# Patient Record
Sex: Female | Born: 1950 | Race: Black or African American | Hispanic: No | Marital: Married | Smoking: Current every day smoker
Health system: Southern US, Community
[De-identification: ages and names within clinical notes are randomized; demographics above are authoritative.]

## PROBLEM LIST (undated history)

## (undated) ENCOUNTER — Emergency Department (HOSPITAL_COMMUNITY): Payer: Medicare Other | Source: Home / Self Care

## (undated) DIAGNOSIS — Z9889 Other specified postprocedural states: Secondary | ICD-10-CM

## (undated) DIAGNOSIS — Z87442 Personal history of urinary calculi: Secondary | ICD-10-CM

## (undated) DIAGNOSIS — M1990 Unspecified osteoarthritis, unspecified site: Secondary | ICD-10-CM

## (undated) DIAGNOSIS — R112 Nausea with vomiting, unspecified: Secondary | ICD-10-CM

## (undated) DIAGNOSIS — T8859XA Other complications of anesthesia, initial encounter: Secondary | ICD-10-CM

## (undated) DIAGNOSIS — J189 Pneumonia, unspecified organism: Secondary | ICD-10-CM

## (undated) DIAGNOSIS — T4145XA Adverse effect of unspecified anesthetic, initial encounter: Secondary | ICD-10-CM

## (undated) DIAGNOSIS — T7840XA Allergy, unspecified, initial encounter: Secondary | ICD-10-CM

## (undated) HISTORY — PX: TONSILLECTOMY: SUR1361

## (undated) HISTORY — PX: TUBAL LIGATION: SHX77

## (undated) HISTORY — DX: Allergy, unspecified, initial encounter: T78.40XA

## (undated) HISTORY — PX: SPINE SURGERY: SHX786

## (undated) HISTORY — PX: CYST EXCISION: SHX5701

## (undated) NOTE — Unmapped External Note (Signed)
 Formatting of this note might be different from the original. 2120--[employer] called in regards to this patient, call transferred to Dr. Cherly Electronically signed by Tillman Minder at 05/04/2024  6:21 PM PDT

## (undated) NOTE — Progress Notes (Signed)
 Formatting of this note is different from the original. HOSPITALIST PROGRESS NOTE  Name:   Julie Obrien MRN:   [id number]  DOB:   1951/07/07 Acct:   [id number]  Age:   86 y.o. Admit Date:   05/04/2024  8:42 PM  Sex:   female Attending:   Gordy JONETTA Blanch, DO    Location:   [facility] TELEMET[facility]   Length of Stay: 0  Date of service: 05/05/2024   Subjective   Patient seen at bedside. She states that she currently has no numbness or tingling in her extremities.  Upon further enquiry, she states that she occasionally experiences right hip pain as well as tightness in the region of her right hip and right buttock. She is an active smoker. She denies having vertigo, chest pain, dizziness, headache or fever.  Current Medications:    Scheduled Meds   aspirin  81 mg Oral Daily   atorvastatin   40 mg Oral HS   enoxaparin  (LOVENOX ) injection  40 mg Subcutaneous Q24H SCH   pantoprazole   40 mg Oral QAM AC   Infusing Meds   PRN Meds  acetaminophen , 650 mg, Q6H PRN polyethylene glycol, 17 g, Daily PRN     Objective   Vitals    Current   24 Hour Min / Max  Temp 97.9 F (36.6 C) Temp  Min: 97.9 F (36.6 C)  Max: 98.1 F (36.7 C)  BP 115/67  BP  Min: 115/67  Max: 165/86  Pulse 73 Pulse  Min: 58  Max: 73  Resp 18 Resp  Min: 15  Max: 20  SpO2 93 % SpO2  Min: 93 %  Max: 97 %  Weight 80.1 kg (176 lb 9.4 oz) Initial Weight 80.1 kg (176 lb 9.4 oz)                    Body mass index is 33.37 kg/m.   Physical Examination   Physical Exam Constitutional:      General: She is not in acute distress.    Appearance: Normal appearance. She is obese. She is not ill-appearing.  HENT:     Head: Normocephalic and atraumatic.     Nose: Nose normal.     Mouth/Throat:     Mouth: Mucous membranes are moist.     Pharynx: Oropharynx is clear.  Eyes:     General: No scleral icterus.    Extraocular Movements: Extraocular movements intact.     Conjunctiva/sclera: Conjunctivae normal.     Pupils: Pupils  are equal, round, and reactive to light.  Neck:     Vascular: No carotid bruit.  Cardiovascular:     Rate and Rhythm: Normal rate and regular rhythm.     Pulses: Normal pulses.     Heart sounds: Normal heart sounds. No murmur heard.    No friction rub. No gallop.  Pulmonary:     Effort: Pulmonary effort is normal. No respiratory distress.     Breath sounds: Normal breath sounds. No stridor. No wheezing, rhonchi or rales.  Chest:     Chest wall: No tenderness.  Abdominal:     General: Bowel sounds are normal. There is no distension.     Palpations: Abdomen is soft. There is no mass.     Tenderness: There is no abdominal tenderness. There is no right CVA tenderness, left CVA tenderness, guarding or rebound.     Hernia: No hernia is present.  Musculoskeletal:        General: No  swelling, tenderness, deformity or signs of injury. Normal range of motion.     Cervical back: Normal range of motion and neck supple. No rigidity or tenderness.     Right lower leg: No edema.     Left lower leg: No edema.  Lymphadenopathy:     Cervical: No cervical adenopathy.  Skin:    General: Skin is warm.     Capillary Refill: Capillary refill takes less than 2 seconds.     Coloration: Skin is not jaundiced or pale.     Findings: No bruising or erythema.  Neurological:     General: No focal deficit present.     Mental Status: She is alert. Mental status is at baseline. She is disoriented.     Cranial Nerves: No cranial nerve deficit.     Sensory: No sensory deficit.     Motor: No weakness.     Coordination: Coordination normal.     Gait: Gait normal.  Psychiatric:        Mood and Affect: Mood normal.        Behavior: Behavior normal.        Thought Content: Thought content normal.        Judgment: Judgment normal.     Select Labs (last 7 days):     ABG   Anemia   CBC  Results from last 7 days  Lab Units 05/04/24 2213  WBC AUTO 10*3/uL 8.50  RBC AUTO 10*6/uL 4.63  HEMOGLOBIN g/dL 87.2   HEMATOCRIT % 62.4  MCV fL 81.1*  MCH pg 27.4*  MCHC g/dL 66.2  RDW % 85.3  PLATELETS AUTO 10*3/uL 275  MPV fL 7.7  NEUTROPHILS RELATIVE % 48.0  LYMPHOCYTES RELATIVE % 45.0  MONOCYTES RELATIVE % 4.9  EOSINOPHILS RELATIVE % 1.5  BASOPHILS RELATIVE % 0.6  NEUTROS ABS 10*3/uL 4.1  LYMPHS ABS AUTO 10*3/uL 3.8*  MONOS ABS AUTO 10*3/uL 0.4  EOS ABS AUTO 10*3/uL 0.1  BASOS ABS AUTO 10*3/uL 0.1   CMP  Results from last 7 days  Lab Units 05/04/24 2323  SODIUM mmol/L 139  POTASSIUM mmol/L 3.8  CHLORIDE mmol/L 103  CO2 mmol/L 24.0  BUN mg/dL 8.0  CREATININE mg/dL 9.39  BUN / CREAT RATIO  13.3  GLOMERULAR FILTRATION RATE mL/min/1.65m*2 >90.0  AST U/L 20  ALT U/L 16  ALK PHOS U/L 94  ALBUMIN  g/dL 4.0  GLOBULIN gm/dL 2.6  BILIRUBIN TOTAL mg/dL 0.4  CALCIUM  UA mg/dL 9.1   Results from last 7 days  Lab Units 05/04/24 2323 05/04/24 2048  POC GLUCOSE mg/dL  --  894  GLUCOSE mg/dL 94  --    Cardiac   Coagulation  Results from last 7 days  Lab Units 05/04/24 2216  PROTIME seconds 10.5*  INR  0.96  PTT seconds 29.7   Thyroid  Function / HGBA1C   Urine  Results from last 7 days  Lab Units 05/04/24 2239  COLOR UA  Straw  CLARITY UA  Clear  SPEC GRAV UA  1.020  PH UA  6.0  LEUKOCYTES UA  Negative  NITRITE UA  Negative  PROTEIN UA  Negative  GLUCOSE UA  Negative  UROBILINOGEN UA mg/dL Normal  BILIRUBIN UA  Negative  BLOOD UA  Negative  KETONES UA  Negative    Micro  No results found for: BLOODCX, CATHETERCX, CLOSTD, GRAMSTAIN, MRSACULTURE, RESPCULTURE, SPUTUMCULTUR, URINECX    Imaging  X-ray Chest 1 View  Result Date: 05/04/2024 1.  Minimal left basilar atelectasis/infiltrate/scarring. Electronically signed by:  Nelwyn Hang MD  05/04/2024 11:57 PM EDT RP Workstation: [id number]  CT Head Stroke without Contrast  Addendum Date: 05/04/2024   ADDENDUM Addendum Critical results discussed with and read back by Emmitt Slice, M.D.  on  05/04/2021  at 1920 hours PST. Electronically signed by:  Nelwyn Hang MD  05/04/2024 09:22 PM EDT RP Workstation: [id number]      Assessment & Plan   Hospital Problem List: Principal Problem:   TIA (transient ischemic attack) Active Problems:   Radiculopathy   CVA (cerebral vascular accident) (CMS/HCC)  TIA Stroke mimic (radiculopathy) Normal vital and labs CT head negative for acute findings  EKG with sinus bradycardia Admitted under observation for stroke workup Stroke protocol initiated Neurology consulted, recs appreciated. Passed nursing swallow eval Aspirin 81 mg daily Lipitor 40 mg daily Pain management Tylenol . Plan: MRI of the head pending.  Smoking cessation: Counseled extensively on smoking cessation.  Chronic GERD Continue Protonix  40 mg daily  DVT prophylaxis:  Lovenox  40 mg [place] daily GI prophylaxis:  Protonix  40 mg p.o. daily  Author: ADOMAKO, STEFAN, MD   Electronically signed by Gordy JONETTA Blanch, DO at 05/14/2024  9:29 AM PDT  Associated attestation - Blanch Gordy JONETTA, DO - 05/14/2024  9:29 AM PDT Formatting of this note might be different from the original. The patient was seen and examined with the Resident/NP/PA. Vitals, labs, medications, chart and imaging were reviewed. Discussed care plan and directed management Agree with Resident/PA/NP's assessment and plan as documented, with the following corrections and additions listed below.  #Advance care planning Disease education conducted, care plan discussed, diagnoses discussed, prognosis discussed, patient is full code, patient acknowledges understanding and agree with care plan, discussed about patient clinical course and answered all questions to satisfaction. +30 minutes.

## (undated) NOTE — ED Notes (Signed)
 Formatting of this note might be different from the original. Family updated as to patient's status. Electronically signed by Awilda Balo, RN at 05/04/2024  5:55 PM PDT

## (undated) NOTE — Unmapped External Note (Signed)
 Formatting of this note might be different from the original. 2054--Dr. Fanale (tele neuro) called for Dr. Cherly, call transferred Electronically signed by Tillman Minder at 05/04/2024  5:55 PM PDT

## (undated) NOTE — Unmapped External Note (Signed)
 Formatting of this note might be different from the original. 2042--Code stroke was called per the Charge nurse 2046 Code stroke was entered into blue sky, had to wait to find out who the patient was from the charge nurse Electronically signed by Tillman Minder at 05/04/2024  5:47 PM PDT

## (undated) NOTE — ED Notes (Signed)
 Formatting of this note might be different from the original. Patient is back from CT scan. Electronically signed by Awilda Balo, RN at 05/04/2024  6:22 PM PDT

## (undated) NOTE — Consults (Signed)
 Formatting of this note might be different from the original. [facility] Neuro Note  # Demographics Consult Type: Acute Stroke Level 1 (0-4.5 hrs)  Patient Location: Emergency Room  First Name: Julie  Last Name: Obrien  Date of Birth: 10/05/1950  Age: 89  Gender: Female  Facility: [facility]  Time of Initial Page [place] Time): 05/04/2024 20:46  First Contact with Site (Eastern Time): 05/04/2024 20:47  # HPI History: 72yo female with a history of multiple back and neck surgeries who presents for evaluation of persistent headache and new-onset right-sided numbness and weakness. The headache began on Friday prior to traveling from [place]  to [place]  and has persisted since arrival.  Today, she developed numbness in her right arm and leg, as well as twitching above the right elbow, which she reports as a new symptom and has never experienced before. She was able to transfer from a chair to a wheelchair with assistance but demonstrated noticeable right-sided weakness. She attempted to alleviate her symptoms with a heating pad and Tylenol  Arthritis, but neither provided relief.  Last Known Normal: saturday during the night some time  Duration: - constant  Associated Symptoms: - no double vision - no dizziness  # Scores Time of exam and NIHSS (Eastern Time): 05/04/2024 20:47  Level of Consciousness 1a: [0] = Alert; keenly responsive  LOC Questions 1b: [0] = Answers both questions correctly  LOC Commands 1c: [0] = Performs both tasks correctly  Best Gaze 2: [0] = Normal  Visual 3: [0] = No visual loss  Facial Palsy 4: [0] = Normal symmetrical movements  Motor Arm Left 5a: [0] = No drift  Motor Arm Right 5b: [0] = No drift  Motor Leg Left 6a: [0] = No drift  Motor Leg Right 6b: [0] = No drift  Limb Ataxia 7: [0] = Absent  Sensory 8: [1] = Mild-to-moderate sensory loss  Best Language 9: [0] = No aphasia  Dysarthria 10:  [0] = Normal  Extinction and Inattention 11: [0] = No abnormality  NIHSS Total: 1  # Exam Time of Exam (Eastern Time): 05/04/2024 20:47  Vitals: vital signs reviewed  SBP: 165  DBP: 86  # ROS Cardiovascular: - no chest pain - no palpitations  # PMH-FH-SH Past Medical History: - hyperlipidemia back pain  Social History: - smoker - non-drinker  Medications: - No antithrombotics or anticoagulants reported  # Assessment Impression: - Ischemic Stroke (Acute)  # Plan Thrombolytic/Intervention: NOT IV Thrombolysis or IA Intervention candidate  Thrombolytic Exclusion: > 4.5 hours  Intraarterial Exclusion: - clinical exam not consistent with presence of large vessel occlusion (LVO), can reconsider if LVO found on vascular imaging  Target Blood Pressure: SBP < 220  Labs: - lipid panel  Imaging: (urgency: STAT): - CT Angiogram Head and CT Angiogram Neck AND call back with results if abnormal  Imaging: (urgency: routine): - MRI Brain without contrast  Diagnostic Test: - echo without bubble study  Therapy/Evaluation: - PT/OT evaluation - speech/swallow consultation - NPO until swallow evaluation  Medication: - aspirin 81 mg daily  Other: - If patient has any neurological deterioration please call me back immediately - LDL < 70 - telemetry monitoring - I have discussed my recommendations with the referring provider - permissive hypertension  Disposition: admit  # Logistics Attestation of consult completion: The patient is located at: [facility]. Facility staff participated in the visit. I performed this telemedicine visit from my offsite office utilizing interactive 2 way  audio and Primary school teacher at the request of the onsite emergency room provider.  # Demographics First Name: Julie  Last Name: Obrien  Facility: [facility]   Electronically signed by Lonni Delsa GAILS, MD at  05/04/2024  5:54 PM PDT

## (undated) NOTE — Unmapped External Note (Signed)
 Formatting of this note might be different from the original.  Problem: Pain Goal: Patient's pain/discomfort is manageable Description: Assess and monitor patient's pain using appropriate pain scale. Collaborate with interdisciplinary team and initiate plan and interventions as ordered. Re-assess patient's pain level 30 - 60 minutes after pain management intervention.  Outcome: Progressing  Problem: Safety Goal: Patient will be injury free during hospitalization Description: Assess and monitor vitals signs, neurological status including level of consciousness and orientation. Assess patient's risk for falls and implement fall prevention plan of care and interventions per hospital policy.   Ensure arm band on, uncluttered walking paths in room, adequate room lighting, call light and overbed table within reach, bed in low position, wheels locked, side rails up per policy, and non-skid footwear provided.  Outcome: Progressing  Problem: Daily Care Goal: Daily care needs are met Description: Assess and monitor ability to perform self care and identify potential discharge needs. Outcome: Progressing  Problem: Psychosocial Needs Goal: Demonstrates ability to cope with hospitalization/illness Description: Assess and monitor patients ability to cope with his/her illness. Outcome: Progressing Goal: Collaborate with patient/family/caregiver to identify patient specific goals for this hospitalization Outcome: Progressing  Electronically signed by Heron Cleverly, RN at 05/05/2024 12:18 AM PDT

## (undated) NOTE — Nursing Note (Signed)
 Formatting of this note might be different from the original. Pt MRI screening form is complete and is in the pts chart. Will continue with plan of care  Electronically signed by Ashley Pickett, LVN at 05/05/2024  8:57 AM PDT

## (undated) NOTE — Consults (Signed)
 Formatting of this note might be different from the original. Teleneurology: Subjective  History of Present Illness Julie Obrien, a patient visiting from [place] , presents with a chief complaint of a right-sided headache accompanied by right-sided twitching and right foot numbness with tingling around the ankle. The patient reports that these symptoms occurred recently and have since improved.  The patient describes the onset of symptoms as a transient neurological event. The headache was localized to the right side, with associated right-sided twitching. The patient also experienced numbness in the right foot, specifically noting tingling around the ankle area. The duration and severity of these symptoms were not explicitly stated, but the patient confirms that the symptoms have improved since their occurrence.  Julie Obrien denies any history of regular headaches or other neurological symptoms such as dizziness, spinning sensations, neck pain, or back pain. The patient also denies any left-sided problems. There is no reported history of diabetes. The patient mentions being in town to visit family for Thanksgiving, suggesting recent travel which may be relevant to the current health situation.  Social History: 1. Living Situation: Visiting daughter and grandchildren 2. Residence: Lives in [place], [place]   Review of Systems: 1. HEENT: Positive for headache. 2. Neurological: Positive for right-sided twitching, right foot numbness, tingling around the ankle. Negative for dizziness, spinning sensation. 3. Musculoskeletal: Negative for neck pain, back pain.  Objective  Physical Examination Neurological: Finger-to-nose test unremarkable bilaterally. No satelliting observed. No drift noted. Gait unremarkable as patient walked to the bed.  Laboratory, Imaging, and Diagnostic Test Results: - CT angiogram: Slight narrowing of blood vessels on the right side, overall good blood flow to the brain - CT  scan of the brain: Looks good  Assessment & Plan  Julie Obrien is a patient visiting from [place]  presenting with right-sided headache, right-sided twitching, and right foot numbness and tingling around the ankle.  Transient Neurological Symptoms Assessment: Patient experienced transient neurological symptoms including right-sided headache, right-sided twitching, and right foot numbness with tingling around the ankle. Symptoms have improved since onset. CT angiogram showed slight narrowing of blood vessels on the right side, which does not correlate with the patient's right-sided symptoms. CT scan of the brain was reported as normal. Given the patient's age and symptom presentation, a transient ischemic attack (TIA) was considered in the differential diagnosis. However, imaging studies do not fully support this diagnosis. Neurological exam, including finger-to-nose test and gait assessment, was unremarkable. Plan: 1) Perform MRI of the brain 2) Continue all home medications 3) Pending normal MRI results, consider discharge this evening 4) Follow up with primary care physician in [place]  for further evaluation and management  Dr. Billy ( Total Time 15 minutes )   Electronically signed by Filiberto Billy GAILS, MD at 05/05/2024  6:26 AM PDT

## (undated) NOTE — Unmapped External Note (Signed)
 Formatting of this note might be different from the original. CM attempted to do IA ,patient is off the unit at this time . Will try again later. Electronically signed by Creighton Pares at 05/05/2024  1:01 PM PDT

## (undated) NOTE — ED Notes (Signed)
 Formatting of this note might be different from the original. Sent for CT Electronically signed by Awilda Balo, RN at 05/04/2024  5:55 PM PDT

## (undated) NOTE — H&P (Signed)
 Formatting of this note is different from the original. History & Physical   Name:  Julie Obrien MRN:  [id number]   DOB:  Feb 14, 1951 Acct:  [id number]   Age:  50 y.o. Admit Date:  05/04/2024  8:42 PM  Sex:  female  Attending:  Adriana JINNY Serge, MD  PCP: PCP, LENNOX, MD Location:  [facility]   Information Obtained From:    patient  Chief Complaint:      Right foot numbness Chief Complaint  Patient presents with   Right foot numbness    History of Present Illness   Julie Obrien is a 60 y.o. female with PMHx of hyperlipidemia, GERD, chronic back pain, prior back and neck surgeries, and right hip surgery who presents with acute right foot numbness. She reports that on Friday she developed a headache followed by transient pain in her right hand, which resolved spontaneously.  Subsequently she began to notice weakness in her right leg along with numbness in her right foot.  She endorses chronic baseline weakness of her right leg since the prior back and neck surgeries, but states the current numbness was new.  She denies slurred speech, facial droop, vision changes, dizziness, loss of consciousness, or chest pain.  Patient reports mild intermittent dry cough which she states is seasonal allergies. At the time of my encounter the patient denies headache weakness or numbness of right side.  Patient endorses home meds include atorvastatin  40 mg daily and a PPI for GERD.  Patient History  Medical:  Hyperlipidemia, chronic back pain, GERD  Surgical:  Laminectomy with posterior lateral arthrodesis                                       2022 Right hip surgery  Family:  No family history on file.  No family status information on file.   Social:  She  reports that she has been smoking cigarettes. She does not have any smokeless tobacco history on file. She reports that she does not drink alcohol and does not use drugs.     Prescriptions Prior to Arrival   No medications prior to admission.     Allergies  No Known Allergies    Review of Systems  Constitutional:  Negative for fever or chills,   HENT:  Negative for sore throat or rhinorrhea  Eyes:  Negative for pain or redness  Respiratory:  Negative for cough or SOB  Cardiovascular:  Negative for chest pain or palpitations  Gastrointestinal:  Negative for nausea or vomiting  Genitourinary:  Negative for dysuria or hematuria  Musculoskeletal:  Negative for acute back or neck pain  Skin:  Negative for rash or pruritus  Heme: No abnormal bruising, no abnormal bleeding  Neurological: Positive for headache (now resolved), transient right hand pain, right foot numbness, right leg weakness. Negative for acute numbness or weakness     Physical Examination  General:  Alert, cooperative, no acute distress  HEENT:   Normocephalic, atraumatic, bilateral external ears normal, PERRLA, conjunctiva/corneas clear, mucous membranes normal limits, oropharynx moist, no oral exudates  Neck:  Normal range of motion, supple, no tenderness, no JVD, no lymphadenopathy  Respiratory:  Normal rate, normal air flow, no respiratory distress, no retractions, no wheezes, no rhonchi, no rales  Heart:   Regular rate and rhythm, normal S1,S2, no gallops, no murmurs, no rubs  Abdomen:  Soft, no tenderness, no guarding, no  rebound  GI:   Bowel sounds normal, no masses, no pulsatile masses  Musc/Skel:  Intact distal pulses, no tenderness, no cyanosis  Skin:   No rash or open wounds  Extremities:   Bilateral pedal pulse +2, no edema  Neurologic:   Alert & oriented x 4. 5/5 strength in left UE and LE. 5/5 in right UE. 4/5 in right LL. Normal sensory function, no focal deficits     Vitals     Current    24 Hour Min / Max  Temp  98.1 F (36.7 C) Temp  Min: 98.1 F (36.7 C)  Max: 98.1 F (36.7 C)  BP  (!) 125/58 BP  Min: 125/58  Max: 165/86  Pulse  58 Pulse  Min: 58  Max: 66  Resp  18 Resp  Min: 15  Max: 20  SpO2 95 % SpO2  Min: 93 %  Max: 96 %   Weight 80.1 kg (176 lb 9.4 oz) Initial Weight 80.1 kg (176 lb 9.4 oz)                   Body mass index is 33.37 kg/m.    Current Medications:    Scheduled Meds   Infusing Meds   No current facility-administered medications for this encounter.   PRN Meds    Intake / Output  I/O     None                       Net IO Since Admission: No IO data has been entered for this period [05/05/24 0341]    Vent Settings     Select Labs (last 7 days):     ABG   Anemia   CBC  Results from last 7 days  Lab Units 05/04/24 2213  WBC AUTO 10*3/uL 8.50  RBC AUTO 10*6/uL 4.63  HEMOGLOBIN g/dL 87.2  HEMATOCRIT % 62.4  MCV fL 81.1*  MCH pg 27.4*  MCHC g/dL 66.2  RDW % 85.3  PLATELETS AUTO 10*3/uL 275  MPV fL 7.7  NEUTROPHILS RELATIVE % 48.0  LYMPHOCYTES RELATIVE % 45.0  MONOCYTES RELATIVE % 4.9  EOSINOPHILS RELATIVE % 1.5  BASOPHILS RELATIVE % 0.6  NEUTROS ABS 10*3/uL 4.1  LYMPHS ABS AUTO 10*3/uL 3.8*  MONOS ABS AUTO 10*3/uL 0.4  EOS ABS AUTO 10*3/uL 0.1  BASOS ABS AUTO 10*3/uL 0.1   CMP  Results from last 7 days  Lab Units 05/04/24 2323  SODIUM mmol/L 139  POTASSIUM mmol/L 3.8  CHLORIDE mmol/L 103  CO2 mmol/L 24.0  BUN mg/dL 8.0  CREATININE mg/dL 9.39  BUN / CREAT RATIO  13.3  GLOMERULAR FILTRATION RATE mL/min/1.43m*2 >90.0  AST U/L 20  ALT U/L 16  ALK PHOS U/L 94  ALBUMIN  g/dL 4.0  GLOBULIN gm/dL 2.6  BILIRUBIN TOTAL mg/dL 0.4  CALCIUM  UA mg/dL 9.1   Results from last 7 days  Lab Units 05/04/24 2323 05/04/24 2048  POC GLUCOSE mg/dL  --  894  GLUCOSE mg/dL 94  --    Cardiac   Coagulation  Results from last 7 days  Lab Units 05/04/24 2216  PROTIME seconds 10.5*  INR  0.96  PTT seconds 29.7   Drug Tox Screen   Thyroid  Function / HGBA1C   Urine  Results from last 7 days  Lab Units 05/04/24 2239  COLOR UA  Straw  CLARITY UA  Clear  SPEC GRAV UA  1.020  PH UA  6.0  LEUKOCYTES UA  Negative  NITRITE UA  Negative  PROTEIN UA  Negative   GLUCOSE UA  Negative  UROBILINOGEN UA mg/dL Normal  BILIRUBIN UA  Negative  BLOOD UA  Negative  KETONES UA  Negative    COVID-19  No results found for requested labs within last 30 days.     Imaging  X-ray Chest 1 View  Result Date: 05/04/2024 Procedure:  XR CHEST 1 VIEW Indication:  Stroke. Technique:  One view. Prior relevant exam:  None. Findings: Limitations: None Tubes/Lines/Catheters: None Hemidiaphragms: Normal Lungs: Minimal left basilar atelectasis/infiltrate/scarring. Heart: Normal size Mediastinum: Normal size without mass Vascularity: Normal Midline: No abnormal mass or soft tissue density. No tracheal deviation. Pneumothorax: None Soft tissues: Normal Skeletal: Nothing acute Misc: NA   1.  Minimal left basilar atelectasis/infiltrate/scarring. Electronically signed by:  Nelwyn Hang MD  05/04/2024 11:57 PM EDT RP Workstation: [id number]  CT STROKE Angiogram Head Neck with and/or without Contrast  Result Date: 05/04/2024 Procedure: CT STROKE ANGIOGRAM HEAD NECK W AND/OR WO CONTRAST Indication: CVA. Mental status changes. . Technique: CT angiogram of the head and neck performed in arterial phase after administration of 100 cc Omnipaque  356  IV. Multiplanar reconstructions. At a separate computer workstation, computer-generated multiplanar maximum intensity projections, 3d rotating surface shaded rendered views, and 3d rotating maximum intensity projections. Protocol also includes initial noncontrast head CT. Stenosis measurement and validation using NASCET technique. This exam was performed according to our departmental dose-optimization program, which includes automated exposure control, adjustment of the mA and/or kV according to patient size and/or use of iterative reconstruction technique. The VIZ A.I. study has been analyzed with a deep learning artificial intelligence algorithm for Large Vessel Occlusion Detection. Prior Relevant Exam: None Findings: Limitations: NA  Noncontrast CT Brain: No acute brain parenchymal abnormality Postcontrast CT Brain: No enhancing mass or lesion CTA HEAD Intracranial ICAs: Mild calcification bilaterally . ACAs: Normal. MCAs: Areas of mild narrowing involving the right M1 segment of the middle cerebral artery, not hemodynamically significant. SABRA PCAs: Normal. Basilar artery: Normal. Distal vertebral arteries: Normal. CTA NECK Right side Right CCA: Normal. Right carotid bulb: Mild calcification . Right cervical ICA: Normal. Right cervical vertebral artery: Normal. Left side Left CCA: Normal. Left carotid bulb: Mild calcification . Left cervical ICA: Normal. Left cervical vertebral artery: Normal. Misc: NA CONCLUSION: 1.  Areas of mild narrowing involving the right M1 segment of the middle cerebral artery, not hemodynamically significant. 2.  No evidence of intraluminal thrombus, aneurysm, or dissection. Electronically signed by:  Nelwyn Hang MD  05/04/2024 09:44 PM EDT RP Workstation: [id number]  CT Head Stroke without Contrast  Addendum Date: 05/04/2024   ADDENDUM Addendum Critical results discussed with and read back by Emmitt Slice, M.D.  on 05/04/2021  at 1920 hours PST. Electronically signed by:  Nelwyn Hang MD  05/04/2024 09:22 PM EDT RP Workstation: [id number]   Result Date: 05/04/2024 Procedure: CT HEAD STROKE PROTOCOL INDICATION: Mental status changes. Technique: Axial, coronal, sagittal, without contrast. This exam was performed according to our departmental dose-optimization program which includes automated exposure control, adjustment of the mA and/or kV according to patient size and/or use of iterative reconstruction technique. Prior relevant examination: None Findings: Limitations: None Mild central and cortical atrophy is noted. No evidence of acute brain parenchymal abnormality is noted. Ventricles are within normal limits in size. No mass-effect or midline shift is noted. No evidence of acute hemorrhage is noted.  The mastoid air cells are well aerated bilaterally. The visualized paranasal sinuses appear well-aerated bilaterally. Calvarium intact.  CONCLUSION: 1. No evidence of acute brain parenchymal abnormality. 2. ASPECT Score = 10. Electronically signed by:  Nelwyn Hang MD  05/04/2024 09:17 PM EDT RP Workstation: [id number]   Assessment & Plan   Hospital Problem List: Principal Problem:   TIA (transient ischemic attack) Active Problems:   Radiculopathy  TIA Stroke mimic (radiculopathy) Normal vital and labs CT head negative for acute findings  EKG with sinus bradycardia Admit under observation for stroke workup Stroke protocol initiated Neurology consulted Passed nursing swallow eval Aspirin 81 mg daily Lipitor 40 mg daily MRI in a.m. Neuro checks q.6 hours Continuous telemetry PT/OT for mobility and baseline functional assessment Pain management Tylenol  Check TSH, Vitamin B12, MRI cervical and lumbar spine   Chronic GERD Continue Protonix  40 mg daily  DVT prophylaxis:  Lovenox  40 mg [place] daily GI prophylaxis:  Protonix  40 mg p.o. daily  Summary: 38 year-old female with PMHx of HLD, GERD, chronic back pain, presenting with acute neurologic symptoms; headache and and pain now resolved. Ongoing right foot numbness, and subjective worsening of chronic right leg weakness.  CT head negative for acute intracranial process.  Presentation concerning for TIA versus radiculopathy.  Patient now admitted under observation for stroke workup.    Author:   RINALDO ARNETTA BIRCH, MD   Electronically signed by Adriana JINNY Serge, MD at 05/05/2024  9:50 AM PDT  Associated attestation - Serge Adriana JINNY, MD - 05/05/2024  9:50 AM PDT Formatting of this note is different from the original. The patient was seen and examined with the NP/PA. Vitals, labs, medications, chart and imaging were reviewed. Discussed care plan and directed management Agree with NP's assessment and plan as documented,  with the following corrections and additions.  I  spent 50 minutes with the patient w/ >50% of the time spent counseling and/or coordinating care for this patient. Counseling topics and/or how time was spent coordinating patient?s care is outlined in the impression and plan above. Advanced care planning conducted. Pt instructed to F/U with PCP within 1 wk.

## (undated) NOTE — Nursing Note (Signed)
 Formatting of this note might be different from the original. Pt went AMA. States there is no medical reason for her to be in the hospital. IV removed and tele box return to the monitor room.  Electronically signed by Ashley Pickett, LVN at 05/05/2024  2:19 PM PDT

## (undated) NOTE — Unmapped External Note (Signed)
 Formatting of this note might be different from the original. 2049--Dr. Fanale came across the monitor, gave the option of speaking to the Dr. Or the patient first whom was in the room, Dr. Delsa chose the patient first.  Wheeled him into the room. Electronically signed by Tillman Minder at 05/04/2024  5:51 PM PDT

## (undated) NOTE — ED Notes (Signed)
 Formatting of this note might be different from the original. Neurology exam in progress, daughter at bedside  Electronically signed by Awilda Balo, RN at 05/04/2024  5:51 PM PDT

## (undated) NOTE — Unmapped External Note (Signed)
 Formatting of this note might be different from the original. 2047--Dr. Delsa Tele Neuro accepted the Code Stroke via [facility] Electronically signed by Tillman Minder at 05/04/2024  5:50 PM PDT

## (undated) NOTE — ED Provider Notes (Signed)
 Formatting of this note is different from the original. Emergency Department Encounter Name:   Julie Obrien MRN:   [id number]  DOB:   09-Dec-1950 Acct:   [id number]  Age:   22 y.o. Arrival:   05/04/24 at 2016  Sex:   female ED Provider:   Emmitt DELENA Slice, MD  PCP:  PCP, LENNOX, MD Location:   [facility] EMERGENCY   Chief Complaint Chief Complaint  Patient presents with   Cerebrovascular Accident   HPI 75 y.o. female presents to the emergency room with complaint of right-sided ankle/foot numbness .  Patient claims numbness associated with slight weakness which started about 8 hours prior to presentation to the emergency room.  Patient denies any loss of consciousness.  Denies trauma denies chest pain, shortness of breath or any other constitutional symptoms.  Code Status:   Reviewed with patient and/or family as full code.  ROS Review of Systems  Constitutional: Negative.   HENT: Negative.    Eyes: Negative.   Respiratory: Negative.    Cardiovascular: Negative.   Gastrointestinal: Negative.   Endocrine: Negative.   Musculoskeletal: Negative.   Skin: Negative.   Allergic/Immunologic: Negative.   Neurological:  Positive for numbness.  Hematological: Negative.   Psychiatric/Behavioral: Negative.    All other systems reviewed and are negative. Genitourinary: Negative.     PHYSICAL EXAM BP 136/70   Pulse 65   Temp 98.1 F (36.7 C)   Resp 20   SpO2 95%  Vitals:   05/04/24 2136 05/04/24 2140 05/04/24 2145 05/04/24 2200  BP: 129/64 129/64 128/74 136/70  BP Location:      Patient Position:      Pulse:  60 60 65  Resp:  20 18 20   Temp:  98.1 F (36.7 C)    SpO2:  95% 95% 95%   Physical Exam Vitals and nursing note reviewed.  Constitutional:      Appearance: Normal appearance.  HENT:     Head: Normocephalic and atraumatic.     Nose: Nose normal.  Eyes:     Extraocular Movements: Extraocular movements intact.     Pupils: Pupils are equal, round, and reactive to light.   Cardiovascular:     Rate and Rhythm: Normal rate and regular rhythm.  Pulmonary:     Effort: Pulmonary effort is normal.     Breath sounds: Normal breath sounds.  Abdominal:     General: Abdomen is flat. Bowel sounds are normal.     Palpations: Abdomen is soft.  Musculoskeletal:        General: Normal range of motion.     Cervical back: Normal range of motion and neck supple.  Skin:    General: Skin is warm.  Neurological:     Mental Status: She is alert.     GCS: GCS eye subscore is 4. GCS verbal subscore is 5. GCS motor subscore is 6.     Cranial Nerves: Cranial nerves 2-12 are intact.     Sensory: Sensory deficit present.     Motor: Weakness present.     Coordination: Coordination is intact.     Comments: NIH:1   PAST MEDICAL HISTORY No past medical history on file.  SURGICAL HISTORY No past surgical history on file.  CURRENT MEDICATIONS  Previous Medications   No medications on file   ALLERGIES No Known Allergies  FAMILY HISTORY No family history on file.  SOCIAL HISTORY Social History   Socioeconomic History   Marital status: Unknown    Spouse name: Not  on file   Number of children: Not on file   Years of education: Not on file   Highest education level: Not on file  Occupational History   Not on file  Tobacco Use   Smoking status: Not on file   Smokeless tobacco: Not on file  Substance and Sexual Activity   Alcohol use: Not on file   Drug use: Not on file   Sexual activity: Not on file  Other Topics Concern   Not on file  Social History Narrative   Not on file   Social Determinants of Health   Financial Resource Strain: Low Risk  (02/09/2024)   Received from [facility]   Overall Financial Resource Strain (CARDIA)    How hard is it for you to pay for the very basics like food, housing, medical care, and heating?: Not hard at all  Food Insecurity: No Food Insecurity (02/09/2024)   Received from [facility]   Hunger Vital Sign    Worried  About Running Out of Food in the Last Year: Never true    Ran Out of Food in the Last Year: Never true  Transportation Needs: No Transportation Needs (02/09/2024)   Received from [facility] - Transportation    In the past 12 months, has lack of transportation kept you from medical appointments or from getting medications?: No    In the past 12 months, has lack of transportation kept you from meetings, work, or from getting things needed for daily living?: No  Physical Activity: Inactive (02/09/2024)   Received from [facility] - Eat   Exercise Vital Sign    Days of Exercise per Week: 0 days    Minutes of Exercise per Session: Not on file  Stress: No Stress Concern Present (02/09/2024)   Received from [facility] - Occupational Stress Questionnaire    Do you feel stress - tense, restless, nervous, or anxious, or unable to sleep at night because your mind is troubled all the time - these days?: Not at all  Social Connections: Moderately Isolated (02/09/2024)   Received from [facility]   Social Connection and Isolation Panel [NHANES]    Frequency of Communication with Friends and Family: More than three times a week    Frequency of Social Gatherings with Friends and Family: More than three times a week    Attends Religious Services: Never    Database administrator or Organizations: No    Attends Engineer, structural: Not on file    Marital Status: Married  Catering manager Violence: Not At Risk (07/31/2023)   Received from [facility]   Humiliation, Afraid, Rape, and Kick questionnaire    Fear of Current or Ex-Partner: No    Emotionally Abused: No    Physically Abused: No    Sexually Abused: No  Housing Stability: Unknown (02/09/2024)   Received from [facility]   Housing Stability Vital Sign    Unable to Pay for Housing in the Last Year: No    Number of Times Moved in the Last Year: Not on file    Homeless in the Last Year: No    Labs Labs Reviewed  CBC W/ AUTO DIFF - Abnormal      Result Value   WBC 8.50     RBC 4.63     Hemoglobin 12.7     Hematocrit 37.5     MCV 81.1 (*)    MCH 27.4 (*)  MCHC 33.7     RDW 14.6     Platelets 275     MPV 7.7     nRBC 0.10     Neutrophils (Relative) 48.0     Lymphocytes (Relative) 45.0     Monocytes (Relative) 4.9     Eosinophils (Relative) 1.5     Basophils (Relative) 0.6     Neutrophils (Absolute) 4.1     Lymphocytes (Absolute) 3.8 (*)    Monocytes (Absolute) 0.4     Eosinophils (Absolute) 0.1     Basophils (Absolute) 0.1     MDW 19.56    PT AND PTT - Abnormal   PTT 29.7     Protime 10.5 (*)    INR 0.96    LIPID PANEL - Normal   Cholesterol 157     Triglycerides 65     HDL 64.0     LDL Direct 87     Cardiac Risk 2.5     VLDL 13     Narrative:                         NATIONAL CHOLESTEROL GUIDELINES NATIONAL HEART,LUNG,and BLOOD INSTITUTE(NHLBI) guidelines for classification, testing and management of cholesterol levels in adults over 28 years of age. This new classification creates three categories of risk for coronary heart disease,regardless of age or sex,according to total and LDL cholesterol levels.    Based on total cholesterol level  Desirable               <200 mg/dl Borderline-high       799-760 mg/dl High                    >=240 mg/dl       Based on cholesterol ratio   CHD RISK           CHOL/HDL RATIO                              FEMALE       FEMALE     0.5 x Average        3.4             3.3 1.0 x Average        5.0             4.4 2.0 x Average        9.6             7.1 3.0 x Average       13.5           11.0  URINALYSIS, W/ REFLEX TO MICRO AND CULT, IF INDICATED - Normal   Color, UA Straw     Clarity, UA Clear     Specific Gravity, UA 1.020     pH, UA 6.0     Protein, UA Negative     Glucose, UA Negative     Ketones, UA Negative     Blood, UA Negative     Bilirubin, UA Negative     Nitrite, UA Negative     Leukocyte  esterase, UA Negative     Urobilinogen, UA Normal    POCT GLUCOSE - Normal   POC Glucose 105    COMPREHENSIVE METABOLIC PANEL   Sodium 139     Potassium 3.8     Chloride 103     CO2 24.0  BUN 8.0     Creatinine 0.60     BUN/Creatinine Ratio 13.3     Glucose 94     Calcium  9.1     AST 20     ALT 16     Alkaline Phosphatase 94     Total Protein 6.6     Albumin  4.0     Total Bilirubin 0.4     A/G Ratio 1.5     Anion Gap 12.0     GLOBULIN 2.6     Glomerular Filtration Rate >90.0     Narrative:    STAGES OF CHRONIC KIDNEY DISEASE  STAGE      GFR           DESCRIPTION 1           90.0+         Normal Kidney function but urine findings or structural abnormalities or genetic trait point to kidney disease.  2           60.0-89.0     Mildly reduced kidney function, and other findings (as in stage 1) point to kidney disease.  3           30.0-59.0     Moderately reduced kidney function.  4           15.0-29.0     Severely reduced kidney function.  5           <15.0         Very severe, or endstage kidney failure.  COMPREHENSIVE METABOLIC PANEL  TYPE AND SCREEN   Radiology Imaging Results       X-ray Chest 1 View (Final result)  Result time 05/04/24 23:57:40    Final result by Nelwyn LELON Hang, MD (05/04/24 23:57:40)        Impression:   1.  Minimal left basilar atelectasis/infiltrate/scarring.  Electronically signed by:  Nelwyn Hang MD  05/04/2024 11:57 PM EDT RP Workstation: [id number]       Narrative:   Procedure:  XR CHEST 1 VIEW   Indication:  Stroke.  Technique:  One view.  Prior relevant exam:  None.  Findings:  Limitations: None  Tubes/Lines/Catheters: None  Hemidiaphragms: Normal  Lungs: Minimal left basilar atelectasis/infiltrate/scarring.   Heart: Normal size  Mediastinum: Normal size without mass  Vascularity: Normal  Midline: No abnormal mass or soft tissue density. No tracheal deviation.  Pneumothorax: None  Soft  tissues: Normal  Skeletal: Nothing acute  Misc: NA               CT STROKE Angiogram Head Neck with and/or without Contrast (Final result)  Result time 05/04/24 21:44:44    Final result by Nelwyn LELON Hang, MD (05/04/24 21:44:44)        Narrative:   Procedure: CT STROKE ANGIOGRAM HEAD NECK W AND/OR WO CONTRAST   Indication: CVA. Mental status changes. .  Technique: CT angiogram of the head and neck performed in arterial phase after administration of 100 cc Omnipaque  356  IV. Multiplanar reconstructions. At a separate computer workstation, computer-generated multiplanar maximum intensity projections, 3d rotating surface shaded rendered views, and 3d rotating maximum intensity projections. Protocol also includes initial noncontrast head CT. Stenosis measurement and validation using NASCET technique. This exam was performed according to our departmental dose-optimization program, which includes automated exposure control, adjustment of the mA and/or kV according to patient size and/or use of iterative reconstruction technique.  The VIZ A.I. study has been analyzed with a deep learning  artificial intelligence algorithm for Large Vessel Occlusion Detection.  Prior Relevant Exam: None  Findings:   Limitations: NA  Noncontrast CT Brain: No acute brain parenchymal abnormality  Postcontrast CT Brain: No enhancing mass or lesion  CTA HEAD  Intracranial ICAs: Mild calcification bilaterally . ACAs: Normal. MCAs: Areas of mild narrowing involving the right M1 segment of the middle cerebral artery, not hemodynamically significant. SABRA PCAs: Normal. Basilar artery: Normal. Distal vertebral arteries: Normal.  CTA NECK  Right side Right CCA: Normal. Right carotid bulb: Mild calcification . Right cervical ICA: Normal. Right cervical vertebral artery: Normal.  Left side Left CCA: Normal. Left carotid bulb: Mild calcification . Left cervical ICA: Normal. Left cervical  vertebral artery: Normal.  Misc: NA  CONCLUSION: 1.  Areas of mild narrowing involving the right M1 segment of the middle cerebral artery, not hemodynamically significant. 2.  No evidence of intraluminal thrombus, aneurysm, or dissection.  Electronically signed by:  Nelwyn Hang MD  05/04/2024 09:44 PM EDT RP Workstation: [phone]              CT Head Stroke without Contrast (Edited Result - FINAL)  Result time 05/04/24 21:22:10    Addendum (preliminary) 1 of 1 by Nelwyn LELON Hang, MD (05/04/24 21:22:10)    ADDENDUM  Addendum  Critical results discussed with and read back by Emmitt Slice, M.D.  on 05/04/2021  at 1920 hours PST.  Electronically signed by:  Nelwyn Hang MD  05/04/2024 09:22 PM EDT RP Workstation: [id number]        Final result by Nelwyn LELON Hang, MD (05/04/24 21:17:19)        Narrative:   Procedure: CT HEAD STROKE PROTOCOL   INDICATION: Mental status changes.  Technique: Axial, coronal, sagittal, without contrast. This exam was performed according to our departmental dose-optimization program which includes automated exposure control, adjustment of the mA and/or kV according to patient size and/or use of iterative reconstruction technique.  Prior relevant examination: None  Findings:  Limitations: None  Mild central and cortical atrophy is noted. No evidence of acute brain parenchymal abnormality is noted. Ventricles are within normal limits in size. No mass-effect or midline shift is noted. No evidence of acute hemorrhage is noted.   The mastoid air cells are well aerated bilaterally.   The visualized paranasal sinuses appear well-aerated bilaterally.  Calvarium intact.  CONCLUSION:  1. No evidence of acute brain parenchymal abnormality. 2. ASPECT Score = 10.  Electronically signed by:  Nelwyn Hang MD  05/04/2024 09:17 PM EDT RP Workstation: [id number]              ------------------------------------------------------------------------------------------- MEDICAL DECISION MAKING (MDM) AND ED COURSE Stroke MDM This patient presents with symptoms concerning for acute CVA versus TIA. Other items on the differential include dissection, AMI, hypoglycemia or other metabolic derangement such as hepatic/uremic encephalopathy, medication side effect, or post-ictal Todd?s paralysis. However, presentation most concerning for a CVA. EKG without evidence of STEMI or ischemia, labs with no hypoglycemia, metabolic derangements, and clinical picture does not suggest other stroke mimic. CT head showed no significant abnormalities at present  CTA head and neck showed no significant abnormalities. Per neuro patient will be admitted for CVA workup under the hospitalist service.SABRA     -------------------------------------------------------------------------------------------  1) Number and Complexity of Problems  Problem List This Visit:  Cerebrovascular Accident  Differential Diagnosis includes (but not limited to):  CVA, encephalitis,   Pertinent Comorbid Conditions:   See HPI, PMH and PSH  Complexity of Problem  This  patient has:  >2 problem(s) addressed during this visit  One of which includes: Acute illness/ injury with systemic or complicated features  2)  Data Reviewed (none if left blank)  External Documentation Reviewed: Yes, as necessary  Previous patient encounter documents & history available on EMR was reviewed: Yes  See Formal Diagnostic Results above for the lab and radiology tests and orders.   3)  Treatment and Disposition  Morbidity/ Mortality Of Patient Management  During this visit, the patient has received:  Drug therapy requiring intensive monitoring   Medications  iohexol (OMNIPAQUE) 350 MG/ML injection 100 mL (100 mL Intravenous Given 05/04/24 2120)    Disposition: Admit to telemetry  EKG  Twelve lead EKG  obtained at 2149  interpreted by me in the absence of a cardiologist. This tracing shows a sinus bradycardia.  There is no criteria ST-elevation or reciprocal change.  There are no hyperacute T-wave changes. There is no sign of acute ischemia or infarction.  HR,PR interval and QTC interval are 57/192/422 There is no acute change compared with most recent prior dated .  COMPLEXITY OF WORKUP   I have reviewed the results of each ordered lab result.    I have reviewed prior documentation from an outside department.   I have obtained a history from an independent historian (someone other than the patient).   I have not independently viewed and interpreted the patient's radiologic imaging.   I have not discussed the management with an external physician or APP as a Research scientist (medical).   PROCEDURES: Procedures  CRITICAL CARE STATEMENT (Delete if criteria not met 120) Due to a high probability of clinically significant, life threatening deterioration, the patient required my highest level of preparedness to intervene emergently.Total critical care time is 120 minutes.  This critical care time includes obtaining a history, examining the patient, vital sign monitoring, pulse oximetry monitoring, telemetry monitoring, clinical response to the IV medications, review nursing notes, documentation time, consultation time and interpretation of laboratory tests.This critical care time was performed to assess and manage the high probability  of imminent, life-threatening deterioration that could result in multi-organ failure. It was exclusive of separately billable procedures and treating other patients.   Please see MDM section and rest of the note for further information on patient assessment and treatment.   ------------------------------------------------------------------------------------------------------------------ FINAL IMPRESSION AND DISPOSITION  1. CVA (cerebral vascular accident) (CMS/HCC)    Plan/Orders  Assosiated with Diagnosis  and ICD Codes    Diagnosis Name ICD-10-CM Plan/Orders Assosiated with Diagnosis  1. CVA (cerebral vascular accident) (CMS/HCC)  I63.9      Shared Decision-Making:  Treatment plan and disposition discussed with the patient/family, questions answered   PATIENT REFERRED TO:  DISCHARGE MEDICATIONS: New Prescriptions   No medications on file    Emmitt DELENA Slice, MD 05/05/24 [id number]   Emmitt DELENA Slice, MD 05/05/24 0230  Electronically signed by Emmitt DELENA Slice, MD at 05/04/2024 11:30 PM PDT

---

## 1997-08-14 HISTORY — PX: CYSTO: SHX6284

## 1998-04-21 ENCOUNTER — Ambulatory Visit (HOSPITAL_COMMUNITY): Admission: RE | Admit: 1998-04-21 | Discharge: 1998-04-21 | Payer: Self-pay | Admitting: Cardiology

## 1998-08-07 ENCOUNTER — Encounter: Payer: Self-pay | Admitting: *Deleted

## 1998-08-07 ENCOUNTER — Inpatient Hospital Stay (HOSPITAL_COMMUNITY): Admission: EM | Admit: 1998-08-07 | Discharge: 1998-08-12 | Payer: Self-pay | Admitting: Emergency Medicine

## 1998-08-07 ENCOUNTER — Encounter: Payer: Self-pay | Admitting: Emergency Medicine

## 1998-08-11 ENCOUNTER — Encounter: Payer: Self-pay | Admitting: Cardiology

## 1999-09-05 ENCOUNTER — Ambulatory Visit (HOSPITAL_COMMUNITY): Admission: RE | Admit: 1999-09-05 | Discharge: 1999-09-05 | Payer: Self-pay | Admitting: Internal Medicine

## 1999-09-05 ENCOUNTER — Encounter: Payer: Self-pay | Admitting: Internal Medicine

## 1999-10-17 ENCOUNTER — Encounter: Payer: Self-pay | Admitting: Internal Medicine

## 1999-10-17 ENCOUNTER — Ambulatory Visit (HOSPITAL_COMMUNITY): Admission: RE | Admit: 1999-10-17 | Discharge: 1999-10-17 | Payer: Self-pay | Admitting: Internal Medicine

## 1999-10-18 ENCOUNTER — Encounter: Payer: Self-pay | Admitting: Internal Medicine

## 1999-10-18 ENCOUNTER — Encounter: Admission: RE | Admit: 1999-10-18 | Discharge: 1999-10-18 | Payer: Self-pay | Admitting: Internal Medicine

## 1999-10-18 ENCOUNTER — Inpatient Hospital Stay (HOSPITAL_COMMUNITY): Admission: RE | Admit: 1999-10-18 | Discharge: 1999-10-25 | Payer: Self-pay | Admitting: Urology

## 1999-10-19 ENCOUNTER — Encounter: Payer: Self-pay | Admitting: Urology

## 1999-10-20 ENCOUNTER — Encounter: Payer: Self-pay | Admitting: Internal Medicine

## 1999-10-24 ENCOUNTER — Encounter: Payer: Self-pay | Admitting: Urology

## 1999-10-27 ENCOUNTER — Encounter: Admission: RE | Admit: 1999-10-27 | Discharge: 1999-10-27 | Payer: Self-pay | Admitting: Urology

## 1999-10-27 ENCOUNTER — Encounter: Payer: Self-pay | Admitting: Urology

## 2000-04-20 ENCOUNTER — Encounter: Admission: RE | Admit: 2000-04-20 | Discharge: 2000-04-20 | Payer: Self-pay | Admitting: Urology

## 2000-04-20 ENCOUNTER — Encounter: Payer: Self-pay | Admitting: Urology

## 2000-09-03 ENCOUNTER — Encounter: Payer: Self-pay | Admitting: Neurosurgery

## 2000-09-03 ENCOUNTER — Ambulatory Visit (HOSPITAL_COMMUNITY): Admission: RE | Admit: 2000-09-03 | Discharge: 2000-09-04 | Payer: Self-pay | Admitting: Neurosurgery

## 2000-10-09 ENCOUNTER — Ambulatory Visit (HOSPITAL_COMMUNITY): Admission: RE | Admit: 2000-10-09 | Discharge: 2000-10-09 | Payer: Self-pay | Admitting: Neurosurgery

## 2000-10-09 ENCOUNTER — Encounter: Payer: Self-pay | Admitting: Neurosurgery

## 2000-10-20 ENCOUNTER — Emergency Department (HOSPITAL_COMMUNITY): Admission: EM | Admit: 2000-10-20 | Discharge: 2000-10-20 | Payer: Self-pay | Admitting: Emergency Medicine

## 2000-12-18 ENCOUNTER — Encounter: Payer: Self-pay | Admitting: Neurosurgery

## 2000-12-18 ENCOUNTER — Ambulatory Visit (HOSPITAL_COMMUNITY): Admission: RE | Admit: 2000-12-18 | Discharge: 2000-12-18 | Payer: Self-pay | Admitting: Neurosurgery

## 2001-01-11 ENCOUNTER — Ambulatory Visit (HOSPITAL_COMMUNITY): Admission: RE | Admit: 2001-01-11 | Discharge: 2001-01-11 | Payer: Self-pay | Admitting: Neurosurgery

## 2001-01-12 ENCOUNTER — Encounter: Payer: Self-pay | Admitting: Neurosurgery

## 2001-01-12 ENCOUNTER — Ambulatory Visit (HOSPITAL_COMMUNITY): Admission: RE | Admit: 2001-01-12 | Discharge: 2001-01-12 | Payer: Self-pay | Admitting: Neurosurgery

## 2001-01-23 ENCOUNTER — Encounter: Admission: RE | Admit: 2001-01-23 | Discharge: 2001-04-13 | Payer: Self-pay | Admitting: Anesthesiology

## 2001-01-23 ENCOUNTER — Other Ambulatory Visit: Admission: RE | Admit: 2001-01-23 | Discharge: 2001-01-23 | Payer: Self-pay | Admitting: Obstetrics

## 2001-05-12 ENCOUNTER — Ambulatory Visit (HOSPITAL_COMMUNITY): Admission: RE | Admit: 2001-05-12 | Discharge: 2001-05-12 | Payer: Self-pay | Admitting: Pulmonary Disease

## 2001-05-12 ENCOUNTER — Encounter: Payer: Self-pay | Admitting: Pulmonary Disease

## 2001-11-18 ENCOUNTER — Encounter (INDEPENDENT_AMBULATORY_CARE_PROVIDER_SITE_OTHER): Payer: Self-pay

## 2001-11-18 ENCOUNTER — Ambulatory Visit (HOSPITAL_COMMUNITY): Admission: RE | Admit: 2001-11-18 | Discharge: 2001-11-18 | Payer: Self-pay | Admitting: Gastroenterology

## 2001-11-21 ENCOUNTER — Encounter: Payer: Self-pay | Admitting: Emergency Medicine

## 2001-11-21 ENCOUNTER — Emergency Department (HOSPITAL_COMMUNITY): Admission: EM | Admit: 2001-11-21 | Discharge: 2001-11-21 | Payer: Self-pay | Admitting: Emergency Medicine

## 2002-07-28 ENCOUNTER — Encounter: Admission: RE | Admit: 2002-07-28 | Discharge: 2002-07-28 | Payer: Self-pay | Admitting: Urology

## 2002-07-28 ENCOUNTER — Encounter: Payer: Self-pay | Admitting: Urology

## 2002-07-29 ENCOUNTER — Encounter: Payer: Self-pay | Admitting: Urology

## 2002-07-29 ENCOUNTER — Encounter: Admission: RE | Admit: 2002-07-29 | Discharge: 2002-07-29 | Payer: Self-pay | Admitting: Urology

## 2002-08-23 ENCOUNTER — Ambulatory Visit (HOSPITAL_COMMUNITY): Admission: RE | Admit: 2002-08-23 | Discharge: 2002-08-23 | Payer: Self-pay | Admitting: Neurosurgery

## 2002-08-23 ENCOUNTER — Encounter: Payer: Self-pay | Admitting: Neurosurgery

## 2002-11-28 ENCOUNTER — Encounter: Payer: Self-pay | Admitting: Emergency Medicine

## 2002-11-28 ENCOUNTER — Emergency Department (HOSPITAL_COMMUNITY): Admission: EM | Admit: 2002-11-28 | Discharge: 2002-11-28 | Payer: Self-pay | Admitting: Emergency Medicine

## 2003-01-22 ENCOUNTER — Encounter: Payer: Self-pay | Admitting: Emergency Medicine

## 2003-01-22 ENCOUNTER — Emergency Department (HOSPITAL_COMMUNITY): Admission: EM | Admit: 2003-01-22 | Discharge: 2003-01-22 | Payer: Self-pay | Admitting: Emergency Medicine

## 2003-10-05 ENCOUNTER — Encounter: Admission: RE | Admit: 2003-10-05 | Discharge: 2003-10-05 | Payer: Self-pay | Admitting: Internal Medicine

## 2003-10-08 ENCOUNTER — Encounter: Admission: RE | Admit: 2003-10-08 | Discharge: 2003-10-08 | Payer: Self-pay | Admitting: Neurosurgery

## 2003-10-10 ENCOUNTER — Ambulatory Visit (HOSPITAL_COMMUNITY): Admission: RE | Admit: 2003-10-10 | Discharge: 2003-10-10 | Payer: Self-pay | Admitting: Neurosurgery

## 2003-11-17 ENCOUNTER — Encounter: Admission: RE | Admit: 2003-11-17 | Discharge: 2003-11-17 | Payer: Self-pay | Admitting: Internal Medicine

## 2004-09-19 ENCOUNTER — Encounter: Admission: RE | Admit: 2004-09-19 | Discharge: 2004-09-19 | Payer: Self-pay | Admitting: Neurosurgery

## 2004-10-03 ENCOUNTER — Encounter: Admission: RE | Admit: 2004-10-03 | Discharge: 2004-10-03 | Payer: Self-pay | Admitting: Neurosurgery

## 2004-10-17 ENCOUNTER — Encounter: Admission: RE | Admit: 2004-10-17 | Discharge: 2004-10-17 | Payer: Self-pay | Admitting: Neurosurgery

## 2004-12-15 ENCOUNTER — Encounter: Admission: RE | Admit: 2004-12-15 | Discharge: 2004-12-15 | Payer: Self-pay | Admitting: Neurosurgery

## 2005-03-23 ENCOUNTER — Ambulatory Visit (HOSPITAL_COMMUNITY): Admission: RE | Admit: 2005-03-23 | Discharge: 2005-03-23 | Payer: Self-pay | Admitting: Orthopedic Surgery

## 2005-03-23 ENCOUNTER — Ambulatory Visit (HOSPITAL_BASED_OUTPATIENT_CLINIC_OR_DEPARTMENT_OTHER): Admission: RE | Admit: 2005-03-23 | Discharge: 2005-03-23 | Payer: Self-pay | Admitting: Orthopedic Surgery

## 2005-04-24 ENCOUNTER — Emergency Department (HOSPITAL_COMMUNITY): Admission: EM | Admit: 2005-04-24 | Discharge: 2005-04-24 | Payer: Self-pay | Admitting: Emergency Medicine

## 2005-10-24 ENCOUNTER — Emergency Department (HOSPITAL_COMMUNITY): Admission: EM | Admit: 2005-10-24 | Discharge: 2005-10-24 | Payer: Self-pay | Admitting: Emergency Medicine

## 2006-06-15 ENCOUNTER — Ambulatory Visit (HOSPITAL_BASED_OUTPATIENT_CLINIC_OR_DEPARTMENT_OTHER): Admission: RE | Admit: 2006-06-15 | Discharge: 2006-06-15 | Payer: Self-pay | Admitting: Orthopedic Surgery

## 2006-08-14 HISTORY — PX: BACK SURGERY: SHX140

## 2006-11-23 ENCOUNTER — Emergency Department (HOSPITAL_COMMUNITY): Admission: EM | Admit: 2006-11-23 | Discharge: 2006-11-23 | Payer: Self-pay | Admitting: Family Medicine

## 2007-06-29 ENCOUNTER — Encounter: Admission: RE | Admit: 2007-06-29 | Discharge: 2007-06-29 | Payer: Self-pay | Admitting: Internal Medicine

## 2007-07-08 ENCOUNTER — Encounter: Admission: RE | Admit: 2007-07-08 | Discharge: 2007-07-08 | Payer: Self-pay | Admitting: Neurosurgery

## 2007-07-26 ENCOUNTER — Encounter (INDEPENDENT_AMBULATORY_CARE_PROVIDER_SITE_OTHER): Payer: Self-pay | Admitting: Neurosurgery

## 2007-07-26 ENCOUNTER — Ambulatory Visit (HOSPITAL_COMMUNITY): Admission: RE | Admit: 2007-07-26 | Discharge: 2007-07-27 | Payer: Self-pay | Admitting: Neurosurgery

## 2007-08-15 HISTORY — PX: CERVICAL DISCECTOMY: SHX98

## 2007-09-05 ENCOUNTER — Encounter: Admission: RE | Admit: 2007-09-05 | Discharge: 2007-09-05 | Payer: Self-pay | Admitting: Neurosurgery

## 2007-10-03 ENCOUNTER — Encounter: Admission: RE | Admit: 2007-10-03 | Discharge: 2007-10-03 | Payer: Self-pay | Admitting: Neurosurgery

## 2007-11-20 ENCOUNTER — Inpatient Hospital Stay (HOSPITAL_COMMUNITY): Admission: RE | Admit: 2007-11-20 | Discharge: 2007-11-25 | Payer: Self-pay | Admitting: Neurosurgery

## 2007-12-08 ENCOUNTER — Emergency Department (HOSPITAL_COMMUNITY): Admission: EM | Admit: 2007-12-08 | Discharge: 2007-12-08 | Payer: Self-pay | Admitting: Emergency Medicine

## 2008-02-13 ENCOUNTER — Encounter: Admission: RE | Admit: 2008-02-13 | Discharge: 2008-02-13 | Payer: Self-pay | Admitting: Neurosurgery

## 2008-03-19 ENCOUNTER — Encounter: Admission: RE | Admit: 2008-03-19 | Discharge: 2008-03-19 | Payer: Self-pay | Admitting: Neurosurgery

## 2008-05-19 ENCOUNTER — Encounter: Admission: RE | Admit: 2008-05-19 | Discharge: 2008-05-19 | Payer: Self-pay | Admitting: Neurosurgery

## 2008-07-07 ENCOUNTER — Encounter: Admission: RE | Admit: 2008-07-07 | Discharge: 2008-07-07 | Payer: Self-pay | Admitting: Neurosurgery

## 2009-04-21 ENCOUNTER — Ambulatory Visit (HOSPITAL_BASED_OUTPATIENT_CLINIC_OR_DEPARTMENT_OTHER): Admission: RE | Admit: 2009-04-21 | Discharge: 2009-04-21 | Payer: Self-pay | Admitting: Orthopedic Surgery

## 2010-06-29 ENCOUNTER — Encounter: Admission: RE | Admit: 2010-06-29 | Discharge: 2010-06-29 | Payer: Self-pay | Admitting: Otolaryngology

## 2010-07-13 ENCOUNTER — Other Ambulatory Visit
Admission: RE | Admit: 2010-07-13 | Discharge: 2010-07-13 | Payer: Self-pay | Source: Home / Self Care | Admitting: Interventional Radiology

## 2010-07-13 ENCOUNTER — Encounter: Admission: RE | Admit: 2010-07-13 | Discharge: 2010-07-13 | Payer: Self-pay | Admitting: Otolaryngology

## 2010-07-25 ENCOUNTER — Encounter (INDEPENDENT_AMBULATORY_CARE_PROVIDER_SITE_OTHER): Payer: Self-pay | Admitting: Otolaryngology

## 2010-07-25 ENCOUNTER — Ambulatory Visit (HOSPITAL_COMMUNITY)
Admission: RE | Admit: 2010-07-25 | Discharge: 2010-07-26 | Payer: Self-pay | Source: Home / Self Care | Attending: Otolaryngology | Admitting: Otolaryngology

## 2010-10-24 LAB — BASIC METABOLIC PANEL WITH GFR
BUN: 11 mg/dL (ref 6–23)
CO2: 26 meq/L (ref 19–32)
Chloride: 111 meq/L (ref 96–112)
GFR calc non Af Amer: >60 mL/min (ref >60)
Glucose, Bld: 101 mg/dL — ABNORMAL HIGH (ref 70–99)
Potassium: 4.7 meq/L (ref 3.5–5.1)

## 2010-10-24 LAB — ANAEROBIC CULTURE

## 2010-10-24 LAB — URINALYSIS, ROUTINE W REFLEX MICROSCOPIC
Bilirubin Urine: NEGATIVE
Glucose, UA: NEGATIVE mg/dL
Hgb urine dipstick: NEGATIVE
Ketones, ur: NEGATIVE mg/dL
Nitrite: NEGATIVE
Protein, ur: NEGATIVE mg/dL
Specific Gravity, Urine: 1.017 (ref 1.005–1.030)
Urobilinogen, UA: 0.2 mg/dL (ref 0.0–1.0)
pH: 6 (ref 5.0–8.0)

## 2010-10-24 LAB — CBC
HCT: 38.8 % (ref 36.0–46.0)
Hemoglobin: 12.7 g/dL (ref 12.0–15.0)
MCH: 26.5 pg (ref 26.0–34.0)
MCHC: 32.7 g/dL (ref 30.0–36.0)
MCV: 80.8 fL (ref 78.0–100.0)
Platelets: 285 10*3/uL (ref 150–400)
RBC: 4.8 MIL/uL (ref 3.87–5.11)
RDW: 14.8 % (ref 11.5–15.5)
WBC: 8.3 10*3/uL (ref 4.0–10.5)

## 2010-10-24 LAB — CULTURE, ROUTINE-ABSCESS: Culture: NO GROWTH

## 2010-10-24 LAB — SURGICAL PCR SCREEN
MRSA, PCR: NEGATIVE
Staphylococcus aureus: NEGATIVE

## 2010-10-24 LAB — BASIC METABOLIC PANEL
Calcium: 9.7 mg/dL (ref 8.4–10.5)
Creatinine, Ser: 0.66 mg/dL (ref 0.4–1.2)
GFR calc Af Amer: >60 mL/min (ref >60)
Sodium: 142 mEq/L (ref 135–145)

## 2010-12-27 NOTE — Op Note (Signed)
NAMELUISE, YAMAMOTO              ACCOUNT NO.:  [id number]   MEDICAL RECORD NO.:  [id number]          PATIENT TYPE:  AMB   LOCATION:  SDS                          FACILITY:  [facility]   PHYSICIAN:  Donalee Citrin, M.D.        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   PROCEDURE:  Exploration of fusion C4 to C6 with removal of hardware C4  to C6 and ACDF at C3-4 using an 8 mm Cornerstone allograft wedge and a  25 mm Atlantis Vision plate.   SURGEON:  Donalee Citrin, M.D.   ASSISTANT:  Kritzer.   ANESTHESIA.:  General endotracheal.   HISTORY OF PRESENT ILLNESS:  The patient is a pleasant 60 year old  female who has had longstanding chronic neck pain who had undergone a 4-  6 ACDF a couple of years ago and presents with progressive numbness and  weakness in her hands and worsening neck pain.  Imaging with MRI scan  showed a very large disk herniation above the level of her fusion  compressing her spinal cord.  The patient was recommended reexploration  and fusion and removal of hardware and ACDF at C3-4.  Risks, benefits of  the operation were explained to the patient who agreed to proceed  forward.   PROCEDURE IN DETAIL:  The patient was brought to the OR and received  general anesthesia, placed supine.  Neck was flexed in extension, 5  pounds of traction device.  Her neck was prepped and draped in routine  sterile fashion.  Preoperative x-ray localized the C3-4 interspace and  so a curvilinear incision was made just off the midline to the anterior  border of the sternocleidomastoid.  The scar tissue was dissected free  and the avascular plane between the sternocleidomastoid and the strap  muscles was developed down to the prevertebral fascia.  The prevertebral  fascia was then dissected away and  the scar tissue was dissected  exposing the old Atlantis plate.  Locking mechanisms were disengaged and  the screws removed sequentially.  There was marked bony overgrowth under  the inferior right side at C6 screw.  This had to be partially drilled  away and removed to enable the screw head to be visualized and then this  was removed and the plate was removed.  All screw holes were plugged  with wax then attention was taken to C3-4.  There was a large anterior  disk herniation and this was incised with the 15 blade scalpel and the  disk was removed sequentially.  Then the interspace was drilled down to  the posterior annular and posterior osteophytic complexes.  There was  noted to be a very large disk herniation that had migrated  subligamentous at that level.  It was teased away with a black nerve  hook and the undersurface of the 3 and 4 endplates were aggressively  under bitten, decompressing the central canal after all disk  material  had been removed.  Both C4 pedicles were palpated.  Both proximal C4  nerve roots were identified, mark the lateral margins of the diskectomy.  Then a 7 mm graft was inserted and had good opposition of the endplates,  was 1-2 mm deep to the anterior vertebral body  line then a 25 mm  Atlantis Vision plate was then placed.  Two rescue screws were placed in  the old screw holes and 4 and 2 new screw holes were drilled and all  screws had excellent purchase.  Locking mechanism was then engaged.  Meticulous hemostasis was obtained.  Postop fluoroscopy showing good  position of  plate, screws and bone graft.  The wound was then closed in layers with  interrupted Vicryl in the platysma and a running 4-0 subcuticular.  Benzoin and Steri-Strips were placed.  The patient went to the recovery  room in stable condition.  At the end of the case the needle count was  correct.           ______________________________  Donalee Citrin, M.D.     GC/MEDQ  D:   07/26/2007  T:  07/28/2007  Job:  [id number]

## 2010-12-27 NOTE — Op Note (Signed)
Julie Obrien, Julie Obrien NO.:  [id number]   MEDICAL RECORD NO.:  [id number]          PATIENT TYPE:  INP   LOCATION:  3010                         FACILITY:  [facility]   PHYSICIAN:  Donalee Citrin, M.D.        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   PROCEDURE:  A Gill decompression of L4-L5 with decompressive laminectomy  and excessive __________ interbody fusion of L5-S1.  Posterior lumbar  fusion at L4-L5 and L5-S1 using a hybrid Telamon PEEK cage on one side  with a tangent allograft wedge on the other pedicle screw fixation, L4  to S1, using a 6.35 legacy pedicle screw system posterolateral  arthrodesis L4 to S1 using locally __________ and packed with Progenix  bone substitute.   SURGEON:  Donalee Citrin, M.D.   ASSISTANT:  Dr. Yetta Barre.   ANESTHESIA:  General endotracheal.   HISTORY OF PRESENT ILLNESS:  The patient is a very pleasant 60 year old  female, with a longstanding progressive worsening back, predominantly  right leg pain radiating down through hip and the back of the leg  piriformis showed an occasionally top of foot big toe consistent with  both an L4 and L5 nerve root pattern.  MRI scan showed a generous  spondylolisthesis at L4-L5 and severe spinal stenosis at L5-S1 with  biforaminal stenosis at both the L4 and L5 nerve roots.  The patient  failed all forms of treatment and was, therefore, recommended radical  decompression and stabilization.  Procedural risks and benefits of the  operation were explained to the patient.  She understood and agreed to  proceed forth.   PROCEDURE IN DETAIL:  The patient was brought to the OR, was induced  general anesthesia, positioned __________  at the appropriate level.  After obtaining anesthesia with lidocaine  with epinephrine, midline  incision was made and Bovie electrocautery was used __________  subperiosteal dissection carried out in lamina of L3, L4, L5, and S1  bilaterally.  T-piece at L4-L5, and S1 were exposed.  Intraoperative  __________  at the L4 T-piece and the spinous processes at L4 and L5  were removed and central decompression was begun.  The patient had  remarked facet arthropathy causing severe __________  of the first  thecal sac.  This was teased off of the undersurface above the  circumflex of the 4 Penfield and removed in piecemeal fashion with  Kerrison punch.  Complete medial facetectomies were performed both at L4  and L5 to gain access to the lateral margin of the disk spaces.  The L4  nerve root on the right was remarkedly stenotic and collapsed, flushed  with the pedicle bilaterally with severe amount of compression on the  disk space pressing up from underneath.  This was teased away and  unroofed.  The foramen of the nerve root was unroofed and lateral margin  of the undersurface of the circumflex was bitten  away to gain access to  the lateral disk space.  This procedure was repeated at L5 with a  similar fashion with the L5 nerve root also be displaced dorsally and  superiorly by the disk space.  Then, on the left, radical decompression  were also carried out in the L4-L5 and S1 nerve roots.  After complete  decompression and laminectomy has been performed and progressive  foraminotomies at L4-L5 and S1 roots.  Pedicle screw placement initiated  first on the right.  At L4, pedicle was drilled.  It was cannulated with  the all probed tap of the __________ tap, probed again and 6 x 45 screw  inserted at the right L4.  The pedicle was inspected both within the  canal as well as external to the canal.  Bony landmarks were probed and  palpated from within the pedicle as well as also the pedicle.  Fluoroscopy at each step along the way in a similar fashion on the L5   and S1 and the screws inserted on the right were 6 x 45 at L5 and 6 x 40  at S1.  On the left side, screws inserted in similar fashion were 6 x 45  at L4 and L5 and 6 x 40 at S1.  After all 6 screws were in place,  attention __________  interbody worked first __________  was inserted on  the left side at L4-L5 with an annulotomy to clean the disk space on the  left.  Then, working on the right, radical discectomy was performed with  aggressive discectomy in the lateral under the facet complex and the  extraforaminal space decompressed undersurface of the forward.  Then, a  size A10 cutter and chisel were used to repair the endplates and a 10 x  26 mm __________ allograft wedge was inserted on the right side of L4-L5  distracting disk space as well as the diskectomy and the foraminotomy  opened up the L4 neural foramen.  In a similar fashion, the distractor  was inserted on the left and was removed on the left in a similar  fashion on the left side interspace with fairly local autograft that was  packed against the right side allograft and the left side PEEK Telamon  cage packed with local graft mixed with Progenix was inserted on the  left side after local autograft was mixed and packed centrally.  Then,  at L5-S1, in similar fashion, the endplates were scraped.  An 8 x 26-mm  tangent, 8 x 22-mm PEEK cage was inserted after all of the above  placements.  Fluoroscopy confirmed good position of bone grafts and all  foramina were reinspected to confirm patency.  Wound was then copiously  irrigated.  Hemostasis was maintained.  Aggressive decortication was  carried out at the T-piece in the lateral gutters at both sides.  Then  the 60-mm rod was inserted.  __________  dissected out of S1, L5  __________  S1 and the L4 compressed against L5.  __________  graft was  inserted, foramina reinspected.  Local autograft was packed aggressively  prior to rod placement in the lateral gutters and then the  postop  fluoroscopy confirmed good position of screws and rods.   Then, the medium Hemovac drain was placed and wound was closed in layers  with Vicryl and skin was closed with running 4-0 subcuticular __________  .  The patient went to recovery room in stable condition.  ______________________________  Donalee Citrin, M.D.     GC/MEDQ  D:  11/20/2007  T:  11/21/2007  Job:  [id number]

## 2010-12-30 NOTE — Op Note (Signed)
[place]  Patient:    Julie Obrien, Julie Obrien                     MRN: [id number] Proc. Date: 09/03/00 Adm. 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  PROCEDURE:  Endoscopic diskectomy with fusions at C4-5 and C5-6 with fibula allograft, 6 mm in C4-5, 7 mm in C5-6, with a 42.5 mm plate and 13 mm screws.  SURGEON:  Donzetta Sprung. Roney Jaffe., M.D.  FIRST ASSISTANT:  Bradd Canary., M.D.  ANESTHESIA:  General endotracheal.  IV FLUIDS:  1400.  ESTIMATED BLOOD LOSS:  Less than 100.  CLINICAL HISTORY:  Patient is a very pleasant 60 year old female, who has had longstanding neck pain that has gotten progressively worse over the last several months.  She also has intermittent numbness in her hands with severe extension of her neck.  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  The risks and benefits of surgery were extensively talked to the patient, and she desired to proceed forward.  DESCRIPTION OF PROCEDURE:  The patient was brought in the OR, was induced under general anesthesia.  Her head was prepped and draped in the routine sterile fashion.  The right side of her neck was draped out.  The preoperative x-ray confirmed the C5 vertebral body, and a curvilinear incision was made from the midline to the anterior border of the sternocleidomastoid.  The superficial layer of the platysma was dissected out and then divided transversely.  Then the avascular plane between the sternocleidomastoid and the omohyoid and strap muscles was developed down to prevertebral fascia. Prevertebral fascia was dissected out with Kitners.   Intraoperative x-ray confirmed the C5-6 interspace, and Bovie electrocautery was used to reflect the longus colli laterally.  A self-retaining retractor was placed.  Attention was first taken to C4-5 interspace.  Annulotomy was made with an 11 blade scalpel and pituitary rongeurs were used to remove the anterior margin of the annulus.  Then, using a combination of BA and FA curettes and 2 and 3 mm Kerrison punches, the remainder of the disk was removed down to the posterior longitudinal ligament, and this was removed.  The operating microscope was draped and brought into the field.  Under microscopic illumination, the remainder of the posterior longitudinal ligament was removed in a piecemeal fashion.  There was a large central disk bulge that was teased out as well as from underneath the C4 body, and the thecal sac was noted to be displaced prior to this, and post removal of this central disk fragment, it was noted to be bulging out ventrally and decompressed.  The remainder of the ligament was removed.  The proximal elements of the foramen were explored.  No further compression was noted.  Gelfoam was placed, and attention was taken to the C5-6 interspace.  Annulotomy was made with an 11 blade scalpel and pituitary rongeurs used to remove the anterior margin of the annulus.  The Anspach drill with the Blue 8 drill bit was used to drill  down to the anterior margin of the annulus down to posterior osteophyte, then the 1 and 2 mm Kerrison punches were used to remove the posterior osteophyte.  The ligament was then visualized and removed in a piecemeal fashion with the 1 and 2 mm Kerrison punch.  Again a large central disk fragment was teased away and removed and after this, the thecal sac was noted to be decompressed.  The remainder of the ligament was removed in a piecemeal fashion.  The proximal aspect of the left C6 nerve root was explored and decompressed as well as the right C6  nerve root.  After the remainder of the diskectomy was completed, there was no further compression on both C6 nerve roots or the thecal sac.  The wound was copiously irrigated, the end plates prepared to receive arthrodesis at both C4-5 and C5-6.  A 6 mm fibula allograft was inserted at C4-5 after interbodies were distracted, and the same thing was repeated at C5-6.  The operating microscope was removed.  Then the 42.5 mm Atlantis plate was selected and sized.  Then using a fixed drill guide, holes were drilled at C4, C5, and C6, and tapped, and 13 mm screws were inserted at all these levels.  Postoperative x-ray confirmed good localization of the flap and the bone grafts.  The wound was copiously irrigated and meticulous hemostasis was maintained.  The self-retaining retractors were removed, and then the incision was closed with 3-0 interrupted Vicryl, and the skin was closed with interrupted 4-0 subcuticular.  Tincture of benzoin and Steri-Strips were applied.  The patient went to the recovery room in stable condition.  At the end of the case, all needle counts and sponge counts were correct. DD:  09/03/00 TD:  09/03/00 Job: [id number]

## 2010-12-30 NOTE — Op Note (Signed)
NAMERENLEIGH, OUELLET              ACCOUNT NO.:  [id number]   MEDICAL RECORD NO.:  [id number]          PATIENT TYPE:  AMB   LOCATION:  DSC                          FACILITY:  [facility]   PHYSICIAN:  Harvie Junior, M.D.   DATE OF BIRTH:  [birth year]   DATE OF PROCEDURE:  DATE OF DISCHARGE:                                 OPERATIVE REPORT   PREOPERATIVE DIAGNOSIS:  Questionable lateral meniscal tear with  chondromalacia of the patella.   POSTOPERATIVE DIAGNOSIS:  1. Tight lateral retinaculum.  2. Chondromalacia of the patellofemoral joint.  3. Chondromalacia with chondral flap, medial femoral condyle.   PRINCIPAL PROCEDURE:  1. Chondroplasty, medial femoral condyle with debridement of cartilage      flap.  2. Chondroplasty of the patellofemoral joint with debridement of both the      patella and the trochlear side.  3. Modified lateral retinacular release with a shaver.   SURGEON:  Harvie Junior, M.D.   ASSISTANT:  None.   ANESTHESIA:  General.   BRIEF HISTORY:  Ms. Wrigley is a 60 year old female with a long history of  having had significant knee pain.  She has been treated conservatively for a  long period of time with antiinflammatory medication, activity modification,  injection therapy.  All have failed.  An MRI was obtained which showed some  chondromalacia of the patella, some signal changes in the menisci.  We felt  that she had failed conservative care and because of continuing complaints  of pain she was taken to the operating room for operative knee arthroscopy.   PROCEDURE:  The patient was taken to the operating room.  After adequate  anesthesia was achieved, the patient was placed on the operating table.  The  right leg was then prepped and draped in the usual sterile fashion.  Following this, routine arthroscopic examination of the knee revealed there  was obvious chondromalacia of the patella with grade 3 change in the patella  and grade 2 change in the  trochlea.  Patellar tracking was reasonably  midline, but there was some tightness in the lateral retinaculum.   Attention was turned down to the medial compartment.  There was a medial  shelf plica, which was debrided.  In the medial femoral condyle, she had  some grade 2 and 3 change.  This was debrided with a suction shaver as there  were some flaps of cartilage.  The medial meniscus was probed and felt to be  stable.  ACL was normal.   Attention was turned laterally where there was some concern of her lateral  meniscus.  We probed the lateral meniscus at length and did not find any  lateral meniscal tear.  The lateral femoral condyle looked good.  The  lateral tibial plateau had some grade 2 change, which was debrided.   Attention was then turned back up into the patellofemoral joint where  further debridement was taken to the undersurface of the patella.  Given the  degree of chondromalacia of the patella and the tightness of the lateral  retinaculum, we felt that a lateral release was appropriate  and we did a  shaving lateral release of the lateral retinaculum, essentially spending a  fair amount of time taking down the lateral retinaculum to allow pressure  off the kneecap joint.  We did relieve pressure from the kneecap joint and  the patellar tracking did appear to be more midline.   At that point the wound was copiously irrigated and suctioned dry.  Arthroscopic portals were closed with a bandage.  A sterile compressive  dressing was applied.  The patient was taken to the recovery room and was  noted to be in satisfactory condition.   ESTIMATED BLOOD LOSS:  Estimated blood loss for the procedure was none.      Harvie Junior, M.D.  Electronically Signed     JLG/MEDQ  D:  06/15/2006  T:  06/16/2006  Job:  [id number]

## 2010-12-30 NOTE — Op Note (Signed)
[name], [name]              ACCOUNT NO.:  [id number]   MEDICAL RECORD NO.:  [id number]          PATIENT TYPE:  AMB   LOCATION:  DSC                          FACILITY:  [facility]   PHYSICIAN:  Katy Fitch. Sypher, M.D. DATE OF BIRTH:  01/14/1951   DATE OF PROCEDURE:  03/23/2005  DATE OF DISCHARGE:                                 OPERATIVE REPORT   PREOP DIAGNOSES:  1.  Right thumb stenosing tenosynovitis, chronic.  2.  Left long finger A1/A2 pulley ganglion formation with early stenosing      tenosynovitis.   POSTOPERATIVE DIAGNOSES:  1.  Right thumb stenosing tenosynovitis, chronic.  2.  Left long finger A1/A2 pulley ganglion formation with early stenosing      tenosynovitis.   OPERATION:  1.  Release of right thumb A1 pulley under local anesthesia sedation.  2.  Excision of left long finger A1 A2 ganglion with incidental release of      A1 pulley.   OPERATING SURGEON:  Katy Fitch. Sypher, M.D.   ASSISTANT:  Marveen Reeks Dasnoit, P.A.-C.   ANESTHESIA:  General sedation and local anesthesia, right thumb and left  long finger, supervising anesthesiologist is Dr. Autumn Patty.   INDICATIONS:  Julie Obrien is a 60 year old right-hand dominant woman  employed by [employer].   She is a long-term patient of orthopedic and hand specialists.   She has had chronic right thumb stenosing tenosynovitis unresponsive to  injection, activity modification, and anti-inflammatory medications.   During a recent evaluation at the office she noted chronic locking of her  right thumb and requested release of the A1 pulley.   She also noted a mass overlying the left long finger flexor sheath and  triggering of the left long finger.   We advised her that we could proceed with release of the right thumb A1  pulley and initially determine that we would inject her left long finger  flexor sheath in an effort to improve the ganglion and the stenosing  tenosynovitis symptoms involving the  left long finger.   In the holding area of the operating room, she requested that we removed the  ganglion.   Given the fact that she was going to be sedated, in my judgment it made  sense to proceed with bilateral repairs as to maximize her benefit with  resources expended at this time.   She amended her consent and at this time we intend to proceed with release  of the right thumb A1 pulley and excision of her left long finger ganglion.   If she is noted to have significant stenosing tenosynovitis symptoms in the  long finger. We will also perform an incidental release of the A1 pulley of  the left long finger.   PROCEDURE:  Julie Obrien was is brought to the operating room and placed  in supine position on the operating table.   Following sedation the right and left arms were prepped with Betadine soap  and solution and sterilely draped. Pneumatic tourniquets were applied to the  proximal right forearm and left brachium. The procedures commenced on the  right side  with exsanguination of the right hand and forearm with an Esmarch  bandage and inflation of the forearm tourniquet to 230 mmHg. Procedure  commenced with infiltration of 2% lidocaine and 0.25% Marcaine into the path  the intended incision. When anesthesia was satisfactory, a short incision  was fashioned rectally over the A1 pulley. The subcutaneous tissues were  carefully divided taking care to identify the radial proper digital nerve.  This was gently retracted. The pulley was split with scalpel and scissors  followed by delivery of the tendon.   Full range of motion of the IP joint was recovered.   The wound was then repaired with horizontal mattress sutures of 5-0 nylon x  2. The thumb was dressed with Xeroflo, sterile gauze and Ace wrap. The  tourniquet was released with immediate capillary refill to the thumb and  fingers.   Attention was then directed to the left hand.   The hand and forearm were  exsanguinated with an Esmarch bandage and arterial  tourniquet inflated to 230 mmHg on the proximal brachium. The procedure  commenced with a short oblique incision directly over the palpable mass. The  subcutaneous tissues were carefully divided revealing the flexor sheath.   There was a cyst measuring 3 x 4 mm noted at the distal margin of the A1  pulley at the fibers blending into the A2 pulley.   The cyst was meticulously resected with a rongeur.   Passive range of motion finger revealed triggering at the proximal A1  pulley. Therefore, I released the radial aspect of the A1 pulley with  scissors to prevent chronic stenosing tenosynovitis.   The wound was then repaired with mattress suture of 5-0 nylon.   A compressive dressing was applied with Xeroflo, sterile gauze and Ace wrap.   There were no apparent complications.   [name] tolerated both procedures well.  She was transferred to recovery  room with stable vital signs.   She was discharged to care of her family with prescription for Vicodin 5  milligrams one p.o. q. 4-6 hours p.r.n. pain 20 tablets without refill.       RVS/MEDQ  D:  03/23/2005  T:  03/23/2005  Job:  [id number]

## 2010-12-30 NOTE — Discharge Summary (Signed)
Julie Obrien, Julie Obrien              ACCOUNT NO.:  [id number]   MEDICAL RECORD NO.:  [id number]          PATIENT TYPE:  INP   LOCATION:  3010                         FACILITY:  [facility]   PHYSICIAN:  Donalee Citrin, M.D.        DATE OF BIRTH:  March 01, 1951   DATE OF ADMISSION:  11/20/2007  DATE OF DISCHARGE:  11/25/2007                               DISCHARGE SUMMARY   ADMITTING DIAGNOSIS:  Degenerative disk disease, L4-5, L5-S1.   DISCHARGE DIAGNOSIS:  Degenerative disk disease, L4-5, L5-S1.   PROCEDURE:  1. Decompressive lumbar laminectomy.  2. Posterior lumbar interbody fusion at L4-5 and L5-S1.   HOSPITAL COURSE:  The patient is a very pleasant 60 year old female with  longstanding back and leg pain who was admitted to the hospital an EMA,  and underwent the aforementioned procedure.  Postoperatively, the  patient did well and recovering on the floor.  On the floor, the patient  remained afebrile and was progressively mobilized over the first 24-48  hours with physical therapy.  The patient did very well.  Significant  improvement of her preoperative leg pain.  Back was stiff.  However, she  was able to be weaned off her PCA.  Her Foley was able to be taken out.  Her Hemovac was taken out.  As she continued to progress and ambulate,  the pain came under better control.  She was stable enough to be  discharged home, on hospital day #5, with scheduled followup in 2 weeks.   She was discharged on her oral pain medications and muscle relaxants.           ______________________________  Donalee Citrin, M.D.     GC/MEDQ  D:  01/01/2008  T:  01/02/2008  Job:  [id number]

## 2010-12-30 NOTE — Procedures (Signed)
[facility]  Patient:    Julie Obrien, Julie Obrien                     MRN: [id number] Proc. Date: 01/24/01 Adm. Date:  [id number] Attending:  Thyra Breed CC:         Donalee Citrin, Montez Hageman.   Procedure Report  PROCEDURE:  Lumbar epidural steroid injection.  DIAGNOSIS:  Lumbar radiculopathy into the right lower extremity with underlying lumbar spondylosis and spondylolisthesis with predominance of bulging at L4-5 to the right.  HISTORY OF PRESENT ILLNESS:  Julie Obrien is a very pleasant 60 year old who is sent to [place] by Dr. Donalee Citrin for a series of lumbar epidural steroid injections. She states she was in her usual state of health up until Dec 23, 2000 when she planted a flower garden for her mother. Three days later, she developed right calf discomfort. Three days subsequent to this it spread to her right hip. She was sent by her primary care physician to Dr. Chaney Malling who x-rayed her knee and did not find a great deal of arthritis but placed her on Celebrex. She did not improve and was seen back and got an injection to her hip. She continued to do poorly and started taking some hydrocodone she had prescribed for a recent surgery. She went back to Dr. Wynetta Emery, who had performed an anterior cervical diskectomy with fusion in January. He examined her and sent her for an MRI which demonstrated lumbar spondylosis with significant degenerative bulge at 4-5 impeding the right L4 nerve root with some grade 1 anterior listhesis. Based on these findings, she is sent for an epidural steroid injection.  She describes her pain as a cramping discomfort in the right hip region radiating out into the right calf. It is made worse by sitting or standing, improved by elevating her leg. There is no weakness, there is no bowel or bladder incontinence and there is minimal numbness and tingling. She describes minimal back discomfort. She notes that her leg discomfort is made worse by bowel  movements.  CURRENT MEDICATIONS:  Hydrocodone and Soma.  ALLERGIES:  SULFONAMIDES which results in hives.  FAMILY HISTORY:  Strongly positive for multiple types of cancer.  ACTIVE MEDICAL PROBLEMS:  Significant only for a past history of kidney stones.  PAST SURGICAL HISTORY:  Significant for cesarean section, diskectomy with fusion in the neck and a shunt for the kidney stone complicated by urosepsis.  SOCIAL HISTORY:  The patient is a half pack per day smoker. She does not drink alcohol. 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  REVIEW OF SYSTEMS:  GENERAL:  Significant for hot flashes. HEAD: Significant for tension headaches associated with the onset of her recent pain. EYES: Significant for corrective lenses.  NOSE/MOUTH/THROAT:  Negative. EARS: Negative. PULMONARY: Negative. CARDIOVASCULAR:  Negative. GI: Significant for constipation. GU: History of renal calculi. MUSCULOSKELETAL:  See HPI. NEUROLOGIC: See HPI, no history of seizure or stroke. HEMATOLOGIC:  Negative. CUTANEOUS:  Negative. ENDOCRINE:  Negative. PSYCHIATRIC:  Negative. ALLERGY/IMMUNOLOGIC:  Negative.  PHYSICAL EXAMINATION:  VITAL SIGNS:  Blood pressure is 150/73, heart rate 91, respiratory rate 18, O2 saturations 97%, pain level is 7/10.  GENERAL:  This is a pleasant female in no acute distress.  HEENT:  Head was normocephalic, atraumatic. Eyes, extraocular movements intact with conjunctivae and sclerae clear. Nose patent nares without discharge. Oropharynx was free of lesions.  NECK:  Supple without lymphadenopathy.  LUNGS:  Clear to auscultation and percussion.  HEART:  Regular rate and rhythm.  BREASTS/ABDOMINAL/PELVIC/RECTAL:  Not performed.  BACK:  Revealed intact forward flexion and hyperextension with negative straight leg raise signs. She is tender to palpation over the spinous processes in the lumbar region.  EXTREMITIES:  No cyanosis, clubbing  or edema with radial pulses and dorsalis pedis pulse 2+ and symmetric.  NEUROLOGIC:  The patient was oriented to person, place, time and reason for visit . Cranial nerves II-XII are grossly intact. Deep tendon reflexes were hypoactive and symmetric in the upper and lower extremities with downgoing toes. Motor was 5/5 with symmetric bulk and tone. Sensation was intact to vibratory sense and pinprick.  On exam, the patient was noted to have carpal tunnel surgery scars and stated she has had bilateral carpal tunnel surgery in the past.  IMPRESSION: 1. Lumbar radiculopathy with underlying degenerative bulge at 4-5 to the    right affecting the L4 nerve root with underlying anterior listhesis to    a mild extent and lumbar spondylosis. 2. History of renal calculi.  DISPOSITION:  I discussed the potential risks, benefits and limitations of a lumbar epidural steroid injection in detail with the patient as well as reviewed the side effects of corticosteroids. She is interested in pursuing this.  DESCRIPTION OF PROCEDURE:  After informed consent was obtained, the patient was placed in the sitting position and monitored. The patients back was prepped with Betadine x 3. A skin wheal was raised at the L4-5 interspace with 1 percent lidocaine. A 20 gauge Tuohy needle was introduced to the lumbar epidural space to loss of resistance to preservative free normal saline. There was no cerebrospinal fluid nor blood. The depth was 6.5 cm. I injected 80 mg of Medrol and 8 ml of preservative free normal saline. The patient tolerated this well.  CONDITION POST PROCEDURE:  Stable.  DISCHARGE INSTRUCTIONS:  Resume previous diet. Limitations in activities per instruction sheet. Continue on current medications. Follow-up with me in 1-2 weeks for repeat injections. DD:  01/24/01 TD:  01/24/01 Job: [id number]

## 2010-12-30 NOTE — Procedures (Signed)
[facility]  Patient:    Julie Obrien, Julie Obrien                     MRN: [id number] Proc. Date: 02/04/01 Adm. Date:  [id number] Attending:  Thyra Breed CC:         Donalee Citrin, Montez Hageman.   Procedure Report  PROCEDURE:  Lumbar epidural steroid injection.  DIAGNOSIS:  Lumbar radiculopathy with underlying lumbar spondylosis and spondylolisthesis.  INTERVAL HISTORY:  The patients noted a marked improvement after her first injection. She had 75% reduction in pain.  PHYSICAL EXAMINATION:  Blood pressure 130/61, heart rate 83, respiratory rate 18, O2 saturations 98% and pain level is 5/10. She shows good healing from a previous injection site.  DESCRIPTION OF PROCEDURE:  After informed consent was obtained, the patient was placed in the sitting position and monitored. The patients back was prepped with Betadine x 3. A skin wheal was raised at the L4-5 interspace with 1 percent lidocaine. A 20 gauge Tuohy needle was introduced to the lumbar epidural space to loss of resistance to preservative free normal saline. There was no cerebrospinal fluid nor blood. 80 mg of Medrol and 5 ml of preservative free normal saline was gently injected. The needle was flushed with 2 cc of preservative free normal saline and removed intact.  CONDITION POST PROCEDURE:  Stable.  DISCHARGE INSTRUCTIONS:  Resume previous diet. Limitations in activities per instruction sheet. Continue on current medications. Follow-up with me in one to two weeks for a repeat injection. DD:  02/04/01 TD:  02/04/01 Job: [id number]

## 2010-12-30 NOTE — Procedures (Signed)
[facility]  Patient:    Julie Obrien, Julie Obrien Visit Number: [id number] MRN: [id number]          Service Type: END Location: ENDO Attending Physician:  Louie Bun Dictated by:   Everardo All Madilyn Fireman, M.D. Proc. Date: 11/18/01 Admit Date:  11/18/2001   CC:         Eino Farber., M.D.   Procedure Report  PROCEDURE:  Colonoscopy with polypectomy.  INDICATION FOR PROCEDURE:  Family history of colon cancer in a first degree relative.  DESCRIPTION OF PROCEDURE:  The patient was placed in the left lateral decubitus position and placed on the pulse monitor with continuous low flow oxygen delivered via nasal cannula. He was sedated with 80 mg IV Demerol and 7.5 mg IV Versed. The Olympus video colonoscope was inserted into the rectum and advanced to the cecum, confirmed by transillumination of McBurneys point and visualization of the ileocecal valve and appendiceal orifice. The prep was excellent. The cecum, ascending, transverse and descending colon all appeared normal with no masses, polyps, diverticula or other mucosal abnormalities. Within the sigmoid colon, there was an 8-mm sessile polyp which was fulgurated by hot biopsy. The rectum appeared normal and retroflex view of the anus revealed no obvious internal hemorrhoids. The colonoscope was then withdrawn and the patient returned to the recovery room in stable condition. She tolerated the procedure well and there were no immediate complications.  IMPRESSION:  An 8-mm sigmoid polyp.  PLAN:  Await histology and will repeat colonoscopy in three to five years. Dictated by:   Everardo All Madilyn Fireman, M.D. Attending Physician:  Louie Bun DD:  11/18/01 TD:  11/18/01 Job: [id number]

## 2011-05-09 LAB — URINE MICROSCOPIC-ADD ON

## 2011-05-09 LAB — BASIC METABOLIC PANEL
Calcium: 9.9
GFR calc non Af Amer: >60
Glucose, Bld: 99
Sodium: 141

## 2011-05-09 LAB — CBC
HCT: 33.7 — ABNORMAL LOW
HCT: 38.1
Hemoglobin: 11.3 — ABNORMAL LOW
MCHC: 33.5
MCHC: 33.6
MCV: 79.7
MCV: 80.1
RBC: 4.21
RBC: 4.78
RDW: 14.4

## 2011-05-09 LAB — URINE CULTURE: Culture: NO GROWTH

## 2011-05-09 LAB — URINALYSIS, ROUTINE W REFLEX MICROSCOPIC
Glucose, UA: NEGATIVE
Ketones, ur: NEGATIVE
Nitrite: NEGATIVE
Specific Gravity, Urine: 1.013
pH: 7

## 2011-05-09 LAB — TYPE AND SCREEN: ABO/RH(D): O POS

## 2011-05-22 LAB — COMPREHENSIVE METABOLIC PANEL
ALT: 14
AST: 18
CO2: 26
Calcium: 9.9
Creatinine, Ser: 0.74
GFR calc Af Amer: >60
GFR calc non Af Amer: >60
Sodium: 140
Total Protein: 6.5

## 2011-05-22 LAB — CBC
MCHC: 33.3
MCV: 79.9
RDW: 14.3

## 2011-11-01 DIAGNOSIS — M25569 Pain in unspecified knee: Secondary | ICD-10-CM | POA: Diagnosis not present

## 2012-01-10 DIAGNOSIS — K5730 Diverticulosis of large intestine without perforation or abscess without bleeding: Secondary | ICD-10-CM | POA: Diagnosis not present

## 2012-01-10 DIAGNOSIS — Z800 Family history of malignant neoplasm of digestive organs: Secondary | ICD-10-CM | POA: Diagnosis not present

## 2012-01-10 DIAGNOSIS — Z1211 Encounter for screening for malignant neoplasm of colon: Secondary | ICD-10-CM | POA: Diagnosis not present

## 2012-05-10 DIAGNOSIS — Y9314 Activity, water aerobics and water exercise: Secondary | ICD-10-CM | POA: Diagnosis not present

## 2012-05-10 DIAGNOSIS — IMO0002 Reserved for concepts with insufficient information to code with codable children: Secondary | ICD-10-CM | POA: Diagnosis not present

## 2012-05-22 DIAGNOSIS — Z79899 Other long term (current) drug therapy: Secondary | ICD-10-CM | POA: Diagnosis not present

## 2012-05-22 DIAGNOSIS — N951 Menopausal and female climacteric states: Secondary | ICD-10-CM | POA: Diagnosis not present

## 2012-05-22 DIAGNOSIS — Z0000 Encounter for general adult medical examination without abnormal findings: Secondary | ICD-10-CM | POA: Diagnosis not present

## 2012-05-22 DIAGNOSIS — E559 Vitamin D deficiency, unspecified: Secondary | ICD-10-CM | POA: Diagnosis not present

## 2012-05-22 DIAGNOSIS — E7800 Pure hypercholesterolemia, unspecified: Secondary | ICD-10-CM | POA: Diagnosis not present

## 2012-06-14 ENCOUNTER — Ambulatory Visit
Admission: RE | Admit: 2012-06-14 | Discharge: 2012-06-14 | Disposition: A | Discharge status: Home / Self Care | Payer: Medicare Other | Source: Ambulatory Visit | Attending: Internal Medicine | Admitting: Internal Medicine

## 2012-06-14 ENCOUNTER — Other Ambulatory Visit: Payer: Self-pay | Admitting: Internal Medicine

## 2012-06-14 DIAGNOSIS — R609 Edema, unspecified: Secondary | ICD-10-CM

## 2012-06-14 DIAGNOSIS — M7989 Other specified soft tissue disorders: Secondary | ICD-10-CM | POA: Diagnosis not present

## 2012-06-14 DIAGNOSIS — R233 Spontaneous ecchymoses: Secondary | ICD-10-CM | POA: Diagnosis not present

## 2012-06-14 DIAGNOSIS — M79609 Pain in unspecified limb: Secondary | ICD-10-CM | POA: Diagnosis not present

## 2012-07-03 DIAGNOSIS — Z13820 Encounter for screening for osteoporosis: Secondary | ICD-10-CM | POA: Diagnosis not present

## 2012-07-03 DIAGNOSIS — Z1231 Encounter for screening mammogram for malignant neoplasm of breast: Secondary | ICD-10-CM | POA: Diagnosis not present

## 2012-08-23 DIAGNOSIS — Z23 Encounter for immunization: Secondary | ICD-10-CM | POA: Diagnosis not present

## 2013-01-09 DIAGNOSIS — E559 Vitamin D deficiency, unspecified: Secondary | ICD-10-CM | POA: Diagnosis not present

## 2013-01-09 DIAGNOSIS — R7309 Other abnormal glucose: Secondary | ICD-10-CM | POA: Diagnosis not present

## 2013-01-09 DIAGNOSIS — E669 Obesity, unspecified: Secondary | ICD-10-CM | POA: Diagnosis not present

## 2013-01-09 DIAGNOSIS — F17200 Nicotine dependence, unspecified, uncomplicated: Secondary | ICD-10-CM | POA: Diagnosis not present

## 2013-01-09 DIAGNOSIS — R634 Abnormal weight loss: Secondary | ICD-10-CM | POA: Diagnosis not present

## 2013-01-09 DIAGNOSIS — E7800 Pure hypercholesterolemia, unspecified: Secondary | ICD-10-CM | POA: Diagnosis not present

## 2013-06-05 DIAGNOSIS — E669 Obesity, unspecified: Secondary | ICD-10-CM | POA: Diagnosis not present

## 2013-06-05 DIAGNOSIS — E7800 Pure hypercholesterolemia, unspecified: Secondary | ICD-10-CM | POA: Diagnosis not present

## 2013-06-05 DIAGNOSIS — Z0100 Encounter for examination of eyes and vision without abnormal findings: Secondary | ICD-10-CM | POA: Diagnosis not present

## 2013-06-05 DIAGNOSIS — Z1212 Encounter for screening for malignant neoplasm of rectum: Secondary | ICD-10-CM | POA: Diagnosis not present

## 2013-06-05 DIAGNOSIS — N760 Acute vaginitis: Secondary | ICD-10-CM | POA: Diagnosis not present

## 2013-06-05 DIAGNOSIS — Z01419 Encounter for gynecological examination (general) (routine) without abnormal findings: Secondary | ICD-10-CM | POA: Diagnosis not present

## 2013-06-05 DIAGNOSIS — Z0000 Encounter for general adult medical examination without abnormal findings: Secondary | ICD-10-CM | POA: Diagnosis not present

## 2013-06-05 DIAGNOSIS — Z0110 Encounter for examination of ears and hearing without abnormal findings: Secondary | ICD-10-CM | POA: Diagnosis not present

## 2013-06-05 DIAGNOSIS — E559 Vitamin D deficiency, unspecified: Secondary | ICD-10-CM | POA: Diagnosis not present

## 2013-06-05 DIAGNOSIS — R7309 Other abnormal glucose: Secondary | ICD-10-CM | POA: Diagnosis not present

## 2013-06-05 DIAGNOSIS — N952 Postmenopausal atrophic vaginitis: Secondary | ICD-10-CM | POA: Diagnosis not present

## 2013-06-05 DIAGNOSIS — Z124 Encounter for screening for malignant neoplasm of cervix: Secondary | ICD-10-CM | POA: Diagnosis not present

## 2013-07-01 DIAGNOSIS — M25539 Pain in unspecified wrist: Secondary | ICD-10-CM | POA: Diagnosis not present

## 2013-07-01 DIAGNOSIS — M76899 Other specified enthesopathies of unspecified lower limb, excluding foot: Secondary | ICD-10-CM | POA: Diagnosis not present

## 2013-07-01 DIAGNOSIS — M25559 Pain in unspecified hip: Secondary | ICD-10-CM | POA: Diagnosis not present

## 2013-07-01 DIAGNOSIS — M6740 Ganglion, unspecified site: Secondary | ICD-10-CM | POA: Diagnosis not present

## 2013-07-08 DIAGNOSIS — Z1231 Encounter for screening mammogram for malignant neoplasm of breast: Secondary | ICD-10-CM | POA: Diagnosis not present

## 2013-09-22 DIAGNOSIS — M654 Radial styloid tenosynovitis [de Quervain]: Secondary | ICD-10-CM | POA: Diagnosis not present

## 2013-09-23 DIAGNOSIS — H524 Presbyopia: Secondary | ICD-10-CM | POA: Diagnosis not present

## 2013-09-23 DIAGNOSIS — H5200 Hypermetropia, unspecified eye: Secondary | ICD-10-CM | POA: Diagnosis not present

## 2013-09-23 DIAGNOSIS — H2510 Age-related nuclear cataract, unspecified eye: Secondary | ICD-10-CM | POA: Diagnosis not present

## 2013-09-23 DIAGNOSIS — H52229 Regular astigmatism, unspecified eye: Secondary | ICD-10-CM | POA: Diagnosis not present

## 2013-12-04 DIAGNOSIS — Z6837 Body mass index (BMI) 37.0-37.9, adult: Secondary | ICD-10-CM | POA: Diagnosis not present

## 2013-12-04 DIAGNOSIS — E7800 Pure hypercholesterolemia, unspecified: Secondary | ICD-10-CM | POA: Diagnosis not present

## 2013-12-04 DIAGNOSIS — E669 Obesity, unspecified: Secondary | ICD-10-CM | POA: Diagnosis not present

## 2013-12-04 DIAGNOSIS — R7309 Other abnormal glucose: Secondary | ICD-10-CM | POA: Diagnosis not present

## 2014-03-03 DIAGNOSIS — M76899 Other specified enthesopathies of unspecified lower limb, excluding foot: Secondary | ICD-10-CM | POA: Diagnosis not present

## 2014-03-03 DIAGNOSIS — M47817 Spondylosis without myelopathy or radiculopathy, lumbosacral region: Secondary | ICD-10-CM | POA: Diagnosis not present

## 2014-03-03 DIAGNOSIS — M654 Radial styloid tenosynovitis [de Quervain]: Secondary | ICD-10-CM | POA: Diagnosis not present

## 2014-05-06 DIAGNOSIS — R300 Dysuria: Secondary | ICD-10-CM | POA: Diagnosis not present

## 2014-05-06 DIAGNOSIS — Z1389 Encounter for screening for other disorder: Secondary | ICD-10-CM | POA: Diagnosis not present

## 2014-05-06 DIAGNOSIS — Z79899 Other long term (current) drug therapy: Secondary | ICD-10-CM | POA: Diagnosis not present

## 2014-07-01 DIAGNOSIS — Z0000 Encounter for general adult medical examination without abnormal findings: Secondary | ICD-10-CM | POA: Diagnosis not present

## 2014-07-01 DIAGNOSIS — F17210 Nicotine dependence, cigarettes, uncomplicated: Secondary | ICD-10-CM | POA: Diagnosis not present

## 2014-07-01 DIAGNOSIS — E669 Obesity, unspecified: Secondary | ICD-10-CM | POA: Diagnosis not present

## 2014-07-01 DIAGNOSIS — E559 Vitamin D deficiency, unspecified: Secondary | ICD-10-CM | POA: Diagnosis not present

## 2014-07-01 DIAGNOSIS — Z720 Tobacco use: Secondary | ICD-10-CM | POA: Diagnosis not present

## 2014-07-01 DIAGNOSIS — Z6835 Body mass index (BMI) 35.0-35.9, adult: Secondary | ICD-10-CM | POA: Diagnosis not present

## 2014-07-01 DIAGNOSIS — R7309 Other abnormal glucose: Secondary | ICD-10-CM | POA: Diagnosis not present

## 2014-07-01 DIAGNOSIS — E785 Hyperlipidemia, unspecified: Secondary | ICD-10-CM | POA: Diagnosis not present

## 2014-07-22 DIAGNOSIS — Z1231 Encounter for screening mammogram for malignant neoplasm of breast: Secondary | ICD-10-CM | POA: Diagnosis not present

## 2014-07-22 DIAGNOSIS — Z780 Asymptomatic menopausal state: Secondary | ICD-10-CM | POA: Diagnosis not present

## 2014-07-22 DIAGNOSIS — Z803 Family history of malignant neoplasm of breast: Secondary | ICD-10-CM | POA: Diagnosis not present

## 2015-01-06 DIAGNOSIS — E669 Obesity, unspecified: Secondary | ICD-10-CM | POA: Diagnosis not present

## 2015-01-06 DIAGNOSIS — Z6834 Body mass index (BMI) 34.0-34.9, adult: Secondary | ICD-10-CM | POA: Diagnosis not present

## 2015-01-06 DIAGNOSIS — E780 Pure hypercholesterolemia: Secondary | ICD-10-CM | POA: Diagnosis not present

## 2015-01-06 DIAGNOSIS — G4700 Insomnia, unspecified: Secondary | ICD-10-CM | POA: Diagnosis not present

## 2015-02-25 DIAGNOSIS — M25562 Pain in left knee: Secondary | ICD-10-CM | POA: Diagnosis not present

## 2015-02-25 DIAGNOSIS — M25561 Pain in right knee: Secondary | ICD-10-CM | POA: Diagnosis not present

## 2015-03-25 DIAGNOSIS — M7062 Trochanteric bursitis, left hip: Secondary | ICD-10-CM | POA: Diagnosis not present

## 2015-03-25 DIAGNOSIS — M1711 Unilateral primary osteoarthritis, right knee: Secondary | ICD-10-CM | POA: Diagnosis not present

## 2015-03-25 DIAGNOSIS — M7061 Trochanteric bursitis, right hip: Secondary | ICD-10-CM | POA: Diagnosis not present

## 2015-03-25 DIAGNOSIS — M1712 Unilateral primary osteoarthritis, left knee: Secondary | ICD-10-CM | POA: Diagnosis not present

## 2015-04-23 ENCOUNTER — Other Ambulatory Visit: Payer: Self-pay | Admitting: Family Medicine

## 2015-04-23 DIAGNOSIS — M25551 Pain in right hip: Secondary | ICD-10-CM

## 2015-04-23 DIAGNOSIS — M545 Low back pain: Secondary | ICD-10-CM

## 2015-04-26 ENCOUNTER — Ambulatory Visit
Admission: RE | Admit: 2015-04-26 | Discharge: 2015-04-26 | Disposition: A | Discharge status: Home / Self Care | Payer: Medicare Other | Source: Ambulatory Visit | Attending: Family Medicine | Admitting: Family Medicine

## 2015-04-26 DIAGNOSIS — M1611 Unilateral primary osteoarthritis, right hip: Secondary | ICD-10-CM | POA: Diagnosis not present

## 2015-04-26 DIAGNOSIS — M4806 Spinal stenosis, lumbar region: Secondary | ICD-10-CM | POA: Diagnosis not present

## 2015-04-26 DIAGNOSIS — M545 Low back pain: Secondary | ICD-10-CM

## 2015-04-26 DIAGNOSIS — M25551 Pain in right hip: Secondary | ICD-10-CM

## 2015-04-29 DIAGNOSIS — M5137 Other intervertebral disc degeneration, lumbosacral region: Secondary | ICD-10-CM | POA: Diagnosis not present

## 2015-04-29 DIAGNOSIS — M5023 Other cervical disc displacement, cervicothoracic region: Secondary | ICD-10-CM | POA: Diagnosis not present

## 2015-05-01 DIAGNOSIS — M5020 Other cervical disc displacement, unspecified cervical region: Secondary | ICD-10-CM | POA: Diagnosis not present

## 2015-05-01 DIAGNOSIS — M5023 Other cervical disc displacement, cervicothoracic region: Secondary | ICD-10-CM | POA: Diagnosis not present

## 2015-05-04 ENCOUNTER — Other Ambulatory Visit: Payer: Self-pay | Admitting: Neurosurgery

## 2015-05-04 DIAGNOSIS — M5137 Other intervertebral disc degeneration, lumbosacral region: Secondary | ICD-10-CM | POA: Diagnosis not present

## 2015-05-04 DIAGNOSIS — M5023 Other cervical disc displacement, cervicothoracic region: Secondary | ICD-10-CM | POA: Diagnosis not present

## 2015-05-05 ENCOUNTER — Other Ambulatory Visit: Payer: Self-pay | Admitting: Neurosurgery

## 2015-05-05 DIAGNOSIS — M5023 Other cervical disc displacement, cervicothoracic region: Secondary | ICD-10-CM

## 2015-05-06 ENCOUNTER — Other Ambulatory Visit: Payer: Self-pay | Admitting: Neurosurgery

## 2015-05-06 DIAGNOSIS — R930 Abnormal findings on diagnostic imaging of skull and head, not elsewhere classified: Secondary | ICD-10-CM

## 2015-05-07 ENCOUNTER — Ambulatory Visit
Admission: RE | Admit: 2015-05-07 | Discharge: 2015-05-07 | Disposition: A | Discharge status: Home / Self Care | Payer: Medicare Other | Source: Ambulatory Visit | Attending: Neurosurgery | Admitting: Neurosurgery

## 2015-05-07 DIAGNOSIS — E236 Other disorders of pituitary gland: Secondary | ICD-10-CM | POA: Diagnosis not present

## 2015-05-07 DIAGNOSIS — M5023 Other cervical disc displacement, cervicothoracic region: Secondary | ICD-10-CM

## 2015-05-07 DIAGNOSIS — Z01818 Encounter for other preprocedural examination: Secondary | ICD-10-CM | POA: Diagnosis not present

## 2015-05-07 DIAGNOSIS — R930 Abnormal findings on diagnostic imaging of skull and head, not elsewhere classified: Secondary | ICD-10-CM

## 2015-05-07 DIAGNOSIS — M4803 Spinal stenosis, cervicothoracic region: Secondary | ICD-10-CM | POA: Diagnosis not present

## 2015-05-07 MED ORDER — GADOBENATE DIMEGLUMINE 529 MG/ML IV SOLN
10.0000 mL | Freq: Once | INTRAVENOUS | Status: AC | PRN
  Administered 2015-05-07: 10 mL via INTRAVENOUS

## 2015-05-11 ENCOUNTER — Other Ambulatory Visit (HOSPITAL_COMMUNITY): Payer: Self-pay | Admitting: Neurosurgery

## 2015-05-11 DIAGNOSIS — R930 Abnormal findings on diagnostic imaging of skull and head, not elsewhere classified: Secondary | ICD-10-CM | POA: Diagnosis not present

## 2015-05-11 DIAGNOSIS — M542 Cervicalgia: Secondary | ICD-10-CM | POA: Diagnosis not present

## 2015-05-11 DIAGNOSIS — M5126 Other intervertebral disc displacement, lumbar region: Secondary | ICD-10-CM | POA: Diagnosis not present

## 2015-05-11 DIAGNOSIS — M5137 Other intervertebral disc degeneration, lumbosacral region: Secondary | ICD-10-CM | POA: Diagnosis not present

## 2015-05-22 NOTE — Pre-Procedure Instructions (Addendum)
Julie Obrien  05/22/2015     Your procedure is scheduled on October 17.  Report to [facility] Admitting at 5:30 A.M.  Call this number if you have problems the morning of surgery:  [phone]   Remember:  Do not eat food or drink liquids after midnight.  Take these medicines the morning of surgery with A SIP OF WATER Tramadol (if needed)   STOP/ Do not take Aspirin, Aleve, Naproxen, Advil, Ibuprofen, Motrin, Vitamins, Herbs, or Supplements starting today   Do not wear jewelry, make-up or nail polish.  Do not wear lotions, powders, or perfumes.  You may wear deodorant.  Do not shave 48 hours prior to surgery.  Men may shave face and neck.  Do not bring valuables to the hospital.  [facility] is not responsible for any belongings or valuables.  Contacts, dentures or bridgework may not be worn into surgery.  Leave your suitcase in the car.  After surgery it may be brought to your room.  For patients admitted to the hospital, discharge time will be determined by your treatment team.  Patients discharged the day of surgery will not be allowed to drive home.   [facility] - Preparing for Surgery  Before surgery, you can play an important role.  Because skin is not sterile, your skin needs to be as free of germs as possible.  You can reduce the number of germs on you skin by washing with CHG (chlorahexidine gluconate) soap before surgery.  CHG is an antiseptic cleaner which kills germs and bonds with the skin to continue killing germs even after washing.  Please DO NOT use if you have an allergy to CHG or antibacterial soaps.  If your skin becomes reddened/irritated stop using the CHG and inform your nurse when you arrive at Short Stay.  Do not shave (including legs and underarms) for at least 48 hours prior to the first CHG shower.  You may shave your face.  Please follow these instructions carefully:   1.  Shower with CHG Soap the night before surgery and the  morning of Surgery.  2.  If you choose to wash your hair, wash your hair first as usual with your normal shampoo.  3.  After you shampoo, rinse your hair and body thoroughly to remove the shampoo.  4.  Use CHG as you would any other liquid soap.  You can apply CHG directly to the skin and wash gently with scrungie or a clean washcloth.  5.  Apply the CHG Soap to your body ONLY FROM THE NECK DOWN.  Do not use on open wounds or open sores.  Avoid contact with your eyes, ears, mouth and genitals (private parts).  Wash genitals (private parts) with your normal soap.  6.  Wash thoroughly, paying special attention to the area where your surgery will be performed.  7.  Thoroughly rinse your body with warm water from the neck down.  8.  DO NOT shower/wash with your normal soap after using and rinsing off the CHG Soap.  9.  Pat yourself dry with a clean towel.            10.  Wear clean pajamas.            11.  Place clean sheets on your bed the night of your first shower and do not sleep with pets.  Day of Surgery  Do not apply any lotions the morning of surgery.  Please wear  clean clothes to the hospital/surgery center.   Please read over the following fact sheets that you were given. Pain Booklet, Coughing and Deep Breathing and Surgical Site Infection Prevention

## 2015-05-24 ENCOUNTER — Encounter (HOSPITAL_COMMUNITY): Payer: Self-pay

## 2015-05-24 ENCOUNTER — Encounter (HOSPITAL_COMMUNITY)
Admission: RE | Admit: 2015-05-24 | Discharge: 2015-05-24 | Disposition: A | Discharge status: Home / Self Care | Payer: Medicare Other | Source: Ambulatory Visit | Attending: Neurosurgery | Admitting: Neurosurgery

## 2015-05-24 DIAGNOSIS — Z01812 Encounter for preprocedural laboratory examination: Secondary | ICD-10-CM | POA: Insufficient documentation

## 2015-05-24 DIAGNOSIS — Z0183 Encounter for blood typing: Secondary | ICD-10-CM | POA: Insufficient documentation

## 2015-05-24 DIAGNOSIS — M5023 Other cervical disc displacement, cervicothoracic region: Secondary | ICD-10-CM | POA: Diagnosis not present

## 2015-05-24 HISTORY — DX: Unspecified osteoarthritis, unspecified site: M19.90

## 2015-05-24 HISTORY — DX: Personal history of urinary calculi: Z87.442

## 2015-05-24 HISTORY — DX: Nausea with vomiting, unspecified: R11.2

## 2015-05-24 HISTORY — DX: Other specified postprocedural states: Z98.890

## 2015-05-24 HISTORY — DX: Adverse effect of unspecified anesthetic, initial encounter: T41.45XA

## 2015-05-24 HISTORY — DX: Other complications of anesthesia, initial encounter: T88.59XA

## 2015-05-24 LAB — SURGICAL PCR SCREEN
MRSA, PCR: NEGATIVE
STAPHYLOCOCCUS AUREUS: NEGATIVE

## 2015-05-24 LAB — BASIC METABOLIC PANEL
ANION GAP: 10 (ref 5–15)
BUN: 8 mg/dL (ref 6–20)
CALCIUM: 9.5 mg/dL (ref 8.9–10.3)
CO2: 24 mmol/L (ref 22–32)
CREATININE: 0.78 mg/dL (ref 0.44–1.00)
Chloride: 108 mmol/L (ref 101–111)
Glucose, Bld: 90 mg/dL (ref 65–99)
Potassium: 3.9 mmol/L (ref 3.5–5.1)
SODIUM: 142 mmol/L (ref 135–145)

## 2015-05-24 LAB — CBC
HCT: 41.2 % (ref 36.0–46.0)
HEMOGLOBIN: 13.4 g/dL (ref 12.0–15.0)
MCH: 26.5 pg (ref 26.0–34.0)
MCHC: 32.5 g/dL (ref 30.0–36.0)
MCV: 81.4 fL (ref 78.0–100.0)
PLATELETS: 328 10*3/uL (ref 150–400)
RBC: 5.06 MIL/uL (ref 3.87–5.11)
RDW: 15.1 % (ref 11.5–15.5)
WBC: 7.8 10*3/uL (ref 4.0–10.5)

## 2015-05-24 LAB — TYPE AND SCREEN
ABO/RH(D): O POS
ANTIBODY SCREEN: NEGATIVE

## 2015-05-30 MED ORDER — CEFAZOLIN SODIUM-DEXTROSE 2-3 GM-% IV SOLR
2.0000 g | INTRAVENOUS | Status: AC
  Administered 2015-05-31: 2 g via INTRAVENOUS
  Filled 2015-05-30: qty 50

## 2015-05-31 ENCOUNTER — Inpatient Hospital Stay (HOSPITAL_COMMUNITY)
Admission: RE | Admit: 2015-05-31 | Discharge: 2015-06-02 | DRG: 472 | Disposition: A | Discharge status: Home / Self Care | Payer: Medicare Other | Source: Ambulatory Visit | Attending: Neurosurgery | Admitting: Neurosurgery

## 2015-05-31 ENCOUNTER — Encounter (HOSPITAL_COMMUNITY)
Admission: RE | Disposition: A | Discharge status: Home / Self Care | Payer: Self-pay | Source: Ambulatory Visit | Attending: Neurosurgery

## 2015-05-31 ENCOUNTER — Inpatient Hospital Stay (HOSPITAL_COMMUNITY): Payer: Medicare Other | Admitting: Anesthesiology

## 2015-05-31 ENCOUNTER — Inpatient Hospital Stay (HOSPITAL_COMMUNITY): Payer: Medicare Other

## 2015-05-31 ENCOUNTER — Encounter (HOSPITAL_COMMUNITY): Payer: Self-pay | Admitting: *Deleted

## 2015-05-31 DIAGNOSIS — M4322 Fusion of spine, cervical region: Secondary | ICD-10-CM | POA: Diagnosis not present

## 2015-05-31 DIAGNOSIS — Z882 Allergy status to sulfonamides status: Secondary | ICD-10-CM

## 2015-05-31 DIAGNOSIS — M5023 Other cervical disc displacement, cervicothoracic region: Secondary | ICD-10-CM | POA: Diagnosis not present

## 2015-05-31 DIAGNOSIS — F17210 Nicotine dependence, cigarettes, uncomplicated: Secondary | ICD-10-CM | POA: Diagnosis present

## 2015-05-31 DIAGNOSIS — M488X3 Other specified spondylopathies, cervicothoracic region: Secondary | ICD-10-CM | POA: Diagnosis present

## 2015-05-31 DIAGNOSIS — M960 Pseudarthrosis after fusion or arthrodesis: Secondary | ICD-10-CM | POA: Diagnosis present

## 2015-05-31 DIAGNOSIS — M4712 Other spondylosis with myelopathy, cervical region: Secondary | ICD-10-CM | POA: Diagnosis present

## 2015-05-31 DIAGNOSIS — Z6835 Body mass index (BMI) 35.0-35.9, adult: Secondary | ICD-10-CM

## 2015-05-31 DIAGNOSIS — M48061 Spinal stenosis, lumbar region without neurogenic claudication: Secondary | ICD-10-CM

## 2015-05-31 DIAGNOSIS — M4802 Spinal stenosis, cervical region: Secondary | ICD-10-CM | POA: Diagnosis present

## 2015-05-31 HISTORY — PX: POSTERIOR CERVICAL FUSION/FORAMINOTOMY: SHX5038

## 2015-05-31 SURGERY — POSTERIOR CERVICAL FUSION/FORAMINOTOMY LEVEL 2
Anesthesia: General

## 2015-05-31 MED ORDER — OXYCODONE-ACETAMINOPHEN 5-325 MG PO TABS
1.0000 | ORAL_TABLET | ORAL | Status: DC | PRN
  Administered 2015-05-31 – 2015-06-02 (×11): 2 via ORAL
  Filled 2015-05-31 (×11): qty 2

## 2015-05-31 MED ORDER — SODIUM CHLORIDE 0.9 % IR SOLN
Status: DC | PRN
  Administered 2015-05-31: 08:00:00

## 2015-05-31 MED ORDER — MIDAZOLAM HCL 5 MG/5ML IJ SOLN
INTRAMUSCULAR | Status: DC | PRN
  Administered 2015-05-31: 2 mg via INTRAVENOUS

## 2015-05-31 MED ORDER — EPHEDRINE SULFATE 50 MG/ML IJ SOLN
INTRAMUSCULAR | Status: DC | PRN
  Administered 2015-05-31 (×3): 10 mg via INTRAVENOUS

## 2015-05-31 MED ORDER — LIDOCAINE-EPINEPHRINE 1 %-1:100000 IJ SOLN
INTRAMUSCULAR | Status: DC | PRN
  Administered 2015-05-31: 6 mL

## 2015-05-31 MED ORDER — VANCOMYCIN HCL 1000 MG IV SOLR
INTRAVENOUS | Status: DC | PRN
  Administered 2015-05-31: 1000 mg

## 2015-05-31 MED ORDER — TRAMADOL HCL 50 MG PO TABS: 50.0000 mg | ORAL_TABLET | Freq: Three times a day (TID) | ORAL | Status: DC | PRN

## 2015-05-31 MED ORDER — LIDOCAINE HCL (CARDIAC) 20 MG/ML IV SOLN
INTRAVENOUS | Status: DC | PRN
  Administered 2015-05-31: 100 mg via INTRAVENOUS

## 2015-05-31 MED ORDER — CEFAZOLIN SODIUM-DEXTROSE 2-3 GM-% IV SOLR
2.0000 g | Freq: Three times a day (TID) | INTRAVENOUS | Status: AC
Start: 2015-05-31 — End: 2015-06-02
  Administered 2015-05-31 – 2015-06-02 (×6): 2 g via INTRAVENOUS
  Filled 2015-05-31 (×6): qty 50

## 2015-05-31 MED ORDER — PROPOFOL 10 MG/ML IV BOLUS
INTRAVENOUS | Status: AC
Start: 2015-05-31 — End: 2015-05-31
  Filled 2015-05-31: qty 20

## 2015-05-31 MED ORDER — NEOSTIGMINE METHYLSULFATE 10 MG/10ML IV SOLN
INTRAVENOUS | Status: DC | PRN
  Administered 2015-05-31: 4 mg via INTRAVENOUS

## 2015-05-31 MED ORDER — SODIUM CHLORIDE 0.9 % IJ SOLN: 3.0000 mL | INTRAMUSCULAR | Status: DC | PRN

## 2015-05-31 MED ORDER — SUCCINYLCHOLINE CHLORIDE 20 MG/ML IJ SOLN
INTRAMUSCULAR | Status: AC
  Filled 2015-05-31: qty 1

## 2015-05-31 MED ORDER — HYDROCODONE-ACETAMINOPHEN 5-325 MG PO TABS: 1.0000 | ORAL_TABLET | Freq: Four times a day (QID) | ORAL | Status: DC | PRN

## 2015-05-31 MED ORDER — LACTATED RINGERS IV SOLN
INTRAVENOUS | Status: DC | PRN
  Administered 2015-05-31 (×2): via INTRAVENOUS

## 2015-05-31 MED ORDER — BACITRACIN ZINC 500 UNIT/GM EX OINT
TOPICAL_OINTMENT | CUTANEOUS | Status: DC | PRN
  Administered 2015-05-31: 1 via TOPICAL

## 2015-05-31 MED ORDER — OXYCODONE HCL 5 MG PO TABS: 5.0000 mg | ORAL_TABLET | Freq: Once | ORAL | Status: DC | PRN

## 2015-05-31 MED ORDER — ONDANSETRON HCL 4 MG/2ML IJ SOLN: 4.0000 mg | INTRAMUSCULAR | Status: DC | PRN

## 2015-05-31 MED ORDER — ACETAMINOPHEN 650 MG RE SUPP: 650.0000 mg | RECTAL | Status: DC | PRN

## 2015-05-31 MED ORDER — ACETAMINOPHEN 160 MG/5ML PO SOLN
325.0000 mg | ORAL | Status: DC | PRN
  Filled 2015-05-31: qty 20.3

## 2015-05-31 MED ORDER — HYDROMORPHONE HCL 1 MG/ML IJ SOLN
INTRAMUSCULAR | Status: AC
  Filled 2015-05-31: qty 1

## 2015-05-31 MED ORDER — ARTIFICIAL TEARS OP OINT
TOPICAL_OINTMENT | OPHTHALMIC | Status: AC
  Filled 2015-05-31: qty 3.5

## 2015-05-31 MED ORDER — FENTANYL CITRATE (PF) 250 MCG/5ML IJ SOLN
INTRAMUSCULAR | Status: AC
Start: 2015-05-31 — End: 2015-05-31
  Filled 2015-05-31: qty 5

## 2015-05-31 MED ORDER — NEOSTIGMINE METHYLSULFATE 10 MG/10ML IV SOLN
INTRAVENOUS | Status: AC
  Filled 2015-05-31: qty 1

## 2015-05-31 MED ORDER — GLYCOPYRROLATE 0.2 MG/ML IJ SOLN
INTRAMUSCULAR | Status: AC
  Filled 2015-05-31: qty 4

## 2015-05-31 MED ORDER — EPHEDRINE SULFATE 50 MG/ML IJ SOLN
INTRAMUSCULAR | Status: AC
  Filled 2015-05-31: qty 1

## 2015-05-31 MED ORDER — SODIUM CHLORIDE 0.9 % IV SOLN: 250.0000 mL | INTRAVENOUS | Status: DC

## 2015-05-31 MED ORDER — MENTHOL 3 MG MT LOZG: 1.0000 | LOZENGE | OROMUCOSAL | Status: DC | PRN

## 2015-05-31 MED ORDER — PHENYLEPHRINE HCL 10 MG/ML IJ SOLN
INTRAMUSCULAR | Status: DC | PRN
  Administered 2015-05-31 (×2): 40 ug via INTRAVENOUS
  Administered 2015-05-31: 80 ug via INTRAVENOUS
  Administered 2015-05-31: 40 ug via INTRAVENOUS
  Administered 2015-05-31 (×3): 80 ug via INTRAVENOUS
  Administered 2015-05-31 (×3): 40 ug via INTRAVENOUS

## 2015-05-31 MED ORDER — PROPOFOL 10 MG/ML IV BOLUS
INTRAVENOUS | Status: AC
  Filled 2015-05-31: qty 20

## 2015-05-31 MED ORDER — PHENOL 1.4 % MT LIQD: 1.0000 | OROMUCOSAL | Status: DC | PRN

## 2015-05-31 MED ORDER — PROPOFOL 10 MG/ML IV BOLUS
INTRAVENOUS | Status: DC | PRN
  Administered 2015-05-31: 100 mg via INTRAVENOUS

## 2015-05-31 MED ORDER — SODIUM CHLORIDE 0.9 % IJ SOLN
3.0000 mL | Freq: Two times a day (BID) | INTRAMUSCULAR | Status: DC
  Administered 2015-05-31 – 2015-06-01 (×3): 3 mL via INTRAVENOUS

## 2015-05-31 MED ORDER — HYDROMORPHONE HCL 1 MG/ML IJ SOLN
0.2500 mg | INTRAMUSCULAR | Status: DC | PRN
  Administered 2015-05-31 (×3): 0.5 mg via INTRAVENOUS

## 2015-05-31 MED ORDER — HYDROMORPHONE HCL 1 MG/ML IJ SOLN
0.5000 mg | INTRAMUSCULAR | Status: DC | PRN
  Administered 2015-05-31 – 2015-06-01 (×3): 1 mg via INTRAVENOUS
  Filled 2015-05-31 (×3): qty 1

## 2015-05-31 MED ORDER — ONDANSETRON HCL 4 MG/2ML IJ SOLN
INTRAMUSCULAR | Status: DC | PRN
  Administered 2015-05-31: 4 mg via INTRAVENOUS

## 2015-05-31 MED ORDER — THROMBIN 20000 UNITS EX SOLR
CUTANEOUS | Status: DC | PRN
  Administered 2015-05-31: 09:00:00 via TOPICAL

## 2015-05-31 MED ORDER — MIDAZOLAM HCL 2 MG/2ML IJ SOLN
INTRAMUSCULAR | Status: AC
Start: 2015-05-31 — End: 2015-05-31
  Filled 2015-05-31: qty 4

## 2015-05-31 MED ORDER — FENTANYL CITRATE (PF) 100 MCG/2ML IJ SOLN
INTRAMUSCULAR | Status: DC | PRN
  Administered 2015-05-31: 100 ug via INTRAVENOUS
  Administered 2015-05-31 (×3): 50 ug via INTRAVENOUS

## 2015-05-31 MED ORDER — ARTIFICIAL TEARS OP OINT
TOPICAL_OINTMENT | OPHTHALMIC | Status: DC | PRN
  Administered 2015-05-31: 1 via OPHTHALMIC

## 2015-05-31 MED ORDER — DEXAMETHASONE SODIUM PHOSPHATE 10 MG/ML IJ SOLN
INTRAMUSCULAR | Status: DC | PRN
  Administered 2015-05-31: 10 mg via INTRAVENOUS

## 2015-05-31 MED ORDER — SODIUM CHLORIDE 0.9 % IJ SOLN
INTRAMUSCULAR | Status: AC
  Filled 2015-05-31: qty 10

## 2015-05-31 MED ORDER — PROMETHAZINE HCL 25 MG/ML IJ SOLN
INTRAMUSCULAR | Status: AC
  Administered 2015-05-31: 6.25 mg
  Filled 2015-05-31: qty 1

## 2015-05-31 MED ORDER — OXYCODONE HCL 5 MG/5ML PO SOLN: 5.0000 mg | Freq: Once | ORAL | Status: DC | PRN

## 2015-05-31 MED ORDER — VANCOMYCIN HCL 1000 MG IV SOLR
INTRAVENOUS | Status: AC
  Filled 2015-05-31: qty 1000

## 2015-05-31 MED ORDER — HYDROMORPHONE HCL 1 MG/ML IJ SOLN
INTRAMUSCULAR | Status: AC
  Administered 2015-05-31: 0.5 mg via INTRAVENOUS
  Filled 2015-05-31: qty 1

## 2015-05-31 MED ORDER — 0.9 % SODIUM CHLORIDE (POUR BTL) OPTIME
TOPICAL | Status: DC | PRN
  Administered 2015-05-31: 1000 mL

## 2015-05-31 MED ORDER — DEXAMETHASONE SODIUM PHOSPHATE 10 MG/ML IJ SOLN
INTRAMUSCULAR | Status: AC
  Filled 2015-05-31: qty 1

## 2015-05-31 MED ORDER — CYCLOBENZAPRINE HCL 10 MG PO TABS
10.0000 mg | ORAL_TABLET | Freq: Three times a day (TID) | ORAL | Status: DC | PRN
  Administered 2015-05-31 – 2015-06-02 (×4): 10 mg via ORAL
  Filled 2015-05-31 (×4): qty 1

## 2015-05-31 MED ORDER — LIDOCAINE HCL (CARDIAC) 20 MG/ML IV SOLN
INTRAVENOUS | Status: AC
  Filled 2015-05-31: qty 5

## 2015-05-31 MED ORDER — DOCUSATE SODIUM 100 MG PO CAPS
100.0000 mg | ORAL_CAPSULE | Freq: Two times a day (BID) | ORAL | Status: DC
  Administered 2015-05-31 – 2015-06-02 (×5): 100 mg via ORAL
  Filled 2015-05-31 (×5): qty 1

## 2015-05-31 MED ORDER — ROCURONIUM BROMIDE 50 MG/5ML IV SOLN
INTRAVENOUS | Status: AC
  Filled 2015-05-31: qty 2

## 2015-05-31 MED ORDER — ONDANSETRON HCL 4 MG/2ML IJ SOLN
INTRAMUSCULAR | Status: AC
  Filled 2015-05-31: qty 2

## 2015-05-31 MED ORDER — ACETAMINOPHEN 325 MG PO TABS: 650.0000 mg | ORAL_TABLET | ORAL | Status: DC | PRN

## 2015-05-31 MED ORDER — ACETAMINOPHEN 325 MG PO TABS: 325.0000 mg | ORAL_TABLET | ORAL | Status: DC | PRN

## 2015-05-31 MED ORDER — ROCURONIUM BROMIDE 100 MG/10ML IV SOLN
INTRAVENOUS | Status: DC | PRN
  Administered 2015-05-31: 50 mg via INTRAVENOUS
  Administered 2015-05-31 (×2): 10 mg via INTRAVENOUS

## 2015-05-31 MED ORDER — BUPIVACAINE HCL (PF) 0.25 % IJ SOLN
INTRAMUSCULAR | Status: DC | PRN
  Administered 2015-05-31: 7 mL

## 2015-05-31 MED ORDER — GLYCOPYRROLATE 0.2 MG/ML IJ SOLN
INTRAMUSCULAR | Status: DC | PRN
  Administered 2015-05-31: 0.6 mg via INTRAVENOUS

## 2015-05-31 SURGICAL SUPPLY — 70 items
BAG DECANTER FOR FLEXI CONT (MISCELLANEOUS) ×3 IMPLANT
BENZOIN TINCTURE PRP APPL 2/3 (GAUZE/BANDAGES/DRESSINGS) ×3 IMPLANT
BIT DRILL SCRW 3.5 (BIT) ×3 IMPLANT
BLADE CLIPPER SURG (BLADE) ×3 IMPLANT
BLADE SURG 11 STRL SS (BLADE) ×3 IMPLANT
BONE ALLOSTEM MORSELIZED 5CC (Bone Implant) ×3 IMPLANT
BUR MATCHSTICK NEURO 3.0 LAGG (BURR) ×3 IMPLANT
CANISTER SUCT 3000ML PPV (MISCELLANEOUS) ×3 IMPLANT
CAP LOCKING (Cap) ×9 IMPLANT
CLOSURE WOUND 1/2 X4 (GAUZE/BANDAGES/DRESSINGS) ×1
DECANTER SPIKE VIAL GLASS SM (MISCELLANEOUS) ×3 IMPLANT
DRAPE C-ARM 42X72 X-RAY (DRAPES) IMPLANT
DRAPE LAPAROTOMY 100X72 PEDS (DRAPES) ×3 IMPLANT
DRAPE MICROSCOPE LEICA (MISCELLANEOUS) IMPLANT
DRAPE POUCH INSTRU U-SHP 10X18 (DRAPES) ×3 IMPLANT
DRAPE SURG 17X23 STRL (DRAPES) ×12 IMPLANT
DRSG OPSITE 4X5.5 SM (GAUZE/BANDAGES/DRESSINGS) ×3 IMPLANT
DRSG OPSITE POSTOP 4X8 (GAUZE/BANDAGES/DRESSINGS) ×3 IMPLANT
DURAPREP 26ML APPLICATOR (WOUND CARE) ×3 IMPLANT
ELECT REM PT RETURN 9FT ADLT (ELECTROSURGICAL) ×3
ELECTRODE REM PT RTRN 9FT ADLT (ELECTROSURGICAL) ×1 IMPLANT
GAUZE SPONGE 4X4 12PLY STRL (GAUZE/BANDAGES/DRESSINGS) ×3 IMPLANT
GAUZE SPONGE 4X4 16PLY XRAY LF (GAUZE/BANDAGES/DRESSINGS) ×3 IMPLANT
GLOVE BIO SURGEON STRL SZ8 (GLOVE) ×6 IMPLANT
GLOVE ECLIPSE 9.0 STRL (GLOVE) ×3 IMPLANT
GLOVE EXAM NITRILE LRG STRL (GLOVE) IMPLANT
GLOVE EXAM NITRILE MD LF STRL (GLOVE) IMPLANT
GLOVE EXAM NITRILE XL STR (GLOVE) IMPLANT
GLOVE EXAM NITRILE XS STR PU (GLOVE) IMPLANT
GLOVE INDICATOR 7.5 STRL GRN (GLOVE) ×9 IMPLANT
GLOVE INDICATOR 8.5 STRL (GLOVE) ×3 IMPLANT
GLOVE SURG SS PI 7.0 STRL IVOR (GLOVE) ×9 IMPLANT
GOWN STRL REUS W/ TWL LRG LVL3 (GOWN DISPOSABLE) IMPLANT
GOWN STRL REUS W/ TWL XL LVL3 (GOWN DISPOSABLE) ×3 IMPLANT
GOWN STRL REUS W/TWL 2XL LVL3 (GOWN DISPOSABLE) IMPLANT
GOWN STRL REUS W/TWL LRG LVL3 (GOWN DISPOSABLE)
GOWN STRL REUS W/TWL XL LVL3 (GOWN DISPOSABLE) ×6
IMPL QUARTEX 3.5X12MM (Neuro Prosthesis/Implant) ×4 IMPLANT
IMPL QUARTEX 3.5X14MM (Neuro Prosthesis/Implant) ×4 IMPLANT
IMPLANT QUARTEX 3.5X12MM (Neuro Prosthesis/Implant) ×12 IMPLANT
IMPLANT QUARTEX 3.5X14MM (Neuro Prosthesis/Implant) ×12 IMPLANT
KIT BASIN OR (CUSTOM PROCEDURE TRAY) ×3 IMPLANT
KIT INFUSE X SMALL 1.4CC (Orthopedic Implant) ×3 IMPLANT
KIT ROOM TURNOVER OR (KITS) ×3 IMPLANT
LIQUID BAND (GAUZE/BANDAGES/DRESSINGS) ×3 IMPLANT
LOCKING CAP (Cap) ×27 IMPLANT
MARKER SKIN DUAL TIP RULER LAB (MISCELLANEOUS) ×3 IMPLANT
NEEDLE HYPO 25X1 1.5 SAFETY (NEEDLE) ×3 IMPLANT
NEEDLE SPNL 20GX3.5 QUINCKE YW (NEEDLE) ×3 IMPLANT
NS IRRIG 1000ML POUR BTL (IV SOLUTION) ×3 IMPLANT
PACK LAMINECTOMY NEURO (CUSTOM PROCEDURE TRAY) ×3 IMPLANT
PAD ARMBOARD 7.5X6 YLW CONV (MISCELLANEOUS) ×9 IMPLANT
PIN MAYFIELD SKULL DISP (PIN) ×6 IMPLANT
QUARTEX IMPLANT 3.5X18MM (Neuro Prosthesis/Implant) ×3 IMPLANT
ROD ELLIPSE 40MMX3.5 (Rod) ×6 IMPLANT
ROD SPINE POST 3.5X80 (Rod) ×3 IMPLANT
RUBBERBAND STERILE (MISCELLANEOUS) IMPLANT
SCREW QUARTEX IMPLANT 3.5X18MM (Neuro Prosthesis/Implant) ×1 IMPLANT
SPONGE LAP 4X18 X RAY DECT (DISPOSABLE) IMPLANT
SPONGE SURGIFOAM ABS GEL 100 (HEMOSTASIS) ×3 IMPLANT
STRIP CLOSURE SKIN 1/2X4 (GAUZE/BANDAGES/DRESSINGS) ×2 IMPLANT
SUT ETHILON 4 0 PS 2 18 (SUTURE) IMPLANT
SUT VIC AB 0 CT1 18XCR BRD8 (SUTURE) ×1 IMPLANT
SUT VIC AB 0 CT1 8-18 (SUTURE) ×2
SUT VIC AB 2-0 CT1 18 (SUTURE) ×6 IMPLANT
SUT VICRYL 4-0 PS2 18IN ABS (SUTURE) ×3 IMPLANT
TOWEL OR 17X24 6PK STRL BLUE (TOWEL DISPOSABLE) ×3 IMPLANT
TOWEL OR 17X26 10 PK STRL BLUE (TOWEL DISPOSABLE) ×3 IMPLANT
TRAY FOLEY W/METER SILVER 14FR (SET/KITS/TRAYS/PACK) IMPLANT
WATER STERILE IRR 1000ML POUR (IV SOLUTION) ×3 IMPLANT

## 2015-05-31 NOTE — Anesthesia Procedure Notes (Signed)
Procedure Name: Intubation Performed by: Catelyn Friel E Pre-anesthesia Checklist: Patient identified, Emergency Drugs available, Suction available, Patient being monitored and Timeout performed Patient Re-evaluated:Patient Re-evaluated prior to inductionOxygen Delivery Method: Circle system utilized Preoxygenation: Pre-oxygenation with 100% oxygen Intubation Type: IV induction Ventilation: Mask ventilation without difficulty Laryngoscope Size: Miller and 2 Grade View: Grade I Tube type: Oral Tube size: 7.5 mm Number of attempts: 1 Airway Equipment and Method: Stylet Placement Confirmation: ETT inserted through vocal cords under direct vision,  positive ETCO2 and breath sounds checked- equal and bilateral Secured at: 22 cm Tube secured with: Tape Dental Injury: Teeth and Oropharynx as per pre-operative assessment      

## 2015-05-31 NOTE — Op Note (Signed)
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  Postoperative diagnosis: Same  Procedure: Decompressive cervical laminectomy with foraminotomies at C3-4, C6-7, C7-T1.  #2 posterior cervical fusion with lateral mass screws C3-4, and C6-T1.  #3 posterior lateral arthrodesis C 34 C6-T1 utilizing locally harvested autograft mixed with allostem morsels and BMP  #4 placement of a medium Hemovac drain  Surgeon: Dominica Severin Keyon Winnick  Asst.: Mallie Mussel pool  Anesthesia: Gen.  EBL: Minimal  History of present illness: Patient is a very pleasant 64 year old female is a neck pain but worsening pain numbness tingling and weakness in her arms and hands worse on the right. Workup revealed severe stenosis and pseudoarthrosis at C3-4 with posterior ligament hypertrophy and osteophytosis. 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 Due to patient's progression of clinical syndrome imaging findings and today conservative treatment I recommended decompressive cervical laminectomies at C3-4, C6-7, C7-T1. As well as subsequent fusion. Axis I of the risks and benefits of the operation with the patient as well as perioperative course expectations of outcome and alternatives of surgery she understood and agreed to proceed forward.  Operative procedure: Patient brought into the or was induced under general anesthesia positioned prone in pins in the neck in slight flexion and incision was drawn up after adequate prepping and draping centrally from C2 down to T2 and after infiltration of 6 mL lidocaine with epi midline incision was made and Bovie cautery was used to calcification subperiosteal dissection carried lamina of C3 down through T1. Interoperative x-ray confirmed the depth gauge and appropriate level so the spines process C3 was removed laminotomy was begun. Marked stenosis was  appreciated laterally at this level using a 2 minute Kerrison punch aggressive abutting lateral gutters and decompressed from the inferior aspect of the to 3 interspace down through the top of the 4 lamina. The entire C3 spinal and a competent was removed and the superior one third of C4. Then lateral mass screws are placed at C3 and C4 from an inferomedial to the superior outer quadrant all screws excellent purchase then tensioned taken the C67 and C7-T1. In a similar fashion lateral mass screws or plate and placed at A1-2 bilaterally spinous processes at C6 and C7 were removed complete laminectomy of 7 partial inferior one half of C6 and superior one third of T1 was carried out more conservative in the left side it was a spermatic 4 I did an aggressive foraminotomy the C8 nerve root due to a large calcified spur anteriorly decompress the C8 neural foramen. Under biting both gutters identified the T1 pedicle and looking at the medial border of the T1 pedicle identified an entry point using comminution of AP and lateral fluoroscopy and drilled and placed an 18 mm 4.0 screw in the T1 pedicle on the right. A conservative decompression of the left did not feel like to place a T1 pedicle screw on the left due to potential injury to left C8 nerve root that was not symptomatic. After all the screws and placed 2 rods were contoured the wound scope was irrigated aggressive decortication was care this in the facet joints at C3-4 C5-6 C6-7 and C7-T1. Then BMP and locally harvested autograft was packed posterior laterally along the lateral masses from C3-4 effectively down to T1. Then rods were then contoured cut and placed all nuts were anchored in place fusion did appear to be solid the Gelfoam was laid up the  dura was then correction a Hemovac drain was placed and the wounds closed in layers with Vicryl after spreading vancomycin powder both subfascially and it should fashion. Then the skin was closed running 4 subcuticular  Dermabond benzo and Steri-Strips sterile dressings were applied patient recovered in stable condition. At the end of case all needle counts sponge counts were correct.

## 2015-05-31 NOTE — Progress Notes (Signed)
Orthopedic Tech Progress Note Patient Details:  MEKA LEWAN 05/19/1951 [id number]  Ortho Devices Type of Ortho Device: Soft collar Ortho Device/Splint Location: neck Ortho Device/Splint Interventions: Ordered As ordered by Dr. Estevan Ryder, Margia Wiesen 05/31/2015, 10:52 AM

## 2015-05-31 NOTE — Progress Notes (Signed)
Utilization review completed.  

## 2015-05-31 NOTE — Anesthesia Preprocedure Evaluation (Addendum)
Anesthesia Evaluation  Patient identified by MRN, date of birth, ID band Patient awake    Reviewed: Allergy & Precautions, NPO status , Patient's Chart, lab work & pertinent test results  History of Anesthesia Complications (+) PONV and history of anesthetic complications  Airway Mallampati: II  TM Distance: >3 FB Neck ROM: Full    Dental  (+) Partial Lower, Partial Upper   Pulmonary neg shortness of breath, neg sleep apnea, neg COPD, neg recent URI, Current Smoker,    breath sounds clear to auscultation       Cardiovascular negative cardio ROS   Rhythm:Regular     Neuro/Psych neg Seizures  Neuromuscular disease negative psych ROS   GI/Hepatic negative GI ROS, Neg liver ROS,   Endo/Other  Morbid obesity  Renal/GU negative Renal ROS     Musculoskeletal  (+) Arthritis ,   Abdominal   Peds  Hematology negative hematology ROS (+)   Anesthesia Other Findings   Reproductive/Obstetrics                            Anesthesia Physical Anesthesia Plan  ASA: II  Anesthesia Plan: General   Post-op Pain Management:    Induction: Intravenous  Airway Management Planned: Oral ETT  Additional Equipment: None  Intra-op Plan:   Post-operative Plan: Extubation in OR  Informed Consent: I have reviewed the patients History and Physical, chart, labs and discussed the procedure including the risks, benefits and alternatives for the proposed anesthesia with the patient or authorized representative who has indicated his/her understanding and acceptance.   Dental advisory given  Plan Discussed with: CRNA and Surgeon  Anesthesia Plan Comments:         Anesthesia Quick Evaluation

## 2015-05-31 NOTE — H&P (Signed)
Julie Obrien is an 64 y.o. female.   Chief Complaint: Neck pain arm tingling and numbness HPI: Patient is very pleasant 64 year old female has had progressive worsening neck pain with bilateral arm and hand tingling numbness and weakness. She also had some low back pain and difficulty walking. Workup revealed severe cervical stenosis at C3-4 with a pseudoarthrosis as well as severe stenosis at C6-7 and C7-T1 with evidence of OPLL. Due to her cord compression cervical stenosis failure conservative treatment progression of clinical syndrome, I recommended decompression and fusion at C3-4 and C6-T1. I extensively went over the risks and benefits of the operation with the patient as well as perioperative course expectations of outcome and alternatives to surgery and she understands and agrees to proceed forward.  Past Medical History  Diagnosis Date  . Complication of anesthesia     nausea one surgery more recent  . PONV (postoperative nausea and vomiting)   . Arthritis   . History of kidney stones     Past Surgical History  Procedure Laterality Date  . Cesarean section      x4  . Tonsillectomy    . Cysto  99    stone  . Cervical discectomy  09  . Back surgery  08  . Cyst excision      vocal cords?    History reviewed. No pertinent family history. Social History:  reports that she has been smoking Cigarettes.  She has a 8.5 pack-year smoking history. She does not have any smokeless tobacco history on file. She reports that she does not drink alcohol or use illicit drugs.  Allergies:  Allergies  Allergen Reactions  . Sulfa Antibiotics Hives    Medications Prior to Admission  Medication Sig Dispense Refill  . HYDROcodone-acetaminophen (NORCO/VICODIN) 5-325 MG tablet Take 1 tablet by mouth every 6 (six) hours as needed for moderate pain.    . traMADol (ULTRAM) 50 MG tablet Take 50 mg by mouth 3 (three) times daily as needed for moderate pain.      No results found for this or  any previous visit (from the past 48 hour(s)). No results found.  Review of Systems  Constitutional: Negative.   Eyes: Negative.   Respiratory: Negative.   Cardiovascular: Negative.   Gastrointestinal: Negative.   Genitourinary: Negative.   Musculoskeletal: Positive for myalgias, back pain and neck pain.  Skin: Negative.   Neurological: Positive for tingling and sensory change.  Psychiatric/Behavioral: Negative.     Blood pressure 114/66, pulse 70, temperature 98.3 F (36.8 C), temperature source Oral, resp. rate 18, SpO2 98 %. Physical Exam  Constitutional: She is oriented to person, place, and time. She appears well-developed and well-nourished.  Eyes: Pupils are equal, round, and reactive to light.  Neck: Normal range of motion.  Respiratory: Effort normal.  GI: Soft.  Neurological: She is alert and oriented to person, place, and time.  Strength is 5 out of 5 in her deltoid, bicep, tricep, wrist flexion, wrist extension, and intrinsics.     Assessment/Plan 64 year female presents for decompression and fusion C3-4 and C6-T1.  Aiesha Leland P 05/31/2015, 7:16 AM

## 2015-05-31 NOTE — Evaluation (Signed)
Physical Therapy Evaluation Patient Details Name: Julie Obrien MRN: [id number] DOB: 09-09-1950 Today's Date: 05/31/2015   History of Present Illness  patient is a 64 yo female s/p C3-4, C6-7, C7-T1  Clinical Impression  Patient demonstrates deficits in functional mobility as indicated below. Will benefit from continued skilled PT to address deficits and maximize function. Will see as indicated and progress as tolerated.     Follow Up Recommendations Supervision for mobility/OOB;Supervision/Assistance - 24 hour    Equipment Recommendations   (TBD)    Recommendations for Other Services OT consult     Precautions / Restrictions Precautions Precautions: Cervical;Fall Precaution Comments: provided hand out regarding cervical precautions, verbally educated some aspects of precautions Required Braces or Orthoses: Cervical Brace Cervical Brace: Hard collar Restrictions Weight Bearing Restrictions: No      Mobility  Bed Mobility Overal bed mobility: Needs Assistance Bed Mobility: Supine to Sit;Sit to Supine     Supine to sit: Min assist;HOB elevated Sit to supine: Min assist   General bed mobility comments: Increased time secondary to pain, Educated regarding technique.  Transfers Overall transfer level: Needs assistance Equipment used: 1 person hand held assist Transfers: Sit to/from Stand Sit to Stand: Min assist         General transfer comment: Min assist for stability, very slow and guarded with transition increased pain with mobility  Ambulation/Gait Ambulation/Gait assistance: Min assist Ambulation Distance (Feet): 40 Feet Assistive device: 1 person hand held assist (and pushing IV pole) Gait Pattern/deviations: Step-through pattern;Decreased stride length;Shuffle;Narrow base of support Gait velocity: decreased Gait velocity interpretation: <1.8 ft/sec, indicative of risk for recurrent falls General Gait Details: very slow guarded steps for mobility,  assist for stabilization and pacing. Patient cautious with all aspects secondary to pain  Stairs            Wheelchair Mobility    Modified Rankin (Stroke Patients Only)       Balance                                             Pertinent Vitals/Pain Pain Assessment: Faces Pain Score: 6  Pain Location: neck Pain Descriptors / Indicators: Grimacing;Guarding;Operative site guarding Pain Intervention(s): Limited activity within patient's tolerance;Monitored during session;Premedicated before session;Repositioned    Home Living Family/patient expects to be discharged to:: Private residence Living Arrangements: Spouse/significant other Available Help at Discharge: Family Type of Home: House Home Access: Stairs to enter   Technical brewer of Steps: 1 Home Layout: Two level;Bed/bath upstairs Home Equipment: None      Prior Function Level of Independence: Independent               Hand Dominance   Dominant Hand: Right    Extremity/Trunk Assessment   Upper Extremity Assessment: Defer to OT evaluation           Lower Extremity Assessment: Overall WFL for tasks assessed         Communication   Communication: No difficulties  Cognition Arousal/Alertness: Awake/alert Behavior During Therapy: Flat affect Overall Cognitive Status: Within Functional Limits for tasks assessed                      General Comments      Exercises        Assessment/Plan    PT Assessment Patient needs continued PT services  PT Diagnosis Difficulty walking;Acute pain  PT Problem List Decreased activity tolerance;Decreased balance;Decreased mobility;Pain  PT Treatment Interventions DME instruction;Gait training;Stair training;Functional mobility training;Therapeutic activities;Therapeutic exercise;Patient/family education   PT Goals (Current goals can be found in the Care Plan section) Acute Rehab PT Goals Patient Stated Goal: to go  home PT Goal Formulation: With patient Time For Goal Achievement: 06/14/15 Potential to Achieve Goals: Good    Frequency Min 5X/week   Barriers to discharge        Co-evaluation               End of Session Equipment Utilized During Treatment: Gait belt;Cervical collar Activity Tolerance: Patient limited by pain Patient left: with call bell/phone within reach;with family/visitor present Nurse Communication: Mobility status         Time: 1855-0158 PT Time Calculation (min) (ACUTE ONLY): 18 min   Charges:   PT Evaluation $Initial PT Evaluation Tier I: 1 Procedure     PT G CodesDuncan Dull 2015-06-29, 5:19 PM Alben Deeds, [place] DPT  [phone]

## 2015-05-31 NOTE — Transfer of Care (Signed)
Immediate Anesthesia Transfer of Care Note  Patient: Julie Obrien  Procedure(s) Performed: Procedure(s): Decompression and Laminectomy Cervical three-four  Cervical six-seven  Cervical seven-thoracic-one Posterior cervical fusion with lateral mass fixation (N/A)  Patient Location: PACU  Anesthesia Type:General  Level of Consciousness: awake, alert , oriented and sedated  Airway & Oxygen Therapy: Patient Spontanous Breathing and Patient connected to nasal cannula oxygen  Post-op Assessment: Report given to RN, Post -op Vital signs reviewed and stable and Patient moving all extremities  Post vital signs: Reviewed and stable  Last Vitals:  Filed Vitals:   05/31/15 0636  BP: 114/66  Pulse: 70  Temp: 36.8 C  Resp: 18    Complications: No apparent anesthesia complications

## 2015-06-01 ENCOUNTER — Encounter (HOSPITAL_COMMUNITY): Payer: Self-pay | Admitting: Neurosurgery

## 2015-06-01 LAB — URINALYSIS, ROUTINE W REFLEX MICROSCOPIC
BILIRUBIN URINE: NEGATIVE
GLUCOSE, UA: NEGATIVE mg/dL
HGB URINE DIPSTICK: NEGATIVE
Ketones, ur: NEGATIVE mg/dL
Leukocytes, UA: NEGATIVE
Nitrite: NEGATIVE
PH: 6.5 (ref 5.0–8.0)
Protein, ur: NEGATIVE mg/dL
SPECIFIC GRAVITY, URINE: 1.01 (ref 1.005–1.030)
UROBILINOGEN UA: 0.2 mg/dL (ref 0.0–1.0)

## 2015-06-01 MED ORDER — ALUM & MAG HYDROXIDE-SIMETH 200-200-20 MG/5ML PO SUSP
30.0000 mL | Freq: Four times a day (QID) | ORAL | Status: DC | PRN
  Administered 2015-06-01: 30 mL via ORAL
  Filled 2015-06-01: qty 30

## 2015-06-01 NOTE — Progress Notes (Signed)
Physical Therapy Treatment Patient Details Name: Julie Obrien MRN: [id number] DOB: Apr 02, 1951 Today's Date: 06/01/2015    History of Present Illness patient is a 64 yo female s/p C3-4, C6-7, C7-T1    PT Comments    Pt progressing towards physical therapy goals. Was motivated to participate, however fatigued quickly, especially after stair training. Discussed some energy conservation techniques for home, and pt will need a RW prior to d/c. Will continue to follow and progress as able per POC.   Follow Up Recommendations  Supervision for mobility/OOB;Outpatient PT (When appropriate per post-op protocol.)     Equipment Recommendations  Rolling walker with 5" wheels    Recommendations for Other Services OT consult     Precautions / Restrictions Precautions Precautions: Cervical;Fall Precaution Comments: Reviewed cervical precautions with pt  Required Braces or Orthoses: Cervical Brace Cervical Brace: Hard collar Restrictions Weight Bearing Restrictions: No    Mobility  Bed Mobility Overal bed mobility: Needs Assistance Bed Mobility: Sidelying to Sit   Sidelying to sit: Min guard       General bed mobility comments: Pt sidelying when PT arrived, and was able to elevate trunk to full sitting position with min guard assist and VC's for technique.   Transfers Overall transfer level: Needs assistance Equipment used: Rolling walker (2 wheeled);1 person hand held assist Transfers: Sit to/from Stand Sit to Stand: Min assist;Min guard         General transfer comment: Assist without RW for balance, and close guard for safety with RW/UE support.   Ambulation/Gait Ambulation/Gait assistance: Min guard Ambulation Distance (Feet): 125 Feet Assistive device: Rolling walker (2 wheeled) Gait Pattern/deviations: Step-through pattern;Decreased stride length;Trunk flexed Gait velocity: Decreased Gait velocity interpretation: Below normal speed for age/gender General Gait  Details: Grossly slow, guarded gait. Pt appears more comfortable with RW use, but fatigues quickly.    Stairs Stairs: Yes Stairs assistance: Min assist Stair Management: One rail Right;Step to pattern;Sideways Number of Stairs: 10 General stair comments: Pt motivated to attempt full flight of steps as she will have to do this to get to her bedroom/bathroom at home. Pt was instructed to negotiate stairs sideways for BUE support. Steadying assist provided.   Wheelchair Mobility    Modified Rankin (Stroke Patients Only)       Balance Overall balance assessment: Needs assistance Sitting-balance support: Feet supported;No upper extremity supported Sitting balance-Leahy Scale: Fair     Standing balance support: No upper extremity supported;During functional activity Standing balance-Leahy Scale: Fair Standing balance comment: Washing hands at sink                    Cognition Arousal/Alertness: Awake/alert Behavior During Therapy: Flat affect Overall Cognitive Status: Within Functional Limits for tasks assessed                      Exercises      General Comments        Pertinent Vitals/Pain Pain Assessment: Faces Faces Pain Scale: Hurts little more Pain Location: Neck Pain Descriptors / Indicators: Operative site guarding;Grimacing Pain Intervention(s): Limited activity within patient's tolerance;Monitored during session;Repositioned    Home Living                      Prior Function            PT Goals (current goals can now be found in the care plan section) Acute Rehab PT Goals Patient Stated Goal: to go home PT Goal  Formulation: With patient Time For Goal Achievement: 06/14/15 Potential to Achieve Goals: Good Progress towards PT goals: Progressing toward goals    Frequency  Min 5X/week    PT Plan Current plan remains appropriate    Co-evaluation             End of Session Equipment Utilized During Treatment: Gait  belt;Cervical collar Activity Tolerance: Patient limited by pain;Patient limited by fatigue Patient left: in chair;with call bell/phone within reach     Time: 0751-0823 PT Time Calculation (min) (ACUTE ONLY): 32 min  Charges:  $Gait Training: 23-37 mins                    G Codes:  Functional Assessment Tool Used:     Rolinda Roan 06/01/2015, 9:42 AM   Rolinda Roan, PT, DPT Acute Rehabilitation Services Pager: [phone]

## 2015-06-01 NOTE — Evaluation (Signed)
Occupational Therapy Evaluation & Discharge Patient Details Name: Julie Obrien MRN: [id number] DOB: 1951/02/26 Today's Date: 06/01/2015    History of Present Illness 64 y.o. female s/p decompressive cervical laminectomy with foraminotomies at C3-4, C6-7 and C7-T1. PMH: hx of kidney stones, arthritis and PONV.   Clinical Impression   Pt admitted to hospital due to reason stated above. Pt currently with functional limitiations due to deficits listed below (see OT problem list). Prior to admission pt was independent with all ADLs. Pt currently requires supervision to minimal assistance for safety with ADLs. Pt was educated in body mechanics and positioning following cervical spine surgery and was able to recall using teach back method. Pt reported daughters will be available to assist her upon discharge for two weeks, and husband will be able to assist PRN. All education complete, no further OT acute needs required, however pt will benefit from intermittent supervision to maximize her independence and safety with ADLs/IADLs while maintaining cervical precautions to remain safe at home.    Follow Up Recommendations  Supervision - Intermittent    Equipment Recommendations  3 in 1 bedside comode    Recommendations for Other Services       Precautions / Restrictions Precautions Precautions: Cervical;Fall Precaution Comments: Reviewed cervical precautions with pt  Required Braces or Orthoses: Cervical Brace Cervical Brace: Hard collar Restrictions Weight Bearing Restrictions: No      Mobility Bed Mobility Overal bed mobility: Needs Assistance Bed Mobility: Sidelying to Sit   Sidelying to sit: Min guard       General bed mobility comments: Pt up in chair upon arrival, however therapist reviewed over log roll technique to assist with maintaining precautions.  Transfers Overall transfer level: Needs assistance Equipment used: Rolling walker (2 wheeled) Transfers: Sit to/from  Stand Sit to Stand: Min guard         General transfer comment: rolling walker for UE support, pt slow to rise and verbal cues for hand placement and safety    Balance Overall balance assessment: Needs assistance Sitting-balance support: No upper extremity supported;Feet supported Sitting balance-Leahy Scale: Fair     Standing balance support: No upper extremity supported;During functional activity Standing balance-Leahy Scale: Fair Standing balance comment: Pt able to stand without UE support to perform grooming task                            ADL Overall ADL's : Needs assistance/impaired Eating/Feeding: Independent;Sitting   Grooming: Wash/dry hands;Wash/dry face;Min guard;Standing   Upper Body Bathing: Minimal assitance;Sitting   Lower Body Bathing: Minimal assistance;Sitting/lateral leans;Sit to/from stand Lower Body Bathing Details (indicate cue type and reason): Pt required min A for doffing sock Upper Body Dressing : Minimal assistance;Sitting   Lower Body Dressing: Minimal assistance;Sitting/lateral leans;Sit to/from stand   Toilet Transfer: Min guard;Ambulation;RW;Comfort height toilet   Toileting- Clothing Manipulation and Hygiene: Supervision/safety;Sit to/from stand   Tub/ Shower Transfer: Walk-in shower;Min guard;Ambulation;Rolling walker Tub/Shower Transfer Details (indicate cue type and reason): verbal cues for how to properly entering in and out of walk-in-shower, crosses over 1"threshold Functional mobility during ADLs: Min guard;Rolling walker General ADL Comments: Pt educated in adaptive equipement to assist with ADL independence. Pt able to return safe demonstration of sock-aid while maintianing cervical precautions     Vision     Perception     Praxis      Pertinent Vitals/Pain Pain Assessment: Faces Faces Pain Scale: Hurts little more Pain Location: neck Pain Descriptors /  Indicators: Operative site guarding;Grimacing Pain  Intervention(s): Monitored during session;Repositioned     Hand Dominance Right   Extremity/Trunk Assessment Upper Extremity Assessment Upper Extremity Assessment: Overall WFL for tasks assessed   Lower Extremity Assessment Lower Extremity Assessment: Defer to PT evaluation   Cervical / Trunk Assessment Cervical / Trunk Assessment: Normal   Communication Communication Communication: No difficulties   Cognition Arousal/Alertness: Awake/alert Behavior During Therapy: Flat affect Overall Cognitive Status: Within Functional Limits for tasks assessed                     General Comments    Pt educated in body mechanics and positioning for ADL and IADL safety and educated in adaptive equipment.     Exercises       Shoulder Instructions      Home Living Family/patient expects to be discharged to:: Private residence Living Arrangements: Spouse/significant other Available Help at Discharge: Family;Available PRN/intermittently Type of Home: House Home Access: Stairs to enter [employer] of Steps: 1 Entrance Stairs-Rails: Right Home Layout: Two level;Bed/bath upstairs;Able to live on main level with bedroom/bathroom Alternate Level Stairs-Number of Steps: 16 Alternate Level Stairs-Rails: Right;Left Bathroom Shower/Tub: Tub/shower unit;Walk-in shower Shower/tub characteristics: Curtain Biochemist, clinical: Standard Bathroom Accessibility: Yes   Home Equipment: Cane - single point   Additional Comments: Pt reported having two dogs, pt educated on how to maintain cervical precuations during pet care.      Prior Functioning/Environment Level of Independence: Independent             OT Diagnosis: Generalized weakness;Acute pain   OT Problem List: Decreased strength;Impaired balance (sitting and/or standing);Decreased knowledge of use of DME or AE;Decreased knowledge of precautions;Pain   OT Treatment/Interventions:      OT Goals(Current goals can be  found in the care plan section) Acute Rehab OT Goals Patient Stated Goal: to go home  OT Frequency:     Barriers to D/C:            Co-evaluation              End of Session Equipment Utilized During Treatment: Gait belt;Rolling walker;Cervical collar  Activity Tolerance: Patient tolerated treatment well Patient left: in chair;with call bell/phone within reach   Time: 0912-0947 OT Time Calculation (min): 35 min Charges:  OT General Charges $OT Visit: 1 Procedure OT Evaluation $Initial OT Evaluation Tier I: 1 Procedure OT Treatments $Self Care/Home Management : 8-22 mins G-Codes:    Lin Landsman 06-03-2015, 11:23 AM

## 2015-06-01 NOTE — Progress Notes (Signed)
Subjective: Patient reports Doing well severe pain in the back of her neck as well as over her right flank in her back. Hands feel much better  Objective: Vital signs in last 24 hours: Temp:  [97.7 F (36.5 C)-98.3 F (36.8 C)] 98.3 F (36.8 C) (10/18 0413) Pulse Rate:  [67-83] 73 (10/18 0413) Resp:  [17-26] 18 (10/18 0413) BP: (110-135)/(54-63) 135/57 mmHg (10/18 0413) SpO2:  [90 %-99 %] 94 % (10/18 0413)  Intake/Output from previous day: 10/17 0701 - 10/18 0700 In: 2180 [P.O.:480; I.V.:1700] Out: 1780 [Urine:1400; Drains:180; Blood:200] Intake/Output this shift:    Strength out of 5 wound clean dry and intact  Lab Results: No results for input(s): WBC, HGB, HCT, PLT in the last 72 hours. BMET No results for input(s): NA, K, CL, CO2, GLUCOSE, BUN, CREATININE, CALCIUM in the last 72 hours.  Studies/Results: Dg Cervical Spine 2-3 Views  05/31/2015  CLINICAL DATA:  Cervical spine fusion. EXAM: CERVICAL SPINE  4+ VIEWS COMPARISON:  None. FINDINGS: New cervical spine fusion. Multiple pedicle screws are noted throughout the cervical spine. Hardware intact. Visualized portion of the cervical spine appear to be aligned. Five images obtained. 0 minutes 35 seconds fluoroscopy time. IMPRESSION: Cervical spine fusion. Electronically Signed   By: Marcello Moores  Register   On: 05/31/2015 11:15   Dg C-arm 1-60 Min  05/31/2015  CLINICAL DATA:  Cervical spine fusion. EXAM: CERVICAL SPINE  4+ VIEWS COMPARISON:  None. FINDINGS: New cervical spine fusion. Multiple pedicle screws are noted throughout the cervical spine. Hardware intact. Visualized portion of the cervical spine appear to be aligned. Five images obtained. 0 minutes 35 seconds fluoroscopy time. IMPRESSION: Cervical spine fusion. Electronically Signed   By: Marcello Moores  Register   On: 05/31/2015 11:15    Assessment/Plan: Mobilized today with physical and occupational therapy we'll check a urine urinalysis secondary to her CVA tenderness.  LOS: 1  day     Tahjir Silveria P 06/01/2015, 7:41 AM

## 2015-06-02 LAB — URINE CULTURE: CULTURE: NO GROWTH

## 2015-06-02 MED ORDER — OXYCODONE-ACETAMINOPHEN 5-325 MG PO TABS: 1.0000 | ORAL_TABLET | ORAL | Status: DC | PRN

## 2015-06-02 MED ORDER — CYCLOBENZAPRINE HCL 10 MG PO TABS: 10.0000 mg | ORAL_TABLET | Freq: Three times a day (TID) | ORAL | Status: DC | PRN

## 2015-06-02 NOTE — Progress Notes (Signed)
Physical Therapy Treatment Patient Details Name: Julie Obrien MRN: [id number] DOB: Feb 14, 1951 Today's Date: 06/02/2015    History of Present Illness 64 y.o. female s/p decompressive cervical laminectomy with foraminotomies at C3-4, C6-7 and C7-T1. PMH: hx of kidney stones, arthritis and PONV.    PT Comments    Pt progressing towards physical therapy goals. Was able to perform transfers and ambulation with min guard assist to supervision for balance/support. Overall ambulating with increased gait speed this session but continues to fatigue quickly. Will continue to follow and progress as able per POC.   Follow Up Recommendations  Outpatient PT (When appropriate per post-op protocol)     Equipment Recommendations  Rolling walker with 5" wheels;3in1 (PT)    Recommendations for Other Services       Precautions / Restrictions Precautions Precautions: Cervical;Fall Precaution Comments: Reviewed cervical precautions with pt  Required Braces or Orthoses: Cervical Brace Cervical Brace: Hard collar Restrictions Weight Bearing Restrictions: No    Mobility  Bed Mobility Overal bed mobility: Needs Assistance Bed Mobility: Rolling;Sidelying to Sit Rolling: Supervision Sidelying to sit: Min guard       General bed mobility comments: Pt was cued for sequencing and technique to perform log roll. Increased difficulty elevating trunk to full sitting position and min guard was provided.   Transfers Overall transfer level: Needs assistance Equipment used: Rolling walker (2 wheeled) Transfers: Sit to/from Stand Sit to Stand: Supervision         General transfer comment: VC's for hand placement on seated surface for safety as pt wanted to pull up to standing from the walker. No assist or cues required on second attempt later in session.   Ambulation/Gait Ambulation/Gait assistance: Supervision Ambulation Distance (Feet): 125 Feet Assistive device: Rolling walker (2  wheeled) Gait Pattern/deviations: Step-through pattern;Decreased stride length;Trunk flexed Gait velocity: Increased from last session however still grossly decreased. Gait velocity interpretation: Below normal speed for age/gender General Gait Details: Slow, guarded gait. Pt moving with increased gait speed this session.   Stairs Stairs: Yes Stairs assistance: Min guard Stair Management: One rail Right;Step to pattern;Sideways Number of Stairs: 8 General stair comments: Pt motivated to attempt full flight of steps as she will have to do this to get to her bedroom/bathroom at home. Pt was instructed to negotiate stairs sideways for BUE support. Steadying assist provided. Therapist limited number of stairs for energy conservation.   Wheelchair Mobility    Modified Rankin (Stroke Patients Only)       Balance Overall balance assessment: Needs assistance Sitting-balance support: Feet supported;No upper extremity supported Sitting balance-Leahy Scale: Fair     Standing balance support: During functional activity;No upper extremity supported Standing balance-Leahy Scale: Fair Standing balance comment: Dynamic balance while putting robe on in standing.                     Cognition Arousal/Alertness: Awake/alert Behavior During Therapy: Flat affect Overall Cognitive Status: Within Functional Limits for tasks assessed                      Exercises      General Comments        Pertinent Vitals/Pain Pain Assessment: Faces Faces Pain Scale: Hurts little more Pain Location: Neck/back Pain Descriptors / Indicators: Operative site guarding;Discomfort Pain Intervention(s): Limited activity within patient's tolerance;Monitored during session;Repositioned    Home Living  Prior Function            PT Goals (current goals can now be found in the care plan section) Acute Rehab PT Goals Patient Stated Goal: to go home PT Goal  Formulation: With patient Time For Goal Achievement: 06/14/15 Potential to Achieve Goals: Good Progress towards PT goals: Progressing toward goals    Frequency  Min 5X/week    PT Plan Current plan remains appropriate    Co-evaluation             End of Session Equipment Utilized During Treatment: Gait belt;Cervical collar Activity Tolerance: Patient limited by fatigue Patient left: in chair;with call bell/phone within reach     Time: 0725-0748 PT Time Calculation (min) (ACUTE ONLY): 23 min  Charges:  $Gait Training: 23-37 mins                    G Codes:      Rolinda Roan 2015/06/08, 9:00 AM   Rolinda Roan, PT, DPT Acute Rehabilitation Services Pager: [phone]

## 2015-06-02 NOTE — Discharge Instructions (Signed)
Wound Care  Keep the incision clean and dry remove the outer dressing in 2 days, leave the Steri-Strips intact. Wrap with Saran wrap for showers only Do not put any creams, lotions, or ointments on incision. Leave steri-strips on neck.  They will fall off by themselves.  Activity Walk each and every day, increasing distance each day. No lifting greater than 5 lbs.  Avoid excessive neck motion. No lifting no bending no twisting no driving or riding a car unless coming back and forth to see me. Wear neck brace at all times except when showering.   Diet Resume your normal diet.   Return to Work Will be discussed at you follow up appointment.  Call Your Doctor If Any of These Occur Redness, drainage, or swelling at the wound.  Temperature greater than 101 degrees. Severe pain not relieved by pain medication. Incision starts to come apart. Follow Up Appt Call today for appointment in 1-2 weeks ([phone]) or for problems.  If you have any hardware placed in your spine, you will need an x-ray before your appointment.  Wound Care Keep incision covered and dry for one week.  If you shower prior to then, cover incision with plastic wrap.  You may remove outer bandage after one week and shower.  Do not put any creams, lotions, or ointments on incision. Leave steri-strips on neck.  They will fall off by themselves. Activity Walk each and every day, increasing distance each day. No lifting greater than 5 lbs.  Avoid excessive neck motion. No driving for 2 weeks; may ride as a passenger locally. Wear neck brace at all times except when showering or otherwise instructed. Diet Resume your normal diet.  Return to Work Will be discussed at you follow up appointment. Call Your Doctor If Any of These Occur Redness, drainage, or swelling at the wound.  Temperature greater than 101 degrees. Severe pain not relieved by pain medication. Increased difficulty swallowing.  Incision starts to come  apart. Follow Up Appt Call today for appointment in 1-2 weeks ([phone]) or for problems.  If you have any hardware placed in your spine, you will need an x-ray before your appointment.

## 2015-06-02 NOTE — Progress Notes (Signed)
Pt and daughter given D/C instructions with Rx's, verbal understanding was provided. Pt's Hemovac was removed prior to D/C and dressing was changed. Pt's incision is clean and dry with no sign of infection. Pt's IV was removed prior to D/C. Pt received RW and 3-n-1 from [place]. Pt D/C'd home via wheelchair per MD order. Pt is stable @ D/C and has no other needs at this time. Holli Humbles, RN

## 2015-06-02 NOTE — Discharge Summary (Signed)
  Physician Discharge Summary  Patient ID: Julie Obrien MRN: [id number] DOB/AGE: 04-05-1951 64 y.o.  Admit date: 05/31/2015 Discharge date: 06/02/2015  Admission Diagnoses: Cervical spondylitic myelopathy and pseudoarthrosis  Discharge Diagnoses: Same Active Problems:   Myelopathy, spondylogenic, cervical   Discharged Condition: good  Hospital Course: Patient to the hospital one posterior cervical decompression laminectomy and fusion did very well the first couple postoperative days was emanating voiding spontaneously tolerating regular diet and stable for discharge home. Patient be discharged her scheduled follow-up in one to 2 weeks.  Consults: Significant Diagnostic Studies: Treatments: Posterior cervical decompression and fusion. Discharge Exam: Blood pressure 116/57, pulse 73, temperature 98.5 F (36.9 C), temperature source Oral, resp. rate 20, SpO2 95 %. Strength out of 5 wound clean dry and intact.  Disposition: Home     Medication List    TAKE these medications        cyclobenzaprine 10 MG tablet  Commonly known as:  FLEXERIL  Take 1 tablet (10 mg total) by mouth 3 (three) times daily as needed for muscle spasms.     HYDROcodone-acetaminophen 5-325 MG tablet  Commonly known as:  NORCO/VICODIN  Take 1 tablet by mouth every 6 (six) hours as needed for moderate pain.     oxyCODONE-acetaminophen 5-325 MG tablet  Commonly known as:  PERCOCET/ROXICET  Take 1-2 tablets by mouth every 4 (four) hours as needed for moderate pain.     traMADol 50 MG tablet  Commonly known as:  ULTRAM  Take 50 mg by mouth 3 (three) times daily as needed for moderate pain.           Follow-up Information    Follow up with [facility], MD.   Specialty:  Neurosurgery   Contact information:   [address] [place] [address] [phone]       Follow up with Elaina Hoops, MD.   Specialty:  Neurosurgery   Contact information:   [address] [place] [address] [phone]       Signed: Elaina Hoops 06/02/2015, 8:43 AM

## 2015-06-02 NOTE — Progress Notes (Signed)
Patient ID: Julie Obrien, female   DOB: [birth year], 65 y.o.   MRN: [id number] Doing well discharged home  Neurologic stable incision clean dry and intact

## 2015-06-03 NOTE — Anesthesia Postprocedure Evaluation (Signed)
  Anesthesia Post-op Note  Patient: Julie Obrien  Procedure(s) Performed: Procedure(s): Decompression and Laminectomy Cervical three-four  Cervical six-seven  Cervical seven-thoracic-one Posterior cervical fusion with lateral mass fixation (N/A)  Patient Location: PACU  Anesthesia Type:General  Level of Consciousness: awake  Airway and Oxygen Therapy: Patient Spontanous Breathing  Post-op Pain: mild  Post-op Assessment: Post-op Vital signs reviewed, Patient's Cardiovascular Status Stable, Respiratory Function Stable, Patent Airway, No signs of Nausea or vomiting and Pain level controlled LLE Motor Response: Purposeful movement   RLE Motor Response: Purposeful movement        Post-op Vital Signs: Reviewed and stable  Last Vitals:  Filed Vitals:   06/02/15 1204  BP: 105/49  Pulse: 65  Temp: 37.1 C  Resp: 18    Complications: No apparent anesthesia complications

## 2015-06-07 ENCOUNTER — Encounter (HOSPITAL_COMMUNITY): Payer: Self-pay | Admitting: Neurosurgery

## 2015-06-09 ENCOUNTER — Emergency Department (HOSPITAL_COMMUNITY)
Admission: EM | Admit: 2015-06-09 | Discharge: 2015-06-10 | Disposition: A | Discharge status: Home / Self Care | Payer: Medicare Other | Attending: Emergency Medicine | Admitting: Emergency Medicine

## 2015-06-09 ENCOUNTER — Encounter (HOSPITAL_COMMUNITY): Payer: Self-pay | Admitting: Emergency Medicine

## 2015-06-09 DIAGNOSIS — M1990 Unspecified osteoarthritis, unspecified site: Secondary | ICD-10-CM | POA: Insufficient documentation

## 2015-06-09 DIAGNOSIS — Z87442 Personal history of urinary calculi: Secondary | ICD-10-CM | POA: Diagnosis not present

## 2015-06-09 DIAGNOSIS — R202 Paresthesia of skin: Secondary | ICD-10-CM | POA: Diagnosis not present

## 2015-06-09 DIAGNOSIS — Z720 Tobacco use: Secondary | ICD-10-CM | POA: Diagnosis not present

## 2015-06-09 DIAGNOSIS — R200 Anesthesia of skin: Secondary | ICD-10-CM | POA: Insufficient documentation

## 2015-06-09 DIAGNOSIS — R209 Unspecified disturbances of skin sensation: Secondary | ICD-10-CM | POA: Diagnosis not present

## 2015-06-09 DIAGNOSIS — M542 Cervicalgia: Secondary | ICD-10-CM | POA: Insufficient documentation

## 2015-06-09 NOTE — ED Notes (Signed)
Pt. reports posterior lower neck pain onset this evening radiating to right arm and fingers , recent cervical surgery last 05/31/2015 by Dr. Saintclair Halsted . Denies fall or strenuous activity.

## 2015-06-09 NOTE — ED Provider Notes (Signed)
CSN: [id number]     Arrival date & time 06/09/15  2342 History  By signing my name below, I, Julie Obrien, attest that this documentation has been prepared under the direction and in the presence of Julie Rice, MD. Electronically Signed: Eustaquio Obrien, ED Scribe. 06/10/2015. 12:14 AM.  Chief Complaint  Patient presents with  . Neck Pain   The history is provided by the patient. No language interpreter was used.     HPI Comments: Julie Obrien is a 64 y.o. female who presents to the Emergency Department complaining of sudden onset, constant, posterior neck pain that began approximately 45 minutes ago.  Pt states she was bending down when she heard a pop in her neck, causing the pain. She took Hydrocodone after onset without relief. Pt had posterior cervical fusion/foraminotomy on 05/31/2015 (approximately 10 days ago) by Dr. Saintclair Halsted. Pt states she has been doing fine since the surgery until today. She had right arm tingling before the surgery but states it resolved after the surgery. When pt began having neck pain today, the tingling to her right hand returned. She has been alternating between soft and hard cervical collar since the surgery. Denies nausea, vomiting, numbness, weakness, or any other associated symptoms.   Past Medical History  Diagnosis Date  . Complication of anesthesia     nausea one surgery more recent  . PONV (postoperative nausea and vomiting)   . Arthritis   . History of kidney stones    Past Surgical History  Procedure Laterality Date  . Cesarean section      x4  . Tonsillectomy    . Cysto  99    stone  . Cervical discectomy  09  . Back surgery  08  . Cyst excision      vocal cords?  . Posterior cervical fusion/foraminotomy N/A 05/31/2015    Procedure: Decompression and Laminectomy Cervical three-four  Cervical six-seven  Cervical seven-thoracic-one Posterior cervical fusion with lateral mass fixation;  Surgeon: Kary Kos, MD;  Location: [facility];   Service: Neurosurgery;  Laterality: N/A;   No family history on file. Social History  Substance Use Topics  . Smoking status: Current Every Day Smoker -- 0.00 packs/day for 17 years    Types: Cigarettes  . Smokeless tobacco: None  . Alcohol Use: No   OB History    No data available     Review of Systems  Constitutional: Negative for fever and chills.  Respiratory: Negative for shortness of breath.   Cardiovascular: Negative for chest pain.  Gastrointestinal: Negative for nausea, vomiting and abdominal pain.  Musculoskeletal: Positive for neck pain. Negative for back pain.  Skin: Negative for rash and wound.  Neurological: Positive for numbness. Negative for dizziness, weakness and headaches.       + Paresthesia to right hand  All other systems reviewed and are negative.  Allergies  Sulfa antibiotics  Home Medications   Prior to Admission medications   Medication Sig Start Date End Date Taking? Authorizing Provider  cyclobenzaprine (FLEXERIL) 10 MG tablet Take 1 tablet (10 mg total) by mouth 3 (three) times daily as needed for muscle spasms. 06/02/15   Kary Kos, MD  HYDROcodone-acetaminophen (NORCO/VICODIN) 5-325 MG tablet Take 1 tablet by mouth every 6 (six) hours as needed for moderate pain.    Historical Provider, MD  oxyCODONE-acetaminophen (PERCOCET/ROXICET) 5-325 MG tablet Take 1-2 tablets by mouth every 4 (four) hours as needed for moderate pain. 06/02/15   Kary Kos, MD  traMADol Veatrice Bourbon)  50 MG tablet Take 50 mg by mouth 3 (three) times daily as needed for moderate pain.    Historical Provider, MD   Triage Vitals: BP 139/75 mmHg  Pulse 103  Temp(Src) 98 F (36.7 C) (Oral)  Resp 16  SpO2 97%   Physical Exam  Constitutional: She is oriented to person, place, and time. She appears well-developed and well-nourished. No distress.  HENT:  Head: Normocephalic and atraumatic.  Mouth/Throat: Oropharynx is clear and moist.  Eyes: EOM are normal. Pupils are equal, round,  and reactive to light.  Neck: Normal range of motion. Neck supple.  Patient with well-healing midline posterior cervical surgical scar. No evidence of erythema or discharge. Patient does have tenderness to palpation over the middle of the cervical spine.   Cardiovascular: Normal rate and regular rhythm.   Pulmonary/Chest: Effort normal and breath sounds normal. No respiratory distress. She has no wheezes. She has no rales.  Abdominal: Soft. Bowel sounds are normal. She exhibits no distension and no mass. There is no tenderness. There is no rebound and no guarding.  Musculoskeletal: Normal range of motion. She exhibits no edema or tenderness.  Distal pulses are equal bilaterally.  Lymphadenopathy:    She has no cervical adenopathy.  Neurological: She is alert and oriented to person, place, and time.  Patient is alert and oriented x3 with clear, goal oriented speech. Patient has 5/5 motor in all extremities. Patient with paresthesias to the right thumb, index finger and over the triceps muscle, otherwise sensation intact to  Skin: Skin is warm and dry. No rash noted. No erythema.  Psychiatric: She has a normal mood and affect. Her behavior is normal.  Nursing note and vitals reviewed.   ED Course  Procedures (including critical care time)  DIAGNOSTIC STUDIES: Oxygen Saturation is 97% on RA, normal by my interpretation.    COORDINATION OF CARE: 12:08 AM-Discussed treatment plan which includes DG C Spine and pain medication with pt at bedside and pt agreed to plan.   Labs Review Labs Reviewed - No data to display  Imaging Review Dg Cervical Spine Complete  06/10/2015  CLINICAL DATA:  64 year old female with history of cervical spinal fusion on October 17th. Felt a pop tonight while folding sheets. Numbness and tingling in the right thumb and index finger. EXAM: CERVICAL SPINE - COMPLETE 4+ VIEW COMPARISON:  05/31/2015. FINDINGS: Again noted is complete bony fusion from C4-C6.  Postoperative changes of ACDF at C3-C4 with well incorporated interbody graft at C3-C4. Posterior rod and pedicle screw fixation devices are in place at C3-C4 bilateral, and at C6-T1 on the right and C6-C7 on the left. No definite hardware fracture or other acute complicating features are identified on today's plain film examination. Alignment appears anatomic. Prevertebral soft tissues are grossly normal. IMPRESSION: 1. No acute radiographic abnormality of the cervical spine. Extensive postoperative changes, as above. Electronically Signed   By: Vinnie Langton M.D.   On: 06/10/2015 00:53   I have personally reviewed and evaluated these images as part of my medical decision-making.   EKG Interpretation None      MDM   Final diagnoses:  Posterior neck pain    I personally performed the services described in this documentation, which was scribed in my presence. The recorded information has been reviewed and is accurate.      Patient states she is feeling much better after IM medication. X-ray without any evidence of hardware abnormalities. Patient is encouraged to call her neurosurgeon in the morning to  arrange follow-up. She's been given return precautions for worsening pain, fever, increased numbness, weakness or for any concerns.  Julie Rice, MD 06/10/15 0130

## 2015-06-10 ENCOUNTER — Emergency Department (HOSPITAL_COMMUNITY): Payer: Medicare Other

## 2015-06-10 DIAGNOSIS — R200 Anesthesia of skin: Secondary | ICD-10-CM | POA: Diagnosis not present

## 2015-06-10 MED ORDER — HYDROMORPHONE HCL 1 MG/ML IJ SOLN
1.0000 mg | Freq: Once | INTRAMUSCULAR | Status: AC
Start: 2015-06-10 — End: 2015-06-10
  Administered 2015-06-10: 1 mg via INTRAMUSCULAR
  Filled 2015-06-10: qty 1

## 2015-06-10 NOTE — Discharge Instructions (Signed)
°  Radicular Pain Radicular pain in either the arm or leg is usually from a bulging or herniated disk in the spine. A piece of the herniated disk may press against the nerves as the nerves exit the spine. This causes pain which is felt at the tips of the nerves down the arm or leg. Other causes of radicular pain may include:  Fractures.  Heart disease.  Cancer.  An abnormal and usually degenerative state of the nervous system or nerves (neuropathy). Diagnosis may require CT or MRI scanning to determine the primary cause.  Nerves that start at the neck (nerve roots) may cause radicular pain in the outer shoulder and arm. It can spread down to the thumb and fingers. The symptoms vary depending on which nerve root has been affected. In most cases radicular pain improves with conservative treatment. Neck problems may require physical therapy, a neck collar, or cervical traction. Treatment may take many weeks, and surgery may be considered if the symptoms do not improve.  Conservative treatment is also recommended for sciatica. Sciatica causes pain to radiate from the lower back or buttock area down the leg into the foot. Often there is a history of back problems. Most patients with sciatica are better after 2 to 4 weeks of rest and other supportive care. Short term bed rest can reduce the disk pressure considerably. Sitting, however, is not a good position since this increases the pressure on the disk. You should avoid bending, lifting, and all other activities which make the problem worse. Traction can be used in severe cases. Surgery is usually reserved for patients who do not improve within the first months of treatment. Only take over-the-counter or prescription medicines for pain, discomfort, or fever as directed by your caregiver. Narcotics and muscle relaxants may help by relieving more severe pain and spasm and by providing mild sedation. Cold or massage can give significant relief. Spinal  manipulation is not recommended. It can increase the degree of disc protrusion. Epidural steroid injections are often effective treatment for radicular pain. These injections deliver medicine to the spinal nerve in the space between the protective covering of the spinal cord and back bones (vertebrae). Your caregiver can give you more information about steroid injections. These injections are most effective when given within two weeks of the onset of pain.  You should see your caregiver for follow up care as recommended. A program for neck and back injury rehabilitation with stretching and strengthening exercises is an important part of management.  SEEK IMMEDIATE MEDICAL CARE IF:  You develop increased pain, weakness, or numbness in your arm or leg.  You develop difficulty with bladder or bowel control.  You develop abdominal pain.   This information is not intended to replace advice given to you by your health care provider. Make sure you discuss any questions you have with your health care provider.   Document Released: 09/07/2004 Document Revised: 08/21/2014 Document Reviewed: 02/24/2015 Elsevier Interactive Patient Education [employer].

## 2015-06-10 NOTE — ED Notes (Signed)
Dr. Yelverton at the bedside.  

## 2015-06-17 ENCOUNTER — Other Ambulatory Visit: Payer: Self-pay | Admitting: Neurosurgery

## 2015-06-17 DIAGNOSIS — M542 Cervicalgia: Secondary | ICD-10-CM

## 2015-06-18 ENCOUNTER — Encounter (HOSPITAL_COMMUNITY): Payer: Self-pay | Admitting: *Deleted

## 2015-06-18 ENCOUNTER — Ambulatory Visit
Admission: RE | Admit: 2015-06-18 | Discharge: 2015-06-18 | Disposition: A | Discharge status: Home / Self Care | Payer: Medicare Other | Source: Ambulatory Visit | Attending: Neurosurgery | Admitting: Neurosurgery

## 2015-06-18 ENCOUNTER — Other Ambulatory Visit (HOSPITAL_COMMUNITY): Payer: Self-pay | Admitting: Neurosurgery

## 2015-06-18 DIAGNOSIS — M4322 Fusion of spine, cervical region: Secondary | ICD-10-CM | POA: Diagnosis not present

## 2015-06-18 DIAGNOSIS — M542 Cervicalgia: Secondary | ICD-10-CM

## 2015-06-21 ENCOUNTER — Encounter (HOSPITAL_COMMUNITY)
Admission: RE | Disposition: A | Discharge status: Home / Self Care | Payer: Self-pay | Source: Ambulatory Visit | Attending: Neurosurgery

## 2015-06-21 ENCOUNTER — Inpatient Hospital Stay (HOSPITAL_COMMUNITY)
Admission: RE | Admit: 2015-06-21 | Discharge: 2015-06-22 | DRG: 041 | Disposition: A | Discharge status: Home / Self Care | Payer: Medicare Other | Source: Ambulatory Visit | Attending: Neurosurgery | Admitting: Neurosurgery

## 2015-06-21 ENCOUNTER — Encounter (HOSPITAL_COMMUNITY): Payer: Self-pay | Admitting: General Practice

## 2015-06-21 ENCOUNTER — Inpatient Hospital Stay (HOSPITAL_COMMUNITY): Payer: Medicare Other | Admitting: Anesthesiology

## 2015-06-21 DIAGNOSIS — M5414 Radiculopathy, thoracic region: Secondary | ICD-10-CM | POA: Diagnosis present

## 2015-06-21 DIAGNOSIS — X58XXXA Exposure to other specified factors, initial encounter: Secondary | ICD-10-CM | POA: Diagnosis present

## 2015-06-21 DIAGNOSIS — Z79899 Other long term (current) drug therapy: Secondary | ICD-10-CM | POA: Diagnosis not present

## 2015-06-21 DIAGNOSIS — F17210 Nicotine dependence, cigarettes, uncomplicated: Secondary | ICD-10-CM | POA: Diagnosis present

## 2015-06-21 DIAGNOSIS — S22019A Unspecified fracture of first thoracic vertebra, initial encounter for closed fracture: Secondary | ICD-10-CM | POA: Diagnosis present

## 2015-06-21 DIAGNOSIS — Z882 Allergy status to sulfonamides status: Secondary | ICD-10-CM | POA: Diagnosis not present

## 2015-06-21 DIAGNOSIS — S22009A Unspecified fracture of unspecified thoracic vertebra, initial encounter for closed fracture: Secondary | ICD-10-CM | POA: Diagnosis present

## 2015-06-21 DIAGNOSIS — Z79891 Long term (current) use of opiate analgesic: Secondary | ICD-10-CM

## 2015-06-21 DIAGNOSIS — T84428A Displacement of other internal orthopedic devices, implants and grafts, initial encounter: Secondary | ICD-10-CM | POA: Diagnosis not present

## 2015-06-21 DIAGNOSIS — M5412 Radiculopathy, cervical region: Principal | ICD-10-CM | POA: Diagnosis present

## 2015-06-21 DIAGNOSIS — M542 Cervicalgia: Secondary | ICD-10-CM | POA: Diagnosis not present

## 2015-06-21 DIAGNOSIS — M4802 Spinal stenosis, cervical region: Secondary | ICD-10-CM | POA: Diagnosis not present

## 2015-06-21 DIAGNOSIS — M1990 Unspecified osteoarthritis, unspecified site: Secondary | ICD-10-CM | POA: Diagnosis not present

## 2015-06-21 DIAGNOSIS — T84226A Displacement of internal fixation device of vertebrae, initial encounter: Secondary | ICD-10-CM | POA: Diagnosis not present

## 2015-06-21 HISTORY — PX: HARDWARE REMOVAL: SHX979

## 2015-06-21 LAB — CBC
HCT: 36.3 % (ref 36.0–46.0)
HEMOGLOBIN: 11.9 g/dL — AB (ref 12.0–15.0)
MCH: 26.4 pg (ref 26.0–34.0)
MCHC: 32.8 g/dL (ref 30.0–36.0)
MCV: 80.7 fL (ref 78.0–100.0)
Platelets: 395 10*3/uL (ref 150–400)
RBC: 4.5 MIL/uL (ref 3.87–5.11)
RDW: 14.6 % (ref 11.5–15.5)
WBC: 9.6 10*3/uL (ref 4.0–10.5)

## 2015-06-21 LAB — TYPE AND SCREEN
ABO/RH(D): O POS
Antibody Screen: NEGATIVE

## 2015-06-21 SURGERY — REMOVAL, HARDWARE
Anesthesia: General | Site: Back

## 2015-06-21 MED ORDER — OXYCODONE-ACETAMINOPHEN 5-325 MG PO TABS
ORAL_TABLET | ORAL | Status: AC
  Filled 2015-06-21: qty 2

## 2015-06-21 MED ORDER — PHENOL 1.4 % MT LIQD: 1.0000 | OROMUCOSAL | Status: DC | PRN

## 2015-06-21 MED ORDER — DEXAMETHASONE SODIUM PHOSPHATE 10 MG/ML IJ SOLN
INTRAMUSCULAR | Status: AC
Start: 2015-06-21 — End: 2015-06-22
  Filled 2015-06-21: qty 1

## 2015-06-21 MED ORDER — CYCLOBENZAPRINE HCL 10 MG PO TABS: 10.0000 mg | ORAL_TABLET | Freq: Three times a day (TID) | ORAL | Status: DC | PRN

## 2015-06-21 MED ORDER — HEMOSTATIC AGENTS (NO CHARGE) OPTIME
TOPICAL | Status: DC | PRN
  Administered 2015-06-21: 1 via TOPICAL

## 2015-06-21 MED ORDER — PROPOFOL 10 MG/ML IV BOLUS
INTRAVENOUS | Status: AC
  Filled 2015-06-21: qty 20

## 2015-06-21 MED ORDER — ROCURONIUM BROMIDE 50 MG/5ML IV SOLN
INTRAVENOUS | Status: AC
  Filled 2015-06-21: qty 1

## 2015-06-21 MED ORDER — SUCCINYLCHOLINE CHLORIDE 20 MG/ML IJ SOLN
INTRAMUSCULAR | Status: AC
  Filled 2015-06-21: qty 1

## 2015-06-21 MED ORDER — MIDAZOLAM HCL 2 MG/2ML IJ SOLN
INTRAMUSCULAR | Status: AC
  Filled 2015-06-21: qty 4

## 2015-06-21 MED ORDER — SODIUM CHLORIDE 0.9 % IJ SOLN
INTRAMUSCULAR | Status: AC
  Filled 2015-06-21: qty 20

## 2015-06-21 MED ORDER — LIDOCAINE-EPINEPHRINE 1 %-1:100000 IJ SOLN
INTRAMUSCULAR | Status: DC | PRN
  Administered 2015-06-21: 7 mL

## 2015-06-21 MED ORDER — ROCURONIUM BROMIDE 100 MG/10ML IV SOLN
INTRAVENOUS | Status: DC | PRN
  Administered 2015-06-21: 50 mg via INTRAVENOUS

## 2015-06-21 MED ORDER — SUFENTANIL CITRATE 50 MCG/ML IV SOLN
INTRAVENOUS | Status: AC
  Filled 2015-06-21: qty 1

## 2015-06-21 MED ORDER — SODIUM CHLORIDE 0.9 % IJ SOLN: 3.0000 mL | INTRAMUSCULAR | Status: DC | PRN

## 2015-06-21 MED ORDER — ACETAMINOPHEN 325 MG PO TABS: 650.0000 mg | ORAL_TABLET | ORAL | Status: DC | PRN

## 2015-06-21 MED ORDER — DEXAMETHASONE SODIUM PHOSPHATE 4 MG/ML IJ SOLN
INTRAMUSCULAR | Status: DC | PRN
  Administered 2015-06-21: 4 mg via INTRAVENOUS

## 2015-06-21 MED ORDER — CYCLOBENZAPRINE HCL 10 MG PO TABS
10.0000 mg | ORAL_TABLET | Freq: Three times a day (TID) | ORAL | Status: DC | PRN
  Administered 2015-06-21: 10 mg via ORAL

## 2015-06-21 MED ORDER — HYDROMORPHONE HCL 1 MG/ML IJ SOLN
0.5000 mg | INTRAMUSCULAR | Status: DC | PRN
  Administered 2015-06-21: 0.5 mg via INTRAVENOUS

## 2015-06-21 MED ORDER — FENTANYL CITRATE (PF) 100 MCG/2ML IJ SOLN
INTRAMUSCULAR | Status: AC
  Filled 2015-06-21: qty 2

## 2015-06-21 MED ORDER — ACETAMINOPHEN 650 MG RE SUPP: 650.0000 mg | RECTAL | Status: DC | PRN

## 2015-06-21 MED ORDER — SODIUM CHLORIDE 0.9 % IR SOLN
Status: DC | PRN
  Administered 2015-06-21: 18:00:00

## 2015-06-21 MED ORDER — CEFAZOLIN SODIUM-DEXTROSE 2-3 GM-% IV SOLR
2.0000 g | Freq: Three times a day (TID) | INTRAVENOUS | Status: DC
  Administered 2015-06-22: 2 g via INTRAVENOUS
  Filled 2015-06-21 (×5): qty 50

## 2015-06-21 MED ORDER — DIPHENHYDRAMINE HCL 50 MG/ML IJ SOLN
INTRAMUSCULAR | Status: DC | PRN
Start: 2015-06-21 — End: 2015-06-21
  Administered 2015-06-21: 10 mg via INTRAVENOUS

## 2015-06-21 MED ORDER — CEFAZOLIN SODIUM-DEXTROSE 2-3 GM-% IV SOLR
INTRAVENOUS | Status: DC | PRN
  Administered 2015-06-21: 2 g via INTRAVENOUS

## 2015-06-21 MED ORDER — PANTOPRAZOLE SODIUM 40 MG PO TBEC
80.0000 mg | DELAYED_RELEASE_TABLET | Freq: Two times a day (BID) | ORAL | Status: DC
Start: 2015-06-21 — End: 2015-06-22
  Administered 2015-06-21 – 2015-06-22 (×2): 80 mg via ORAL
  Filled 2015-06-21 (×2): qty 2

## 2015-06-21 MED ORDER — GLYCOPYRROLATE 0.2 MG/ML IJ SOLN
INTRAMUSCULAR | Status: AC
  Filled 2015-06-21: qty 1

## 2015-06-21 MED ORDER — DEXAMETHASONE SODIUM PHOSPHATE 4 MG/ML IJ SOLN
INTRAMUSCULAR | Status: AC
  Filled 2015-06-21: qty 1

## 2015-06-21 MED ORDER — THROMBIN 5000 UNITS EX SOLR
CUTANEOUS | Status: DC | PRN
  Administered 2015-06-21 (×2): 5000 [IU] via TOPICAL

## 2015-06-21 MED ORDER — CYCLOBENZAPRINE HCL 10 MG PO TABS
ORAL_TABLET | ORAL | Status: AC
Start: 2015-06-21 — End: 2015-06-22
  Filled 2015-06-21: qty 1

## 2015-06-21 MED ORDER — PHENYLEPHRINE HCL 10 MG/ML IJ SOLN
INTRAMUSCULAR | Status: DC | PRN
  Administered 2015-06-21 (×3): 80 ug via INTRAVENOUS

## 2015-06-21 MED ORDER — DEXAMETHASONE SODIUM PHOSPHATE 10 MG/ML IJ SOLN
INTRAMUSCULAR | Status: AC
  Filled 2015-06-21: qty 2

## 2015-06-21 MED ORDER — SODIUM CHLORIDE 0.9 % IJ SOLN
3.0000 mL | Freq: Two times a day (BID) | INTRAMUSCULAR | Status: DC
  Administered 2015-06-22: 3 mL via INTRAVENOUS

## 2015-06-21 MED ORDER — 0.9 % SODIUM CHLORIDE (POUR BTL) OPTIME
TOPICAL | Status: DC | PRN
  Administered 2015-06-21: 1000 mL

## 2015-06-21 MED ORDER — LIDOCAINE HCL (CARDIAC) 20 MG/ML IV SOLN
INTRAVENOUS | Status: DC | PRN
  Administered 2015-06-21: 50 mg via INTRAVENOUS

## 2015-06-21 MED ORDER — OXYCODONE-ACETAMINOPHEN 5-325 MG PO TABS
1.0000 | ORAL_TABLET | ORAL | Status: DC | PRN
  Administered 2015-06-21 – 2015-06-22 (×2): 2 via ORAL
  Administered 2015-06-22: 1 via ORAL
  Administered 2015-06-22 (×2): 2 via ORAL
  Filled 2015-06-21 (×4): qty 2

## 2015-06-21 MED ORDER — DIPHENHYDRAMINE HCL 50 MG/ML IJ SOLN
INTRAMUSCULAR | Status: AC
  Filled 2015-06-21: qty 1

## 2015-06-21 MED ORDER — LACTATED RINGERS IV SOLN
INTRAVENOUS | Status: DC
  Administered 2015-06-21 (×2): via INTRAVENOUS

## 2015-06-21 MED ORDER — SUGAMMADEX SODIUM 200 MG/2ML IV SOLN
INTRAVENOUS | Status: AC
  Filled 2015-06-21: qty 2

## 2015-06-21 MED ORDER — HYDROMORPHONE HCL 1 MG/ML IJ SOLN
INTRAMUSCULAR | Status: AC
  Filled 2015-06-21: qty 1

## 2015-06-21 MED ORDER — OXYCODONE-ACETAMINOPHEN 5-325 MG PO TABS: 1.0000 | ORAL_TABLET | ORAL | Status: DC | PRN

## 2015-06-21 MED ORDER — ONDANSETRON HCL 4 MG/2ML IJ SOLN: 4.0000 mg | INTRAMUSCULAR | Status: DC | PRN

## 2015-06-21 MED ORDER — FENTANYL CITRATE (PF) 100 MCG/2ML IJ SOLN
25.0000 ug | INTRAMUSCULAR | Status: DC | PRN
  Administered 2015-06-21 (×3): 50 ug via INTRAVENOUS

## 2015-06-21 MED ORDER — LIDOCAINE HCL (CARDIAC) 20 MG/ML IV SOLN
INTRAVENOUS | Status: AC
  Filled 2015-06-21: qty 10

## 2015-06-21 MED ORDER — PROMETHAZINE HCL 25 MG/ML IJ SOLN: 6.2500 mg | INTRAMUSCULAR | Status: DC | PRN

## 2015-06-21 MED ORDER — SUFENTANIL CITRATE 50 MCG/ML IV SOLN
INTRAVENOUS | Status: DC | PRN
  Administered 2015-06-21: 15 ug via INTRAVENOUS

## 2015-06-21 MED ORDER — SUGAMMADEX SODIUM 200 MG/2ML IV SOLN
INTRAVENOUS | Status: DC | PRN
  Administered 2015-06-21: 176 mg via INTRAVENOUS

## 2015-06-21 MED ORDER — DEXAMETHASONE SODIUM PHOSPHATE 10 MG/ML IJ SOLN
10.0000 mg | Freq: Four times a day (QID) | INTRAMUSCULAR | Status: DC
  Administered 2015-06-21 – 2015-06-22 (×3): 10 mg via INTRAVENOUS
  Filled 2015-06-21 (×2): qty 1

## 2015-06-21 MED ORDER — MIDAZOLAM HCL 5 MG/5ML IJ SOLN
INTRAMUSCULAR | Status: DC | PRN
  Administered 2015-06-21: 2 mg via INTRAVENOUS

## 2015-06-21 MED ORDER — DOCUSATE SODIUM 100 MG PO CAPS
100.0000 mg | ORAL_CAPSULE | Freq: Two times a day (BID) | ORAL | Status: DC
  Administered 2015-06-21 – 2015-06-22 (×2): 100 mg via ORAL
  Filled 2015-06-21 (×2): qty 1

## 2015-06-21 MED ORDER — PROPOFOL 10 MG/ML IV BOLUS
INTRAVENOUS | Status: DC | PRN
  Administered 2015-06-21: 30 mg via INTRAVENOUS
  Administered 2015-06-21: 90 mg via INTRAVENOUS

## 2015-06-21 MED ORDER — ONDANSETRON HCL 4 MG/2ML IJ SOLN
INTRAMUSCULAR | Status: DC | PRN
  Administered 2015-06-21: 4 mg via INTRAVENOUS

## 2015-06-21 MED ORDER — SODIUM CHLORIDE 0.9 % IV SOLN
250.0000 mL | INTRAVENOUS | Status: DC
  Administered 2015-06-21: 250 mL via INTRAVENOUS

## 2015-06-21 MED ORDER — MENTHOL 3 MG MT LOZG: 1.0000 | LOZENGE | OROMUCOSAL | Status: DC | PRN

## 2015-06-21 MED ORDER — BACITRACIN ZINC 500 UNIT/GM EX OINT
TOPICAL_OINTMENT | CUTANEOUS | Status: DC | PRN
  Administered 2015-06-21: 1 via TOPICAL

## 2015-06-21 SURGICAL SUPPLY — 54 items
BAG DECANTER FOR FLEXI CONT (MISCELLANEOUS) ×3 IMPLANT
BENZOIN TINCTURE PRP APPL 2/3 (GAUZE/BANDAGES/DRESSINGS) ×3 IMPLANT
BLADE CLIPPER SURG (BLADE) IMPLANT
BRUSH SCRUB EZ PLAIN DRY (MISCELLANEOUS) ×3 IMPLANT
BUR MATCHSTICK NEURO 3.0 LAGG (BURR) ×3 IMPLANT
BUR PRECISION FLUTE 6.0 (BURR) ×3 IMPLANT
CANISTER SUCT 3000ML PPV (MISCELLANEOUS) IMPLANT
CAP LOCKING (Cap) ×2 IMPLANT
CLOSURE WOUND 1/2 X4 (GAUZE/BANDAGES/DRESSINGS) ×1
DECANTER SPIKE VIAL GLASS SM (MISCELLANEOUS) ×3 IMPLANT
DRAPE LAPAROTOMY 100X72X124 (DRAPES) ×3 IMPLANT
DRAPE POUCH INSTRU U-SHP 10X18 (DRAPES) ×3 IMPLANT
DRAPE PROXIMA HALF (DRAPES) IMPLANT
DRAPE SURG 17X23 STRL (DRAPES) ×3 IMPLANT
DRSG OPSITE POSTOP 4X6 (GAUZE/BANDAGES/DRESSINGS) ×3 IMPLANT
ELECT REM PT RETURN 9FT ADLT (ELECTROSURGICAL) ×3
ELECTRODE REM PT RTRN 9FT ADLT (ELECTROSURGICAL) ×1 IMPLANT
EVACUATOR 1/8 PVC DRAIN (DRAIN) ×3 IMPLANT
GAUZE SPONGE 4X4 12PLY STRL (GAUZE/BANDAGES/DRESSINGS) IMPLANT
GAUZE SPONGE 4X4 16PLY XRAY LF (GAUZE/BANDAGES/DRESSINGS) IMPLANT
GLOVE BIO SURGEON STRL SZ 6.5 (GLOVE) ×4 IMPLANT
GLOVE BIO SURGEON STRL SZ8 (GLOVE) ×3 IMPLANT
GLOVE BIO SURGEONS STRL SZ 6.5 (GLOVE) ×2
GLOVE BIOGEL PI IND STRL 6.5 (GLOVE) ×2 IMPLANT
GLOVE BIOGEL PI IND STRL 7.5 (GLOVE) ×1 IMPLANT
GLOVE BIOGEL PI INDICATOR 6.5 (GLOVE) ×4
GLOVE BIOGEL PI INDICATOR 7.5 (GLOVE) ×2
GLOVE EXAM NITRILE LRG STRL (GLOVE) IMPLANT
GLOVE EXAM NITRILE MD LF STRL (GLOVE) IMPLANT
GLOVE EXAM NITRILE XL STR (GLOVE) IMPLANT
GLOVE EXAM NITRILE XS STR PU (GLOVE) IMPLANT
GLOVE INDICATOR 8.5 STRL (GLOVE) ×3 IMPLANT
GOWN STRL REUS W/ TWL LRG LVL3 (GOWN DISPOSABLE) ×1 IMPLANT
GOWN STRL REUS W/ TWL XL LVL3 (GOWN DISPOSABLE) ×1 IMPLANT
GOWN STRL REUS W/TWL 2XL LVL3 (GOWN DISPOSABLE) IMPLANT
GOWN STRL REUS W/TWL LRG LVL3 (GOWN DISPOSABLE) ×2
GOWN STRL REUS W/TWL XL LVL3 (GOWN DISPOSABLE) ×2
KIT BASIN OR (CUSTOM PROCEDURE TRAY) ×3 IMPLANT
KIT ROOM TURNOVER OR (KITS) ×3 IMPLANT
LIQUID BAND (GAUZE/BANDAGES/DRESSINGS) ×3 IMPLANT
LOCKING CAP (Cap) ×6 IMPLANT
NEEDLE HYPO 22GX1.5 SAFETY (NEEDLE) ×3 IMPLANT
NEEDLE SPNL 22GX3.5 QUINCKE BK (NEEDLE) ×3 IMPLANT
NS IRRIG 1000ML POUR BTL (IV SOLUTION) ×3 IMPLANT
PACK LAMINECTOMY NEURO (CUSTOM PROCEDURE TRAY) ×3 IMPLANT
SPONGE SURGIFOAM ABS GEL SZ50 (HEMOSTASIS) ×3 IMPLANT
STRIP CLOSURE SKIN 1/2X4 (GAUZE/BANDAGES/DRESSINGS) ×2 IMPLANT
SUT VIC AB 0 CT1 18XCR BRD8 (SUTURE) ×1 IMPLANT
SUT VIC AB 0 CT1 8-18 (SUTURE) ×2
SUT VIC AB 2-0 CT1 18 (SUTURE) ×3 IMPLANT
SUT VICRYL 4-0 PS2 18IN ABS (SUTURE) ×3 IMPLANT
TOWEL OR 17X24 6PK STRL BLUE (TOWEL DISPOSABLE) ×3 IMPLANT
TOWEL OR 17X26 10 PK STRL BLUE (TOWEL DISPOSABLE) ×3 IMPLANT
WATER STERILE IRR 1000ML POUR (IV SOLUTION) ×3 IMPLANT

## 2015-06-21 NOTE — Progress Notes (Signed)
Patient brought up by PACU nurse and tech. Husband at bedside reporting slight pain. Oriented to room and equipement will continue to monitor. Julie Obrien, [place] 06/21/2015 2132

## 2015-06-21 NOTE — Op Note (Signed)
Preoperative diagnosis: Right C8 and T1 radiculopathy from fractured T1 pedicle and displacement of right T1 pedicle screw  Postoperative diagnosis: Same  Procedure: Reexploration of posterior cervical decompression and fusion for removal of right T1 pedicle screw and redo right C8 and T1 foraminotomies  Surgeon: Dominica Severin Roselene Gray  Anesthesia: Gen.  EBL: Minimal  History of present illness: Patient is a pleasant 64 year old female who initially underwent a posterior cervical decompression fusion did very well however about a week and a half postop started utilizing her arms doing a little of shaking of a sheet felt a pop in her neck went to the emerge from was value was just having neck pain at that time plain films did not show any abnormality and patient was discharged with follow-up. Over the following week patient started experiencing tingling numbness down her right arm with weakness in her right hand and evaluation patient has some grip weakness CT scan showed a fracture or T1 spinous process and a fracture through T1 pedicle with displacement of right T1 screw towards the T1 foramen. Due to patient's clinical exam and imaging findings and progression of clinical syndrome I recommended reexploration of fusion removal of T1 screw and evaluation with redo foraminotomies. I extensively went over the risks and benefits of the operation with the patient as well as perioperative course expectations of outcome and alternatives of surgery and she understood and agreed to proceed forward.  Operative procedure: Patient brought into the or was induced under general anesthesia positioned prone in pins in back side of her  neck was prepped and draped in routine sterile fashion. Her old incision the inferior aspect was opened up the scar tissues dissected free the sutures were cut and the retractors placed exposing the hardware. I then disconnected the nuts on the right side remove the rod and remove the right T1  pedicle screw. I think cleaned with Gelfoam identified the medial border of the T1 pedicle I did medially identified fractured pieces of the medial T1 pedicle which are displaced against the T1 nerve root. I removed these pieces I extended the T1 foraminotomy to ensure wide decompression and then marched superiorly identify the C8 nerve root and extended the C8 foraminotomies. At the end decompression was no further stenosis wound scope was irrigated fusion appear to be solid and did not feel that she needed additional hardware put in. So I left Hemovac drain Gelfoam over over the dura and reapproximated with interrupted Vicryl's and a running 4 subcuticular in the skin. Dermabond benzo and Steri-Strips applied as well as a sterile dressing and a cervical collar patient recovered in stable condition. At the end of case all needle counts sponge counts were correct.

## 2015-06-21 NOTE — Progress Notes (Signed)
Patient ID: Julie Obrien, female   DOB: [birth year], 64 y.o.   MRN: [id number] Called to see patient because of numbness in her right arm that seemed slightly worse than it was preop. Patient had baseline numbness that went down the back of her arm down the ulnar distribution of her right forearm into her hand. She had grip weakness preoperatively is 4+ out of 5. Now postop she's complaining of new numbness on the inner aspect of her right arm that seems more consistent with a T1 distribution. Grip strength seems to be improved bicep and tricep are 5 out of 5. I think that overall her strength is improved she has a little more numbness in a T1 distribution we had performed foraminotomies of C8 and T1 so this may be a little bit of radiculitis and inflammation will treated with a little steroids overnight and continue to observe. She denies any numbness in her left side any difficulty with her legs any new numbness in the upper part of her arm.

## 2015-06-21 NOTE — Anesthesia Procedure Notes (Signed)
Procedure Name: Intubation Date/Time: 06/21/2015 5:33 PM Performed by: Melina Copa, Devri Kreher R Pre-anesthesia Checklist: Patient identified, Emergency Drugs available, Suction available, Patient being monitored and Timeout performed Patient Re-evaluated:Patient Re-evaluated prior to inductionOxygen Delivery Method: Circle system utilized Preoxygenation: Pre-oxygenation with 100% oxygen Intubation Type: IV induction Ventilation: Mask ventilation without difficulty Laryngoscope Size: Mac and 3 Grade View: Grade I Tube type: Oral Tube size: 7.5 mm Number of attempts: 1 Airway Equipment and Method: Stylet Placement Confirmation: positive ETCO2,  ETT inserted through vocal cords under direct vision and breath sounds checked- equal and bilateral Secured at: 21 cm Tube secured with: Tape Dental Injury: Teeth and Oropharynx as per pre-operative assessment

## 2015-06-21 NOTE — Anesthesia Postprocedure Evaluation (Signed)
  Anesthesia Post-op Note  Patient: Julie Obrien  Procedure(s) Performed: Procedure(s): Exploration of Fusion and removal of hardware right thoracic level one (N/A)  Patient Location: PACU  Anesthesia Type:General  Level of Consciousness: awake, alert  and oriented  Airway and Oxygen Therapy: Patient Spontanous Breathing and Patient connected to nasal cannula oxygen  Post-op Pain: mild  Post-op Assessment: Post-op Vital signs reviewed, Patient's Cardiovascular Status Stable, Respiratory Function Stable, Patent Airway and Pain level controlled LLE Motor Response: Purposeful movement LLE Sensation: Full sensation RLE Motor Response: Purposeful movement RLE Sensation: Full sensation      Post-op Vital Signs: stable  Last Vitals:  Filed Vitals:   06/21/15 2122  BP: 108/56  Pulse: 72  Temp: 36.4 C  Resp: 16    Complications: No apparent anesthesia complications

## 2015-06-21 NOTE — H&P (Signed)
Julie Obrien is an 64 y.o. female.   Chief Complaint: Neck pain right arm pain HPI: Patient is very pleasant 64 year old female who underwent posterior cervical decompression and fusion about 3 weeks ago in the first postoperative week she was reaching up in doing so with her arms felt a pop in her neck went to the emergency room underwent plain x-rays was complaining of neck pain and lately discharged from the ER with follow-up. I saw her in the Office she was having no radicular symptoms just neck pain x-rays were unremarkable although the x-rays were not adequately visualizing the T1 spinous process. About a week later started complaining of radicular symptoms or to CT scan noted a T1 spinous process fracture with displacement of the right T1 screw into the T1 foramen through the interest of the pedicle. It appears that she is not unstable at this segment so I have recommended removal of the T1 pedicle screw explore the T1 foramen and do not put any additional hardware in. I told her make that assessment intraoperatively. I've extensively gone over the risks and benefits of the procedure with the patient as well as perioperative course expectations of outcome and alternatives of surgery and she understands and agrees to proceed forward. 64 year old female presents for expiration of fusion removal of hardware on the right.  Past Medical History  Diagnosis Date  . Complication of anesthesia     nausea one surgery more recent  . PONV (postoperative nausea and vomiting)   . Arthritis   . History of kidney stones     Past Surgical History  Procedure Laterality Date  . Cesarean section      x4  . Tonsillectomy    . Cysto  99    stone  . Cervical discectomy  09  . Back surgery  08  . Cyst excision      vocal cords?  . Posterior cervical fusion/foraminotomy N/A 05/31/2015    Procedure: Decompression and Laminectomy Cervical three-four  Cervical six-seven  Cervical seven-thoracic-one Posterior  cervical fusion with lateral mass fixation;  Surgeon: Kary Kos, MD;  Location: [facility];  Service: Neurosurgery;  Laterality: N/A;    History reviewed. No pertinent family history. Social History:  reports that she has been smoking Cigarettes.  She has been smoking about 0.00 packs per day for the past 17 years. She does not have any smokeless tobacco history on file. She reports that she does not drink alcohol or use illicit drugs.  Allergies:  Allergies  Allergen Reactions  . Sulfa Antibiotics Hives    Medications Prior to Admission  Medication Sig Dispense Refill  . cyclobenzaprine (FLEXERIL) 10 MG tablet Take 1 tablet (10 mg total) by mouth 3 (three) times daily as needed for muscle spasms. 60 tablet 0  . oxyCODONE-acetaminophen (PERCOCET/ROXICET) 5-325 MG tablet Take 1-2 tablets by mouth every 4 (four) hours as needed for moderate pain. 60 tablet 0    Results for orders placed or performed during the hospital encounter of 06/21/15 (from the past 48 hour(s))  Type and screen [place]     Status: None   Collection Time: 06/21/15  2:50 PM  Result Value Ref Range   ABO/RH(D) O POS    Antibody Screen NEG    Sample Expiration 06/24/2015   CBC     Status: Abnormal   Collection Time: 06/21/15  2:57 PM  Result Value Ref Range   WBC 9.6 4.0 - 10.5 K/uL   RBC 4.50  3.87 - 5.11 MIL/uL   Hemoglobin 11.9 (L) 12.0 - 15.0 g/dL   HCT 36.3 36.0 - 46.0 %   MCV 80.7 78.0 - 100.0 fL   MCH 26.4 26.0 - 34.0 pg   MCHC 32.8 30.0 - 36.0 g/dL   RDW 14.6 11.5 - 15.5 %   Platelets 395 150 - 400 K/uL   No results found.  Review of Systems  Constitutional: Negative.   Eyes: Negative.   Respiratory: Negative.   Cardiovascular: Negative.   Gastrointestinal: Negative.   Genitourinary: Negative.   Musculoskeletal: Positive for myalgias and neck pain.  Skin: Negative.   Neurological: Positive for tingling and sensory change.  Psychiatric/Behavioral: Negative.     Blood  pressure 131/58, pulse 81, temperature 98.7 F (37.1 C), temperature source Oral, resp. rate 16, height 5\' 3"  (1.6 m), weight 87.998 kg (194 lb), SpO2 98 %. Physical Exam  Constitutional: She is oriented to person, place, and time. She appears well-developed and well-nourished.  HENT:  Head: Normocephalic.  Eyes: Pupils are equal, round, and reactive to light.  Neck: Normal range of motion.  Respiratory: Effort normal.  GI: Soft. Bowel sounds are normal.  Neurological: She is alert and oriented to person, place, and time. She has normal strength.  Strength is 5 of 5 in her deltoid, bicep, tricep, wrist flexion, wrist extension, hand intrinsics on the right are slightly weak at 4+ out of 5.  Skin: Skin is warm and dry.     Assessment/Plan 72 to female presents for exploration of fusion removal of T1 pedicle screw on the right.  Bernell Haynie P 06/21/2015, 4:38 PM

## 2015-06-21 NOTE — Anesthesia Preprocedure Evaluation (Addendum)
Anesthesia Evaluation  Patient identified by MRN, date of birth, ID band Patient awake    Reviewed: Allergy & Precautions, NPO status , Patient's Chart, lab work & pertinent test results  History of Anesthesia Complications (+) PONV and history of anesthetic complications  Airway Mallampati: II  TM Distance: >3 FB Neck ROM: Limited    Dental  (+) Partial Lower, Partial Upper, Dental Advisory Given   Pulmonary neg shortness of breath, neg sleep apnea, neg COPD, neg recent URI, Current Smoker,    Pulmonary exam normal breath sounds clear to auscultation       Cardiovascular negative cardio ROS Normal cardiovascular exam Rhythm:Regular     Neuro/Psych neg Seizures  Neuromuscular disease negative psych ROS   GI/Hepatic negative GI ROS, Neg liver ROS,   Endo/Other  Obesity   Renal/GU negative Renal ROS     Musculoskeletal  (+) Arthritis , Osteoarthritis,    Abdominal   Peds  Hematology negative hematology ROS (+)   Anesthesia Other Findings Day of surgery medications reviewed with the patient.  Reproductive/Obstetrics                           Anesthesia Physical Anesthesia Plan  ASA: II  Anesthesia Plan: General   Post-op Pain Management:    Induction: Intravenous  Airway Management Planned: Oral ETT and Video Laryngoscope Planned  Additional Equipment: None  Intra-op Plan:   Post-operative Plan: Extubation in OR  Informed Consent: I have reviewed the patients History and Physical, chart, labs and discussed the procedure including the risks, benefits and alternatives for the proposed anesthesia with the patient or authorized representative who has indicated his/her understanding and acceptance.   Dental advisory given  Plan Discussed with: CRNA and Surgeon  Anesthesia Plan Comments:        Anesthesia Quick Evaluation

## 2015-06-21 NOTE — Transfer of Care (Signed)
Immediate Anesthesia Transfer of Care Note  Patient: Julie Obrien  Procedure(s) Performed: Procedure(s): Exploration of Fusion and removal of hardware right thoracic level one (N/A)  Patient Location: PACU  Anesthesia Type:General  Level of Consciousness: awake, alert  and oriented  Airway & Oxygen Therapy: Patient Spontanous Breathing and Patient connected to nasal cannula oxygen  Post-op Assessment: Report given to RN and Post -op Vital signs reviewed and stable  Post vital signs: Reviewed and stable  Last Vitals:  Filed Vitals:   06/21/15 1434  BP: 131/58  Pulse: 81  Temp: 37.1 C  Resp: 16    Complications: No apparent anesthesia complications

## 2015-06-22 ENCOUNTER — Encounter (HOSPITAL_COMMUNITY): Payer: Self-pay | Admitting: Neurosurgery

## 2015-06-22 NOTE — Care Management Note (Signed)
Case Management Note  Patient Details  Name: MARIADEL MRUK MRN: [id number] Date of Birth: [birth year]  Subjective/Objective:                    Action/Plan: Patient admitted with thoracic spine fracture. Patient underwent exploration of fusion and removal of hardware T1. Patient lives at home with her spouse. CM will continue to follow for discharge needs.   Expected Discharge Date:                  Expected Discharge Plan:  Home/Self Care  In-House Referral:     Discharge planning Services     Post Acute Care Choice:    Choice offered to:     DME Arranged:    DME Agency:     HH Arranged:    [place] Agency:     Status of Service:  Completed, signed off  Medicare Important Message Given:    Date Medicare IM Given:    Medicare IM give by:    Date Additional Medicare IM Given:    Additional Medicare Important Message give by:     If discussed at [place] of Stay Meetings, dates discussed:    Additional Comments:  Pollie Friar, RN 06/22/2015, 8:48 AM

## 2015-06-22 NOTE — Progress Notes (Signed)
Discharge orders received.  Discharge instructions and follow-up appointments reviewed with the patient.  VSS upon discharge.  IV removed and education complete.  Dressing instructions discussed with the patient and her husband. Transported out via wheelchair. Cori Razor, RN

## 2015-06-22 NOTE — Discharge Instructions (Signed)
No lifting no bending no twisting no driving may remove the outer dressing in 3 or 4 days leave the Steri-Strips on and intact. Cover the Steri-Strips with saran wrap for showers only.

## 2015-06-22 NOTE — Discharge Summary (Signed)
  Physician Discharge Summary  Patient ID: CARLEEN RHUE MRN: [id number] DOB/AGE: [birth year] 64 y.o.  Admit date: 06/21/2015 Discharge date: 06/22/2015  Admission Diagnoses: Thoracic level I fracture and placed T1 pedicle screw with right C8 and T1 radiculopathies  Discharge Diagnoses: Same Active Problems:   Thoracic spine fracture, closed, initial encounter   Discharged Condition: good  Hospital Course: Patient admitted hospital underwent posterior cervical reexploration of fusion removal of hardware removal of right T1 pedicle screw redo foraminotomies of the C8 and T1 nerve roots on the right. Postop patient did very well recovered in the floor on the morning of postop day 1 patient significant proven right upper extremity numbness and pain tingling and strength. Patient stable for discharge home scheduled follow-up in one to 2 weeks.  Consults: Significant Diagnostic Studies: Treatments: Reexploration of posterior cervical fusion removal of hardware and redo foraminotomies Discharge Exam: Blood pressure 111/57, pulse 76, temperature 98.1 F (36.7 C), temperature source Oral, resp. rate 20, height 5\' 4"  (1.626 m), weight 90.311 kg (199 lb 1.6 oz), SpO2 96 %. Strength out of 5 wound clean dry and intact  Disposition: Home     Medication List    TAKE these medications        cyclobenzaprine 10 MG tablet  Commonly known as:  FLEXERIL  Take 1 tablet (10 mg total) by mouth 3 (three) times daily as needed for muscle spasms.     oxyCODONE-acetaminophen 5-325 MG tablet  Commonly known as:  PERCOCET/ROXICET  Take 1-2 tablets by mouth every 4 (four) hours as needed for moderate pain.           Follow-up Information    Follow up with [facility], MD.   Specialty:  Neurosurgery   Contact information:   [address] [place] [address] [phone]       Signed: Elaina Hoops 06/22/2015, 7:36 AM

## 2015-06-22 NOTE — Progress Notes (Signed)
Patient ID: Julie Obrien, female   DOB: Sep 16, 1950, 64 y.o.   MRN: [id number] Doing well discharged home significant proven right upper extremity numbness tingling pain strength.

## 2015-06-29 DIAGNOSIS — M542 Cervicalgia: Secondary | ICD-10-CM | POA: Diagnosis not present

## 2015-06-29 DIAGNOSIS — M5023 Other cervical disc displacement, cervicothoracic region: Secondary | ICD-10-CM | POA: Diagnosis not present

## 2015-07-03 ENCOUNTER — Encounter (HOSPITAL_COMMUNITY): Payer: Self-pay | Admitting: *Deleted

## 2015-07-03 ENCOUNTER — Emergency Department (HOSPITAL_COMMUNITY)
Admission: EM | Admit: 2015-07-03 | Discharge: 2015-07-03 | Disposition: A | Discharge status: Home / Self Care | Payer: Medicare Other | Attending: Emergency Medicine | Admitting: Emergency Medicine

## 2015-07-03 DIAGNOSIS — Z981 Arthrodesis status: Secondary | ICD-10-CM | POA: Insufficient documentation

## 2015-07-03 DIAGNOSIS — M1990 Unspecified osteoarthritis, unspecified site: Secondary | ICD-10-CM | POA: Insufficient documentation

## 2015-07-03 DIAGNOSIS — Z87891 Personal history of nicotine dependence: Secondary | ICD-10-CM | POA: Diagnosis not present

## 2015-07-03 DIAGNOSIS — H53141 Visual discomfort, right eye: Secondary | ICD-10-CM | POA: Diagnosis not present

## 2015-07-03 DIAGNOSIS — H5789 Other specified disorders of eye and adnexa: Secondary | ICD-10-CM

## 2015-07-03 DIAGNOSIS — Z87442 Personal history of urinary calculi: Secondary | ICD-10-CM | POA: Diagnosis not present

## 2015-07-03 DIAGNOSIS — H578 Other specified disorders of eye and adnexa: Secondary | ICD-10-CM | POA: Insufficient documentation

## 2015-07-03 DIAGNOSIS — H5711 Ocular pain, right eye: Secondary | ICD-10-CM | POA: Diagnosis present

## 2015-07-03 MED ORDER — FLUORESCEIN SODIUM 1 MG OP STRP
1.0000 | ORAL_STRIP | Freq: Once | OPHTHALMIC | Status: AC
  Administered 2015-07-03: 1 via OPHTHALMIC
  Filled 2015-07-03: qty 1

## 2015-07-03 MED ORDER — OLOPATADINE HCL 0.2 % OP SOLN: 1.0000 [drp] | Freq: Every day | OPHTHALMIC | Status: DC

## 2015-07-03 MED ORDER — TETRACAINE HCL 0.5 % OP SOLN
2.0000 [drp] | Freq: Once | OPHTHALMIC | Status: AC
  Administered 2015-07-03: 2 [drp] via OPHTHALMIC
  Filled 2015-07-03: qty 2

## 2015-07-03 NOTE — Discharge Instructions (Signed)
Please follow with your ophthalmologist next week. Do not hesitate to return to the emergency room for any new, worsening or concerning symptoms. Try to keep your hands away from the eyes

## 2015-07-03 NOTE — ED Provider Notes (Signed)
CSN: [id number]     Arrival date & time 07/03/15  1018 History  By signing my name below, I, Rayna Sexton, attest that this documentation has been prepared under the direction and in the presence of [employer], PA-C. Electronically Signed: Rayna Sexton, ED Scribe. 07/03/2015. 11:15 AM.   The history is provided by the patient. No language interpreter was used.   HPI HPI Comments: Julie Obrien is a 64 y.o. female who presents to the Emergency Department complaining of moderate bilateral eye irritation with onset 3 days ago. She notes initially feeling eye irritation with associated photophobia which she thought was seasonal allergies further noting that her right eye has worsened with pain radiating throughout her right head. Pt notes taking 10 mg zyrtec and percocet as needed which has provided short term relief. Pt denies any sick contacts. She confirms wearing rx eyewear (no contacts) and seeing an eye doctor regularly. She presents in a c-collar to due a cervical fusion which was performed last month. She denies eye discharge and visual disturbance.   Past Medical History  Diagnosis Date  . Complication of anesthesia     nausea one surgery more recent  . PONV (postoperative nausea and vomiting)   . Arthritis   . History of kidney stones    Past Surgical History  Procedure Laterality Date  . Cesarean section      x4  . Tonsillectomy    . Cysto  99    stone  . Cervical discectomy  09  . Back surgery  08  . Cyst excision      vocal cords?  . Posterior cervical fusion/foraminotomy N/A 05/31/2015    Procedure: Decompression and Laminectomy Cervical three-four  Cervical six-seven  Cervical seven-thoracic-one Posterior cervical fusion with lateral mass fixation;  Surgeon: Kary Kos, MD;  Location: [facility];  Service: Neurosurgery;  Laterality: N/A;  . Hardware removal N/A 06/21/2015    Procedure: Exploration of Fusion and removal of hardware right thoracic level one;   Surgeon: Kary Kos, MD;  Location: [facility];  Service: Neurosurgery;  Laterality: N/A;   No family history on file. Social History  Substance Use Topics  . Smoking status: Former Smoker -- 0.00 packs/day for 10 years    Types: Cigarettes    Quit date: 05/30/2015  . Smokeless tobacco: Not on file  . Alcohol Use: No   OB History    No data available     Review of Systems  All other systems reviewed and are negative.  Allergies  Sulfa antibiotics  Home Medications   Prior to Admission medications   Medication Sig Start Date End Date Taking? Authorizing Provider  cyclobenzaprine (FLEXERIL) 10 MG tablet Take 1 tablet (10 mg total) by mouth 3 (three) times daily as needed for muscle spasms. 06/02/15   Kary Kos, MD  oxyCODONE-acetaminophen (PERCOCET/ROXICET) 5-325 MG tablet Take 1-2 tablets by mouth every 4 (four) hours as needed for moderate pain. 06/02/15   Kary Kos, MD   There were no vitals taken for this visit. Physical Exam  Constitutional: She is oriented to person, place, and time. She appears well-developed and well-nourished.  HENT:  Head: Normocephalic and atraumatic.  Mouth/Throat: No oropharyngeal exudate.  No eyelid swelling bilaterally; lateral eyes injected with right worse than left; no discharge; pressure in right eye is 16 mmHG with a 95% confidence interval  Eyes: EOM are normal. Pupils are equal, round, and reactive to light.  Neck: No tracheal deviation present.  Aspen  collar in place (recent c-spine fusion)  Cardiovascular: Normal rate.   Pulmonary/Chest: Effort normal. No respiratory distress.  Abdominal: Soft. There is no tenderness.  Musculoskeletal: Normal range of motion.  Neurological: She is alert and oriented to person, place, and time.  Skin: Skin is warm and dry. She is not diaphoretic.  Psychiatric: She has a normal mood and affect. Her behavior is normal.  Nursing note and vitals reviewed.  ED Course  Procedures  DIAGNOSTIC  STUDIES: Oxygen Saturation is 97% on RA, normal by my interpretation.    COORDINATION OF CARE: 10:37 AM Pt presents today due to worsening bilateral eye irritation. Discussed treatment plan with pt at bedside including a slit lamp exam and next steps based on results. Pt agreed to plan.  SLIT LAMP EXAM: Tetracaine 2 drops used.  Lids everted and swept for exam, no evidence of foreign body.  Conjunctivae: bilateral eye injection worse on right than left Cornea: No evidence of abrasion. Normal anterior chamber with no cells or flair.  EOM: Intact  Pupils: PERRL  Fluorescein uptake: no abnormal uptake Patient tolerated procedure well without immediate complications.  Labs Review Labs Reviewed - No data to display  Imaging Review No results found.   EKG Interpretation None      MDM   Final diagnoses:  Irritation of right eye    Filed Vitals:   07/03/15 1024  BP: 130/67  Pulse: 82  Temp: 98.7 F (37.1 C)  TempSrc: Oral  Resp: 20  Weight: 194 lb 7 oz (88.196 kg)  SpO2: 97%    Medications  tetracaine (PONTOCAINE) 0.5 % ophthalmic solution 2 drop (2 drops Right Eye Given 07/03/15 1056)  fluorescein ophthalmic strip 1 strip (1 strip Right Eye Given 07/03/15 1057)    Julie Obrien is 64 y.o. female presenting with bilateral eye irratation worse on right.  FB sensation. PERRL, mild injection. No corneal abrasion, normal anterior chamber, normal intraocular pressure. Patient got some relief from by mouth allergy medications. I think this is more likely allergic rather than infection. She has no discharge. Patient will be started on Pataday and she will follow with her ophthalmologist next week. We've had an extensive discussion of return precautions and patient verbalizes her understanding.  Evaluation does not show pathology that would require ongoing emergent intervention or inpatient treatment. Pt is hemodynamically stable and mentating appropriately. Discussed findings  and plan with patient/guardian, who agrees with care plan. All questions answered. Return precautions discussed and outpatient follow up given.   Discharge Medication List as of 07/03/2015 11:28 AM    START taking these medications   Details  Olopatadine HCl (PATADAY) 0.2 % SOLN Apply 1 drop to eye daily., Starting 07/03/2015, Until Discontinued, Print         I personally performed the services described in this documentation, which was scribed in my presence. The recorded information has been reviewed and is accurate.    Monico Blitz, PA-C 07/03/15 1237  Fredia Sorrow, MD 07/04/15 1001

## 2015-07-03 NOTE — ED Notes (Signed)
Pt reports both eye have had Pain since WED. Pt reports increased pain and light sensitivity and HA.

## 2015-07-20 DIAGNOSIS — M5023 Other cervical disc displacement, cervicothoracic region: Secondary | ICD-10-CM | POA: Diagnosis not present

## 2015-07-20 DIAGNOSIS — R030 Elevated blood-pressure reading, without diagnosis of hypertension: Secondary | ICD-10-CM | POA: Diagnosis not present

## 2015-07-20 DIAGNOSIS — M5412 Radiculopathy, cervical region: Secondary | ICD-10-CM | POA: Diagnosis not present

## 2015-07-20 DIAGNOSIS — J029 Acute pharyngitis, unspecified: Secondary | ICD-10-CM | POA: Diagnosis not present

## 2015-07-20 DIAGNOSIS — H6090 Unspecified otitis externa, unspecified ear: Secondary | ICD-10-CM | POA: Diagnosis not present

## 2015-07-20 DIAGNOSIS — R490 Dysphonia: Secondary | ICD-10-CM | POA: Diagnosis not present

## 2015-07-26 DIAGNOSIS — Z1231 Encounter for screening mammogram for malignant neoplasm of breast: Secondary | ICD-10-CM | POA: Diagnosis not present

## 2015-08-26 DIAGNOSIS — R7309 Other abnormal glucose: Secondary | ICD-10-CM | POA: Diagnosis not present

## 2015-08-26 DIAGNOSIS — E785 Hyperlipidemia, unspecified: Secondary | ICD-10-CM | POA: Diagnosis not present

## 2015-08-26 DIAGNOSIS — Z0000 Encounter for general adult medical examination without abnormal findings: Secondary | ICD-10-CM | POA: Diagnosis not present

## 2015-08-26 DIAGNOSIS — Z6835 Body mass index (BMI) 35.0-35.9, adult: Secondary | ICD-10-CM | POA: Diagnosis not present

## 2015-08-26 DIAGNOSIS — E559 Vitamin D deficiency, unspecified: Secondary | ICD-10-CM | POA: Diagnosis not present

## 2015-08-26 DIAGNOSIS — H6122 Impacted cerumen, left ear: Secondary | ICD-10-CM | POA: Diagnosis not present

## 2015-09-02 ENCOUNTER — Other Ambulatory Visit (HOSPITAL_COMMUNITY): Payer: Self-pay | Admitting: Neurosurgery

## 2015-09-02 DIAGNOSIS — M5416 Radiculopathy, lumbar region: Secondary | ICD-10-CM | POA: Diagnosis not present

## 2015-09-02 DIAGNOSIS — M5137 Other intervertebral disc degeneration, lumbosacral region: Secondary | ICD-10-CM | POA: Diagnosis not present

## 2015-09-02 DIAGNOSIS — M5023 Other cervical disc displacement, cervicothoracic region: Secondary | ICD-10-CM | POA: Diagnosis not present

## 2015-09-02 DIAGNOSIS — Z6832 Body mass index (BMI) 32.0-32.9, adult: Secondary | ICD-10-CM | POA: Diagnosis not present

## 2015-09-27 ENCOUNTER — Inpatient Hospital Stay: Admission: RE | Admit: 2015-09-27 | Payer: Medicare Other | Source: Ambulatory Visit | Admitting: Neurosurgery

## 2015-09-27 ENCOUNTER — Encounter: Admission: RE | Payer: Self-pay | Source: Ambulatory Visit

## 2015-09-27 SURGERY — POSTERIOR LUMBAR FUSION 2 WITH HARDWARE REMOVAL
Anesthesia: General | Site: Back

## 2015-10-13 ENCOUNTER — Encounter (HOSPITAL_COMMUNITY): Payer: Self-pay | Admitting: *Deleted

## 2015-10-13 ENCOUNTER — Emergency Department (HOSPITAL_COMMUNITY): Payer: Medicare Other

## 2015-10-13 ENCOUNTER — Emergency Department (HOSPITAL_COMMUNITY)
Admission: EM | Admit: 2015-10-13 | Discharge: 2015-10-13 | Disposition: A | Discharge status: Home / Self Care | Payer: Medicare Other | Attending: Emergency Medicine | Admitting: Emergency Medicine

## 2015-10-13 DIAGNOSIS — J189 Pneumonia, unspecified organism: Secondary | ICD-10-CM

## 2015-10-13 DIAGNOSIS — Z87442 Personal history of urinary calculi: Secondary | ICD-10-CM | POA: Insufficient documentation

## 2015-10-13 DIAGNOSIS — Z87891 Personal history of nicotine dependence: Secondary | ICD-10-CM | POA: Diagnosis not present

## 2015-10-13 DIAGNOSIS — M1990 Unspecified osteoarthritis, unspecified site: Secondary | ICD-10-CM | POA: Diagnosis not present

## 2015-10-13 DIAGNOSIS — R509 Fever, unspecified: Secondary | ICD-10-CM | POA: Diagnosis present

## 2015-10-13 DIAGNOSIS — J159 Unspecified bacterial pneumonia: Secondary | ICD-10-CM | POA: Insufficient documentation

## 2015-10-13 DIAGNOSIS — R05 Cough: Secondary | ICD-10-CM | POA: Diagnosis not present

## 2015-10-13 DIAGNOSIS — R0602 Shortness of breath: Secondary | ICD-10-CM | POA: Diagnosis not present

## 2015-10-13 LAB — I-STAT CG4 LACTIC ACID, ED
LACTIC ACID, VENOUS: 1.29 mmol/L (ref 0.5–2.0)
Lactic Acid, Venous: 1.22 mmol/L (ref 0.5–2.0)

## 2015-10-13 LAB — COMPREHENSIVE METABOLIC PANEL
ALK PHOS: 79 U/L (ref 38–126)
ALT: 10 U/L — ABNORMAL LOW (ref 14–54)
AST: 17 U/L (ref 15–41)
Albumin: 3.9 g/dL (ref 3.5–5.0)
Anion gap: 10 (ref 5–15)
BILIRUBIN TOTAL: 0.5 mg/dL (ref 0.3–1.2)
BUN: 5 mg/dL — AB (ref 6–20)
CO2: 25 mmol/L (ref 22–32)
Calcium: 9.3 mg/dL (ref 8.9–10.3)
Chloride: 107 mmol/L (ref 101–111)
Creatinine, Ser: 0.79 mg/dL (ref 0.44–1.00)
GFR calc Af Amer: >60 mL/min (ref >60)
Glucose, Bld: 112 mg/dL — ABNORMAL HIGH (ref 65–99)
POTASSIUM: 4.8 mmol/L (ref 3.5–5.1)
Sodium: 142 mmol/L (ref 135–145)
TOTAL PROTEIN: 6.5 g/dL (ref 6.5–8.1)

## 2015-10-13 LAB — URINALYSIS, ROUTINE W REFLEX MICROSCOPIC
Bilirubin Urine: NEGATIVE
Glucose, UA: NEGATIVE mg/dL
Hgb urine dipstick: NEGATIVE
KETONES UR: NEGATIVE mg/dL
Leukocytes, UA: NEGATIVE
NITRITE: NEGATIVE
PH: 6 (ref 5.0–8.0)
Protein, ur: NEGATIVE mg/dL
SPECIFIC GRAVITY, URINE: 1.004 — AB (ref 1.005–1.030)

## 2015-10-13 MED ORDER — IPRATROPIUM-ALBUTEROL 0.5-2.5 (3) MG/3ML IN SOLN
3.0000 mL | Freq: Once | RESPIRATORY_TRACT | Status: AC
  Administered 2015-10-13: 3 mL via RESPIRATORY_TRACT
  Filled 2015-10-13: qty 3

## 2015-10-13 MED ORDER — ACETAMINOPHEN 325 MG PO TABS
650.0000 mg | ORAL_TABLET | Freq: Once | ORAL | Status: AC | PRN
  Administered 2015-10-13: 650 mg via ORAL

## 2015-10-13 MED ORDER — IBUPROFEN 600 MG PO TABS: 600.0000 mg | ORAL_TABLET | Freq: Four times a day (QID) | ORAL | Status: DC | PRN

## 2015-10-13 MED ORDER — DEXTROSE 5 % IV SOLN
1.0000 g | Freq: Once | INTRAVENOUS | Status: AC
Start: 2015-10-13 — End: 2015-10-13
  Administered 2015-10-13: 1 g via INTRAVENOUS
  Filled 2015-10-13: qty 10

## 2015-10-13 MED ORDER — AZITHROMYCIN 250 MG PO TABS: ORAL_TABLET | ORAL | Status: DC

## 2015-10-13 MED ORDER — BENZONATATE 100 MG PO CAPS: 200.0000 mg | ORAL_CAPSULE | Freq: Two times a day (BID) | ORAL | Status: DC | PRN

## 2015-10-13 MED ORDER — ACETAMINOPHEN 325 MG PO TABS
ORAL_TABLET | ORAL | Status: AC
  Filled 2015-10-13: qty 2

## 2015-10-13 MED ORDER — IBUPROFEN 800 MG PO TABS
800.0000 mg | ORAL_TABLET | Freq: Once | ORAL | Status: AC
  Administered 2015-10-13: 800 mg via ORAL
  Filled 2015-10-13: qty 1

## 2015-10-13 MED ORDER — SODIUM CHLORIDE 0.9 % IV BOLUS (SEPSIS)
1000.0000 mL | Freq: Once | INTRAVENOUS | Status: AC
  Administered 2015-10-13: 1000 mL via INTRAVENOUS

## 2015-10-13 MED ORDER — AZITHROMYCIN 250 MG PO TABS
500.0000 mg | ORAL_TABLET | Freq: Once | ORAL | Status: AC
  Administered 2015-10-13: 500 mg via ORAL
  Filled 2015-10-13: qty 2

## 2015-10-13 MED ORDER — IBUPROFEN 800 MG PO TABS: 800.0000 mg | ORAL_TABLET | Freq: Once | ORAL | Status: DC

## 2015-10-13 NOTE — Discharge Instructions (Signed)

## 2015-10-13 NOTE — ED Notes (Signed)
Pt reports headache, productive cough with clear sputum, sore throat, fever and bodyaches since yesterday. Mask on pt at triage.

## 2015-10-13 NOTE — ED Provider Notes (Signed)
CSN: [id number]     Arrival date & time 10/13/15  1256 History   First MD Initiated Contact with Patient 10/13/15 1621     Chief Complaint  Patient presents with  . Fever  . Cough     (Consider location/radiation/quality/duration/timing/severity/associated sxs/prior Treatment) HPI   Julie Obrien is a(n) 65 y.o. female who presents to the emergency department with chief complaint of flulike symptoms and cough. Onset yesterday. Patient states that she developed a cough, body aches, chills, myalgias, headache, fatigue, decreased appetite. She has cough which is painful, she is not coughing anything up. She is a current daily smoker. She has not been hospitalized or in healthcare facility with the last 90 days. She denies history of asthma.  Past Medical History  Diagnosis Date  . Complication of anesthesia     nausea one surgery more recent  . PONV (postoperative nausea and vomiting)   . Arthritis   . History of kidney stones    Past Surgical History  Procedure Laterality Date  . Cesarean section      x4  . Tonsillectomy    . Cysto  99    stone  . Cervical discectomy  09  . Back surgery  08  . Cyst excision      vocal cords?  . Posterior cervical fusion/foraminotomy N/A 05/31/2015    Procedure: Decompression and Laminectomy Cervical three-four  Cervical six-seven  Cervical seven-thoracic-one Posterior cervical fusion with lateral mass fixation;  Surgeon: Kary Kos, MD;  Location: [facility];  Service: Neurosurgery;  Laterality: N/A;  . Hardware removal N/A 06/21/2015    Procedure: Exploration of Fusion and removal of hardware right thoracic level one;  Surgeon: Kary Kos, MD;  Location: [facility];  Service: Neurosurgery;  Laterality: N/A;   History reviewed. No pertinent family history. Social History  Substance Use Topics  . Smoking status: Former Smoker -- 0.00 packs/day for 10 years    Types: Cigarettes    Quit date: 05/30/2015  . Smokeless tobacco: None  .  Alcohol Use: No   OB History    No data available     Review of Systems   Ten systems reviewed and are negative for acute change, except as noted in the HPI.   Allergies  Sulfa antibiotics  Home Medications   Prior to Admission medications   Medication Sig Start Date End Date Taking? Authorizing Provider  cyclobenzaprine (FLEXERIL) 10 MG tablet Take 1 tablet (10 mg total) by mouth 3 (three) times daily as needed for muscle spasms. Patient not taking: Reported on 10/12/2015 06/02/15   Kary Kos, MD  HYDROcodone-acetaminophen (NORCO/VICODIN) 5-325 MG tablet Take 1 tablet by mouth every 4 (four) hours as needed for moderate pain.  09/21/15   Historical Provider, MD  Olopatadine HCl (PATADAY) 0.2 % SOLN Apply 1 drop to eye daily. Patient not taking: Reported on 10/12/2015 07/03/15   Elmyra Ricks Pisciotta, PA-C  oxyCODONE-acetaminophen (PERCOCET/ROXICET) 5-325 MG tablet Take 1-2 tablets by mouth every 4 (four) hours as needed for moderate pain. Patient not taking: Reported on 10/12/2015 06/02/15   Kary Kos, MD   BP 102/71 mmHg  Pulse 85  Temp(Src) 101.5 F (38.6 C) (Oral)  Resp 16  SpO2 98% Physical Exam Physical Exam  Nursing note and vitals reviewed. Constitutional: She is oriented to person, place, and time. She appears well-developed and well-nourished. No distress.  HENT:  Head: Normocephalic and atraumatic.  Eyes: Conjunctivae normal and EOM are normal. Pupils are equal, round, and reactive  to light. No scleral icterus.  Neck: Normal range of motion.  Cardiovascular: Normal rate, regular rhythm and normal heart sounds.  Exam reveals no gallop and no friction rub.   No murmur heard. Pulmonary/Chest: Shallow inspiratory effort. Frequent bouts of coughing. No ronchi or wheezes. Abdominal: Soft. Bowel sounds are normal. She exhibits no distension and no mass. There is no tenderness. There is no guarding.  Neurological: She is alert and oriented to person, place, and time.  Skin: Skin  is warm and dry. She is not diaphoretic.    ED Course  Procedures (including critical care time) Labs Review Labs Reviewed  COMPREHENSIVE METABOLIC PANEL - Abnormal; Notable for the following:    Glucose, Bld 112 (*)    BUN 5 (*)    ALT 10 (*)    All other components within normal limits  I-STAT CG4 LACTIC ACID, ED  I-STAT CG4 LACTIC ACID, ED    Imaging Review Dg Chest 2 View  10/13/2015  CLINICAL DATA:  Productive cough for 2 days with shortness of breath EXAM: CHEST  2 VIEW COMPARISON:  July 22, 2010 FINDINGS: There is scarring in the lower lung zones. There is an area of focal consolidation in the superior lingula. Elsewhere lungs are clear. Heart size and pulmonary vascularity are normal. No adenopathy. There is degenerative change in the thoracic spine. There is postoperative change in the cervical region. IMPRESSION: Focal area of apparent pneumonia superior lingula. Areas of scarring in both lower lobes. Lungs elsewhere clear. Cardiac silhouette within normal limits. Followup PA and lateral chest radiographs recommended in 3-4 weeks following trial of antibiotic therapy to ensure resolution and exclude underlying malignancy. Electronically Signed   By: Lowella Grip III M.D.   On: 10/13/2015 13:36   I have personally reviewed and evaluated these images and lab results as part of my medical decision-making.   EKG Interpretation None      MDM   Final diagnoses:  CAP (community acquired pneumonia)    7:35 PM BP 144/95 mmHg  Pulse 86  Temp(Src) 100.7 F (38.2 C) (Oral)  Resp 19  SpO2 95% Patient has been diagnosed with CAP via chest xray. Pt is not ill appearing, immunocompromised, and does not have multiple co morbidities, therefore I feel like the they can be treated as an OP with abx therapy.  Her fever is decreased with antipyretics and she is not hypoxic at rest or with ambulation. Pt has been advised to return to the ED if symptoms worsen or they do not improve.  Pt verbalizes understanding and is agreeable with plan.      Margarita Mail, PA-C 10/13/15 1938  Charlesetta Shanks, MD 10/13/15 2049

## 2015-10-18 DIAGNOSIS — Z6834 Body mass index (BMI) 34.0-34.9, adult: Secondary | ICD-10-CM | POA: Diagnosis not present

## 2015-10-18 DIAGNOSIS — Z09 Encounter for follow-up examination after completed treatment for conditions other than malignant neoplasm: Secondary | ICD-10-CM | POA: Diagnosis not present

## 2015-10-18 DIAGNOSIS — J189 Pneumonia, unspecified organism: Secondary | ICD-10-CM | POA: Diagnosis not present

## 2015-10-18 DIAGNOSIS — R630 Anorexia: Secondary | ICD-10-CM | POA: Diagnosis not present

## 2015-10-20 ENCOUNTER — Inpatient Hospital Stay (HOSPITAL_COMMUNITY)
Admission: RE | Admit: 2015-10-20 | Discharge: 2015-10-20 | Disposition: A | Discharge status: Home / Self Care | Payer: Medicare Other | Source: Ambulatory Visit

## 2015-10-20 NOTE — Progress Notes (Signed)
Anesthesia Note: Patient is a 65 year old female scheduled for PLIF L3-4, L2-3, removal of hardware L4-S1 on 11/03/15 by Dr. Saintclair Halsted. Surgery was initially scheduled for 10/25/15, but was delayed to allow more time for recovery from community acquired PNA (diagnosed 10/13/15 and treated with azithromycin).  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 BMI is consistent with obesity. PCP is listed as Dr. Glendale Chard.  10/13/15 CXR (done in ED): IMPRESSION: Focal area of apparent pneumonia superior lingula. Areas of scarring in both lower lobes. Lungs elsewhere clear. Cardiac silhouette within normal limits. Followup PA and lateral chest radiographs recommended in 3-4 weeks following trial of antibiotic therapy to ensure resolution and exclude underlying malignancy.  Patient was scheduled for PAT this afternoon, but canceled her appointment. Our scheduler to contact her to reschedule. Preoperative labs PENDING her PAT visit.  [facility] Short Stay Center/Anesthesiology Phone [phone] 10/20/2015 2:16 PM

## 2015-10-20 NOTE — Pre-Procedure Instructions (Signed)
Julie Obrien  10/20/2015      [id number] - [name], [place] - [address] [place] [address] Phone: [phone] Fax: [phone]    Your procedure is scheduled on 11/03/15.  Report to [facility] Admitting at [phone] A.M.  Call this number if you have problems the morning of surgery:  [phone]   Remember:  Do not eat food or drink liquids after midnight.  Take these medicines the morning of surgery with A SIP OF WATER zithromycin if still taking,oxycodone if needed for pain  STOP all herbel meds, nsaids (aleve,naproxen,advil,ibuprofen) 5 days prior to surgery starting 10/29/15 including aspirin, vitamins, phenyleprine   Do not wear jewelry, make-up or nail polish.  Do not wear lotions, powders, or perfumes.  You may wear deodorant.  Do not shave 48 hours prior to surgery.  Men may shave face and neck.  Do not bring valuables to the hospital.  [facility] is not responsible for any belongings or valuables.  Contacts, dentures or bridgework may not be worn into surgery.  Leave your suitcase in the car.  After surgery it may be brought to your room.  For patients admitted to the hospital, discharge time will be determined by your treatment team.  Patients discharged the day of surgery will not be allowed to drive home.   Name and phone number of your driver:    Special instructions:   Special Instructions: [place] - Preparing for Surgery  Before surgery, you can play an important role.  Because skin is not sterile, your skin needs to be as free of germs as possible.  You can reduce the number of germs on you skin by washing with CHG (chlorahexidine gluconate) soap before surgery.  CHG is an antiseptic cleaner which kills germs and bonds with the skin to continue killing germs even after washing.  Please DO NOT use if you have an allergy to CHG or antibacterial soaps.  If your skin becomes reddened/irritated stop using the CHG  and inform your nurse when you arrive at Short Stay.  Do not shave (including legs and underarms) for at least 48 hours prior to the first CHG shower.  You may shave your face.  Please follow these instructions carefully:   1.  Shower with CHG Soap the night before surgery and the morning of Surgery.  2.  If you choose to wash your hair, wash your hair first as usual with your normal shampoo.  3.  After you shampoo, rinse your hair and body thoroughly to remove the Shampoo.  4.  Use CHG as you would any other liquid soap.  You can apply chg directly  to the skin and wash gently with scrungie or a clean washcloth.  5.  Apply the CHG Soap to your body ONLY FROM THE NECK DOWN.  Do not use on open wounds or open sores.  Avoid contact with your eyes ears, mouth and genitals (private parts).  Wash genitals (private parts)       with your normal soap.  6.  Wash thoroughly, paying special attention to the area where your surgery will be performed.  7.  Thoroughly rinse your body with warm water from the neck down.  8.  DO NOT shower/wash with your normal soap after using and rinsing off the CHG Soap.  9.  Pat yourself dry with a clean towel.            10.  Wear  clean pajamas.            11.  Place clean sheets on your bed the night of your first shower and do not sleep with pets.  Day of Surgery  Do not apply any lotions/deodorants the morning of surgery.  Please wear clean clothes to the hospital/surgery center.  Please read over the following fact sheets that you were given. Pain Booklet, Coughing and Deep Breathing, Blood Transfusion Information, MRSA Information and Surgical Site Infection Prevention

## 2015-10-25 DIAGNOSIS — Z01818 Encounter for other preprocedural examination: Secondary | ICD-10-CM | POA: Diagnosis not present

## 2015-10-25 DIAGNOSIS — J189 Pneumonia, unspecified organism: Secondary | ICD-10-CM | POA: Diagnosis not present

## 2015-11-04 DIAGNOSIS — K921 Melena: Secondary | ICD-10-CM | POA: Diagnosis not present

## 2015-11-04 DIAGNOSIS — N950 Postmenopausal bleeding: Secondary | ICD-10-CM | POA: Diagnosis not present

## 2015-11-04 DIAGNOSIS — R1013 Epigastric pain: Secondary | ICD-10-CM | POA: Diagnosis not present

## 2015-11-10 DIAGNOSIS — N858 Other specified noninflammatory disorders of uterus: Secondary | ICD-10-CM | POA: Diagnosis not present

## 2015-11-10 DIAGNOSIS — Z1272 Encounter for screening for malignant neoplasm of vagina: Secondary | ICD-10-CM | POA: Diagnosis not present

## 2015-11-10 LAB — PROCEDURE REPORT - SCANNED: Pap: NEGATIVE

## 2015-12-03 DIAGNOSIS — J019 Acute sinusitis, unspecified: Secondary | ICD-10-CM | POA: Diagnosis not present

## 2015-12-16 ENCOUNTER — Other Ambulatory Visit: Payer: Self-pay | Admitting: Neurosurgery

## 2015-12-27 ENCOUNTER — Other Ambulatory Visit: Payer: Self-pay | Admitting: Neurosurgery

## 2015-12-28 ENCOUNTER — Encounter (HOSPITAL_COMMUNITY)
Admission: RE | Admit: 2015-12-28 | Discharge: 2015-12-28 | Disposition: A | Discharge status: Home / Self Care | Payer: Medicare Other | Source: Ambulatory Visit | Attending: Neurosurgery | Admitting: Neurosurgery

## 2015-12-28 ENCOUNTER — Other Ambulatory Visit (HOSPITAL_COMMUNITY): Payer: Self-pay | Admitting: *Deleted

## 2015-12-28 ENCOUNTER — Encounter (HOSPITAL_COMMUNITY): Payer: Self-pay

## 2015-12-28 DIAGNOSIS — M4806 Spinal stenosis, lumbar region: Secondary | ICD-10-CM | POA: Insufficient documentation

## 2015-12-28 DIAGNOSIS — Z01812 Encounter for preprocedural laboratory examination: Secondary | ICD-10-CM | POA: Insufficient documentation

## 2015-12-28 DIAGNOSIS — Z01818 Encounter for other preprocedural examination: Secondary | ICD-10-CM | POA: Insufficient documentation

## 2015-12-28 DIAGNOSIS — IMO0002 Reserved for concepts with insufficient information to code with codable children: Secondary | ICD-10-CM

## 2015-12-28 DIAGNOSIS — F17200 Nicotine dependence, unspecified, uncomplicated: Secondary | ICD-10-CM | POA: Diagnosis not present

## 2015-12-28 DIAGNOSIS — Z0183 Encounter for blood typing: Secondary | ICD-10-CM | POA: Diagnosis not present

## 2015-12-28 DIAGNOSIS — R918 Other nonspecific abnormal finding of lung field: Secondary | ICD-10-CM | POA: Insufficient documentation

## 2015-12-28 DIAGNOSIS — M48061 Spinal stenosis, lumbar region without neurogenic claudication: Secondary | ICD-10-CM

## 2015-12-28 DIAGNOSIS — J9811 Atelectasis: Secondary | ICD-10-CM | POA: Diagnosis not present

## 2015-12-28 HISTORY — DX: Pneumonia, unspecified organism: J18.9

## 2015-12-28 LAB — TYPE AND SCREEN
ABO/RH(D): O POS
Antibody Screen: NEGATIVE

## 2015-12-28 LAB — BASIC METABOLIC PANEL
ANION GAP: 8 (ref 5–15)
BUN: 12 mg/dL (ref 6–20)
CHLORIDE: 109 mmol/L (ref 101–111)
CO2: 23 mmol/L (ref 22–32)
Calcium: 9.4 mg/dL (ref 8.9–10.3)
Creatinine, Ser: 0.72 mg/dL (ref 0.44–1.00)
GFR calc Af Amer: >60 mL/min (ref >60)
GFR calc non Af Amer: >60 mL/min (ref >60)
GLUCOSE: 84 mg/dL (ref 65–99)
POTASSIUM: 4.3 mmol/L (ref 3.5–5.1)
Sodium: 140 mmol/L (ref 135–145)

## 2015-12-28 LAB — CBC
HCT: 39.8 % (ref 36.0–46.0)
Hemoglobin: 12.5 g/dL (ref 12.0–15.0)
MCH: 25.2 pg — AB (ref 26.0–34.0)
MCHC: 31.4 g/dL (ref 30.0–36.0)
MCV: 80.1 fL (ref 78.0–100.0)
Platelets: 284 10*3/uL (ref 150–400)
RBC: 4.97 MIL/uL (ref 3.87–5.11)
RDW: 15.1 % (ref 11.5–15.5)
WBC: 6.5 10*3/uL (ref 4.0–10.5)

## 2015-12-28 LAB — SURGICAL PCR SCREEN
MRSA, PCR: NEGATIVE
STAPHYLOCOCCUS AUREUS: NEGATIVE

## 2015-12-28 NOTE — Pre-Procedure Instructions (Signed)
Julie Obrien  12/28/2015      [id number] - Julie Obrien, [name] - [name] Phone: [phone] Fax: [phone]    Your procedure is scheduled on  01/05/2016.  Report to [facility] Admitting at 6:30 A.M.  Call this number if you have problems the morning of surgery:  [phone]   Remember:  Do not eat food or drink liquids after midnight.  On TUESDAY  Take these medicines the morning of surgery with A SIP OF WATER : Oxycodone is OK if needed    Do not wear jewelry, make-up or nail polish.   Do not wear lotions, powders, or perfumes.  You may wear deodorant.   Do not shave 48 hours prior to surgery.     Do not bring valuables to the hospital.   [facility] is not responsible for any belongings or valuables.  Contacts, dentures or bridgework may not be worn into surgery.  Leave your suitcase in the car.  After surgery it may be brought to your room.  For patients admitted to the hospital, discharge time will be determined by your treatment team.  Patients discharged the day of surgery will not be allowed to drive home.   Name and phone number of your driver:   /w family  Special instructions:  Special Instructions: [place] - Preparing for Surgery  Before surgery, you can play an important role.  Because skin is not sterile, your skin needs to be as free of germs as possible.  You can reduce the number of germs on you skin by washing with CHG (chlorahexidine gluconate) soap before surgery.  CHG is an antiseptic cleaner which kills germs and bonds with the skin to continue killing germs even after washing.  Please DO NOT use if you have an allergy to CHG or antibacterial soaps.  If your skin becomes reddened/irritated stop using the CHG and inform your nurse when you arrive at Short Stay.  Do not shave (including legs and underarms) for at least 48 hours prior to the first CHG shower.  You may shave  your face.  Please follow these instructions carefully:   1.  Shower with CHG Soap the night before surgery and the  morning of Surgery.  2.  If you choose to wash your hair, wash your hair first as usual with your  normal shampoo.  3.  After you shampoo, rinse your hair and body thoroughly to remove the  Shampoo.  4.  Use CHG as you would any other liquid soap.  You can apply chg directly to the skin and wash gently with scrungie or a clean washcloth.  5.  Apply the CHG Soap to your body ONLY FROM THE NECK DOWN.    Do not use on open wounds or open sores.  Avoid contact with your eyes, ears, mouth and genitals (private parts).  Wash genitals (private parts)   with your normal soap.  6.  Wash thoroughly, paying special attention to the area where your surgery will be performed.  7.  Thoroughly rinse your body with warm water from the neck down.  8.  DO NOT shower/wash with your normal soap after using and rinsing off   the CHG Soap.  9.  Pat yourself dry with a clean towel.            10.  Wear clean pajamas.  11.  Place clean sheets on your bed the night of your first shower and do not sleep with pets.  Day of Surgery  Do not apply any lotions/deodorants the morning of surgery.  Please wear clean clothes to the hospital/surgery center.  Please read over the following fact sheets that you were given. Pain Booklet, Coughing and Deep Breathing, Blood Transfusion Information, MRSA Information and Surgical Site Infection Prevention

## 2015-12-28 NOTE — Progress Notes (Signed)
Pt. Followed by Dr. Johnnye Lana for PCP, pt. Reports that she is feeling well, post pneumonia, she denies cough or needing any med. For resp, flu, cold like symptoms. Pt. Denies ever having any cardiac testing.

## 2015-12-29 NOTE — Progress Notes (Signed)
Anesthesia Chart Review:  Pt is a 65 year old female scheduled for L2-3, L3-4 PLIF, removal of hardware L4-S1 on 01/05/2016 with Dr. Saintclair Halsted.   Surgery was originally scheduled for 10/2015 but was postponed due to CAP.   PMH includes:  Post-op N/V. Current smoker. BMI 32. S/p cervical decompression and laminectomy 05/31/15.   Preoperative labs reviewed.    Chest x-ray 12/28/15 reviewed. Basilar linear atelectasis and probable scarring. No definite active process.  Pt appears to have recovered from CAP. If no changes, I anticipate pt can proceed with surgery as scheduled.   Willeen Cass, FNP-BC [facility] Short Stay Surgical Center/Anesthesiology Phone: [phone] 12/29/2015 3:13 PM

## 2016-01-04 MED ORDER — DEXAMETHASONE SODIUM PHOSPHATE 10 MG/ML IJ SOLN: 10.0000 mg | INTRAMUSCULAR | Status: DC

## 2016-01-04 MED ORDER — CEFAZOLIN SODIUM-DEXTROSE 2-4 GM/100ML-% IV SOLN
2.0000 g | INTRAVENOUS | Status: AC
  Administered 2016-01-05: 2 g via INTRAVENOUS

## 2016-01-05 ENCOUNTER — Inpatient Hospital Stay (HOSPITAL_COMMUNITY): Payer: Medicare Other | Admitting: Certified Registered Nurse Anesthetist

## 2016-01-05 ENCOUNTER — Encounter (HOSPITAL_COMMUNITY)
Admission: RE | Disposition: A | Discharge status: Home / Self Care | Payer: Self-pay | Source: Ambulatory Visit | Attending: Neurosurgery

## 2016-01-05 ENCOUNTER — Encounter (HOSPITAL_COMMUNITY): Payer: Self-pay | Admitting: Certified Registered Nurse Anesthetist

## 2016-01-05 ENCOUNTER — Inpatient Hospital Stay (HOSPITAL_COMMUNITY): Payer: Medicare Other

## 2016-01-05 ENCOUNTER — Inpatient Hospital Stay (HOSPITAL_COMMUNITY)
Admission: RE | Admit: 2016-01-05 | Discharge: 2016-01-07 | DRG: 460 | Disposition: A | Discharge status: Home / Self Care | Payer: Medicare Other | Source: Ambulatory Visit | Attending: Neurosurgery | Admitting: Neurosurgery

## 2016-01-05 ENCOUNTER — Inpatient Hospital Stay (HOSPITAL_COMMUNITY): Payer: Medicare Other | Admitting: Vascular Surgery

## 2016-01-05 DIAGNOSIS — Z79899 Other long term (current) drug therapy: Secondary | ICD-10-CM | POA: Diagnosis not present

## 2016-01-05 DIAGNOSIS — M5126 Other intervertebral disc displacement, lumbar region: Secondary | ICD-10-CM | POA: Diagnosis not present

## 2016-01-05 DIAGNOSIS — E669 Obesity, unspecified: Secondary | ICD-10-CM | POA: Diagnosis present

## 2016-01-05 DIAGNOSIS — M4806 Spinal stenosis, lumbar region: Principal | ICD-10-CM | POA: Diagnosis present

## 2016-01-05 DIAGNOSIS — Z881 Allergy status to other antibiotic agents status: Secondary | ICD-10-CM

## 2016-01-05 DIAGNOSIS — M48061 Spinal stenosis, lumbar region without neurogenic claudication: Secondary | ICD-10-CM | POA: Diagnosis present

## 2016-01-05 DIAGNOSIS — Z419 Encounter for procedure for purposes other than remedying health state, unspecified: Secondary | ICD-10-CM

## 2016-01-05 DIAGNOSIS — Z981 Arthrodesis status: Secondary | ICD-10-CM | POA: Diagnosis not present

## 2016-01-05 DIAGNOSIS — Z6831 Body mass index (BMI) 31.0-31.9, adult: Secondary | ICD-10-CM

## 2016-01-05 DIAGNOSIS — M549 Dorsalgia, unspecified: Secondary | ICD-10-CM | POA: Diagnosis not present

## 2016-01-05 DIAGNOSIS — F17210 Nicotine dependence, cigarettes, uncomplicated: Secondary | ICD-10-CM | POA: Diagnosis present

## 2016-01-05 DIAGNOSIS — M5116 Intervertebral disc disorders with radiculopathy, lumbar region: Secondary | ICD-10-CM | POA: Diagnosis not present

## 2016-01-05 SURGERY — POSTERIOR LUMBAR FUSION 2 WITH HARDWARE REMOVAL
Anesthesia: General | Site: Back

## 2016-01-05 MED ORDER — AZITHROMYCIN 250 MG PO TABS: 250.0000 mg | ORAL_TABLET | Freq: Once | ORAL | Status: DC

## 2016-01-05 MED ORDER — GLYCOPYRROLATE 0.2 MG/ML IJ SOLN
INTRAMUSCULAR | Status: DC | PRN
  Administered 2016-01-05: 0.6 mg via INTRAVENOUS

## 2016-01-05 MED ORDER — ROCURONIUM BROMIDE 50 MG/5ML IV SOLN
INTRAVENOUS | Status: AC
  Filled 2016-01-05: qty 1

## 2016-01-05 MED ORDER — SODIUM CHLORIDE 0.9% FLUSH: 3.0000 mL | INTRAVENOUS | Status: DC | PRN

## 2016-01-05 MED ORDER — ARTIFICIAL TEARS OP OINT
TOPICAL_OINTMENT | OPHTHALMIC | Status: AC
  Filled 2016-01-05: qty 3.5

## 2016-01-05 MED ORDER — SODIUM CHLORIDE 0.9 % IJ SOLN
INTRAMUSCULAR | Status: AC
  Filled 2016-01-05: qty 10

## 2016-01-05 MED ORDER — NEOSTIGMINE METHYLSULFATE 5 MG/5ML IV SOSY
PREFILLED_SYRINGE | INTRAVENOUS | Status: AC
  Filled 2016-01-05: qty 5

## 2016-01-05 MED ORDER — FENTANYL CITRATE (PF) 250 MCG/5ML IJ SOLN
INTRAMUSCULAR | Status: AC
  Filled 2016-01-05: qty 5

## 2016-01-05 MED ORDER — DOCUSATE SODIUM 100 MG PO CAPS
100.0000 mg | ORAL_CAPSULE | Freq: Two times a day (BID) | ORAL | Status: DC
  Administered 2016-01-05 – 2016-01-07 (×4): 100 mg via ORAL
  Filled 2016-01-05 (×4): qty 1

## 2016-01-05 MED ORDER — BUPIVACAINE LIPOSOME 1.3 % IJ SUSP
INTRAMUSCULAR | Status: DC | PRN
  Administered 2016-01-05: 20 mL

## 2016-01-05 MED ORDER — THROMBIN 20000 UNITS EX SOLR
CUTANEOUS | Status: DC | PRN
  Administered 2016-01-05 (×2): via TOPICAL

## 2016-01-05 MED ORDER — DEXAMETHASONE SODIUM PHOSPHATE 10 MG/ML IJ SOLN
INTRAMUSCULAR | Status: AC
  Filled 2016-01-05: qty 1

## 2016-01-05 MED ORDER — CEFAZOLIN SODIUM-DEXTROSE 2-4 GM/100ML-% IV SOLN
INTRAVENOUS | Status: AC
  Filled 2016-01-05: qty 100

## 2016-01-05 MED ORDER — HYDROMORPHONE HCL 1 MG/ML IJ SOLN
INTRAMUSCULAR | Status: AC
  Filled 2016-01-05: qty 1

## 2016-01-05 MED ORDER — FENTANYL CITRATE (PF) 100 MCG/2ML IJ SOLN
25.0000 ug | INTRAMUSCULAR | Status: DC | PRN
  Administered 2016-01-05 (×2): 50 ug via INTRAVENOUS

## 2016-01-05 MED ORDER — PHENYLEPHRINE HCL 10 MG/ML IJ SOLN
INTRAMUSCULAR | Status: DC | PRN
  Administered 2016-01-05 (×3): 80 ug via INTRAVENOUS

## 2016-01-05 MED ORDER — FENTANYL CITRATE (PF) 100 MCG/2ML IJ SOLN
INTRAMUSCULAR | Status: DC | PRN
  Administered 2016-01-05 (×4): 50 ug via INTRAVENOUS
  Administered 2016-01-05: 150 ug via INTRAVENOUS
  Administered 2016-01-05 (×3): 50 ug via INTRAVENOUS

## 2016-01-05 MED ORDER — PHENYLEPHRINE-DM-GG-APAP 5-10-200-325 MG PO TABS: 2.0000 | ORAL_TABLET | Freq: Every day | ORAL | Status: DC | PRN

## 2016-01-05 MED ORDER — VECURONIUM BROMIDE 10 MG IV SOLR
INTRAVENOUS | Status: AC
  Filled 2016-01-05: qty 10

## 2016-01-05 MED ORDER — ACETAMINOPHEN 650 MG RE SUPP: 650.0000 mg | RECTAL | Status: DC | PRN

## 2016-01-05 MED ORDER — DM-GUAIFENESIN ER 30-600 MG PO TB12: 1.0000 | ORAL_TABLET | Freq: Two times a day (BID) | ORAL | Status: DC | PRN

## 2016-01-05 MED ORDER — BENZONATATE 100 MG PO CAPS: 200.0000 mg | ORAL_CAPSULE | Freq: Two times a day (BID) | ORAL | Status: DC | PRN

## 2016-01-05 MED ORDER — ACETAMINOPHEN 325 MG PO TABS
650.0000 mg | ORAL_TABLET | ORAL | Status: DC | PRN
Start: 2016-01-05 — End: 2016-01-07

## 2016-01-05 MED ORDER — VANCOMYCIN HCL 1000 MG IV SOLR
INTRAVENOUS | Status: DC | PRN
  Administered 2016-01-05: 1000 mg via TOPICAL

## 2016-01-05 MED ORDER — MENTHOL 3 MG MT LOZG: 1.0000 | LOZENGE | OROMUCOSAL | Status: DC | PRN

## 2016-01-05 MED ORDER — ONDANSETRON HCL 4 MG/2ML IJ SOLN
INTRAMUSCULAR | Status: DC | PRN
  Administered 2016-01-05: 4 mg via INTRAVENOUS

## 2016-01-05 MED ORDER — PHENYLEPHRINE HCL 10 MG/ML IJ SOLN
10000.0000 ug | INTRAMUSCULAR | Status: DC | PRN
  Administered 2016-01-05: 60 ug/min via INTRAVENOUS

## 2016-01-05 MED ORDER — VECURONIUM BROMIDE 10 MG IV SOLR
INTRAVENOUS | Status: DC | PRN
  Administered 2016-01-05: 2 mg via INTRAVENOUS
  Administered 2016-01-05: 1 mg via INTRAVENOUS

## 2016-01-05 MED ORDER — HYDROMORPHONE HCL 1 MG/ML IJ SOLN
0.5000 mg | INTRAMUSCULAR | Status: DC | PRN
  Administered 2016-01-05 – 2016-01-06 (×3): 1 mg via INTRAVENOUS
  Filled 2016-01-05 (×3): qty 1

## 2016-01-05 MED ORDER — ONDANSETRON HCL 4 MG/2ML IJ SOLN
INTRAMUSCULAR | Status: AC
  Filled 2016-01-05: qty 2

## 2016-01-05 MED ORDER — SCOPOLAMINE 1 MG/3DAYS TD PT72
MEDICATED_PATCH | TRANSDERMAL | Status: AC
  Filled 2016-01-05: qty 1

## 2016-01-05 MED ORDER — CEFAZOLIN SODIUM-DEXTROSE 2-4 GM/100ML-% IV SOLN
2.0000 g | Freq: Three times a day (TID) | INTRAVENOUS | Status: AC
  Administered 2016-01-05 – 2016-01-07 (×6): 2 g via INTRAVENOUS
  Filled 2016-01-05 (×5): qty 100

## 2016-01-05 MED ORDER — SODIUM CHLORIDE 0.9 % IR SOLN
Status: DC | PRN
  Administered 2016-01-05: 08:00:00

## 2016-01-05 MED ORDER — ONDANSETRON HCL 4 MG/2ML IJ SOLN: 4.0000 mg | INTRAMUSCULAR | Status: DC | PRN

## 2016-01-05 MED ORDER — GLYCOPYRROLATE 0.2 MG/ML IV SOSY
PREFILLED_SYRINGE | INTRAVENOUS | Status: AC
  Filled 2016-01-05: qty 3

## 2016-01-05 MED ORDER — VANCOMYCIN HCL 1000 MG IV SOLR
INTRAVENOUS | Status: AC
  Filled 2016-01-05: qty 1000

## 2016-01-05 MED ORDER — SODIUM CHLORIDE 0.9 % IV SOLN: 250.0000 mL | INTRAVENOUS | Status: DC

## 2016-01-05 MED ORDER — EPHEDRINE SULFATE 50 MG/ML IJ SOLN
INTRAMUSCULAR | Status: DC | PRN
  Administered 2016-01-05 (×2): 5 mg via INTRAVENOUS

## 2016-01-05 MED ORDER — LORATADINE 10 MG PO TABS
10.0000 mg | ORAL_TABLET | Freq: Every day | ORAL | Status: DC
  Administered 2016-01-06 – 2016-01-07 (×2): 10 mg via ORAL
  Filled 2016-01-05 (×2): qty 1

## 2016-01-05 MED ORDER — ROCURONIUM BROMIDE 100 MG/10ML IV SOLN
INTRAVENOUS | Status: DC | PRN
  Administered 2016-01-05: 50 mg via INTRAVENOUS

## 2016-01-05 MED ORDER — PHENYLEPHRINE 40 MCG/ML (10ML) SYRINGE FOR IV PUSH (FOR BLOOD PRESSURE SUPPORT)
PREFILLED_SYRINGE | INTRAVENOUS | Status: AC
Start: 2016-01-05 — End: 2016-01-05
  Filled 2016-01-05: qty 10

## 2016-01-05 MED ORDER — POLYETHYLENE GLYCOL 3350 17 G PO PACK
17.0000 g | PACK | Freq: Every day | ORAL | Status: DC
  Administered 2016-01-05 – 2016-01-07 (×3): 17 g via ORAL
  Filled 2016-01-05 (×3): qty 1

## 2016-01-05 MED ORDER — DEXAMETHASONE SODIUM PHOSPHATE 10 MG/ML IJ SOLN
INTRAMUSCULAR | Status: DC | PRN
Start: 2016-01-05 — End: 2016-01-05
  Administered 2016-01-05: 10 mg via INTRAVENOUS

## 2016-01-05 MED ORDER — LIDOCAINE 2% (20 MG/ML) 5 ML SYRINGE
INTRAMUSCULAR | Status: AC
  Filled 2016-01-05: qty 5

## 2016-01-05 MED ORDER — PROPOFOL 10 MG/ML IV BOLUS
INTRAVENOUS | Status: AC
  Filled 2016-01-05: qty 20

## 2016-01-05 MED ORDER — LIDOCAINE-EPINEPHRINE 1 %-1:100000 IJ SOLN
INTRAMUSCULAR | Status: DC | PRN
  Administered 2016-01-05: 10 mL

## 2016-01-05 MED ORDER — SODIUM CHLORIDE 0.9% FLUSH
3.0000 mL | Freq: Two times a day (BID) | INTRAVENOUS | Status: DC
  Administered 2016-01-05 – 2016-01-06 (×3): 3 mL via INTRAVENOUS

## 2016-01-05 MED ORDER — MIDAZOLAM HCL 2 MG/2ML IJ SOLN
INTRAMUSCULAR | Status: AC
  Filled 2016-01-05: qty 2

## 2016-01-05 MED ORDER — NEOSTIGMINE METHYLSULFATE 10 MG/10ML IV SOLN
INTRAVENOUS | Status: DC | PRN
  Administered 2016-01-05: 4 mg via INTRAVENOUS

## 2016-01-05 MED ORDER — OXYCODONE-ACETAMINOPHEN 5-325 MG PO TABS
1.0000 | ORAL_TABLET | ORAL | Status: DC | PRN
  Administered 2016-01-05 – 2016-01-07 (×8): 2 via ORAL
  Filled 2016-01-05 (×8): qty 2

## 2016-01-05 MED ORDER — FENTANYL CITRATE (PF) 100 MCG/2ML IJ SOLN
INTRAMUSCULAR | Status: AC
  Filled 2016-01-05: qty 2

## 2016-01-05 MED ORDER — SCOPOLAMINE 1 MG/3DAYS TD PT72
MEDICATED_PATCH | TRANSDERMAL | Status: DC | PRN
  Administered 2016-01-05: 1 via TRANSDERMAL

## 2016-01-05 MED ORDER — EPHEDRINE 5 MG/ML INJ
INTRAVENOUS | Status: AC
  Filled 2016-01-05: qty 10

## 2016-01-05 MED ORDER — BUPIVACAINE LIPOSOME 1.3 % IJ SUSP
20.0000 mL | INTRAMUSCULAR | Status: AC
  Filled 2016-01-05: qty 20

## 2016-01-05 MED ORDER — LACTATED RINGERS IV SOLN
INTRAVENOUS | Status: DC | PRN
  Administered 2016-01-05 (×2): via INTRAVENOUS

## 2016-01-05 MED ORDER — ONDANSETRON HCL 4 MG/2ML IJ SOLN: 4.0000 mg | Freq: Once | INTRAMUSCULAR | Status: DC | PRN

## 2016-01-05 MED ORDER — MIDAZOLAM HCL 5 MG/5ML IJ SOLN
INTRAMUSCULAR | Status: DC | PRN
  Administered 2016-01-05: 2 mg via INTRAVENOUS

## 2016-01-05 MED ORDER — 0.9 % SODIUM CHLORIDE (POUR BTL) OPTIME
TOPICAL | Status: DC | PRN
  Administered 2016-01-05: 1000 mL

## 2016-01-05 MED ORDER — LIDOCAINE HCL (CARDIAC) 20 MG/ML IV SOLN
INTRAVENOUS | Status: DC | PRN
  Administered 2016-01-05: 100 mg via INTRAVENOUS

## 2016-01-05 MED ORDER — PROPOFOL 10 MG/ML IV BOLUS
INTRAVENOUS | Status: DC | PRN
  Administered 2016-01-05: 150 mg via INTRAVENOUS

## 2016-01-05 MED ORDER — PHENOL 1.4 % MT LIQD: 1.0000 | OROMUCOSAL | Status: DC | PRN

## 2016-01-05 MED ORDER — CYCLOBENZAPRINE HCL 10 MG PO TABS
10.0000 mg | ORAL_TABLET | Freq: Three times a day (TID) | ORAL | Status: DC | PRN
  Administered 2016-01-05 – 2016-01-07 (×3): 10 mg via ORAL
  Filled 2016-01-05 (×3): qty 1

## 2016-01-05 MED ORDER — CYCLOBENZAPRINE HCL 10 MG PO TABS: 10.0000 mg | ORAL_TABLET | Freq: Three times a day (TID) | ORAL | Status: DC | PRN

## 2016-01-05 MED FILL — Heparin Sodium (Porcine) Inj 1000 Unit/ML: INTRAMUSCULAR | Qty: 30 | Status: AC

## 2016-01-05 MED FILL — Sodium Chloride IV Soln 0.9%: INTRAVENOUS | Qty: 1000 | Status: AC

## 2016-01-05 SURGICAL SUPPLY — 80 items
BAG DECANTER FOR FLEXI CONT (MISCELLANEOUS) ×3 IMPLANT
BENZOIN TINCTURE PRP APPL 2/3 (GAUZE/BANDAGES/DRESSINGS) ×3 IMPLANT
BLADE CLIPPER SURG (BLADE) IMPLANT
BLADE SURG 11 STRL SS (BLADE) ×3 IMPLANT
BRUSH SCRUB EZ PLAIN DRY (MISCELLANEOUS) ×3 IMPLANT
BUR MATCHSTICK NEURO 3.0 LAGG (BURR) ×6 IMPLANT
BUR PRECISION FLUTE 6.0 (BURR) ×3 IMPLANT
CANISTER SUCT 3000ML PPV (MISCELLANEOUS) ×3 IMPLANT
CAP REVERE LOCKING (Cap) ×12 IMPLANT
CLOSURE WOUND 1/2 X4 (GAUZE/BANDAGES/DRESSINGS) ×1
CONN CROSSLINK REV 6.35 48-60 (Connector) ×3 IMPLANT
CONNECTOR CRSLNK REV6.35 48-60 (Connector) ×1 IMPLANT
CONT SPEC 4OZ CLIKSEAL STRL BL (MISCELLANEOUS) ×3 IMPLANT
COVER BACK TABLE 24X17X13 BIG (DRAPES) IMPLANT
COVER BACK TABLE 60X90IN (DRAPES) ×3 IMPLANT
DECANTER SPIKE VIAL GLASS SM (MISCELLANEOUS) ×3 IMPLANT
DRAPE C-ARM 42X72 X-RAY (DRAPES) ×3 IMPLANT
DRAPE C-ARMOR (DRAPES) ×3 IMPLANT
DRAPE LAPAROTOMY 100X72X124 (DRAPES) ×3 IMPLANT
DRAPE POUCH INSTRU U-SHP 10X18 (DRAPES) ×3 IMPLANT
DRAPE PROXIMA HALF (DRAPES) IMPLANT
DRAPE SURG 17X23 STRL (DRAPES) ×3 IMPLANT
DRSG OPSITE 4X5.5 SM (GAUZE/BANDAGES/DRESSINGS) ×3 IMPLANT
DRSG OPSITE POSTOP 4X8 (GAUZE/BANDAGES/DRESSINGS) ×3 IMPLANT
DURAPREP 26ML APPLICATOR (WOUND CARE) ×3 IMPLANT
ELECT REM PT RETURN 9FT ADLT (ELECTROSURGICAL) ×3
ELECTRODE REM PT RTRN 9FT ADLT (ELECTROSURGICAL) ×1 IMPLANT
EVACUATOR 3/16  PVC DRAIN (DRAIN) ×2
EVACUATOR 3/16 PVC DRAIN (DRAIN) ×1 IMPLANT
GAUZE SPONGE 4X4 12PLY STRL (GAUZE/BANDAGES/DRESSINGS) ×3 IMPLANT
GAUZE SPONGE 4X4 16PLY XRAY LF (GAUZE/BANDAGES/DRESSINGS) ×3 IMPLANT
GLOVE BIO SURGEON STRL SZ8 (GLOVE) ×6 IMPLANT
GLOVE BIOGEL PI IND STRL 6.5 (GLOVE) ×3 IMPLANT
GLOVE BIOGEL PI IND STRL 7.5 (GLOVE) ×1 IMPLANT
GLOVE BIOGEL PI INDICATOR 6.5 (GLOVE) ×6
GLOVE BIOGEL PI INDICATOR 7.5 (GLOVE) ×2
GLOVE EXAM NITRILE LRG STRL (GLOVE) IMPLANT
GLOVE EXAM NITRILE MD LF STRL (GLOVE) IMPLANT
GLOVE EXAM NITRILE XL STR (GLOVE) IMPLANT
GLOVE EXAM NITRILE XS STR PU (GLOVE) IMPLANT
GLOVE INDICATOR 7.0 STRL GRN (GLOVE) ×3 IMPLANT
GLOVE INDICATOR 7.5 STRL GRN (GLOVE) ×3 IMPLANT
GLOVE INDICATOR 8.5 STRL (GLOVE) ×6 IMPLANT
GLOVE SS BIOGEL STRL SZ 7 (GLOVE) ×1 IMPLANT
GLOVE SUPERSENSE BIOGEL SZ 7 (GLOVE) ×2
GOWN STRL REUS W/ TWL LRG LVL3 (GOWN DISPOSABLE) IMPLANT
GOWN STRL REUS W/ TWL XL LVL3 (GOWN DISPOSABLE) ×2 IMPLANT
GOWN STRL REUS W/TWL 2XL LVL3 (GOWN DISPOSABLE) IMPLANT
GOWN STRL REUS W/TWL LRG LVL3 (GOWN DISPOSABLE)
GOWN STRL REUS W/TWL XL LVL3 (GOWN DISPOSABLE) ×4
KIT BASIN OR (CUSTOM PROCEDURE TRAY) ×3 IMPLANT
KIT INFUSE XX SMALL 0.7CC (Orthopedic Implant) ×3 IMPLANT
KIT ROOM TURNOVER OR (KITS) ×3 IMPLANT
LIQUID BAND (GAUZE/BANDAGES/DRESSINGS) ×3 IMPLANT
MILL MEDIUM DISP (BLADE) ×3 IMPLANT
NEEDLE HYPO 21X1.5 SAFETY (NEEDLE) ×6 IMPLANT
NEEDLE HYPO 25X1 1.5 SAFETY (NEEDLE) ×3 IMPLANT
NS IRRIG 1000ML POUR BTL (IV SOLUTION) ×3 IMPLANT
PACK LAMINECTOMY NEURO (CUSTOM PROCEDURE TRAY) ×3 IMPLANT
PAD ARMBOARD 7.5X6 YLW CONV (MISCELLANEOUS) ×9 IMPLANT
PATTIES SURGICAL 1X1 (DISPOSABLE) ×3 IMPLANT
PUTTY BONE DBX 5CC MIX (Putty) ×3 IMPLANT
ROD REVERE CURVED 65MM (Rod) ×6 IMPLANT
SCREW REVERE 6.35 6.5MMX45 (Screw) ×12 IMPLANT
SCREW SET BREAK OFF (Screw) ×6 IMPLANT
SPACER RISE 10X22 8-14MM-10 (Spacer) ×12 IMPLANT
SPONGE LAP 4X18 X RAY DECT (DISPOSABLE) IMPLANT
SPONGE SURGIFOAM ABS GEL 100 (HEMOSTASIS) ×6 IMPLANT
STRIP CLOSURE SKIN 1/2X4 (GAUZE/BANDAGES/DRESSINGS) ×2 IMPLANT
SUT BONE WAX W31G (SUTURE) ×3 IMPLANT
SUT VIC AB 0 CT1 18XCR BRD8 (SUTURE) ×1 IMPLANT
SUT VIC AB 0 CT1 8-18 (SUTURE) ×2
SUT VIC AB 2-0 CT1 18 (SUTURE) ×3 IMPLANT
SUT VIC AB 4-0 PS2 27 (SUTURE) ×3 IMPLANT
SYR 20CC LL (SYRINGE) ×6 IMPLANT
TOWEL OR 17X24 6PK STRL BLUE (TOWEL DISPOSABLE) ×3 IMPLANT
TOWEL OR 17X26 10 PK STRL BLUE (TOWEL DISPOSABLE) ×3 IMPLANT
TRAY FOLEY CATH 16FRSI W/METER (SET/KITS/TRAYS/PACK) ×3 IMPLANT
TRAY FOLEY W/METER SILVER 14FR (SET/KITS/TRAYS/PACK) IMPLANT
WATER STERILE IRR 1000ML POUR (IV SOLUTION) ×3 IMPLANT

## 2016-01-05 NOTE — Anesthesia Preprocedure Evaluation (Addendum)
Anesthesia Evaluation  Patient identified by MRN, date of birth, ID band Patient awake    Reviewed: Allergy & Precautions, NPO status , Patient's Chart, lab work & pertinent test results  History of Anesthesia Complications (+) PONV and history of anesthetic complications  Airway Mallampati: II  TM Distance: >3 FB Neck ROM: Limited    Dental  (+) Partial Lower, Partial Upper, Dental Advisory Given   Pulmonary neg shortness of breath, neg sleep apnea, neg COPD, neg recent URI, Current Smoker (0.5 PPD),    Pulmonary exam normal breath sounds clear to auscultation       Cardiovascular (-) hypertension(-) angina(-) Past MI negative cardio ROS Normal cardiovascular exam Rhythm:Regular     Neuro/Psych neg Seizures  Neuromuscular disease (BLE numbness, tingling R>L) negative psych ROS   GI/Hepatic negative GI ROS, Neg liver ROS,   Endo/Other  Obesity   Renal/GU negative Renal ROS     Musculoskeletal  (+) Arthritis , Osteoarthritis,    Abdominal   Peds  Hematology negative hematology ROS (+)   Anesthesia Other Findings Day of surgery medications reviewed with the patient.  Reproductive/Obstetrics                            Anesthesia Physical Anesthesia Plan  ASA: II  Anesthesia Plan: General   Post-op Pain Management:    Induction: Intravenous  Airway Management Planned: Oral ETT and Video Laryngoscope Planned  Additional Equipment: None  Intra-op Plan:   Post-operative Plan: Extubation in OR  Informed Consent: I have reviewed the patients History and Physical, chart, labs and discussed the procedure including the risks, benefits and alternatives for the proposed anesthesia with the patient or authorized representative who has indicated his/her understanding and acceptance.   Dental advisory given  Plan Discussed with: CRNA and Surgeon  Anesthesia Plan Comments:          Anesthesia Quick Evaluation

## 2016-01-05 NOTE — Anesthesia Procedure Notes (Signed)
Procedure Name: Intubation Date/Time: 01/05/2016 8:23 AM Performed by: Shirlyn Goltz Pre-anesthesia Checklist: Patient identified, Emergency Drugs available, Suction available and Patient being monitored Patient Re-evaluated:Patient Re-evaluated prior to inductionOxygen Delivery Method: Circle system utilized Preoxygenation: Pre-oxygenation with 100% oxygen Intubation Type: IV induction Ventilation: Two handed mask ventilation required and Oral airway inserted - appropriate to patient size Laryngoscope Size: Mac and 3 Grade View: Grade I Tube type: Oral Tube size: 7.5 mm Number of attempts: 1 Airway Equipment and Method: Stylet Placement Confirmation: ETT inserted through vocal cords under direct vision Secured at: 23 cm Tube secured with: Tape Dental Injury: Teeth and Oropharynx as per pre-operative assessment

## 2016-01-05 NOTE — H&P (Signed)
Julie Obrien is an 65 y.o. female.   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 Due to her failure conservative treatment imaging findings and progressive clinical syndrome I recommended decompression and stabilization procedure with removal of hardware I've extensively reviewed the risks and benefits of the operation with the patient as well as perioperative course expectations of outcome and alternatives of surgery and she understands and agrees to proceed forward.  Past Medical History  Diagnosis Date  . Complication of anesthesia     nausea one surgery more recent  . PONV (postoperative nausea and vomiting)   . History of kidney stones   . Pneumonia     Feb.- Mar. 2017, not hospitalized, but pending surgery was cancelled ,since then, pt. seen by PCP, told that it was cleared    . Arthritis     hnp, low back , knees - " all OA"    Past Surgical History  Procedure Laterality Date  . Cesarean section      x4  . Tonsillectomy    . Cysto  99    stone  . Cervical discectomy  09  . Cyst excision      vocal cords?  . Posterior cervical fusion/foraminotomy N/A 05/31/2015    Procedure: Decompression and Laminectomy Cervical three-four  Cervical six-seven  Cervical seven-thoracic-one Posterior cervical fusion with lateral mass fixation;  Surgeon: Kary Kos, MD;  Location: [facility] NEURO ORS;  Service: Neurosurgery;  Laterality: N/A;  . Hardware removal N/A 06/21/2015    Procedure: Exploration of Fusion and removal of hardware right thoracic level one;  Surgeon: Kary Kos, MD;  Location: [place] NEURO ORS;  Service: Neurosurgery;  Laterality: N/A;  . Back surgery  08  . Tubal ligation      History reviewed. No pertinent family  history. Social History:  reports that she has been smoking Cigarettes.  She has a 5 pack-year smoking history. She does not have any smokeless tobacco history on file. She reports that she does not drink alcohol or use illicit drugs.  Allergies:  Allergies  Allergen Reactions  . Sulfa Antibiotics Hives    Medications Prior to Admission  Medication Sig Dispense Refill  . cetirizine (ZYRTEC) 10 MG tablet Take 10 mg by mouth daily as needed for allergies.    . cyclobenzaprine (FLEXERIL) 10 MG tablet Take 10 mg by mouth 3 (three) times daily as needed for muscle spasms.    Marland Kitchen azithromycin (ZITHROMAX Z-PAK) 250 MG tablet 2 po day one, then 1 daily x 4 days 5 tablet 0  . benzonatate (TESSALON) 100 MG capsule Take 2 capsules (200 mg total) by mouth 2 (two) times daily as needed for cough. 20 capsule 0  . dextromethorphan-guaiFENesin (MUCINEX DM) 30-600 MG 12hr tablet Take 1 tablet by mouth 2 (two) times daily as needed for cough.    Marland Kitchen ibuprofen (ADVIL,MOTRIN) 600 MG tablet Take 1 tablet (600 mg total) by mouth every 6 (six) hours as needed. 30 tablet 0  . oxyCODONE-acetaminophen (PERCOCET/ROXICET) 5-325 MG tablet Take 1-2 tablets by mouth every 4 (four) hours as needed for moderate pain. 60 tablet 0  . Phenylephrine-DM-GG-APAP (TYLENOL COLD MULTI-SYMPTOM) 5-10-200-325 MG TABS Take 2 capsules by mouth daily as needed (for cold).      No  results found for this or any previous visit (from the past 51 hour(s)). No results found.  Review of Systems  Constitutional: Negative.   HENT: Negative.   Eyes: Negative.   Respiratory: Negative.   Cardiovascular: Negative.   Gastrointestinal: Negative.   Genitourinary: Negative.   Musculoskeletal: Positive for myalgias, back pain and joint pain.  Skin: Negative.   Neurological: Positive for tingling and sensory change.  Psychiatric/Behavioral: Negative.     Blood pressure 110/57, pulse 72, temperature 98 F (36.7 C), temperature source Oral, resp.  rate 20, height 5\' 4"  (1.626 m), weight 84.369 kg (186 lb), SpO2 97 %. Physical Exam  Constitutional: She is oriented to person, place, and time. She appears well-developed and well-nourished.  HENT:  Head: Normocephalic.  Eyes: Pupils are equal, round, and reactive to light.  Neck: Normal range of motion.  Respiratory: Effort normal.  GI: Soft. Bowel sounds are normal.  Neurological: She is alert and oriented to person, place, and time. She has normal strength.  Strength is 5 out of 5 in her iliopsoas, quads, hip she's, gastrocs, and tibialis, EHL.  Skin: Skin is warm and dry.     Assessment/Plan 65 year old presents for decompression stabilization procedure with removal of hardware.  Julie Obrien P, MD 01/05/2016, 8:09 AM

## 2016-01-05 NOTE — Transfer of Care (Signed)
Immediate Anesthesia Transfer of Care Note  Patient: Julie Obrien  Procedure(s) Performed: Procedure(s): Posterior Lumbar Interbody Fusion  Lumbar two-three, lumbar three-four , Removal hardware Lumbar four to sacral one (N/A)  Patient Location: PACU  Anesthesia Type:General  Level of Consciousness: awake, alert , oriented and patient cooperative  Airway & Oxygen Therapy: Patient Spontanous Breathing and Patient connected to nasal cannula oxygen  Post-op Assessment: Report given to RN and Post -op Vital signs reviewed and stable  Post vital signs: Reviewed and stable  Last Vitals:  Filed Vitals:   01/05/16 0706  BP: 110/57  Pulse: 72  Temp: 36.7 C  Resp: 20    Last Pain:  Filed Vitals:   01/05/16 0710  PainSc: 5       Patients Stated Pain Goal: 2 ([id number])  Complications: No apparent anesthesia complications

## 2016-01-05 NOTE — Op Note (Signed)
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  Postoperative diagnosis: Same  Procedure: Reexploration of fusion removal of hardware L4-S1 with removal of bilateral L5 and S1 screws  #2 decompressive lumbar limiting the L2-3 L3-4 and excess and requiring more work to would be needed with a standard interbody fusion with complete facetectomies radical foraminotomies of the L2, L3, and L4 nerve roots.  #3 posterior lumbar interbody fusion using the globus rise expandable cage system packed with locally harvested autograft mixed with DBX next and BMP  #4 pedicle screw fixation L2-L4 utilizing the old 6.35 Legacy screws that remained and L4 by placement of new globus Revere 6.35 pedicle screws at L2 and L3  #5 posterior lateral arthrodesis L2-L4 utilizing the locally harvested autograft mixed with DBX mix and BMP.  Surgeon: Dominica Severin Mishawn Hemann parasitic assistant: Marland Kitchen ditty  Anesthesia: Gen.  EBL: Minimal less than 250  History of present illness: Patient is very pleasant 65 year old female is a progress worsening back and bilateral leg pain and workup revealed severe stenosis disc herniations and marked foraminal stenosis at L2-3 and L3-4 above the level of a previous L4-S1 fusion. Due to her failure conservative treatment imaging findings and progressive clinical syndrome I recommended decompression stabilization procedure from L2-L4 I extensively reviewed the risks and benefits of the operation with the patient as well as perioperative course expectations of outcome and alternatives of surgery she understands and agrees to proceed forward.  Operative procedure: Patient brought into the or was induced on general anesthesia positioned prone on the [facility] table back was prepped and draped in routine sterile fashion her old incision was opened up and extended cephalad subperiosteal dissections care  on the lamina of L2 and L3 exposing TPs of L2 and L3 respectively. The old hardware was identified the fusion was inspected I removed the nuts were removed the cross-link and remove the rods the fusion did appear to be solid so I removed L5 and S1 screws. Then dissected through scar tissue to complete central decompressions and complete medial facetectomies at L2-3 and L3-4. Marked spinal stenosis probably L2-3 was causing severe hourglass compression of thecal sac as well as L3-4 there was a large disc herniation laterally at L2-3 on the left as well as . disc herniations after complete radical foraminotomies were achieved disc spaces were cleaned out radically using sequential distraction with a 9 distractor in place after prep adequate discectomies and preparation the endplates and inserted 8 expandable 10 lordosis cage turn approximate 4 turns opened up the disc space level cage then removed the distractors packed autograft centrally prepare the contralateral endplates and discectomy prior to packing autograft BMP and inserted the contralateral cage. Then all for additional new screws were placed the globus 6.35 globus Revere set was used the screws were tapped probed and 6 5 x 45 mm screws were inserted then a 65 mm rods were placed all nuts were anchored in place aggressive decortication was carried after copious irrigation along the lateral gutters and the TPs from L2 to the lateral mass at L4 the locally harvested autograft mixed with DBX and BMP was packed posterior laterally then across it was applied nuts were anchored in place the foraminal reinspected confirm no migration of graft material a large retractor was placed and was sprinkled with vancomycin powder, experell injected in the fascia and the wounds closed in layers with after Vicryl running 4 subcuticular Dermabond benzo and Steri-Strips  and sterile dressing. At the case all needle counts sponge count were correct. Patient recovered in stable  condition.

## 2016-01-05 NOTE — Progress Notes (Signed)
Utilization review completed.  

## 2016-01-06 MED ORDER — PANTOPRAZOLE SODIUM 40 MG PO TBEC
40.0000 mg | DELAYED_RELEASE_TABLET | Freq: Two times a day (BID) | ORAL | Status: DC
  Administered 2016-01-06 – 2016-01-07 (×3): 40 mg via ORAL
  Filled 2016-01-06 (×3): qty 1

## 2016-01-06 MED ORDER — DEXAMETHASONE SODIUM PHOSPHATE 10 MG/ML IJ SOLN
10.0000 mg | Freq: Four times a day (QID) | INTRAMUSCULAR | Status: AC
  Administered 2016-01-06 (×4): 10 mg via INTRAVENOUS
  Filled 2016-01-06 (×3): qty 1

## 2016-01-06 NOTE — Evaluation (Signed)
Occupational Therapy Evaluation Patient Details Name: Julie Obrien MRN: [id number] DOB: 10-01-1950 Today's Date: 01/06/2016    History of Present Illness Pt is a 65 y/o female who presents s/p L4-S1 PLIF on 01/05/16.   Clinical Impression   Pt admitted with the above diagnosis and has the deficits listed below. Pt would benefit from continued OT to address step over tub transfers before returning home with her husband.  Husband is available 24/7 to assist.  Pt does have 16 steps to go upstairs. Pt can live on main floor but prefers not to.  Pt should be safe to d/c home with husband.      Follow Up Recommendations  No OT follow up;Supervision/Assistance - 24 hour    Equipment Recommendations  None recommended by OT    Recommendations for Other Services       Precautions / Restrictions Precautions Precautions: Fall;Back Precaution Booklet Issued: Yes (comment) Precaution Comments: Reviewed handout and pt was cued for precautions during functional mobility.  Required Braces or Orthoses: Spinal Brace Spinal Brace: Lumbar corset;Applied in sitting position Restrictions Weight Bearing Restrictions: No      Mobility Bed Mobility Overal bed mobility: Needs Assistance Bed Mobility: Rolling;Sidelying to Sit Rolling: Supervision Sidelying to sit: Supervision       General bed mobility comments: Pt used bedrails but did not need hands on assist.  Transfers Overall transfer level: Needs assistance Equipment used: Rolling walker (2 wheeled) Transfers: Sit to/from Stand Sit to Stand: Min guard         General transfer comment: Multiple cues required for correct hand placement on walker.    Balance Overall balance assessment: Needs assistance Sitting-balance support: Feet supported Sitting balance-Leahy Scale: Good     Standing balance support: Bilateral upper extremity supported;During functional activity Standing balance-Leahy Scale: Fair Standing balance  comment: Feel pt needs walker at this time when standing to remain safe.                            ADL Overall ADL's : Needs assistance/impaired Eating/Feeding: Independent;Sitting   Grooming: Wash/dry hands;Wash/dry face;Oral care;Supervision/safety;Standing   Upper Body Bathing: Set up;Sitting   Lower Body Bathing: Minimal assistance;Sit to/from stand Lower Body Bathing Details (indicate cue type and reason): min assist to reach lower legs and feet. Pt unable to cross legs up at this time. Upper Body Dressing : Set up;Sitting;Cueing for sequencing Upper Body Dressing Details (indicate cue type and reason): cuing only for the brace Lower Body Dressing: Moderate assistance;Sit to/from stand;Cueing for back precautions Lower Body Dressing Details (indicate cue type and reason): Pt unable to cross legs or prop lets on bed yet to donn socks and shoes but is very close. Husband is available to assist at home so pt not interested in adaptive equipment. Toilet Transfer: Supervision/safety;Ambulation;Regular Toilet;BSC;RW Toilet Transfer Details (indicate cue type and reason): pt walked to bathroom with S. Toileting- Clothing Manipulation and Hygiene: Supervision/safety;Sit to/from stand       Functional mobility during ADLs: Supervision/safety;Rolling walker General ADL Comments: Pt doing well with most adls. Pt needs assist with LE adls at this time but husband to assist.  No need for AE.     Vision Vision Assessment?: No apparent visual deficits   Perception     Praxis      Pertinent Vitals/Pain Pain Assessment: 0-10 Pain Score: 3  Faces Pain Scale: Hurts little more Pain Location: low back Pain Descriptors / Indicators: Aching;Operative site guarding  Pain Intervention(s): Limited activity within patient's tolerance;Premedicated before session;Monitored during session;Repositioned     Hand Dominance Right   Extremity/Trunk Assessment Upper Extremity  Assessment Upper Extremity Assessment: Overall WFL for tasks assessed   Lower Extremity Assessment Lower Extremity Assessment: Defer to PT evaluation   Cervical / Trunk Assessment Cervical / Trunk Assessment: Other exceptions Cervical / Trunk Exceptions: Lumbar incision site - previous cervical surgery.   Communication Communication Communication: No difficulties   Cognition Arousal/Alertness: Awake/alert Behavior During Therapy: WFL for tasks assessed/performed Overall Cognitive Status: Within Functional Limits for tasks assessed                     General Comments       Exercises       Shoulder Instructions      Home Living Family/patient expects to be discharged to:: Private residence Living Arrangements: Spouse/significant other Available Help at Discharge: Family;Available PRN/intermittently Type of Home: House Home Access: Stairs to enter [employer] of Steps: 2 Entrance Stairs-Rails: Right Home Layout: Two level;Bed/bath upstairs;Able to live on main level with bedroom/bathroom Alternate Level Stairs-Number of Steps: 16 Alternate Level Stairs-Rails: Right;Left Bathroom Shower/Tub: Tub/shower unit Shower/tub characteristics: Architectural technologist: Standard Bathroom Accessibility: Yes   Home Equipment: Environmental consultant - 2 wheels;Cane - single point;Bedside commode   Additional Comments: Pt has pets.  Educated pt on back precautions in regards to pets.      Prior Functioning/Environment Level of Independence: Independent        Comments: Her 73 year old grand daughter normally lives with her and she cares for her.  For next few months, she will live elsewhere but is coming back to live with her.     OT Diagnosis: Generalized weakness;Acute pain   OT Problem List: Impaired balance (sitting and/or standing);Decreased knowledge of use of DME or AE;Decreased knowledge of precautions;Pain   OT Treatment/Interventions: Self-care/ADL  training;Therapeutic activities;DME and/or AE instruction    OT Goals(Current goals can be found in the care plan section) Acute Rehab OT Goals Patient Stated Goal: to go home soon OT Goal Formulation: With patient Time For Goal Achievement: 01/13/16 Potential to Achieve Goals: Good ADL Goals Pt Will Perform Tub/Shower Transfer: Tub transfer;3 in 1;rolling walker;with supervision  OT Frequency: Min 2X/week   Barriers to D/C:            Co-evaluation              End of Session Equipment Utilized During Treatment: Rolling walker;Back brace Nurse Communication: Mobility status  Activity Tolerance: Patient tolerated treatment well Patient left: in bed;with call bell/phone within reach   Time: 1204-1240 OT Time Calculation (min): 36 min Charges:  OT General Charges $OT Visit: 1 Procedure OT Evaluation $OT Eval Moderate Complexity: 1 Procedure OT Treatments $Self Care/Home Management : 8-22 mins G-Codes:    Glenford Peers 2016/01/13, 12:48 PM  [phone]

## 2016-01-06 NOTE — Evaluation (Signed)
Physical Therapy Evaluation Patient Details Name: Julie Obrien MRN: [id number] DOB: 1951/02/16 Today's Date: 01/06/2016   History of Present Illness  Pt is a 65 y/o female who presents s/p L4-S1 PLIF on 01/05/16.  Clinical Impression  Pt admitted with above diagnosis. Pt currently with functional limitations due to the deficits listed below (see PT Problem List). At the time of PT eval pt was able to perform transfers and ambulation with min guard to supervision for safety. Pt reports to this therapist that husband works part time and she will be relying on neighbors for assistance at times. Pt will benefit from skilled PT to increase their independence and safety with mobility to allow discharge to the venue listed below.       Follow Up Recommendations Outpatient PT;Supervision for mobility/OOB    Equipment Recommendations  None recommended by PT    Recommendations for Other Services       Precautions / Restrictions Precautions Precautions: Fall;Back Precaution Booklet Issued: Yes (comment) Precaution Comments: Reviewed handout and pt was cued for precautions during functional mobility.  Required Braces or Orthoses: Spinal Brace Spinal Brace: Lumbar corset;Applied in sitting position Restrictions Weight Bearing Restrictions: No      Mobility  Bed Mobility Overal bed mobility: Needs Assistance Bed Mobility: Rolling;Sidelying to Sit Rolling: Supervision Sidelying to sit: Supervision       General bed mobility comments: Pt used bedrails but did not need hands on assist.  Transfers Overall transfer level: Needs assistance Equipment used: Rolling walker (2 wheeled) Transfers: Sit to/from Stand Sit to Stand: Min guard         General transfer comment: Multiple cues required for correct hand placement on walker.  Ambulation/Gait Ambulation/Gait assistance: Min guard;Supervision Ambulation Distance (Feet): 120 Feet Assistive device: Rolling walker (2  wheeled) Gait Pattern/deviations: Step-through pattern;Decreased stride length;Trunk flexed Gait velocity: Decreased Gait velocity interpretation: Below normal speed for age/gender General Gait Details: Very slow and guarded. Min guard required initially however progressed to close supervision by end of gait training.   Stairs            Wheelchair Mobility    Modified Rankin (Stroke Patients Only)       Balance Overall balance assessment: Needs assistance Sitting-balance support: Feet supported Sitting balance-Leahy Scale: Good     Standing balance support: Bilateral upper extremity supported;During functional activity Standing balance-Leahy Scale: Fair Standing balance comment: Feel pt needs walker at this time when standing to remain safe.                             Pertinent Vitals/Pain Pain Assessment: 0-10 Pain Score: 3  Faces Pain Scale: Hurts little more Pain Location: low back Pain Descriptors / Indicators: Aching;Operative site guarding Pain Intervention(s): Limited activity within patient's tolerance;Premedicated before session;Monitored during session;Repositioned    Home Living Family/patient expects to be discharged to:: Private residence Living Arrangements: Spouse/significant other Available Help at Discharge: Family;Available PRN/intermittently Type of Home: House Home Access: Stairs to enter Entrance Stairs-Rails: Right Entrance Stairs-Number of Steps: 2 Home Layout: Two level;Bed/bath upstairs;Able to live on main level with bedroom/bathroom Home Equipment: Gilford Rile - 2 wheels;Cane - single point;Bedside commode Additional Comments: Pt has pets.  Educated pt on back precautions in regards to pets.    Prior Function Level of Independence: Independent         Comments: Her 4 year old grand daughter normally lives with her and she cares for her.  For  next few months, she will live elsewhere but is coming back to live with her.       Hand Dominance   Dominant Hand: Right    Extremity/Trunk Assessment   Upper Extremity Assessment: Overall WFL for tasks assessed           Lower Extremity Assessment: Defer to PT evaluation      Cervical / Trunk Assessment: Other exceptions  Communication   Communication: No difficulties  Cognition Arousal/Alertness: Awake/alert Behavior During Therapy: WFL for tasks assessed/performed Overall Cognitive Status: Within Functional Limits for tasks assessed                      General Comments General comments (skin integrity, edema, etc.): Pt very motivated and familiar with the routine of back surgery recovery.    Exercises        Assessment/Plan    PT Assessment Patient needs continued PT services  PT Diagnosis Difficulty walking;Acute pain   PT Problem List Decreased strength;Decreased range of motion;Decreased activity tolerance;Decreased balance;Decreased mobility;Decreased knowledge of use of DME;Decreased safety awareness;Decreased knowledge of precautions;Pain  PT Treatment Interventions DME instruction;Gait training;Stair training;Functional mobility training;Therapeutic activities;Therapeutic exercise;Neuromuscular re-education;Patient/family education   PT Goals (Current goals can be found in the Care Plan section) Acute Rehab PT Goals Patient Stated Goal: to go home soon PT Goal Formulation: With patient Time For Goal Achievement: 01/13/16 Potential to Achieve Goals: Good    Frequency Min 5X/week   Barriers to discharge Decreased caregiver support Husband works typically 11-8. Will be there to help her get ready for the day before he leaves for work but then pt will be relying on neighbors for assist.     Co-evaluation               End of Session Equipment Utilized During Treatment: Back brace Activity Tolerance: Patient tolerated treatment well Patient left: in chair;with call bell/phone within reach Nurse Communication:  Mobility status         Time: [id number] PT Time Calculation (min) (ACUTE ONLY): 24 min   Charges:   PT Evaluation $PT Eval Moderate Complexity: 1 Procedure PT Treatments $Gait Training: 8-22 mins   PT G Codes:        Rolinda Roan February 02, 2016, 12:53 PM   Rolinda Roan, PT, DPT Acute Rehabilitation Services Pager: [phone]

## 2016-01-06 NOTE — Progress Notes (Signed)
Subjective: Patient reports Well no radicular pain back pain controlled condition of right hip pain  Objective: Vital signs in last 24 hours: Temp:  [97 F (36.1 C)-98.8 F (37.1 C)] 98.7 F (37.1 C) (05/25 0400) Pulse Rate:  [56-86] 86 (05/25 0400) Resp:  [11-45] 18 (05/25 0400) BP: (89-111)/(44-89) 104/51 mmHg (05/25 0400) SpO2:  [95 %-98 %] 95 % (05/25 0400)  Intake/Output from previous day: 05/24 0701 - 05/25 0700 In: 1860 [P.O.:60; I.V.:1800] Out: 1785 [Urine:1275; Drains:260; Blood:250] Intake/Output this shift:    Strength out of 5 wound clean dry and intact  Lab Results: No results for input(s): WBC, HGB, HCT, PLT in the last 72 hours. BMET No results for input(s): NA, K, CL, CO2, GLUCOSE, BUN, CREATININE, CALCIUM in the last 72 hours.  Studies/Results: Dg Lumbar Spine 2-3 Views  01/05/2016  CLINICAL DATA:  PLIF L2-4.  Removal of hardware L4-S1. EXAM: DG C-ARM 61-120 MIN; LUMBAR SPINE - 2-3 VIEW COMPARISON:  04/26/2015 lumbar spine MRI. FINDINGS: Fluoroscopy time 0 minutes 37 seconds. Two nondiagnostic spot fluoroscopic intraoperative radiographs of the lumbar spine demonstrate bilateral posterior spinal fusion hardware presumably from L2-L4 (lumbar levels are not well evaluated on these limited views). Bone cages are seen in the L2-3 and L3-4 disc spaces. Prior bone cages are seen in the L4-5 and L5-S1 disc spaces. IMPRESSION: Intraoperative fluoroscopic guidance for L2-L4 PLIF. Electronically Signed   By: Ilona Sorrel M.D.   On: 01/05/2016 12:33   Dg C-arm 1-60 Min  01/05/2016  CLINICAL DATA:  PLIF L2-4.  Removal of hardware L4-S1. EXAM: DG C-ARM 61-120 MIN; LUMBAR SPINE - 2-3 VIEW COMPARISON:  04/26/2015 lumbar spine MRI. FINDINGS: Fluoroscopy time 0 minutes 37 seconds. Two nondiagnostic spot fluoroscopic intraoperative radiographs of the lumbar spine demonstrate bilateral posterior spinal fusion hardware presumably from L2-L4 (lumbar levels are not well evaluated on these  limited views). Bone cages are seen in the L2-3 and L3-4 disc spaces. Prior bone cages are seen in the L4-5 and L5-S1 disc spaces. IMPRESSION: Intraoperative fluoroscopic guidance for L2-L4 PLIF. Electronically Signed   By: Ilona Sorrel M.D.   On: 01/05/2016 12:33    Assessment/Plan: Postop day 1 from a L2-3 L3-4 fusion doing very well with complete resolution of her preoperative radicular symptoms minimal incisional pain and a lot of pain around that right hip around the greater trochanteric bursa. We'll give her a couple doses of IV Decadron treat this as bursitis this pain began after walking and it does not start in her back and radiates to her hip. Progress with physical and occupational therapy  LOS: 1 day     Julie Obrien P 01/06/2016, 7:30 AM

## 2016-01-07 MED ORDER — OXYCODONE-ACETAMINOPHEN 5-325 MG PO TABS: 1.0000 | ORAL_TABLET | ORAL | Status: DC | PRN

## 2016-01-07 NOTE — Discharge Summary (Signed)
  Physician Discharge Summary  Patient ID: Julie Obrien MRN: [id number] DOB/AGE: October 08, 1950 65 y.o.  Admit date: 01/05/2016 Discharge date: 01/07/2016  Admission Diagnoses:Lumbar spinal stenosis herniated nucleus pulposis L2-3 L3-4  Discharge Diagnoses: Same Active Problems:   Spinal stenosis of lumbar region   Discharged Condition: fair  Hospital Course: Patient is admitted underwent decompressive laminectomy and fusion L2-3 L3-4 with removal of hardware L4-S1 postoperatively patient did very well with recovered in the floor on the floor was angling and voiding spontaneous he tolerating regular diet stable for discharge home with significant leg improved radicular pain and back pain.  Consults: Significant Diagnostic Studies: Treatments: L2-L4 posterior lumbar interbody fusion Discharge Exam: Blood pressure 104/46, pulse 71, temperature 98.2 F (36.8 C), temperature source Oral, resp. rate 18, height 5\' 4"  (1.626 m), weight 84.369 kg (186 lb), SpO2 94 %. Strength out of 5 wound clean dry and intact  Disposition: Home     Medication List    TAKE these medications        azithromycin 250 MG tablet  Commonly known as:  ZITHROMAX Z-PAK  2 po day one, then 1 daily x 4 days     benzonatate 100 MG capsule  Commonly known as:  TESSALON  Take 2 capsules (200 mg total) by mouth 2 (two) times daily as needed for cough.     cetirizine 10 MG tablet  Commonly known as:  ZYRTEC  Take 10 mg by mouth daily as needed for allergies.     cyclobenzaprine 10 MG tablet  Commonly known as:  FLEXERIL  Take 10 mg by mouth 3 (three) times daily as needed for muscle spasms.     dextromethorphan-guaiFENesin 30-600 MG 12hr tablet  Commonly known as:  MUCINEX DM  Take 1 tablet by mouth 2 (two) times daily as needed for cough.     ibuprofen 600 MG tablet  Commonly known as:  ADVIL,MOTRIN  Take 1 tablet (600 mg total) by mouth every 6 (six) hours as needed.     oxyCODONE-acetaminophen  5-325 MG tablet  Commonly known as:  PERCOCET/ROXICET  Take 1-2 tablets by mouth every 4 (four) hours as needed for moderate pain.     oxyCODONE-acetaminophen 5-325 MG tablet  Commonly known as:  PERCOCET/ROXICET  Take 1-2 tablets by mouth every 4 (four) hours as needed for moderate pain.     TYLENOL COLD MULTI-SYMPTOM 5-10-200-325 MG Tabs  Generic drug:  Phenylephrine-DM-GG-APAP  Take 2 capsules by mouth daily as needed (for cold).           Follow-up Information    Follow up with [facility] P, MD.   Specialty:  Neurosurgery   Contact information:   [address] [place] [address] [phone]       Signed: Elaina Hoops 01/07/2016, 7:14 AM

## 2016-01-07 NOTE — Discharge Instructions (Signed)

## 2016-01-07 NOTE — Progress Notes (Signed)
Occupational Therapy Treatment Patient Details Name: Julie Obrien MRN: [id number] DOB: Jan 25, 1951 Today's Date: 01/07/2016    History of present illness Pt is a 65 y/o female who presents s/p L4-S1 PLIF on 01/05/16.   OT comments  Pt. Able to complete bed mobility with mod I.  Demonstrated tub transfer with S.  Moving well, reports pain is well managed.  D/c set for later today  Follow Up Recommendations  No OT follow up;Supervision/Assistance - 24 hour    Equipment Recommendations  None recommended by OT    Recommendations for Other Services      Precautions / Restrictions Precautions Precautions: Fall;Back Precaution Comments: pt. able to demonstrate precautions without cueing during functional mobility Required Braces or Orthoses: Spinal Brace Spinal Brace: Lumbar corset;Applied in sitting position       Mobility Bed Mobility Overal bed mobility: Modified Independent Bed Mobility: Rolling;Sidelying to Sit Rolling: Modified independent (Device/Increase time) Sidelying to sit: Modified independent (Device/Increase time)       General bed mobility comments: hob flat, no rail, exited on left side  Transfers Overall transfer level: Needs assistance Equipment used: Rolling walker (2 wheeled) Transfers: Sit to/from [facility] Sit to Stand: Supervision Stand pivot transfers: Supervision       General transfer comment: cues for hand placement and to reach back before sitting down    Balance                                   ADL Overall ADL's : Needs assistance/impaired     Grooming: Wash/dry hands;Wash/dry face;Supervision/safety;Standing           Upper Body Dressing : Set up;Sitting Upper Body Dressing Details (indicate cue type and reason): don brace     Toilet Transfer: Supervision/safety;Ambulation;RW Toilet Transfer Details (indicate cue type and reason): 3n1 over the commode Toileting- Clothing Manipulation and  Hygiene: Supervision/safety;Sit to/from stand   Tub/ Shower Transfer: Tub transfer;Supervision/safety;Rolling walker;3 in 1 Tub/Shower Transfer Details (indicate cue type and reason): pt. able to demonstrate simulated tub transfer stepping over tub for R faucet, plans on using 3n1 in tub as shower seat Functional mobility during ADLs: Supervision/safety;Rolling walker General ADL Comments: Pt doing well with most adls. Pt needs assist with LE adls at this time but husband to assist.  No need for AE.      Vision                     Perception     Praxis      Cognition   Behavior During Therapy: WFL for tasks assessed/performed Overall Cognitive Status: Within Functional Limits for tasks assessed                       Extremity/Trunk Assessment               Exercises     Shoulder Instructions       General Comments      Pertinent Vitals/ Pain       Pain Assessment: 0-10 Pain Score: 4  Pain Location: low back Pain Descriptors / Indicators: Aching Pain Intervention(s): Monitored during session  Home Living                                          Prior  Functioning/Environment              Frequency Min 2X/week     Progress Toward Goals  OT Goals(current goals can now be found in the care plan section)  Progress towards OT goals: Progressing toward goals     Plan Discharge plan remains appropriate    Co-evaluation                 End of Session Equipment Utilized During Treatment: Gait belt;Rolling walker;Back brace   Activity Tolerance Patient tolerated treatment well   Patient Left in bed;with call bell/phone within reach   Nurse Communication          Time: 0700-0719 OT Time Calculation (min): 19 min  Charges: OT General Charges $OT Visit: 1 Procedure OT Treatments $Self Care/Home Management : 8-22 mins  Janice Coffin, COTA/L 01/07/2016, 7:34 AM

## 2016-01-07 NOTE — Anesthesia Postprocedure Evaluation (Signed)
Anesthesia Post Note  Patient: TARRIE ZALUSKI  Procedure(s) Performed: Procedure(s) (LRB): Posterior Lumbar Interbody Fusion  Lumbar two-three, lumbar three-four , Removal hardware Lumbar four to sacral one (N/A)  Patient location during evaluation: PACU Anesthesia Type: General Level of consciousness: awake and alert Pain management: pain level controlled Vital Signs Assessment: post-procedure vital signs reviewed and stable Respiratory status: spontaneous breathing, nonlabored ventilation, respiratory function stable and patient connected to nasal cannula oxygen Cardiovascular status: blood pressure returned to baseline and stable Postop Assessment: no signs of nausea or vomiting Anesthetic complications: no    Last Vitals:  Filed Vitals:   01/06/16 2351 01/07/16 0449  BP: 114/55 104/46  Pulse: 70 71  Temp: 36.7 C 36.8 C  Resp: 18 18    Last Pain:  Filed Vitals:   01/07/16 0610  PainSc: 3                  Catalina Gravel

## 2016-01-07 NOTE — Progress Notes (Signed)
Pt doing well. Pt and spouse given D/C instructions with Rx, verbal understanding was provided. Pt's IV was removed prior to D/C. Pt's incision is open to air and has no sign of infection. Pt D/C'd home via wheelchair. Pt is stable @ D/C and has no other needs at this time. 

## 2016-01-07 NOTE — Progress Notes (Signed)
Patient ID: Julie Obrien, female   DOB: February 27, 1951, 65 y.o.   MRN: [id number] Doing great pain well-controlled  Strength out of 5 wound clean dry and intact  Discharge home

## 2016-01-07 NOTE — Progress Notes (Signed)
Physical Therapy Treatment Patient Details Name: Julie Obrien MRN: [id number] DOB: [birth year] Today's Date: 01/07/2016    History of Present Illness Pt is a 65 y/o female who presents s/p L4-S1 PLIF on 01/05/16.    PT Comments    Pt progressing towards physical therapy goals. Was able to perform transfers and ambulation with gross supervision, and occasional min guard assist for safety. Pt completed stair training and was able to negotiate 10 stairs utilizing sideways technique on R railing. Pt did not require assistance, however min guard was provided for safety. Will continue to follow and progress as able per POC.   Follow Up Recommendations  Outpatient PT;Supervision for mobility/OOB     Equipment Recommendations  None recommended by PT    Recommendations for Other Services       Precautions / Restrictions Precautions Precautions: Fall;Back Precaution Booklet Issued: Yes (comment) Precaution Comments: Reviewed handout and pt was cued for precautions during functional mobility.  Required Braces or Orthoses: Spinal Brace Spinal Brace: Lumbar corset;Applied in sitting position Restrictions Weight Bearing Restrictions: No    Mobility  Bed Mobility               General bed mobility comments: Pt sitting up in recliner upon PT arrival.   Transfers Overall transfer level: Needs assistance Equipment used: Rolling walker (2 wheeled) Transfers: Sit to/from Stand Sit to Stand: Supervision         General transfer comment: Gross supervision for safety. Pt was able to power-up to full standing without assist or any noted unsteadiness.   Ambulation/Gait Ambulation/Gait assistance: Supervision Ambulation Distance (Feet): 150 Feet Assistive device: Rolling walker (2 wheeled) Gait Pattern/deviations: Step-through pattern;Decreased stride length;Trunk flexed Gait velocity: Decreased Gait velocity interpretation: Below normal speed for age/gender General Gait  Details: VC's for improved posture and general safety during ambulation.    Stairs            Wheelchair Mobility    Modified Rankin (Stroke Patients Only)       Balance Overall balance assessment: Needs assistance Sitting-balance support: Feet supported;No upper extremity supported Sitting balance-Leahy Scale: Good     Standing balance support: No upper extremity supported;During functional activity Standing balance-Leahy Scale: Fair Standing balance comment: Statically. Requires RW for dynamic balance activity.                     Cognition Arousal/Alertness: Awake/alert Behavior During Therapy: WFL for tasks assessed/performed Overall Cognitive Status: Within Functional Limits for tasks assessed                      Exercises      General Comments        Pertinent Vitals/Pain Pain Assessment: Faces Faces Pain Scale: Hurts little more Pain Location: thighs aching Pain Descriptors / Indicators: Aching Pain Intervention(s): Limited activity within patient's tolerance;Monitored during session;Repositioned    Home Living                      Prior Function            PT Goals (current goals can now be found in the care plan section) Acute Rehab PT Goals Patient Stated Goal: Home at d/c.  PT Goal Formulation: With patient Time For Goal Achievement: 01/13/16 Potential to Achieve Goals: Good Progress towards PT goals: Progressing toward goals    Frequency  Min 5X/week    PT Plan Current plan remains appropriate    Co-evaluation  End of Session Equipment Utilized During Treatment: Back brace Activity Tolerance: Patient tolerated treatment well Patient left: in chair;with call bell/phone within reach     Time: 0745-0807 PT Time Calculation (min) (ACUTE ONLY): 22 min  Charges:  $Gait Training: 8-22 mins                    G Codes:      Rolinda Roan 02-04-2016, 12:20 PM   Rolinda Roan, PT,  DPT Acute Rehabilitation Services Pager: [phone]

## 2016-01-27 ENCOUNTER — Other Ambulatory Visit: Payer: Self-pay | Admitting: Neurosurgery

## 2016-01-27 DIAGNOSIS — M4807 Spinal stenosis, lumbosacral region: Secondary | ICD-10-CM

## 2016-01-28 ENCOUNTER — Ambulatory Visit
Admission: RE | Admit: 2016-01-28 | Discharge: 2016-01-28 | Disposition: A | Discharge status: Home / Self Care | Payer: Medicare Other | Source: Ambulatory Visit | Attending: Neurosurgery | Admitting: Neurosurgery

## 2016-01-28 DIAGNOSIS — M4807 Spinal stenosis, lumbosacral region: Secondary | ICD-10-CM

## 2016-01-28 DIAGNOSIS — M4806 Spinal stenosis, lumbar region: Secondary | ICD-10-CM | POA: Diagnosis not present

## 2016-02-07 ENCOUNTER — Other Ambulatory Visit: Payer: Medicare Other

## 2016-03-14 ENCOUNTER — Other Ambulatory Visit: Payer: Self-pay | Admitting: Neurosurgery

## 2016-03-14 DIAGNOSIS — M5137 Other intervertebral disc degeneration, lumbosacral region: Secondary | ICD-10-CM | POA: Diagnosis not present

## 2016-03-14 DIAGNOSIS — Z6832 Body mass index (BMI) 32.0-32.9, adult: Secondary | ICD-10-CM | POA: Diagnosis not present

## 2016-03-14 DIAGNOSIS — M5021 Other cervical disc displacement,  high cervical region: Secondary | ICD-10-CM | POA: Diagnosis not present

## 2016-03-14 DIAGNOSIS — R930 Abnormal findings on diagnostic imaging of skull and head, not elsewhere classified: Secondary | ICD-10-CM

## 2016-05-09 ENCOUNTER — Encounter: Payer: Self-pay | Admitting: *Deleted

## 2016-05-09 ENCOUNTER — Ambulatory Visit
Admission: RE | Admit: 2016-05-09 | Discharge: 2016-05-09 | Disposition: A | Discharge status: Home / Self Care | Payer: Medicare Other | Source: Ambulatory Visit | Attending: Neurosurgery | Admitting: Neurosurgery

## 2016-05-09 DIAGNOSIS — R930 Abnormal findings on diagnostic imaging of skull and head, not elsewhere classified: Secondary | ICD-10-CM

## 2016-05-09 DIAGNOSIS — E236 Other disorders of pituitary gland: Secondary | ICD-10-CM | POA: Diagnosis not present

## 2016-05-09 MED ORDER — GADOBENATE DIMEGLUMINE 529 MG/ML IV SOLN
10.0000 mL | Freq: Once | INTRAVENOUS | Status: AC | PRN
  Administered 2016-05-09: 10 mL via INTRAVENOUS

## 2016-05-16 DIAGNOSIS — R030 Elevated blood-pressure reading, without diagnosis of hypertension: Secondary | ICD-10-CM | POA: Diagnosis not present

## 2016-05-16 DIAGNOSIS — R930 Abnormal findings on diagnostic imaging of skull and head, not elsewhere classified: Secondary | ICD-10-CM | POA: Diagnosis not present

## 2016-05-16 DIAGNOSIS — M5137 Other intervertebral disc degeneration, lumbosacral region: Secondary | ICD-10-CM | POA: Diagnosis not present

## 2016-05-16 DIAGNOSIS — M5021 Other cervical disc displacement,  high cervical region: Secondary | ICD-10-CM | POA: Diagnosis not present

## 2016-05-16 DIAGNOSIS — Z6831 Body mass index (BMI) 31.0-31.9, adult: Secondary | ICD-10-CM | POA: Diagnosis not present

## 2016-05-16 DIAGNOSIS — M4807 Spinal stenosis, lumbosacral region: Secondary | ICD-10-CM | POA: Diagnosis not present

## 2016-08-29 DIAGNOSIS — M542 Cervicalgia: Secondary | ICD-10-CM | POA: Diagnosis not present

## 2016-08-29 DIAGNOSIS — M5021 Other cervical disc displacement,  high cervical region: Secondary | ICD-10-CM | POA: Diagnosis not present

## 2016-08-29 DIAGNOSIS — M4807 Spinal stenosis, lumbosacral region: Secondary | ICD-10-CM | POA: Diagnosis not present

## 2016-09-01 ENCOUNTER — Other Ambulatory Visit: Payer: Self-pay | Admitting: Neurosurgery

## 2016-09-01 DIAGNOSIS — M542 Cervicalgia: Secondary | ICD-10-CM

## 2016-09-04 DIAGNOSIS — Z1231 Encounter for screening mammogram for malignant neoplasm of breast: Secondary | ICD-10-CM | POA: Diagnosis not present

## 2016-09-04 DIAGNOSIS — Z803 Family history of malignant neoplasm of breast: Secondary | ICD-10-CM | POA: Diagnosis not present

## 2016-09-07 ENCOUNTER — Ambulatory Visit
Admission: RE | Admit: 2016-09-07 | Discharge: 2016-09-07 | Disposition: A | Discharge status: Home / Self Care | Payer: Medicare Other | Source: Ambulatory Visit | Attending: Neurosurgery | Admitting: Neurosurgery

## 2016-09-07 DIAGNOSIS — M4802 Spinal stenosis, cervical region: Secondary | ICD-10-CM | POA: Diagnosis not present

## 2016-09-07 DIAGNOSIS — M542 Cervicalgia: Secondary | ICD-10-CM

## 2016-09-07 DIAGNOSIS — M5023 Other cervical disc displacement, cervicothoracic region: Secondary | ICD-10-CM | POA: Diagnosis not present

## 2016-11-22 DIAGNOSIS — G5603 Carpal tunnel syndrome, bilateral upper limbs: Secondary | ICD-10-CM | POA: Diagnosis not present

## 2016-11-24 DIAGNOSIS — M542 Cervicalgia: Secondary | ICD-10-CM | POA: Diagnosis not present

## 2016-11-24 DIAGNOSIS — M5440 Lumbago with sciatica, unspecified side: Secondary | ICD-10-CM | POA: Diagnosis not present

## 2016-11-24 DIAGNOSIS — M5137 Other intervertebral disc degeneration, lumbosacral region: Secondary | ICD-10-CM | POA: Diagnosis not present

## 2016-12-11 DIAGNOSIS — M5137 Other intervertebral disc degeneration, lumbosacral region: Secondary | ICD-10-CM | POA: Diagnosis not present

## 2016-12-11 DIAGNOSIS — M542 Cervicalgia: Secondary | ICD-10-CM | POA: Diagnosis not present

## 2017-01-02 DIAGNOSIS — M542 Cervicalgia: Secondary | ICD-10-CM | POA: Diagnosis not present

## 2017-01-02 DIAGNOSIS — Z6831 Body mass index (BMI) 31.0-31.9, adult: Secondary | ICD-10-CM | POA: Diagnosis not present

## 2017-01-02 DIAGNOSIS — I10 Essential (primary) hypertension: Secondary | ICD-10-CM | POA: Diagnosis not present

## 2017-01-16 DIAGNOSIS — J189 Pneumonia, unspecified organism: Secondary | ICD-10-CM | POA: Diagnosis not present

## 2017-02-13 DIAGNOSIS — Z800 Family history of malignant neoplasm of digestive organs: Secondary | ICD-10-CM | POA: Diagnosis not present

## 2017-02-13 DIAGNOSIS — Z1211 Encounter for screening for malignant neoplasm of colon: Secondary | ICD-10-CM | POA: Diagnosis not present

## 2017-02-22 DIAGNOSIS — H2513 Age-related nuclear cataract, bilateral: Secondary | ICD-10-CM | POA: Diagnosis not present

## 2017-02-26 ENCOUNTER — Other Ambulatory Visit: Payer: Self-pay | Admitting: Neurosurgery

## 2017-02-26 DIAGNOSIS — M5412 Radiculopathy, cervical region: Secondary | ICD-10-CM | POA: Diagnosis not present

## 2017-02-26 DIAGNOSIS — M5440 Lumbago with sciatica, unspecified side: Secondary | ICD-10-CM | POA: Diagnosis not present

## 2017-02-26 DIAGNOSIS — M542 Cervicalgia: Secondary | ICD-10-CM | POA: Diagnosis not present

## 2017-02-27 ENCOUNTER — Other Ambulatory Visit: Payer: Self-pay | Admitting: Neurosurgery

## 2017-02-27 DIAGNOSIS — M542 Cervicalgia: Secondary | ICD-10-CM | POA: Diagnosis not present

## 2017-02-27 DIAGNOSIS — E2839 Other primary ovarian failure: Secondary | ICD-10-CM | POA: Diagnosis not present

## 2017-02-27 DIAGNOSIS — Z79899 Other long term (current) drug therapy: Secondary | ICD-10-CM | POA: Diagnosis not present

## 2017-02-27 DIAGNOSIS — Z0000 Encounter for general adult medical examination without abnormal findings: Secondary | ICD-10-CM | POA: Diagnosis not present

## 2017-02-27 DIAGNOSIS — M5440 Lumbago with sciatica, unspecified side: Secondary | ICD-10-CM

## 2017-02-27 DIAGNOSIS — R7309 Other abnormal glucose: Secondary | ICD-10-CM | POA: Diagnosis not present

## 2017-02-27 DIAGNOSIS — E559 Vitamin D deficiency, unspecified: Secondary | ICD-10-CM | POA: Diagnosis not present

## 2017-02-27 DIAGNOSIS — Z1389 Encounter for screening for other disorder: Secondary | ICD-10-CM | POA: Diagnosis not present

## 2017-02-28 ENCOUNTER — Ambulatory Visit
Admission: RE | Admit: 2017-02-28 | Discharge: 2017-02-28 | Disposition: A | Discharge status: Home / Self Care | Payer: Medicare Other | Source: Ambulatory Visit | Attending: Neurosurgery | Admitting: Neurosurgery

## 2017-02-28 DIAGNOSIS — M5126 Other intervertebral disc displacement, lumbar region: Secondary | ICD-10-CM | POA: Diagnosis not present

## 2017-02-28 DIAGNOSIS — M4802 Spinal stenosis, cervical region: Secondary | ICD-10-CM | POA: Diagnosis not present

## 2017-02-28 DIAGNOSIS — M5440 Lumbago with sciatica, unspecified side: Secondary | ICD-10-CM

## 2017-02-28 DIAGNOSIS — M5412 Radiculopathy, cervical region: Secondary | ICD-10-CM

## 2017-03-06 DIAGNOSIS — Z780 Asymptomatic menopausal state: Secondary | ICD-10-CM | POA: Diagnosis not present

## 2017-03-12 ENCOUNTER — Other Ambulatory Visit: Payer: Medicare Other

## 2017-03-12 DIAGNOSIS — M5137 Other intervertebral disc degeneration, lumbosacral region: Secondary | ICD-10-CM | POA: Diagnosis not present

## 2017-03-12 DIAGNOSIS — R030 Elevated blood-pressure reading, without diagnosis of hypertension: Secondary | ICD-10-CM | POA: Diagnosis not present

## 2017-03-12 DIAGNOSIS — M542 Cervicalgia: Secondary | ICD-10-CM | POA: Diagnosis not present

## 2017-03-12 DIAGNOSIS — Z6831 Body mass index (BMI) 31.0-31.9, adult: Secondary | ICD-10-CM | POA: Diagnosis not present

## 2017-03-30 DIAGNOSIS — S76912A Strain of unspecified muscles, fascia and tendons at thigh level, left thigh, initial encounter: Secondary | ICD-10-CM | POA: Diagnosis not present

## 2017-04-17 DIAGNOSIS — M545 Low back pain: Secondary | ICD-10-CM | POA: Diagnosis not present

## 2017-04-17 DIAGNOSIS — M5441 Lumbago with sciatica, right side: Secondary | ICD-10-CM | POA: Diagnosis not present

## 2017-04-17 DIAGNOSIS — M7061 Trochanteric bursitis, right hip: Secondary | ICD-10-CM | POA: Diagnosis not present

## 2017-04-17 DIAGNOSIS — M7062 Trochanteric bursitis, left hip: Secondary | ICD-10-CM | POA: Diagnosis not present

## 2017-05-01 DIAGNOSIS — M25551 Pain in right hip: Secondary | ICD-10-CM | POA: Diagnosis not present

## 2017-05-01 DIAGNOSIS — M25552 Pain in left hip: Secondary | ICD-10-CM | POA: Diagnosis not present

## 2017-05-01 DIAGNOSIS — M7061 Trochanteric bursitis, right hip: Secondary | ICD-10-CM | POA: Diagnosis not present

## 2017-05-01 DIAGNOSIS — M7062 Trochanteric bursitis, left hip: Secondary | ICD-10-CM | POA: Diagnosis not present

## 2017-05-08 DIAGNOSIS — M25552 Pain in left hip: Secondary | ICD-10-CM | POA: Diagnosis not present

## 2017-05-08 DIAGNOSIS — M7062 Trochanteric bursitis, left hip: Secondary | ICD-10-CM | POA: Diagnosis not present

## 2017-05-08 DIAGNOSIS — M25551 Pain in right hip: Secondary | ICD-10-CM | POA: Diagnosis not present

## 2017-05-08 DIAGNOSIS — M7061 Trochanteric bursitis, right hip: Secondary | ICD-10-CM | POA: Diagnosis not present

## 2017-07-09 DIAGNOSIS — M5442 Lumbago with sciatica, left side: Secondary | ICD-10-CM | POA: Diagnosis not present

## 2017-07-09 DIAGNOSIS — M25552 Pain in left hip: Secondary | ICD-10-CM | POA: Diagnosis not present

## 2017-07-09 DIAGNOSIS — M545 Low back pain: Secondary | ICD-10-CM | POA: Diagnosis not present

## 2017-07-19 ENCOUNTER — Other Ambulatory Visit: Payer: Self-pay

## 2017-07-19 ENCOUNTER — Encounter (HOSPITAL_COMMUNITY): Payer: Self-pay

## 2017-07-19 DIAGNOSIS — Z79899 Other long term (current) drug therapy: Secondary | ICD-10-CM | POA: Insufficient documentation

## 2017-07-19 DIAGNOSIS — M25552 Pain in left hip: Secondary | ICD-10-CM | POA: Diagnosis not present

## 2017-07-19 DIAGNOSIS — R0602 Shortness of breath: Secondary | ICD-10-CM | POA: Diagnosis not present

## 2017-07-19 DIAGNOSIS — F17210 Nicotine dependence, cigarettes, uncomplicated: Secondary | ICD-10-CM | POA: Insufficient documentation

## 2017-07-19 DIAGNOSIS — J181 Lobar pneumonia, unspecified organism: Secondary | ICD-10-CM | POA: Insufficient documentation

## 2017-07-19 DIAGNOSIS — K828 Other specified diseases of gallbladder: Secondary | ICD-10-CM | POA: Insufficient documentation

## 2017-07-19 DIAGNOSIS — R7989 Other specified abnormal findings of blood chemistry: Secondary | ICD-10-CM | POA: Diagnosis not present

## 2017-07-19 DIAGNOSIS — R079 Chest pain, unspecified: Secondary | ICD-10-CM | POA: Diagnosis not present

## 2017-07-19 DIAGNOSIS — R1011 Right upper quadrant pain: Secondary | ICD-10-CM | POA: Diagnosis not present

## 2017-07-19 DIAGNOSIS — M545 Low back pain: Secondary | ICD-10-CM | POA: Diagnosis not present

## 2017-07-19 DIAGNOSIS — R109 Unspecified abdominal pain: Secondary | ICD-10-CM | POA: Diagnosis present

## 2017-07-19 DIAGNOSIS — M5432 Sciatica, left side: Secondary | ICD-10-CM | POA: Diagnosis not present

## 2017-07-19 DIAGNOSIS — J189 Pneumonia, unspecified organism: Secondary | ICD-10-CM | POA: Diagnosis not present

## 2017-07-19 DIAGNOSIS — M533 Sacrococcygeal disorders, not elsewhere classified: Secondary | ICD-10-CM | POA: Diagnosis not present

## 2017-07-19 DIAGNOSIS — K829 Disease of gallbladder, unspecified: Secondary | ICD-10-CM | POA: Diagnosis not present

## 2017-07-19 LAB — COMPREHENSIVE METABOLIC PANEL
ALK PHOS: 76 U/L (ref 38–126)
ALT: 16 U/L (ref 14–54)
AST: 21 U/L (ref 15–41)
Albumin: 4 g/dL (ref 3.5–5.0)
Anion gap: 10 (ref 5–15)
BUN: 9 mg/dL (ref 6–20)
CALCIUM: 9.6 mg/dL (ref 8.9–10.3)
CO2: 23 mmol/L (ref 22–32)
Chloride: 105 mmol/L (ref 101–111)
Creatinine, Ser: 0.85 mg/dL (ref 0.44–1.00)
GFR calc Af Amer: >60 mL/min (ref >60)
GFR calc non Af Amer: >60 mL/min (ref >60)
GLUCOSE: 156 mg/dL — AB (ref 65–99)
Potassium: 4.5 mmol/L (ref 3.5–5.1)
Sodium: 138 mmol/L (ref 135–145)
Total Bilirubin: 0.5 mg/dL (ref 0.3–1.2)
Total Protein: 7.1 g/dL (ref 6.5–8.1)

## 2017-07-19 LAB — URINALYSIS, ROUTINE W REFLEX MICROSCOPIC
BACTERIA UA: NONE SEEN
Bilirubin Urine: NEGATIVE
Glucose, UA: NEGATIVE mg/dL
Ketones, ur: NEGATIVE mg/dL
Leukocytes, UA: NEGATIVE
Nitrite: NEGATIVE
Protein, ur: NEGATIVE mg/dL
SPECIFIC GRAVITY, URINE: 1.004 — AB (ref 1.005–1.030)
SQUAMOUS EPITHELIAL / LPF: NONE SEEN
WBC, UA: NONE SEEN WBC/hpf (ref 0–5)
pH: 8 (ref 5.0–8.0)

## 2017-07-19 LAB — CBC
HEMATOCRIT: 41.9 % (ref 36.0–46.0)
Hemoglobin: 13.7 g/dL (ref 12.0–15.0)
MCH: 26.9 pg (ref 26.0–34.0)
MCHC: 32.7 g/dL (ref 30.0–36.0)
MCV: 82.3 fL (ref 78.0–100.0)
Platelets: 342 10*3/uL (ref 150–400)
RBC: 5.09 MIL/uL (ref 3.87–5.11)
RDW: 15.3 % (ref 11.5–15.5)
WBC: 7.2 10*3/uL (ref 4.0–10.5)

## 2017-07-19 LAB — LIPASE, BLOOD: LIPASE: 19 U/L (ref 11–51)

## 2017-07-19 NOTE — ED Triage Notes (Addendum)
Pt endorses RUQ pain radiating down into the right hip. Pt has hx of hip pain on same side and is being treated for pinched nerve on the left side and bursitis. Denies urinary sx, pain worse with movement. VSS. Denies n/v/d

## 2017-07-20 ENCOUNTER — Emergency Department (HOSPITAL_COMMUNITY): Payer: Medicare Other

## 2017-07-20 ENCOUNTER — Emergency Department (HOSPITAL_COMMUNITY)
Admission: EM | Admit: 2017-07-20 | Discharge: 2017-07-20 | Disposition: A | Discharge status: Home / Self Care | Payer: Medicare Other | Attending: Emergency Medicine | Admitting: Emergency Medicine

## 2017-07-20 DIAGNOSIS — R079 Chest pain, unspecified: Secondary | ICD-10-CM | POA: Diagnosis not present

## 2017-07-20 DIAGNOSIS — J181 Lobar pneumonia, unspecified organism: Secondary | ICD-10-CM

## 2017-07-20 DIAGNOSIS — K828 Other specified diseases of gallbladder: Secondary | ICD-10-CM

## 2017-07-20 DIAGNOSIS — R1011 Right upper quadrant pain: Secondary | ICD-10-CM | POA: Diagnosis not present

## 2017-07-20 DIAGNOSIS — K829 Disease of gallbladder, unspecified: Secondary | ICD-10-CM | POA: Diagnosis not present

## 2017-07-20 DIAGNOSIS — R7989 Other specified abnormal findings of blood chemistry: Secondary | ICD-10-CM | POA: Diagnosis not present

## 2017-07-20 DIAGNOSIS — R0602 Shortness of breath: Secondary | ICD-10-CM | POA: Diagnosis not present

## 2017-07-20 DIAGNOSIS — J189 Pneumonia, unspecified organism: Secondary | ICD-10-CM

## 2017-07-20 LAB — D-DIMER, QUANTITATIVE: D-Dimer, Quant: 0.52 ug/mL-FEU — ABNORMAL HIGH (ref 0.00–0.50)

## 2017-07-20 LAB — TROPONIN I: Troponin I: <0.03 ng/mL (ref <0.03)

## 2017-07-20 MED ORDER — LEVOFLOXACIN 750 MG PO TABS: 750.0000 mg | ORAL_TABLET | Freq: Every day | ORAL | 0 refills | Status: DC

## 2017-07-20 MED ORDER — HYDROCODONE-ACETAMINOPHEN 5-325 MG PO TABS
1.0000 | ORAL_TABLET | Freq: Once | ORAL | Status: AC
  Administered 2017-07-20: 1 via ORAL
  Filled 2017-07-20: qty 1

## 2017-07-20 MED ORDER — IOPAMIDOL (ISOVUE-370) INJECTION 76%
INTRAVENOUS | Status: AC
  Administered 2017-07-20: 100 mL
  Filled 2017-07-20: qty 100

## 2017-07-20 NOTE — ED Provider Notes (Signed)
[facility] EMERGENCY DEPARTMENT Provider Note   CSN: [id number] Arrival date & time: 07/19/17  2037     History   Chief Complaint Chief Complaint  Patient presents with  . Hip Pain  . Abdominal Pain    HPI Julie Obrien is a 66 y.o. female.  Patient presents with right-sided abdominal pain that started this evening around 6:30 PM.  This started while she was feeding her granddaughter.  Pain is constant.  Nothing makes it better or worse.  States she has had gallstones in the past.  Denies any nausea, vomiting, fever.  No dysuria hematuria.  Contrary to triage note patient has bursitis of her left hip for which she is on treatment for.  This is unchanged.  There is no new weakness, numbness or tingling.  No bowel or bladder incontinence.  Patient had a brief episode of chest pain prior to the start of this abdominal pain but that has resolved after lasting only a few seconds.  Denies any shortness of breath, cough or fever.  Denies any vaginal discharge or urinary symptoms.   The history is provided by the patient.    Past Medical History:  Diagnosis Date  . Arthritis    hnp, low back , knees - " all OA"  . Complication of anesthesia    nausea one surgery more recent  . History of kidney stones   . Pneumonia    Feb.- Mar. 2017, not hospitalized, but pending surgery was cancelled ,since then, pt. seen by PCP, told that it was cleared    . PONV (postoperative nausea and vomiting)     Patient Active Problem List   Diagnosis Date Noted  . Spinal stenosis of lumbar region 01/05/2016  . Thoracic spine fracture, closed, initial encounter 06/21/2015  . 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 BACK SURGERY  08  . CERVICAL DISCECTOMY  09  . CESAREAN SECTION     x4  . CYST EXCISION     vocal cords?  . CYSTO  99   stone  . HARDWARE REMOVAL N/A 06/21/2015   Procedure: Exploration of Fusion and  removal of hardware right thoracic level one;  Surgeon: Kary Kos, MD;  Location: [facility];  Service: Neurosurgery;  Laterality: N/A;  . POSTERIOR CERVICAL FUSION/FORAMINOTOMY N/A 05/31/2015   Procedure: Decompression and Laminectomy Cervical three-four  Cervical six-seven  Cervical seven-thoracic-one Posterior cervical fusion with lateral mass fixation;  Surgeon: Kary Kos, MD;  Location: [facility];  Service: Neurosurgery;  Laterality: N/A;  . TONSILLECTOMY    . TUBAL LIGATION      OB History    No data available       Home Medications    Prior to Admission medications   Medication Sig Start Date End Date Taking? Authorizing Provider  azithromycin (ZITHROMAX Z-PAK) 250 MG tablet 2 po day one, then 1 daily x 4 days 10/13/15   Margarita Mail, PA-C  benzonatate (TESSALON) 100 MG capsule Take 2 capsules (200 mg total) by mouth 2 (two) times daily as needed for cough. 10/13/15   Margarita Mail, PA-C  cetirizine (ZYRTEC) 10 MG tablet Take 10 mg by mouth daily as needed for allergies.    [provider]  cyclobenzaprine (FLEXERIL) 10 MG tablet Take 10 mg by mouth 3 (three) times daily as needed for muscle spasms.    [provider]  dextromethorphan-guaiFENesin (MUCINEX DM) 30-600 MG 12hr tablet Take  1 tablet by mouth 2 (two) times daily as needed for cough.    [provider]  ibuprofen (ADVIL,MOTRIN) 600 MG tablet Take 1 tablet (600 mg total) by mouth every 6 (six) hours as needed. 10/13/15   Margarita Mail, PA-C  oxyCODONE-acetaminophen (PERCOCET/ROXICET) 5-325 MG tablet Take 1-2 tablets by mouth every 4 (four) hours as needed for moderate pain. 06/02/15   Kary Kos, MD  oxyCODONE-acetaminophen (PERCOCET/ROXICET) 5-325 MG tablet Take 1-2 tablets by mouth every 4 (four) hours as needed for moderate pain. 01/07/16   Kary Kos, MD  Phenylephrine-DM-GG-APAP (TYLENOL COLD MULTI-SYMPTOM) 5-10-200-325 MG TABS Take 2 capsules by mouth daily as needed (for cold).     [provider]    Family History History reviewed. No pertinent family history.  Social History Social History   Tobacco Use  . Smoking status: Current Every Day Smoker    Packs/day: 0.50    Years: 10.00    Pack years: 5.00    Types: Cigarettes  Substance Use Topics  . Alcohol use: No  . Drug use: No     Allergies   Sulfa antibiotics   Review of Systems Review of Systems  Constitutional: Negative for activity change, appetite change and fever.  HENT: Negative for congestion and rhinorrhea.   Respiratory: Negative for cough, chest tightness and shortness of breath.   Cardiovascular: Negative for chest pain.  Gastrointestinal: Positive for abdominal pain. Negative for nausea and vomiting.  Genitourinary: Negative for vaginal bleeding and vaginal discharge.  Musculoskeletal: Positive for arthralgias, back pain and myalgias.  Skin: Negative for rash.  Neurological: Negative for dizziness, weakness and headaches.    all other systems are negative except as noted in the HPI and PMH.    Physical Exam Updated Vital Signs BP 120/68   Pulse 73   Temp (!) 97.5 F (36.4 C) (Oral)   Resp 14   Ht 5' 4.5" (1.638 m)   Wt 82.6 kg (182 lb)   SpO2 97%   BMI 30.76 kg/m   Physical Exam  Constitutional: She is oriented to person, place, and time. She appears well-developed and well-nourished. No distress.  HENT:  Head: Normocephalic and atraumatic.  Mouth/Throat: Oropharynx is clear and moist. No oropharyngeal exudate.  Eyes: Conjunctivae and EOM are normal. Pupils are equal, round, and reactive to light.  Neck: Normal range of motion. Neck supple.  No meningismus.  Cardiovascular: Normal rate, regular rhythm, normal heart sounds and intact distal pulses.  No murmur heard. Pulmonary/Chest: Effort normal and breath sounds normal. No respiratory distress.  Abdominal: Soft. There is tenderness. There is no rebound and no guarding.  TTP RUQ and epigastrium No  guarding or rebound. No rib pain  Musculoskeletal: Normal range of motion. She exhibits no edema or tenderness.  Well-healed lumbar incision. No CVA tenderness  5/5 strength in bilateral lower extremities. Ankle plantar and dorsiflexion intact. Great toe extension intact bilaterally. +2 DP and PT pulses. +2 patellar reflexes bilaterally. Normal gait.   Neurological: She is alert and oriented to person, place, and time. No cranial nerve deficit. She exhibits normal muscle tone. Coordination normal.   5/5 strength throughout. CN 2-12 intact.Equal grip strength.   Skin: Skin is warm.  Psychiatric: She has a normal mood and affect. Her behavior is normal.  Nursing note and vitals reviewed.    ED Treatments / Results  Labs (all labs ordered are listed, but only abnormal results are displayed) Labs Reviewed  COMPREHENSIVE METABOLIC PANEL - Abnormal; Notable for the following  components:      Result Value   Glucose, Bld 156 (*)    All other components within normal limits  URINALYSIS, ROUTINE W REFLEX MICROSCOPIC - Abnormal; Notable for the following components:   Color, Urine STRAW (*)    Specific Gravity, Urine 1.004 (*)    Hgb urine dipstick SMALL (*)    All other components within normal limits  D-DIMER, QUANTITATIVE (NOT AT [facility]) - Abnormal; Notable for the following components:   D-Dimer, Quant 0.52 (*)    All other components within normal limits  LIPASE, BLOOD  CBC  TROPONIN I    EKG  EKG Interpretation  Date/Time:  Friday July 20 2017 01:50:07 EST Ventricular Rate:  72 PR Interval:    QRS Duration: 84 QT Interval:  406 QTC Calculation: 445 R Axis:   -17 Text Interpretation:  Sinus rhythm Borderline left axis deviation Low voltage, precordial leads No significant change was found Confirmed by Ezequiel Essex [phone]) on 07/20/2017 1:56:38 AM       Radiology Dg Chest 2 View  Result Date: 07/20/2017 CLINICAL DATA:  Right-sided lateral chest pain. Shortness of  breath. EXAM: CHEST  2 VIEW COMPARISON:  12/28/2015 FINDINGS: Cardiomediastinal silhouette is normal. Mediastinal contours appear intact. There is no evidence of pneumothorax. Linear airspace opacities in the right middle lobe and bilateral lower lobes. Peribronchial airspace consolidation in the left hilar region. Osseous structures are without acute abnormality. Soft tissues are grossly normal. IMPRESSION: Linear airspace opacities in the right middle lobe and bilateral lower lobes. These may represent areas of segmental atelectasis. Peribronchial airspace consolidation in the left hilar region. Unexplained atelectasis may be seen with peribronchial or endobronchial lesions, as well as infectious/inflammatory changes. If further imaging evaluation is desired, CT of the chest with contrast may be considered. Electronically Signed   By: Fidela Salisbury M.D.   On: 07/20/2017 02:06   Ct Angio Chest Pe W And/or Wo Contrast  Result Date: 07/20/2017 CLINICAL DATA:  66 year old female with positive D-dimer. Right-sided chest pain. Concern for pulmonary embolism. EXAM: CT ANGIOGRAPHY CHEST WITH CONTRAST TECHNIQUE: Multidetector CT imaging of the chest was performed using the standard protocol during bolus administration of intravenous contrast. Multiplanar CT image reconstructions and MIPs were obtained to evaluate the vascular anatomy. CONTRAST:  111mL ISOVUE-370 IOPAMIDOL (ISOVUE-370) INJECTION 76% COMPARISON:  Chest radiograph dated 07/20/2017 FINDINGS: Cardiovascular: Borderline cardiomegaly. No pericardial effusion. The thoracic aorta is unremarkable as visualized. There is limited evaluation of the thoracic aorta due to non opacification and timing of the contrast. There is no CT evidence of pulmonary embolism. Mediastinum/Nodes: Top-normal bilateral hilar lymph nodes measuring 10 mm on the left. The esophagus is grossly unremarkable. Lungs/Pleura: Linear atelectasis/ scarring in the left upper lobe. There is  subsegmental consolidative changes of the right middle lobe most likely atelectasis, although infiltrate not excluded. Linear atelectatic changes/ scarring noted in the lower lobes. There is no pleural effusion or pneumothorax. The central airways are patent. Upper Abdomen: No acute abnormality. Musculoskeletal: Degenerative changes of the spine. No acute osseous pathology. Partially visualized lower cervical fusion hardware. 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 Review of the MIP images confirms the above findings. IMPRESSION: 1. No CT evidence of pulmonary embolism. 2. Right middle lobe subsegmental atelectasis versus less likely infiltrate. Clinical correlation is recommended. Bilateral lower lobe and left upper lobe linear atelectasis/scarring. Electronically Signed   By: Anner Crete M.D.   On: 07/20/2017 05:24   US Renal  Result  Date: 07/20/2017 CLINICAL DATA:  66 year old female with right upper quadrant abdominal pain. EXAM: RENAL / URINARY TRACT ULTRASOUND COMPLETE COMPARISON:  None. FINDINGS: Right Kidney: Length: 9.9 cm. Normal echogenicity. No hydronephrosis or echogenic calculus. Left Kidney: Length: 11.4 cm. Normal echogenicity. No hydronephrosis or obstructing stone. Linear echogenic foci, likely vascular calcification and less likely a nonobstructing calculi. Bladder: Appears normal for degree of bladder distention. IMPRESSION: No hydronephrosis or obstructing stone. Electronically Signed   By: Anner Crete M.D.   On: 07/20/2017 04:17   US Abdomen Limited Ruq  Result Date: 07/20/2017 CLINICAL DATA:  66 year old female with right upper quadrant abdominal pain. EXAM: ULTRASOUND ABDOMEN LIMITED RIGHT UPPER QUADRANT COMPARISON:  None. FINDINGS: Gallbladder: There is sludge within the gallbladder. No gallbladder wall thickening or pericholecystic fluid. Common bile duct: Diameter: 5 mm Liver: Unremarkable as  visualized. Portal vein is patent on color Doppler imaging with normal direction of blood flow towards the liver. IMPRESSION: 1. Gallbladder sludge.  No sonographic evidence acute cholecystitis. 2. Patent main portal vein with hepatopetal flow. Electronically Signed   By: Anner Crete M.D.   On: 07/20/2017 04:13    Procedures Procedures (including critical care time)  Medications Ordered in ED Medications - No data to display   Initial Impression / Assessment and Plan / ED Course  I have reviewed the triage vital signs and the nursing notes.  Pertinent labs & imaging results that were available during my care of the patient were reviewed by me and considered in my medical decision making (see chart for details).    Patient with right upper quadrant pain and brief chest pain that is now resolved.  Has chronic left hip pain that is unchanged.  Neurologically intact.  We will check labs and EKG.  Check ultrasound to evaluate gallbladder.  Does have small amount of blood in her urine but no infection.  LFTs and lipase normal.  Age-adjusted d-dimer negative.  Chest x-ray is abnormal with atelectasis on the right side perihilar infiltrate on the left.  US shows gallbladder sludge.  US renal negative.  Low suspicion for ACS or PE. Will treat for possible CAP. Result d/w patient.  Follow gallbladder diet and follow up with surgery for further assessment of gallbladder.  Return precautions discussed.  Final Clinical Impressions(s) / ED Diagnoses   Final diagnoses:  RUQ pain  Gallbladder sludge  Community acquired pneumonia of right middle lobe of lung [facility])    ED Discharge Orders    None       Marilin Kofman, Annie Main, MD 07/20/17 [phone]

## 2017-07-20 NOTE — ED Notes (Signed)
Patient transported to Ultrasound 

## 2017-07-20 NOTE — ED Notes (Signed)
Patient returned from CT

## 2017-07-20 NOTE — Discharge Instructions (Signed)
Follow-up with the surgeon to evaluate your gallbladder.  Avoid greasy and spicy foods.  Return to return to the ED if you develop chest pain, shortness of breath or other concerns.

## 2017-07-20 NOTE — ED Notes (Signed)
Patient is getting scans at this time; Family remains in room

## 2017-08-23 ENCOUNTER — Other Ambulatory Visit: Payer: Self-pay | Admitting: Surgery

## 2017-08-23 DIAGNOSIS — R198 Other specified symptoms and signs involving the digestive system and abdomen: Secondary | ICD-10-CM

## 2017-08-23 DIAGNOSIS — M5440 Lumbago with sciatica, unspecified side: Secondary | ICD-10-CM | POA: Diagnosis not present

## 2017-08-23 DIAGNOSIS — M542 Cervicalgia: Secondary | ICD-10-CM | POA: Diagnosis not present

## 2017-08-24 ENCOUNTER — Other Ambulatory Visit: Payer: Self-pay | Admitting: Neurosurgery

## 2017-08-24 ENCOUNTER — Ambulatory Visit
Admission: RE | Admit: 2017-08-24 | Discharge: 2017-08-24 | Disposition: A | Discharge status: Home / Self Care | Payer: Medicare Other | Source: Ambulatory Visit | Attending: Neurosurgery | Admitting: Neurosurgery

## 2017-08-24 DIAGNOSIS — M542 Cervicalgia: Secondary | ICD-10-CM

## 2017-08-24 DIAGNOSIS — M5440 Lumbago with sciatica, unspecified side: Secondary | ICD-10-CM

## 2017-08-24 DIAGNOSIS — M4326 Fusion of spine, lumbar region: Secondary | ICD-10-CM | POA: Diagnosis not present

## 2017-08-24 DIAGNOSIS — M4804 Spinal stenosis, thoracic region: Secondary | ICD-10-CM | POA: Diagnosis not present

## 2017-08-24 DIAGNOSIS — M4322 Fusion of spine, cervical region: Secondary | ICD-10-CM | POA: Diagnosis not present

## 2017-08-27 ENCOUNTER — Other Ambulatory Visit: Payer: Medicare Other

## 2017-08-28 DIAGNOSIS — Z6833 Body mass index (BMI) 33.0-33.9, adult: Secondary | ICD-10-CM | POA: Diagnosis not present

## 2017-08-28 DIAGNOSIS — R030 Elevated blood-pressure reading, without diagnosis of hypertension: Secondary | ICD-10-CM | POA: Diagnosis not present

## 2017-08-28 DIAGNOSIS — M5440 Lumbago with sciatica, unspecified side: Secondary | ICD-10-CM | POA: Diagnosis not present

## 2017-08-31 DIAGNOSIS — K839 Disease of biliary tract, unspecified: Secondary | ICD-10-CM | POA: Diagnosis not present

## 2017-08-31 DIAGNOSIS — M2550 Pain in unspecified joint: Secondary | ICD-10-CM | POA: Diagnosis not present

## 2017-08-31 DIAGNOSIS — M779 Enthesopathy, unspecified: Secondary | ICD-10-CM | POA: Diagnosis not present

## 2017-08-31 DIAGNOSIS — E785 Hyperlipidemia, unspecified: Secondary | ICD-10-CM | POA: Diagnosis not present

## 2017-09-11 DIAGNOSIS — Z6833 Body mass index (BMI) 33.0-33.9, adult: Secondary | ICD-10-CM | POA: Diagnosis not present

## 2017-09-11 DIAGNOSIS — M47816 Spondylosis without myelopathy or radiculopathy, lumbar region: Secondary | ICD-10-CM | POA: Diagnosis not present

## 2017-09-19 ENCOUNTER — Ambulatory Visit (INDEPENDENT_AMBULATORY_CARE_PROVIDER_SITE_OTHER): Payer: Medicare Other

## 2017-09-19 ENCOUNTER — Other Ambulatory Visit: Payer: Self-pay | Admitting: Podiatry

## 2017-09-19 ENCOUNTER — Ambulatory Visit (INDEPENDENT_AMBULATORY_CARE_PROVIDER_SITE_OTHER): Payer: Medicare Other | Admitting: Podiatry

## 2017-09-19 ENCOUNTER — Encounter: Payer: Self-pay | Admitting: Podiatry

## 2017-09-19 VITALS — BP 143/85 | HR 88

## 2017-09-19 DIAGNOSIS — R52 Pain, unspecified: Secondary | ICD-10-CM

## 2017-09-19 DIAGNOSIS — M7751 Other enthesopathy of right foot: Secondary | ICD-10-CM

## 2017-09-19 DIAGNOSIS — M722 Plantar fascial fibromatosis: Secondary | ICD-10-CM

## 2017-09-19 MED ORDER — MELOXICAM 7.5 MG PO TABS: 7.5000 mg | ORAL_TABLET | Freq: Every day | ORAL | 0 refills | Status: DC

## 2017-09-19 NOTE — Progress Notes (Signed)
   Subjective:    Patient ID: Julie Obrien, female    DOB: [birth year], 67 y.o.   MRN: [id number]  HPI this patient presents the office with chief complaint of pain in her right heel.  She experiences pain after walking on her foot during the course of the day. She says her pain level is approximately 8 out of 10.  She says she has provided no self treatment nor sought any professional help.  No history of trauma or reinjury to the right foot.  She presents the office today stating she has a history of back arthritis.  She presents the office today for an evaluation and treatment of her painful right heel..    Review of Systems  All other systems reviewed and are negative.      Objective:   Physical Exam General Appearance  Alert, conversant and in no acute stress.  Vascular  Dorsalis pedis and posterior pulses are palpable  bilaterally.  Capillary return is within normal limits  bilaterally. Temperature is within normal limits  Bilaterally.  Neurologic  Senn-Weinstein monofilament wire test within normal limits  bilaterally. Muscle power within normal limits bilaterally.  Nails Normal nails noted with no evidence of bacterial or fungal infection.  Orthopedic  No limitations of motion of motion feet bilaterally.  No crepitus or effusions noted.  No bony pathology or digital deformities noted. Palpable pain noted at the insertion of the plantar fascia of the right heel.  Pain noted at the insertion of the Achilles tendon of the right heel.  Skin  normotropic skin with no porokeratosis noted bilaterally.  No signs of infections or ulcers noted.         Assessment & Plan:  Plantar fascitis right heel  Retrocalcaneal bursitis right heel  IE  x-rays reveal calcification at the insertion of the plantar fascia of the right foot.  Calcification is also noted at the insertion of Achilles tendon, right foot.  Arthritic changes noted dorsally on the rear foot complex.  Discuss this condition  with this patient and told her to ice and exercise her right foot.  She was also recommended to wear power step insoles in better shoes until pain resolves.  Prescribed Mobic 7.5 #30 one qd.  Injection therapy right heel.  Injection therapy using 1.0 cc. Of 2% xylocaine( 20 mg.) plus 1 cc. of kenalog-la ( 10 mg) plus 1/2 cc. of dexamethazone phosphate ( 2 mg).  RTC 3 weeks after her vacation.

## 2017-09-25 DIAGNOSIS — M461 Sacroiliitis, not elsewhere classified: Secondary | ICD-10-CM | POA: Diagnosis not present

## 2017-10-10 ENCOUNTER — Ambulatory Visit: Payer: Medicare Other | Admitting: Podiatry

## 2017-10-17 DIAGNOSIS — Z6833 Body mass index (BMI) 33.0-33.9, adult: Secondary | ICD-10-CM | POA: Diagnosis not present

## 2017-10-17 DIAGNOSIS — R030 Elevated blood-pressure reading, without diagnosis of hypertension: Secondary | ICD-10-CM | POA: Diagnosis not present

## 2017-10-17 DIAGNOSIS — M461 Sacroiliitis, not elsewhere classified: Secondary | ICD-10-CM | POA: Diagnosis not present

## 2017-11-08 DIAGNOSIS — Z122 Encounter for screening for malignant neoplasm of respiratory organs: Secondary | ICD-10-CM | POA: Diagnosis not present

## 2017-12-17 DIAGNOSIS — Z6830 Body mass index (BMI) 30.0-30.9, adult: Secondary | ICD-10-CM | POA: Diagnosis not present

## 2017-12-17 DIAGNOSIS — M47816 Spondylosis without myelopathy or radiculopathy, lumbar region: Secondary | ICD-10-CM | POA: Diagnosis not present

## 2017-12-17 DIAGNOSIS — R030 Elevated blood-pressure reading, without diagnosis of hypertension: Secondary | ICD-10-CM | POA: Diagnosis not present

## 2017-12-17 DIAGNOSIS — M461 Sacroiliitis, not elsewhere classified: Secondary | ICD-10-CM | POA: Diagnosis not present

## 2017-12-17 DIAGNOSIS — M961 Postlaminectomy syndrome, not elsewhere classified: Secondary | ICD-10-CM | POA: Diagnosis not present

## 2017-12-25 DIAGNOSIS — M461 Sacroiliitis, not elsewhere classified: Secondary | ICD-10-CM | POA: Diagnosis not present

## 2017-12-25 DIAGNOSIS — I10 Essential (primary) hypertension: Secondary | ICD-10-CM | POA: Diagnosis not present

## 2017-12-25 DIAGNOSIS — Z6830 Body mass index (BMI) 30.0-30.9, adult: Secondary | ICD-10-CM | POA: Diagnosis not present

## 2017-12-26 DIAGNOSIS — J209 Acute bronchitis, unspecified: Secondary | ICD-10-CM | POA: Diagnosis not present

## 2017-12-26 DIAGNOSIS — H6690 Otitis media, unspecified, unspecified ear: Secondary | ICD-10-CM | POA: Diagnosis not present

## 2018-01-08 DIAGNOSIS — I10 Essential (primary) hypertension: Secondary | ICD-10-CM | POA: Diagnosis not present

## 2018-01-08 DIAGNOSIS — M961 Postlaminectomy syndrome, not elsewhere classified: Secondary | ICD-10-CM | POA: Diagnosis not present

## 2018-01-08 DIAGNOSIS — Z6830 Body mass index (BMI) 30.0-30.9, adult: Secondary | ICD-10-CM | POA: Diagnosis not present

## 2018-01-08 DIAGNOSIS — M7062 Trochanteric bursitis, left hip: Secondary | ICD-10-CM | POA: Diagnosis not present

## 2018-01-08 DIAGNOSIS — M461 Sacroiliitis, not elsewhere classified: Secondary | ICD-10-CM | POA: Diagnosis not present

## 2018-01-08 DIAGNOSIS — M7061 Trochanteric bursitis, right hip: Secondary | ICD-10-CM | POA: Diagnosis not present

## 2018-02-10 ENCOUNTER — Ambulatory Visit (HOSPITAL_COMMUNITY)
Admission: EM | Admit: 2018-02-10 | Discharge: 2018-02-10 | Disposition: A | Discharge status: Home / Self Care | Payer: Medicare Other | Attending: Family Medicine | Admitting: Family Medicine

## 2018-02-10 ENCOUNTER — Ambulatory Visit (INDEPENDENT_AMBULATORY_CARE_PROVIDER_SITE_OTHER): Payer: Medicare Other

## 2018-02-10 ENCOUNTER — Encounter (HOSPITAL_COMMUNITY): Payer: Self-pay | Admitting: Emergency Medicine

## 2018-02-10 DIAGNOSIS — R05 Cough: Secondary | ICD-10-CM | POA: Diagnosis not present

## 2018-02-10 DIAGNOSIS — J189 Pneumonia, unspecified organism: Secondary | ICD-10-CM | POA: Diagnosis not present

## 2018-02-10 DIAGNOSIS — R062 Wheezing: Secondary | ICD-10-CM

## 2018-02-10 DIAGNOSIS — R0602 Shortness of breath: Secondary | ICD-10-CM | POA: Diagnosis not present

## 2018-02-10 DIAGNOSIS — J45901 Unspecified asthma with (acute) exacerbation: Secondary | ICD-10-CM

## 2018-02-10 MED ORDER — AZITHROMYCIN 250 MG PO TABS: 250.0000 mg | ORAL_TABLET | Freq: Every day | ORAL | 0 refills | Status: DC

## 2018-02-10 MED ORDER — METHYLPREDNISOLONE SODIUM SUCC 125 MG IJ SOLR
INTRAMUSCULAR | Status: AC
  Filled 2018-02-10: qty 2

## 2018-02-10 MED ORDER — IPRATROPIUM-ALBUTEROL 0.5-2.5 (3) MG/3ML IN SOLN
3.0000 mL | Freq: Once | RESPIRATORY_TRACT | Status: AC
  Administered 2018-02-10: 3 mL via RESPIRATORY_TRACT

## 2018-02-10 MED ORDER — PREDNISONE 10 MG PO TABS: 20.0000 mg | ORAL_TABLET | Freq: Two times a day (BID) | ORAL | 0 refills | Status: DC

## 2018-02-10 MED ORDER — IPRATROPIUM-ALBUTEROL 0.5-2.5 (3) MG/3ML IN SOLN
RESPIRATORY_TRACT | Status: AC
  Filled 2018-02-10: qty 3

## 2018-02-10 MED ORDER — METHYLPREDNISOLONE SODIUM SUCC 125 MG IJ SOLR
125.0000 mg | Freq: Once | INTRAMUSCULAR | Status: AC
  Administered 2018-02-10: 125 mg via INTRAMUSCULAR

## 2018-02-10 MED ORDER — IPRATROPIUM-ALBUTEROL 0.5-2.5 (3) MG/3ML IN SOLN: 3.0000 mL | Freq: Once | RESPIRATORY_TRACT | Status: DC

## 2018-02-10 NOTE — ED Provider Notes (Signed)
[place]    CSN: [id number] Arrival date & time: 02/10/18  1440     History   Chief Complaint Chief Complaint  Patient presents with  . Appointment    230, late arrival   . Cough    HPI Julie Obrien is a 67 y.o. female.   HPI  Patient states she is been sick for a couple of weeks.  She has had a cough.  In the last 2 days she has had increased shortness of breath.  She states she also has some sinus drainage and runny nose.  She is producing green sputum.  She is feeling tired.  No sweats or chills.  No fever.  She did not take her temperature at home.  No chest pain.  She does feel acutely short of breath.  She states she does not have any underlying asthma.  She states she does wheeze when she has respiratory infections.  She states her doctor usually gives her a few days of prednisone.  She is a non-smoker.  She had pneumonia in March 2017.  Past Medical History:  Diagnosis Date  . Arthritis    hnp, low back , knees - " all OA"  . Complication of anesthesia    nausea one surgery more recent  . History of kidney stones   . Pneumonia    Feb.- Mar. 2017, not hospitalized, but pending surgery was cancelled ,since then, pt. seen by PCP, told that it was cleared    . PONV (postoperative nausea and vomiting)     Patient Active Problem List   Diagnosis Date Noted  . Asthma with acute exacerbation 02/10/2018  . Spinal stenosis of lumbar region 01/05/2016  . Thoracic spine fracture, closed, initial encounter 06/21/2015  . 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 BACK SURGERY  08  . CERVICAL DISCECTOMY  09  . CESAREAN SECTION     x4  . CYST EXCISION     vocal cords?  . CYSTO  99   stone  . HARDWARE REMOVAL N/A 06/21/2015   Procedure: Exploration of Fusion and removal of hardware right thoracic level one;  Surgeon: Kary Kos, MD;  Location: [facility];  Service: Neurosurgery;  Laterality:  N/A;  . POSTERIOR CERVICAL FUSION/FORAMINOTOMY N/A 05/31/2015   Procedure: Decompression and Laminectomy Cervical three-four  Cervical six-seven  Cervical seven-thoracic-one Posterior cervical fusion with lateral mass fixation;  Surgeon: Kary Kos, MD;  Location: [facility];  Service: Neurosurgery;  Laterality: N/A;  . TONSILLECTOMY    . TUBAL LIGATION      OB History   None      Home Medications    Prior to Admission medications   Medication Sig Start Date End Date Taking? Authorizing Provider  azithromycin (ZITHROMAX) 250 MG tablet Take 1 tablet (250 mg total) by mouth daily. Take first 2 tablets together, then 1 every day until finished. 02/10/18   Raylene Everts, MD  cyclobenzaprine (FLEXERIL) 10 MG tablet Take 10 mg by mouth 3 (three) times daily.    [provider]  esomeprazole (NEXIUM) 20 MG packet Take 20 mg by mouth daily before breakfast.    [provider]  gabapentin (NEURONTIN) 300 MG capsule Take 300 mg by mouth 3 (three) times daily.    [provider]  meloxicam (MOBIC) 7.5 MG tablet Take 1 tablet (7.5 mg total) by mouth daily. 09/19/17   Gardiner Barefoot, DPM  predniSONE (  DELTASONE) 10 MG tablet Take 2 tablets (20 mg total) by mouth 2 (two) times daily with a meal. 02/10/18   Raylene Everts, MD  temazepam (RESTORIL) 15 MG capsule TAKE 1 CAPSULE BY MOUTH EVERY DAY AT BEDTIME AS NEEDED 08/31/17   [provider]  TURMERIC PO Take by mouth 2 (two) times daily.    [provider]    Family History No family history on file.  Social History Social History   Tobacco Use  . Smoking status: Current Every Day Smoker    Packs/day: 0.50    Years: 10.00    Pack years: 5.00    Types: Cigarettes  . Smokeless tobacco: Never Used  Substance Use Topics  . Alcohol use: No  . Drug use: No     Allergies   Sulfa antibiotics   Review of Systems Review of Systems  Constitutional: Positive for fatigue. Negative for chills,  diaphoresis and fever.  HENT: Negative for ear pain, sinus pressure, sinus pain and sore throat.   Eyes: Negative for pain and visual disturbance.  Respiratory: Positive for cough, shortness of breath and wheezing.   Cardiovascular: Negative for chest pain and palpitations.  Gastrointestinal: Negative for abdominal pain, nausea and vomiting.  Genitourinary: Negative for dysuria and hematuria.  Musculoskeletal: Negative for arthralgias and back pain.  Skin: Negative for color change and rash.  Neurological: Negative for seizures and syncope.  All other systems reviewed and are negative.    Physical Exam Triage Vital Signs ED Triage Vitals [02/10/18 1450]  Enc Vitals Group     BP (!) 157/78     Pulse Rate (!) 114     Resp (!) 30     Temp 98.5 F (36.9 C)     Temp src      SpO2 90 %     Weight      Height      Head Circumference      Peak Flow      Pain Score      Pain Loc      Pain Edu?      Excl. in [place]?    No data found.  Updated Vital Signs BP (!) 146/75   Pulse 93   Temp 98.5 F (36.9 C)   Resp (!) 26   SpO2 90%       Physical Exam  Constitutional: She appears well-developed and well-nourished. No distress.  Appears tired, moderately ill  HENT:  Head: Normocephalic and atraumatic.  Right Ear: External ear normal.  Left Ear: External ear normal.  Mouth/Throat: Oropharynx is clear and moist.  Eyes: Pupils are equal, round, and reactive to light. Conjunctivae are normal.  Neck: Normal range of motion.  Cardiovascular: Regular rhythm and normal heart sounds.  Tachycardia  Pulmonary/Chest: Effort normal. No respiratory distress. She has wheezes. She has rales. She exhibits no tenderness.  On initial evaluation wheezes predominate both lung fields with inspiration.  Dyspnea.  After nebulized treatment, patient has scattered wheezes, rales both bases left greater than right  Abdominal: Soft. She exhibits no distension. There is no tenderness.  Musculoskeletal:  Normal range of motion. She exhibits no edema.  Lymphadenopathy:    She has no cervical adenopathy.  Neurological: She is alert.  Skin: Skin is warm and dry.  Psychiatric: She has a normal mood and affect. Her behavior is normal.     UC Treatments / Results  Labs (all labs ordered are listed, but only abnormal results are displayed) Labs  Reviewed - No data to display  EKG None  Radiology Dg Chest 2 View  Result Date: 02/10/2018 CLINICAL DATA:  Cough, wheezing and shortness of breath for 1 day. EXAM: CHEST - 2 VIEW COMPARISON:  CT of the chest 07/20/2017 FINDINGS: Cardiomediastinal silhouette is normal. Mediastinal contours appear intact. There is no evidence of pleural effusion or pneumothorax. Mild peribronchial thickening which may be seen with bronchitis or reactive airway disease. Atelectasis versus airspace consolidation in the left middle lobe. Osseous structures are without acute abnormality. Soft tissues are grossly normal. IMPRESSION: Mild peribronchial thickening which may be seen with bronchitis or reactive airway disease. Focal area of atelectasis versus airspace consolidation in the left middle lobe. Electronically Signed   By: Fidela Salisbury M.D.   On: 02/10/2018 16:08    Procedures Procedures (including critical care time)  Medications Ordered in UC Medications  ipratropium-albuterol (DUONEB) 0.5-2.5 (3) MG/3ML nebulizer solution 3 mL (3 mLs Nebulization Given 02/10/18 1455)  methylPREDNISolone sodium succinate (SOLU-MEDROL) 125 mg/2 mL injection 125 mg (125 mg Intramuscular Given 02/10/18 1500)    Initial Impression / Assessment and Plan / UC Course  I have reviewed the triage vital signs and the nursing notes.  Pertinent labs & imaging results that were available during my care of the patient were reviewed by me and considered in my medical decision making (see chart for details).     Patient has no history of asthma.  Certainly reactive airways disease  today.  Respiratory infection with likely lower respiratory tract/pneumonia disease.  Discussed inpatient versus outpatient treatment.  Patient feels well enough to go home.  No vomiting.  She can keep down her medicines.  Her husband is there to help her.  She understands she needs to go back to the ER promptly should the shortness of breath worsen or develop worsening symptoms outlined below. Final Clinical Impressions(s) / UC Diagnoses   Final diagnoses:  Community acquired pneumonia of left lung, unspecified part of lung  Wheezing     Discharge Instructions     Get plenty of rest Drink lots of water and fluids Take the antibiotic as directed Take prednisone twice a day for 5 days Use your inhaler every 4 hours as needed for wheezing or shortness of breath If you start having increased shortness of breath, chest pain, weakness, dizziness, or fever you need to go to the emergency room     ED Prescriptions    Medication Sig Dispense Auth. Provider   predniSONE (DELTASONE) 10 MG tablet Take 2 tablets (20 mg total) by mouth 2 (two) times daily with a meal. 20 tablet Raylene Everts, MD   azithromycin (ZITHROMAX) 250 MG tablet Take 1 tablet (250 mg total) by mouth daily. Take first 2 tablets together, then 1 every day until finished. 6 tablet Raylene Everts, MD     Controlled Substance Prescriptions [place] Controlled Substance Registry consulted? Not Applicable   Raylene Everts, MD 02/10/18 2126

## 2018-02-10 NOTE — ED Triage Notes (Signed)
Pt c/o cough, wheezing, sob since yesterday.

## 2018-02-10 NOTE — ED Notes (Signed)
Bed: UC01 Expected date:  Expected time:  Means of arrival:  Comments: 

## 2018-02-10 NOTE — Discharge Instructions (Signed)
Get plenty of rest Drink lots of water and fluids Take the antibiotic as directed Take prednisone twice a day for 5 days Use your inhaler every 4 hours as needed for wheezing or shortness of breath If you start having increased shortness of breath, chest pain, weakness, dizziness, or fever you need to go to the emergency room

## 2018-02-11 ENCOUNTER — Encounter (HOSPITAL_COMMUNITY): Payer: Self-pay | Admitting: Emergency Medicine

## 2018-02-11 ENCOUNTER — Inpatient Hospital Stay (HOSPITAL_COMMUNITY)
Admission: EM | Admit: 2018-02-11 | Discharge: 2018-02-13 | DRG: 190 | Disposition: A | Discharge status: Home / Self Care | Payer: Medicare Other | Attending: Family Medicine | Admitting: Family Medicine

## 2018-02-11 ENCOUNTER — Emergency Department (HOSPITAL_COMMUNITY): Payer: Medicare Other

## 2018-02-11 ENCOUNTER — Other Ambulatory Visit: Payer: Self-pay

## 2018-02-11 DIAGNOSIS — Z981 Arthrodesis status: Secondary | ICD-10-CM | POA: Diagnosis not present

## 2018-02-11 DIAGNOSIS — J189 Pneumonia, unspecified organism: Secondary | ICD-10-CM | POA: Diagnosis not present

## 2018-02-11 DIAGNOSIS — Z881 Allergy status to other antibiotic agents status: Secondary | ICD-10-CM | POA: Diagnosis not present

## 2018-02-11 DIAGNOSIS — J440 Chronic obstructive pulmonary disease with acute lower respiratory infection: Secondary | ICD-10-CM | POA: Diagnosis present

## 2018-02-11 DIAGNOSIS — Z882 Allergy status to sulfonamides status: Secondary | ICD-10-CM | POA: Diagnosis not present

## 2018-02-11 DIAGNOSIS — Z79899 Other long term (current) drug therapy: Secondary | ICD-10-CM

## 2018-02-11 DIAGNOSIS — J441 Chronic obstructive pulmonary disease with (acute) exacerbation: Secondary | ICD-10-CM | POA: Diagnosis not present

## 2018-02-11 DIAGNOSIS — F17210 Nicotine dependence, cigarettes, uncomplicated: Secondary | ICD-10-CM | POA: Diagnosis present

## 2018-02-11 DIAGNOSIS — Z720 Tobacco use: Secondary | ICD-10-CM | POA: Diagnosis not present

## 2018-02-11 DIAGNOSIS — R062 Wheezing: Secondary | ICD-10-CM

## 2018-02-11 DIAGNOSIS — R0902 Hypoxemia: Secondary | ICD-10-CM | POA: Diagnosis present

## 2018-02-11 DIAGNOSIS — F17200 Nicotine dependence, unspecified, uncomplicated: Secondary | ICD-10-CM | POA: Diagnosis not present

## 2018-02-11 DIAGNOSIS — R0602 Shortness of breath: Secondary | ICD-10-CM

## 2018-02-11 DIAGNOSIS — Z7952 Long term (current) use of systemic steroids: Secondary | ICD-10-CM | POA: Diagnosis not present

## 2018-02-11 DIAGNOSIS — M1990 Unspecified osteoarthritis, unspecified site: Secondary | ICD-10-CM | POA: Diagnosis present

## 2018-02-11 DIAGNOSIS — J181 Lobar pneumonia, unspecified organism: Secondary | ICD-10-CM | POA: Diagnosis not present

## 2018-02-11 DIAGNOSIS — Z09 Encounter for follow-up examination after completed treatment for conditions other than malignant neoplasm: Secondary | ICD-10-CM | POA: Diagnosis not present

## 2018-02-11 DIAGNOSIS — R05 Cough: Secondary | ICD-10-CM | POA: Diagnosis not present

## 2018-02-11 LAB — URINALYSIS, ROUTINE W REFLEX MICROSCOPIC
Bilirubin Urine: NEGATIVE
GLUCOSE, UA: NEGATIVE mg/dL
Ketones, ur: NEGATIVE mg/dL
Nitrite: NEGATIVE
PROTEIN: 30 mg/dL — AB
SPECIFIC GRAVITY, URINE: 1.019 (ref 1.005–1.030)
pH: 5 (ref 5.0–8.0)

## 2018-02-11 LAB — CBC WITH DIFFERENTIAL/PLATELET
Abs Immature Granulocytes: 0.1 10*3/uL (ref 0.0–0.1)
BASOS ABS: 0 10*3/uL (ref 0.0–0.1)
BASOS PCT: 0 %
Eosinophils Absolute: 0 10*3/uL (ref 0.0–0.7)
Eosinophils Relative: 0 %
HCT: 40.7 % (ref 36.0–46.0)
Hemoglobin: 13 g/dL (ref 12.0–15.0)
Immature Granulocytes: 1 %
Lymphocytes Relative: 10 %
Lymphs Abs: 2 10*3/uL (ref 0.7–4.0)
MCH: 26.3 pg (ref 26.0–34.0)
MCHC: 31.9 g/dL (ref 30.0–36.0)
MCV: 82.4 fL (ref 78.0–100.0)
MONO ABS: 0.9 10*3/uL (ref 0.1–1.0)
Monocytes Relative: 5 %
Neutro Abs: 16.5 10*3/uL — ABNORMAL HIGH (ref 1.7–7.7)
Neutrophils Relative %: 84 %
Platelets: 365 10*3/uL (ref 150–400)
RBC: 4.94 MIL/uL (ref 3.87–5.11)
RDW: 15.2 % (ref 11.5–15.5)
WBC: 19.5 10*3/uL — AB (ref 4.0–10.5)

## 2018-02-11 LAB — COMPREHENSIVE METABOLIC PANEL
ALBUMIN: 3.6 g/dL (ref 3.5–5.0)
ALK PHOS: 83 U/L (ref 38–126)
ALT: 16 U/L (ref 0–44)
ANION GAP: 13 (ref 5–15)
AST: 33 U/L (ref 15–41)
BILIRUBIN TOTAL: 0.4 mg/dL (ref 0.3–1.2)
BUN: 16 mg/dL (ref 8–23)
CALCIUM: 9.8 mg/dL (ref 8.9–10.3)
CO2: 25 mmol/L (ref 22–32)
Chloride: 106 mmol/L (ref 98–111)
Creatinine, Ser: 0.89 mg/dL (ref 0.44–1.00)
GFR calc Af Amer: >60 mL/min (ref >60)
GFR calc non Af Amer: >60 mL/min (ref >60)
GLUCOSE: 142 mg/dL — AB (ref 70–99)
Potassium: 4.5 mmol/L (ref 3.5–5.1)
Sodium: 144 mmol/L (ref 135–145)
TOTAL PROTEIN: 7.1 g/dL (ref 6.5–8.1)

## 2018-02-11 LAB — I-STAT CG4 LACTIC ACID, ED
LACTIC ACID, VENOUS: 2.94 mmol/L — AB (ref 0.5–1.9)
Lactic Acid, Venous: 1.21 mmol/L (ref 0.5–1.9)

## 2018-02-11 MED ORDER — PREDNISONE 20 MG PO TABS
20.0000 mg | ORAL_TABLET | Freq: Once | ORAL | Status: AC
  Administered 2018-02-11: 20 mg via ORAL
  Filled 2018-02-11: qty 1

## 2018-02-11 MED ORDER — AZITHROMYCIN 250 MG PO TABS: 250.0000 mg | ORAL_TABLET | Freq: Once | ORAL | Status: DC

## 2018-02-11 MED ORDER — MAGNESIUM SULFATE 2 GM/50ML IV SOLN
2.0000 g | Freq: Once | INTRAVENOUS | Status: AC
  Administered 2018-02-11: 2 g via INTRAVENOUS
  Filled 2018-02-11: qty 50

## 2018-02-11 MED ORDER — IPRATROPIUM-ALBUTEROL 0.5-2.5 (3) MG/3ML IN SOLN
3.0000 mL | Freq: Once | RESPIRATORY_TRACT | Status: AC
  Administered 2018-02-11: 3 mL via RESPIRATORY_TRACT
  Filled 2018-02-11: qty 3

## 2018-02-11 NOTE — ED Triage Notes (Signed)
Pt seen by [facility] yesterday and PCP today. Pt complains of SHOB and productive cough. Pt was diagnosed with early pneumonia and told her oxygen was 80%. Pt given 2 ned tx by PCP states it helped some but is now worse again. Pt's saturations are 87-90% on room air. Pt tachycardic- 112

## 2018-02-11 NOTE — ED Notes (Signed)
Ambulated pt in hallway, saturations initially 97% on room air. Ambulated around nurses station, o2 remained >92% on room air. Hr maintained b/w 105-115 during ambulation. Pt did get tachypneic during ambulation and reported she did feel somewhat SOB. Returned to the side of the bed to rest. Denies pain. PA notified of the same. Comfort measures.

## 2018-02-11 NOTE — ED Notes (Signed)
Removed patients oxygen for observation and will ambulate pt per PA

## 2018-02-11 NOTE — H&P (Signed)
[facility]                                                                                    Patient Demographics  Julie Obrien, is a 67 y.o. female  CSN: [id number]  MRN: [id number]  DOB - 09-18-1950  Admit Date - 02/11/2018  Outpatient Primary MD for the patient is Glendale Chard, MD   With History of -  Past Medical History:  Diagnosis Date  . Arthritis    hnp, low back , knees - " all OA"  . Complication of anesthesia    nausea one surgery more recent  . History of kidney stones   . Pneumonia    Feb.- Mar. 2017, not hospitalized, but pending surgery was cancelled ,since then, pt. seen by PCP, told that it was cleared    . PONV (postoperative nausea and vomiting)       Past Surgical History:  Procedure Laterality Date  . BACK SURGERY  08  . CERVICAL DISCECTOMY  09  . CESAREAN SECTION     x4  . CYST EXCISION     vocal cords?  . CYSTO  99   stone  . HARDWARE REMOVAL N/A 06/21/2015   Procedure: Exploration of Fusion and removal of hardware right thoracic level one;  Surgeon: Kary Kos, MD;  Location: [place];  Service: Neurosurgery;  Laterality: N/A;  . POSTERIOR CERVICAL FUSION/FORAMINOTOMY N/A 05/31/2015   Procedure: Decompression and Laminectomy Cervical three-four  Cervical six-seven  Cervical seven-thoracic-one Posterior cervical fusion with lateral mass fixation;  Surgeon: Kary Kos, MD;  Location: [facility];  Service: Neurosurgery;  Laterality: N/A;  . TONSILLECTOMY    . TUBAL LIGATION      in for   Chief Complaint  Patient presents with  . Shortness of Breath  . Cough     HPI  Julie Obrien  is a 67 y.o. female, with past medical history significant for smoking presenting today with 2 days history of shortness of breath and cough.  Patient was treated for pneumonia by Z-Pak 2 days ago at the urgent care center and was started on prednisone p.o.  Patient denies chest pains or palpitations.  Patient presented today with  hypoxemia and chest x-ray showed improvement in her pneumonia but the patient continued to be hypoxemic especially on exercise.  Patient is admitted for possible new oxygen requirements.  Reports chills but no documented fever    Review of Systems    In addition to the HPI above,  No Fever No Headache, No changes with Vision or hearing, No problems swallowing food or Liquids, No Chest pain,  No Abdominal pain, No Nausea or Vommitting, Bowel movements are regular, No Blood in stool or Urine, No dysuria, No new skin rashes or bruises, No new joints pains-aches,  No new weakness, tingling, numbness in any extremity, No recent weight gain or loss, No polyuria, polydypsia or polyphagia, No significant Mental Stressors.  A full 10 point Review of Systems was done, except as stated above, all other Review of Systems were negative.   Social History Social History   Tobacco Use  . Smoking status: Current Every Day  Smoker    Packs/day: 0.50    Years: 10.00    Pack years: 5.00    Types: Cigarettes  . Smokeless tobacco: Never Used  Substance Use Topics  . Alcohol use: No     Family History History reviewed. No pertinent family history.   Prior to Admission medications   Medication Sig Start Date End Date Taking? Authorizing Provider  albuterol (VENTOLIN HFA) 108 (90 Base) MCG/ACT inhaler Inhale 2-4 puffs into the lungs every 6 (six) hours as needed for wheezing or shortness of breath.   Yes [provider]  azithromycin (ZITHROMAX) 250 MG tablet Take 1 tablet (250 mg total) by mouth daily. Take first 2 tablets together, then 1 every day until finished. 02/10/18  Yes Raylene Everts, MD  ibuprofen (ADVIL,MOTRIN) 800 MG tablet Take 800 mg by mouth every 8 (eight) hours as needed (for pain).  01/07/18  Yes [provider]  naproxen sodium (ALEVE) 220 MG tablet Take 220-440 mg by mouth 2 (two) times daily as needed (for pain or headaches).   Yes [provider]  predniSONE (DELTASONE) 10 MG tablet Take 2 tablets (20 mg total) by mouth 2 (two) times daily with a meal. 02/10/18  Yes Raylene Everts, MD  meloxicam (MOBIC) 7.5 MG tablet Take 1 tablet (7.5 mg total) by mouth daily. Patient not taking: Reported on 02/11/2018 09/19/17   Gardiner Barefoot, DPM    Allergies  Allergen Reactions  . Augmentin [Amoxicillin-Pot Clavulanate] Nausea And Vomiting    "Tore up my stomach" (verified this with her daughter, a physician)  . Sulfa Antibiotics Hives    Physical Exam  Vitals  Blood pressure (!) 118/94, pulse (!) 109, temperature 98.3 F (36.8 C), temperature source Oral, resp. rate 17, height 5\' 4"  (1.626 m), weight 81.6 kg (180 lb), SpO2 93 %.   1. General extremely pleasant, in moderate shortness of breath  2. Normal affect and insight, Not Suicidal or Homicidal, Awake Alert, Oriented X 3.  3. No F.N deficits, grossly, patient moving all extremities.  4. Ears and Eyes appear Normal, Conjunctivae clear, PERRLA. Moist Oral Mucosa.  5. Supple Neck, No JVD, No cervical lymphadenopathy appriciated, No Carotid Bruits.  6. Symmetrical Chest wall movement, mild scattered wheezing and rhonchi.  7. RRR, No Gallops, Rubs or Murmurs, No Parasternal Heave.  8. Positive Bowel Sounds, Abdomen Soft, Non tender, No organomegaly appriciated,No rebound -guarding or rigidity.  9.  No Cyanosis, Normal Skin Turgor, No Skin Rash or Bruise.  10. Good muscle tone,  joints appear normal , no effusions, Normal ROM.    Data Review  CBC Recent Labs  Lab 02/11/18 1857  WBC 19.5*  HGB 13.0  HCT 40.7  PLT 365  MCV 82.4  MCH 26.3  MCHC 31.9  RDW 15.2  LYMPHSABS 2.0  MONOABS 0.9  EOSABS 0.0  BASOSABS 0.0   ------------------------------------------------------------------------------------------------------------------  Chemistries  Recent Labs  Lab 02/11/18 1857  NA 144  K 4.5  CL 106  CO2 25  GLUCOSE 142*  BUN 16  CREATININE  0.89  CALCIUM 9.8  AST 33  ALT 16  ALKPHOS 83  BILITOT 0.4   ------------------------------------------------------------------------------------------------------------------ estimated creatinine clearance is 63.4 mL/min (by C-G formula based on SCr of 0.89 mg/dL). ------------------------------------------------------------------------------------------------------------------ No results for input(s): TSH, T4TOTAL, T3FREE, THYROIDAB in the last 72 hours.  Invalid input(s): FREET3   Coagulation profile No results for input(s): INR, PROTIME in the last 168 hours. ------------------------------------------------------------------------------------------------------------------- No results for input(s): DDIMER in the last  72 hours. -------------------------------------------------------------------------------------------------------------------  Cardiac Enzymes No results for input(s): CKMB, TROPONINI, MYOGLOBIN in the last 168 hours.  Invalid input(s): CK ------------------------------------------------------------------------------------------------------------------ Invalid input(s): POCBNP   ---------------------------------------------------------------------------------------------------------------  Urinalysis    Component Value Date/Time   COLORURINE YELLOW 02/11/2018 2002   APPEARANCEUR HAZY (A) 02/11/2018 2002   LABSPEC 1.019 02/11/2018 2002   PHURINE 5.0 02/11/2018 2002   GLUCOSEU NEGATIVE 02/11/2018 2002   HGBUR MODERATE (A) 02/11/2018 2002   BILIRUBINUR NEGATIVE 02/11/2018 2002   KETONESUR NEGATIVE 02/11/2018 2002   PROTEINUR 30 (A) 02/11/2018 2002   UROBILINOGEN 0.2 06/01/2015 0820   NITRITE NEGATIVE 02/11/2018 2002   LEUKOCYTESUR MODERATE (A) 02/11/2018 2002    ----------------------------------------------------------------------------------------------------------------     Imaging results:   Dg Chest 2 View  Result Date: 02/11/2018 CLINICAL  DATA:  Shortness of breath and productive cough EXAM: CHEST - 2 VIEW COMPARISON:  02/10/2018, 07/20/2017 FINDINGS: Moderate bronchitic changes. No acute consolidation or effusion. Stable cardiomediastinal silhouette. No pneumothorax. Partially visualized surgical hardware in the spine. IMPRESSION: Moderate bronchitic changes without focal pulmonary infiltrate. Electronically Signed   By: Donavan Foil M.D.   On: 02/11/2018 19:24   Dg Chest 2 View  Result Date: 02/10/2018 CLINICAL DATA:  Cough, wheezing and shortness of breath for 1 day. EXAM: CHEST - 2 VIEW COMPARISON:  CT of the chest 07/20/2017 FINDINGS: Cardiomediastinal silhouette is normal. Mediastinal contours appear intact. There is no evidence of pleural effusion or pneumothorax. Mild peribronchial thickening which may be seen with bronchitis or reactive airway disease. Atelectasis versus airspace consolidation in the left middle lobe. Osseous structures are without acute abnormality. Soft tissues are grossly normal. IMPRESSION: Mild peribronchial thickening which may be seen with bronchitis or reactive airway disease. Focal area of atelectasis versus airspace consolidation in the left middle lobe. Electronically Signed   By: Fidela Salisbury M.D.   On: 02/10/2018 16:08    My personal review of EKG: Sinus tach, rate 110 with left axis deviation    Assessment & Plan   1.  COPD exacerbation with possible new oxygen requirement 2.  Partially treated pneumonia  Plan IV steroids Nebulizer treatments Oxygen by nasal cannula Continue the course of Z-Pak since patient is responding.   DVT Prophylaxis Lovenox  AM Labs Ordered, also please review Full Orders     Code Status   Disposition Plan: Home  Time spent in minutes : 47 minutes  Condition GUARDED   @SIGNATURE @

## 2018-02-11 NOTE — ED Provider Notes (Signed)
[facility] EMERGENCY DEPARTMENT Provider Note   CSN: [id number] Arrival date & time: 02/11/18  1836     History   Chief Complaint Chief Complaint  Patient presents with  . Shortness of Breath  . Cough    HPI Julie Obrien is a 67 y.o. female presenting for evaluation of shortness of breath and cough.  Patient states she started to develop shortness of breath and cough on Saturday.  She took Mucinex without improvement of her symptoms.  She was seen in urgent care yesterday, had an initial pulse ox in the low 80s.  She was given a breathing treatment and a prednisone shot.  X-ray showed an early pneumonia.  She was discharged on prednisone, antibiotics, and told to use her inhaler.  Symptoms worsened today, she went to her primary care doctor's office, where after 2 rounds of breathing treatments, sats were in the upper 80s.  Patient still had shortness of breath.  Patient was told to come to the emergency room for further evaluation and management.  Currently, patient states she feels "lousy."  She is still having shortness of breath and feels tight.  She denies fevers, chills, chest pain, nausea, vomiting, abdominal pain, urinary symptoms, abnormal bowel movements.  She denies leg pain or swelling.  She was recently on vacation in [place], came back on Friday.  She states her granddaughter is also sick.  Patient denies a history of COPD or asthma, but states that every time she gets a cold she needs to use an inhaler.  She smokes cigarettes daily, denies alcohol or drug use.  She has no other medical problems, does not take medications daily.  She has been taking prednisone and azithromycin as prescribed.   HPI  Past Medical History:  Diagnosis Date  . Arthritis    hnp, low back , knees - " all OA"  . Complication of anesthesia    nausea one surgery more recent  . History of kidney stones   . Pneumonia    Feb.- Mar. 2017, not hospitalized, but pending surgery  was cancelled ,since then, pt. seen by PCP, told that it was cleared    . PONV (postoperative nausea and vomiting)     Patient Active Problem List   Diagnosis Date Noted  . Asthma with acute exacerbation 02/10/2018  . Spinal stenosis of lumbar region 01/05/2016  . Thoracic spine fracture, closed, initial encounter 06/21/2015  . 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 BACK SURGERY  08  . CERVICAL DISCECTOMY  09  . CESAREAN SECTION     x4  . CYST EXCISION     vocal cords?  . CYSTO  99   stone  . HARDWARE REMOVAL N/A 06/21/2015   Procedure: Exploration of Fusion and removal of hardware right thoracic level one;  Surgeon: Kary Kos, MD;  Location: [facility];  Service: Neurosurgery;  Laterality: N/A;  . POSTERIOR CERVICAL FUSION/FORAMINOTOMY N/A 05/31/2015   Procedure: Decompression and Laminectomy Cervical three-four  Cervical six-seven  Cervical seven-thoracic-one Posterior cervical fusion with lateral mass fixation;  Surgeon: Kary Kos, MD;  Location: [facility];  Service: Neurosurgery;  Laterality: N/A;  . TONSILLECTOMY    . TUBAL LIGATION       OB History   None      Home Medications    Prior to Admission medications   Medication Sig Start Date End Date Taking? Authorizing Provider  albuterol (VENTOLIN HFA)  108 (90 Base) MCG/ACT inhaler Inhale 2-4 puffs into the lungs every 6 (six) hours as needed for wheezing or shortness of breath.   Yes [provider]  azithromycin (ZITHROMAX) 250 MG tablet Take 1 tablet (250 mg total) by mouth daily. Take first 2 tablets together, then 1 every day until finished. 02/10/18  Yes Raylene Everts, MD  ibuprofen (ADVIL,MOTRIN) 800 MG tablet Take 800 mg by mouth every 8 (eight) hours as needed (for pain).  01/07/18  Yes [provider]  naproxen sodium (ALEVE) 220 MG tablet Take 220-440 mg by mouth 2 (two) times daily as needed (for pain or headaches).    Yes [provider]  predniSONE (DELTASONE) 10 MG tablet Take 2 tablets (20 mg total) by mouth 2 (two) times daily with a meal. 02/10/18  Yes Raylene Everts, MD  meloxicam (MOBIC) 7.5 MG tablet Take 1 tablet (7.5 mg total) by mouth daily. Patient not taking: Reported on 02/11/2018 09/19/17   Gardiner Barefoot, DPM    Family History History reviewed. No pertinent family history.  Social History Social History   Tobacco Use  . Smoking status: Current Every Day Smoker    Packs/day: 0.50    Years: 10.00    Pack years: 5.00    Types: Cigarettes  . Smokeless tobacco: Never Used  Substance Use Topics  . Alcohol use: No  . Drug use: No     Allergies   Augmentin [amoxicillin-pot clavulanate] and Sulfa antibiotics   Review of Systems Review of Systems  Respiratory: Positive for cough, shortness of breath and wheezing.   All other systems reviewed and are negative.    Physical Exam Updated Vital Signs BP 120/66   Pulse 89   Temp 98.3 F (36.8 C) (Oral)   Resp (!) 23   Ht 5\' 4"  (1.626 m)   Wt 81.6 kg (180 lb)   SpO2 97%   BMI 30.90 kg/m   Physical Exam  Constitutional: She is oriented to person, place, and time. She appears well-developed and well-nourished. No distress.  Appears in no distress  HENT:  Head: Normocephalic and atraumatic.  Eyes: Pupils are equal, round, and reactive to light. Conjunctivae and EOM are normal.  Neck: Normal range of motion. Neck supple.  Cardiovascular: Normal rate, regular rhythm and intact distal pulses.  Pulmonary/Chest: Effort normal. Tachypnea noted. No respiratory distress. She has no decreased breath sounds. She has wheezes. She has no rhonchi. She has no rales.  Speaking in short sentences. Mildly tachypneic.  No accessory muscle use.  Wheezes heard in all fields.  Abdominal: Soft. She exhibits no distension and no mass. There is no tenderness. There is no guarding.  Musculoskeletal: Normal range of motion.  No leg pain or  swelling  Neurological: She is alert and oriented to person, place, and time.  Skin: Skin is warm and dry. Capillary refill takes less than 2 seconds.  Psychiatric: She has a normal mood and affect.  Nursing note and vitals reviewed.    ED Treatments / Results  Labs (all labs ordered are listed, but only abnormal results are displayed) Labs Reviewed  COMPREHENSIVE METABOLIC PANEL - Abnormal; Notable for the following components:      Result Value   Glucose, Bld 142 (*)    All other components within normal limits  CBC WITH DIFFERENTIAL/PLATELET - Abnormal; Notable for the following components:   WBC 19.5 (*)    Neutro Abs 16.5 (*)    All other components within normal limits  URINALYSIS, ROUTINE W REFLEX MICROSCOPIC - Abnormal; Notable for the following components:   APPearance HAZY (*)    Hgb urine dipstick MODERATE (*)    Protein, ur 30 (*)    Leukocytes, UA MODERATE (*)    WBC, UA >50 (*)    Bacteria, UA RARE (*)    All other components within normal limits  I-STAT CG4 LACTIC ACID, ED - Abnormal; Notable for the following components:   Lactic Acid, Venous 2.94 (*)    All other components within normal limits  I-STAT CG4 LACTIC ACID, ED    EKG None  Radiology Dg Chest 2 View  Result Date: 02/11/2018 CLINICAL DATA:  Shortness of breath and productive cough EXAM: CHEST - 2 VIEW COMPARISON:  02/10/2018, 07/20/2017 FINDINGS: Moderate bronchitic changes. No acute consolidation or effusion. Stable cardiomediastinal silhouette. No pneumothorax. Partially visualized surgical hardware in the spine. IMPRESSION: Moderate bronchitic changes without focal pulmonary infiltrate. Electronically Signed   By: Donavan Foil M.D.   On: 02/11/2018 19:24   Dg Chest 2 View  Result Date: 02/10/2018 CLINICAL DATA:  Cough, wheezing and shortness of breath for 1 day. EXAM: CHEST - 2 VIEW COMPARISON:  CT of the chest 07/20/2017 FINDINGS: Cardiomediastinal silhouette is normal. Mediastinal contours  appear intact. There is no evidence of pleural effusion or pneumothorax. Mild peribronchial thickening which may be seen with bronchitis or reactive airway disease. Atelectasis versus airspace consolidation in the left middle lobe. Osseous structures are without acute abnormality. Soft tissues are grossly normal. IMPRESSION: Mild peribronchial thickening which may be seen with bronchitis or reactive airway disease. Focal area of atelectasis versus airspace consolidation in the left middle lobe. Electronically Signed   By: Fidela Salisbury M.D.   On: 02/10/2018 16:08    Procedures Procedures (including critical care time)  Medications Ordered in ED Medications  ipratropium-albuterol (DUONEB) 0.5-2.5 (3) MG/3ML nebulizer solution 3 mL (3 mLs Nebulization Given 02/11/18 2106)  magnesium sulfate IVPB 2 g 50 mL (2 g Intravenous New Bag/Given 02/11/18 2108)  predniSONE (DELTASONE) tablet 20 mg (20 mg Oral Given 02/11/18 2106)     Initial Impression / Assessment and Plan / ED Course  I have reviewed the triage vital signs and the nursing notes.  Pertinent labs & imaging results that were available during my care of the patient were reviewed by me and considered in my medical decision making (see chart for details).     Patient presenting for evaluation of shortness of breath and cough.  Physical exam shows female with significant wheezing in all fields.  Initially tachycardic mildly hypoxic around 90%.  Placed on oxygen at 3 L, she is not on oxygen at baseline.  Sats improved to the upper 90s.  Tachycardia improved, to tachypnea persisted.  Labs show leukocytosis at 19.5.  Initial lactic elevated, repeat negative.  UA without obvious infection.  X-ray shows resolution of prior pneumonia. EKG without stemi.  Will give another breathing treatment, home dose of prednisone, and magnesium and reassess.  Patient given medications and taken off oxygen.  Sats remained stable.  Will ambulate patient and  reassess. Likely COPD exacerbation, despite lack of dx of COPD. Discussed with attending, Dr. Roderic Palau agrees to plan.   On ambulation, patient sats remained around 92%, although heart rate increased to about 115.  Patient reports feeling short of breath and like she is wheezing while trying to ambulate.  On reassessment, patient with continued significant wheezing in all fields.  I am concerned that if patient is discharged,  she will return with worsened breathing and hypoxia. Will consult with hospitalist for admission.   Discussed with Dr. Laren Everts, pt to be admitted to [facility].   Final Clinical Impressions(s) / ED Diagnoses   Final diagnoses:  Shortness of breath  Wheezing    ED Discharge Orders    None       Franchot Heidelberg, PA-C 02/11/18 2258    Milton Ferguson, MD 02/14/18 1005

## 2018-02-11 NOTE — ED Provider Notes (Signed)
Patient placed in Quick Look pathway, seen and evaluated   Chief Complaint: shortness of breath, cough  HPI:   Patient presents with worsening productive cough and shortness of breath. Endorses dyspnea on exertion denies chest pain. No fevers. Seen and evaluated in urgent care yesterday diagnosedwith pneumonia and bronchitis. Wanted to go home, was given Z-Pak and prednisone. Symptoms worsened, was seen and evaluate by her primary care physician and was given 2 breathing treatments earlier today with temporary relief. Current smoker.SPO2 saturations 87-90% on room air, improved with O2 via nasal cannula.no recent travel or surgeries, no hemoptysis, no prior history of DVT or PE, she is not on OCPs or estrogen replacement therapy.  ROS: positive for shortness of breath, cough Negative for fever, chest pain  Physical Exam:   Gen: No distress  Neuro: Awake and Alert  Skin: Warm    Focused Exam: on 3 L via nasal cannula, 90 to 95%. Mildly tachypneic. Diminished breath sounds in the left middle lung field posteriorly. 2+ radial and DP/PT pulses bilaterally, no lower extremity edema. Tachycardic.  Initiation of care has begun. The patient has been counseled on the process, plan, and necessity for staying for the completion/evaluation, and the remainder of the medical screening examination    Debroah Baller 02/11/18 Natale Lay, MD 02/11/18 2141

## 2018-02-12 ENCOUNTER — Encounter (HOSPITAL_COMMUNITY): Payer: Self-pay | Admitting: General Practice

## 2018-02-12 LAB — BASIC METABOLIC PANEL
Anion gap: 12 (ref 5–15)
BUN: 17 mg/dL (ref 8–23)
CHLORIDE: 104 mmol/L (ref 98–111)
CO2: 26 mmol/L (ref 22–32)
Calcium: 9.7 mg/dL (ref 8.9–10.3)
Creatinine, Ser: 0.93 mg/dL (ref 0.44–1.00)
GFR calc Af Amer: >60 mL/min (ref >60)
GFR calc non Af Amer: >60 mL/min (ref >60)
GLUCOSE: 163 mg/dL — AB (ref 70–99)
POTASSIUM: 5.2 mmol/L — AB (ref 3.5–5.1)
Sodium: 142 mmol/L (ref 135–145)

## 2018-02-12 LAB — HIV ANTIBODY (ROUTINE TESTING W REFLEX): HIV Screen 4th Generation wRfx: NONREACTIVE

## 2018-02-12 MED ORDER — AZITHROMYCIN 250 MG PO TABS
250.0000 mg | ORAL_TABLET | Freq: Every day | ORAL | Status: DC
  Administered 2018-02-12 – 2018-02-13 (×2): 250 mg via ORAL
  Filled 2018-02-12 (×2): qty 1

## 2018-02-12 MED ORDER — SODIUM CHLORIDE 0.9 % IV SOLN: 250.0000 mL | INTRAVENOUS | Status: DC | PRN

## 2018-02-12 MED ORDER — IPRATROPIUM-ALBUTEROL 0.5-2.5 (3) MG/3ML IN SOLN
3.0000 mL | Freq: Two times a day (BID) | RESPIRATORY_TRACT | Status: DC
  Administered 2018-02-13: 3 mL via RESPIRATORY_TRACT
  Filled 2018-02-12: qty 3

## 2018-02-12 MED ORDER — ENOXAPARIN SODIUM 40 MG/0.4ML ~~LOC~~ SOLN
40.0000 mg | SUBCUTANEOUS | Status: DC
  Administered 2018-02-12 – 2018-02-13 (×2): 40 mg via SUBCUTANEOUS
  Filled 2018-02-12 (×2): qty 0.4

## 2018-02-12 MED ORDER — ACETAMINOPHEN 325 MG PO TABS: 650.0000 mg | ORAL_TABLET | Freq: Four times a day (QID) | ORAL | Status: DC | PRN

## 2018-02-12 MED ORDER — METHYLPREDNISOLONE SODIUM SUCC 125 MG IJ SOLR
60.0000 mg | Freq: Four times a day (QID) | INTRAMUSCULAR | Status: AC
  Administered 2018-02-12 – 2018-02-13 (×2): 60 mg via INTRAVENOUS
  Filled 2018-02-12 (×2): qty 2

## 2018-02-12 MED ORDER — ALBUTEROL SULFATE (2.5 MG/3ML) 0.083% IN NEBU: 2.5000 mg | INHALATION_SOLUTION | RESPIRATORY_TRACT | Status: DC | PRN

## 2018-02-12 MED ORDER — PREDNISONE 50 MG PO TABS
60.0000 mg | ORAL_TABLET | Freq: Every day | ORAL | Status: DC
  Administered 2018-02-13: 60 mg via ORAL
  Filled 2018-02-12: qty 1

## 2018-02-12 MED ORDER — METHYLPREDNISOLONE SODIUM SUCC 125 MG IJ SOLR
60.0000 mg | Freq: Four times a day (QID) | INTRAMUSCULAR | Status: DC
  Administered 2018-02-12 (×3): 60 mg via INTRAVENOUS
  Filled 2018-02-12 (×3): qty 2

## 2018-02-12 MED ORDER — ONDANSETRON HCL 4 MG PO TABS: 4.0000 mg | ORAL_TABLET | Freq: Four times a day (QID) | ORAL | Status: DC | PRN

## 2018-02-12 MED ORDER — GUAIFENESIN-DM 100-10 MG/5ML PO SYRP
5.0000 mL | ORAL_SOLUTION | ORAL | Status: DC | PRN
  Administered 2018-02-12 – 2018-02-13 (×2): 5 mL via ORAL
  Filled 2018-02-12 (×2): qty 5

## 2018-02-12 MED ORDER — SODIUM CHLORIDE 0.9% FLUSH
3.0000 mL | Freq: Two times a day (BID) | INTRAVENOUS | Status: DC
  Administered 2018-02-12 (×2): 3 mL via INTRAVENOUS
  Administered 2018-02-12: 10 mL via INTRAVENOUS

## 2018-02-12 MED ORDER — IPRATROPIUM-ALBUTEROL 0.5-2.5 (3) MG/3ML IN SOLN
3.0000 mL | Freq: Four times a day (QID) | RESPIRATORY_TRACT | Status: DC
  Administered 2018-02-12 (×3): 3 mL via RESPIRATORY_TRACT
  Filled 2018-02-12 (×3): qty 3

## 2018-02-12 MED ORDER — SODIUM CHLORIDE 0.9% FLUSH: 3.0000 mL | INTRAVENOUS | Status: DC | PRN

## 2018-02-12 MED ORDER — IPRATROPIUM BROMIDE 0.02 % IN SOLN: 0.5000 mg | Freq: Four times a day (QID) | RESPIRATORY_TRACT | Status: DC

## 2018-02-12 MED ORDER — ALBUTEROL SULFATE (2.5 MG/3ML) 0.083% IN NEBU: 2.5000 mg | INHALATION_SOLUTION | Freq: Four times a day (QID) | RESPIRATORY_TRACT | Status: DC

## 2018-02-12 MED ORDER — ACETAMINOPHEN 650 MG RE SUPP: 650.0000 mg | Freq: Four times a day (QID) | RECTAL | Status: DC | PRN

## 2018-02-12 MED ORDER — ONDANSETRON HCL 4 MG/2ML IJ SOLN: 4.0000 mg | Freq: Four times a day (QID) | INTRAMUSCULAR | Status: DC | PRN

## 2018-02-12 MED ORDER — HYDROCODONE-ACETAMINOPHEN 5-325 MG PO TABS: 1.0000 | ORAL_TABLET | ORAL | Status: DC | PRN

## 2018-02-12 NOTE — Progress Notes (Signed)
Patient ID: Julie Obrien, female   DOB: September 24, 1950, 67 y.o.   MRN: [id number]                                                                PROGRESS NOTE                                                                                                                                                                                                             Patient Demographics:    Julie Obrien, is a 67 y.o. female, DOB - 17-Jun-1951, RCV:[id number]  Admit date - 02/11/2018   Admitting Physician Merton Border, MD  Outpatient Primary MD for the patient is Glendale Chard, MD  LOS - 1  Outpatient Specialists:     Chief Complaint  Patient presents with  . Shortness of Breath  . Cough       Brief Narrative       Julie Obrien  is a 68 y.o. female, with past medical history significant for smoking presenting today with 2 days history of shortness of breath and cough.  Patient was treated for pneumonia by Z-Pak 2 days ago at the urgent care center and was started on prednisone p.o.  Patient denies chest pains or palpitations.  Patient presented today with hypoxemia and chest x-ray showed improvement in her pneumonia but the patient continued to be hypoxemic especially on exercise.  Patient is admitted for possible new oxygen requirements.  Reports chills but no documented fever     Subjective:    Julie Obrien today states that her breathing is better.  Slight dry cough. Afebrile overnite.  No headache, No chest pain, No abdominal pain - No Nausea, No new weakness tingling or numbness   Assessment  & Plan :    Active Problems:   COPD exacerbation (HCC)   Copd exacerbation Pneumonia  Continue solumedrol 60mg  iv q6h  Convert to prednisone 60mg  po qday 7/3 AM Cont duoneb q6h and prn Cont zithromax 250mg  po qday    Code Status :  FULL CODE  Family Communication  : w patient  Disposition Plan  :   Home in 1-2 days  Barriers For Discharge :   Consults  :   none  Procedures  :   DVT Prophylaxis  :  Lovenox -  SCDs  Lab Results  Component Value Date   PLT 365 02/11/2018    Antibiotics  :  zithromax 7/1=>  Anti-infectives (From admission, onward)   Start     Dose/Rate Route Frequency Ordered Stop   02/12/18 1000  azithromycin (ZITHROMAX) tablet 250 mg    Note to Pharmacy:  Take first 2 tablets together, then 1 every day until finished.     250 mg Oral Daily 02/12/18 0216     02/11/18 2300  azithromycin (ZITHROMAX) tablet 250 mg  Status:  Discontinued     250 mg Oral  Once 02/11/18 2251 02/11/18 2254        Objective:   Vitals:   02/12/18 0126 02/12/18 0217 02/12/18 0457 02/12/18 0852  BP: (!) 115/59 132/74 109/89   Pulse: 85 84 78 90  Resp: 18 16 17 20   Temp: 98.5 F (36.9 C) (!) 97.4 F (36.3 C) 98.8 F (37.1 C)   TempSrc: Oral Oral Oral   SpO2: 95% 95% 98% 91%  Weight:      Height:        Wt Readings from Last 3 Encounters:  02/11/18 81.6 kg (180 lb)  07/19/17 82.6 kg (182 lb)  05/09/16 84.4 kg (186 lb)     Intake/Output Summary (Last 24 hours) at 02/12/2018 1021 Last data filed at 02/12/2018 1017 Gross per 24 hour  Intake 778 ml  Output -  Net 778 ml     Physical Exam  Awake Alert, Oriented X 3, No new F.N deficits, Normal affect [place].AT,PERRAL Supple Neck,No JVD, No cervical lymphadenopathy appriciated.  Symmetrical Chest wall movement, Good air movement bilaterally,  Slight bilateral wheezing, no crackles.  RRR,No Gallops,Rubs or new Murmurs, No Parasternal Heave +ve B.Sounds, Abd Soft, No tenderness, No organomegaly appriciated, No rebound - guarding or rigidity. No Cyanosis, Clubbing or edema, No new Rash or bruise       Data Review:    CBC Recent Labs  Lab 02/11/18 1857  WBC 19.5*  HGB 13.0  HCT 40.7  PLT 365  MCV 82.4  MCH 26.3  MCHC 31.9  RDW 15.2  LYMPHSABS 2.0  MONOABS 0.9  EOSABS 0.0  BASOSABS 0.0    Chemistries  Recent Labs  Lab 02/11/18 1857 02/12/18 0234  NA 144 142  K  4.5 5.2*  CL 106 104  CO2 25 26  GLUCOSE 142* 163*  BUN 16 17  CREATININE 0.89 0.93  CALCIUM 9.8 9.7  AST 33  --   ALT 16  --   ALKPHOS 83  --   BILITOT 0.4  --    ------------------------------------------------------------------------------------------------------------------ No results for input(s): CHOL, HDL, LDLCALC, TRIG, CHOLHDL, LDLDIRECT in the last 72 hours.  No results found for: HGBA1C ------------------------------------------------------------------------------------------------------------------ No results for input(s): TSH, T4TOTAL, T3FREE, THYROIDAB in the last 72 hours.  Invalid input(s): FREET3 ------------------------------------------------------------------------------------------------------------------ No results for input(s): VITAMINB12, FOLATE, FERRITIN, TIBC, IRON, RETICCTPCT in the last 72 hours.  Coagulation profile No results for input(s): INR, PROTIME in the last 168 hours.  No results for input(s): DDIMER in the last 72 hours.  Cardiac Enzymes No results for input(s): CKMB, TROPONINI, MYOGLOBIN in the last 168 hours.  Invalid input(s): CK ------------------------------------------------------------------------------------------------------------------ No results found for: BNP  Inpatient Medications  Scheduled Meds: . azithromycin  250 mg Oral Daily  . enoxaparin (LOVENOX) injection  40 mg Subcutaneous Q24H  . ipratropium-albuterol  3 mL Nebulization Q6H  . methylPREDNISolone (SOLU-MEDROL) injection  60 mg Intravenous Q6H  . sodium chloride flush  3 mL Intravenous Q12H   Continuous Infusions: . sodium chloride     PRN Meds:.sodium chloride, acetaminophen **OR** acetaminophen, albuterol, guaiFENesin-dextromethorphan, HYDROcodone-acetaminophen, ondansetron **OR** ondansetron (ZOFRAN) IV, sodium chloride flush  Micro Results No results found for this or any previous visit (from the past 240 hour(s)).  Radiology Reports Dg Chest 2  View  Result Date: 02/11/2018 CLINICAL DATA:  Shortness of breath and productive cough EXAM: CHEST - 2 VIEW COMPARISON:  02/10/2018, 07/20/2017 FINDINGS: Moderate bronchitic changes. No acute consolidation or effusion. Stable cardiomediastinal silhouette. No pneumothorax. Partially visualized surgical hardware in the spine. IMPRESSION: Moderate bronchitic changes without focal pulmonary infiltrate. Electronically Signed   By: Donavan Foil M.D.   On: 02/11/2018 19:24   Dg Chest 2 View  Result Date: 02/10/2018 CLINICAL DATA:  Cough, wheezing and shortness of breath for 1 day. EXAM: CHEST - 2 VIEW COMPARISON:  CT of the chest 07/20/2017 FINDINGS: Cardiomediastinal silhouette is normal. Mediastinal contours appear intact. There is no evidence of pleural effusion or pneumothorax. Mild peribronchial thickening which may be seen with bronchitis or reactive airway disease. Atelectasis versus airspace consolidation in the left middle lobe. Osseous structures are without acute abnormality. Soft tissues are grossly normal. IMPRESSION: Mild peribronchial thickening which may be seen with bronchitis or reactive airway disease. Focal area of atelectasis versus airspace consolidation in the left middle lobe. Electronically Signed   By: Fidela Salisbury M.D.   On: 02/10/2018 16:08    Time Spent in minutes  30   Jani Gravel M.D on 02/12/2018 at 10:21 AM  Between 7am to 7pm - Pager - [phone]    After 7pm go to [email or web address] - password [email or web address]  [facility] -  Office  [phone]

## 2018-02-13 DIAGNOSIS — J181 Lobar pneumonia, unspecified organism: Secondary | ICD-10-CM

## 2018-02-13 DIAGNOSIS — J441 Chronic obstructive pulmonary disease with (acute) exacerbation: Principal | ICD-10-CM

## 2018-02-13 DIAGNOSIS — F17200 Nicotine dependence, unspecified, uncomplicated: Secondary | ICD-10-CM

## 2018-02-13 LAB — COMPREHENSIVE METABOLIC PANEL
ALBUMIN: 3.1 g/dL — AB (ref 3.5–5.0)
ALK PHOS: 74 U/L (ref 38–126)
ALT: 19 U/L (ref 0–44)
ANION GAP: 8 (ref 5–15)
AST: 39 U/L (ref 15–41)
BUN: 20 mg/dL (ref 8–23)
CHLORIDE: 105 mmol/L (ref 98–111)
CO2: 26 mmol/L (ref 22–32)
Calcium: 9 mg/dL (ref 8.9–10.3)
Creatinine, Ser: 0.79 mg/dL (ref 0.44–1.00)
GFR calc Af Amer: >60 mL/min (ref >60)
GFR calc non Af Amer: >60 mL/min (ref >60)
GLUCOSE: 155 mg/dL — AB (ref 70–99)
POTASSIUM: 4.2 mmol/L (ref 3.5–5.1)
SODIUM: 139 mmol/L (ref 135–145)
Total Bilirubin: 0.3 mg/dL (ref 0.3–1.2)
Total Protein: 6.2 g/dL — ABNORMAL LOW (ref 6.5–8.1)

## 2018-02-13 LAB — CBC
HCT: 38.2 % (ref 36.0–46.0)
HEMOGLOBIN: 11.9 g/dL — AB (ref 12.0–15.0)
MCH: 25.9 pg — ABNORMAL LOW (ref 26.0–34.0)
MCHC: 31.2 g/dL (ref 30.0–36.0)
MCV: 83.2 fL (ref 78.0–100.0)
PLATELETS: 361 10*3/uL (ref 150–400)
RBC: 4.59 MIL/uL (ref 3.87–5.11)
RDW: 15 % (ref 11.5–15.5)
WBC: 17.9 10*3/uL — ABNORMAL HIGH (ref 4.0–10.5)

## 2018-02-13 MED ORDER — PREDNISONE 10 MG PO TABS: ORAL_TABLET | ORAL | 0 refills | Status: AC

## 2018-02-13 MED ORDER — ALBUTEROL SULFATE HFA 108 (90 BASE) MCG/ACT IN AERS: 1.0000 | INHALATION_SPRAY | RESPIRATORY_TRACT | 3 refills | Status: DC | PRN

## 2018-02-13 NOTE — Plan of Care (Signed)
  Problem: Clinical Measurements: Goal: Respiratory complications will improve Outcome: Not Progressing. Incentive Spirometer given will instruct in am

## 2018-02-13 NOTE — Progress Notes (Signed)
SATURATION QUALIFICATIONS: (This note is used to comply with regulatory documentation for home oxygen)  Patient Saturations on Room Air at Rest = 95%  Patient Saturations on Room Air while Ambulating = 90%   

## 2018-02-13 NOTE — Progress Notes (Signed)
RT note: RT instructed patient with use of fluuer vale using teach back method. Patient had good effort and understanding.

## 2018-02-13 NOTE — Discharge Summary (Signed)
Physician Discharge Summary  [name] YBO:[id number] DOB: 1951-04-24 DOA: 02/11/2018  PCP: Glendale Chard, MD  Admit date: 02/11/2018 Discharge date: 02/13/2018  Admitted From: Home  Disposition:  Home   Recommendations for Outpatient Follow-up:  1. Follow up with PCP in 1 week 2. Please repeat CXR in 3-4 weeks to document resolution of infiltrate 3. Please follow up on smoking cessation and referral for PFTs if still dyspnea or wheezing after resolution of current episode       Home Health: None  Equipment/Devices: None  Discharge Condition: Good  CODE STATUS: FULL Diet recommendation: Regular  Brief/Interim Summary: Julie Obrien is a 67 y.o. F with history of smoking, back pain/DDD who presents with several days dyspnea on exertion.  Patient had developed persistent cough for several weeks before admission.  Few days before admission, was seen in ER for increased SOB, sinus drainage, fatigue.  CXR showed LLL pneumonia, started on Z-pak.  Her SOB worsened and so she went to PCP where she was found to be hypoxic, sent to the ER.     Discharge Diagnoses:  COPD exacerbation Very wheezy on admission, SOB, hypoxic.  Treated with IV steroids and nebulized bronchodilators.  Steroids weaned to prednisone, oxygen weaned off.  Discharged with six day prednisone taper, short-term PCP follow up.  If persistently SOB, recommend PFTs.   Community-acquired pneumonia left middle lobe Azithromycin continued, to complete 5 days.    Smoking  Patient interested to quit.  Smoking cessation reocmmended.  Modailities discussed.         Discharge Instructions  Discharge Instructions    Diet general   Complete by:  As directed    Discharge instructions   Complete by:  As directed    From Dr. Loleta Books: You were admitted with pneumonia and what appears to be a COPD exacerbation. Pneumonias often cause COPD to flare up.  For the pneumonia, finish the last 3 doses of your Z-pak  (azithromycin 250 mg once daily in the morning)  For the COPD exacerbation, take prednisone according to the following taper: Take prednisone 40 mg once daily in the morning for 2 days, then Take prednisone 20 mg once daily in the morning for 2 days, then Take prednisone 10 mg once daily in the morning for 2 days then stop.  I have provided a prescription for additional prednisone, in case you don't have enough from the previous prescription.  I have also provided a prescription for albuterol, which is the breathing treatment.  Use this as needed, up to every 2-4 hours.    Follow up with Dr. Baird Cancer in the next week for a hospital follow up appointment.  Discuss with her the possibility of doing lung function tests.  Good luck with quitting smoking!!!  You can do it!   Increase activity slowly   Complete by:  As directed      Allergies as of 02/13/2018      Reactions   Augmentin [amoxicillin-pot Clavulanate] Nausea And Vomiting   "Tore up my stomach" (verified this with her daughter, a physician)   Sulfa Antibiotics Hives      Medication List    STOP taking these medications   meloxicam 7.5 MG tablet Commonly known as:  MOBIC     TAKE these medications   albuterol 108 (90 Base) MCG/ACT inhaler Commonly known as:  VENTOLIN HFA Inhale 1-2 puffs into the lungs every 4 (four) hours as needed for wheezing or shortness of breath. What changed:  how much to take  when to take this   azithromycin 250 MG tablet Commonly known as:  ZITHROMAX Take 1 tablet (250 mg total) by mouth daily. Take first 2 tablets together, then 1 every day until finished.   ibuprofen 800 MG tablet Commonly known as:  ADVIL,MOTRIN Take 800 mg by mouth every 8 (eight) hours as needed (for pain).   naproxen sodium 220 MG tablet Commonly known as:  ALEVE Take 220-440 mg by mouth 2 (two) times daily as needed (for pain or headaches).   predniSONE 10 MG tablet Commonly known as:  DELTASONE Take 4  tablets (40 mg total) by mouth daily with breakfast for 2 days, THEN 2 tablets (20 mg total) daily with breakfast for 2 days, THEN 1 tablet (10 mg total) daily with breakfast for 2 days. Start taking on:  02/14/2018 What changed:  See the new instructions.       Allergies  Allergen Reactions  . Augmentin [Amoxicillin-Pot Clavulanate] Nausea And Vomiting    "Tore up my stomach" (verified this with her daughter, a physician)  . Sulfa Antibiotics Hives    Consultations:  None   Procedures/Studies: Dg Chest 2 View  Result Date: 02/11/2018 CLINICAL DATA:  Shortness of breath and productive cough EXAM: CHEST - 2 VIEW COMPARISON:  02/10/2018, 07/20/2017 FINDINGS: Moderate bronchitic changes. No acute consolidation or effusion. Stable cardiomediastinal silhouette. No pneumothorax. Partially visualized surgical hardware in the spine. IMPRESSION: Moderate bronchitic changes without focal pulmonary infiltrate. Electronically Signed   By: Donavan Foil M.D.   On: 02/11/2018 19:24   Dg Chest 2 View  Result Date: 02/10/2018 CLINICAL DATA:  Cough, wheezing and shortness of breath for 1 day. EXAM: CHEST - 2 VIEW COMPARISON:  CT of the chest 07/20/2017 FINDINGS: Cardiomediastinal silhouette is normal. Mediastinal contours appear intact. There is no evidence of pleural effusion or pneumothorax. Mild peribronchial thickening which may be seen with bronchitis or reactive airway disease. Atelectasis versus airspace consolidation in the left middle lobe. Osseous structures are without acute abnormality. Soft tissues are grossly normal. IMPRESSION: Mild peribronchial thickening which may be seen with bronchitis or reactive airway disease. Focal area of atelectasis versus airspace consolidation in the left middle lobe. Electronically Signed   By: Fidela Salisbury M.D.   On: 02/10/2018 16:08       Subjective: Feels good today.  No SOB with exertion, no fever, no vomiting, no confusion.  Discharge  Exam: Vitals:   02/13/18 0838 02/13/18 1350  BP:  140/68  Pulse:  85  Resp:  16  Temp:  98.7 F (37.1 C)  SpO2: 91% 92%   Vitals:   02/12/18 2120 02/13/18 0519 02/13/18 0838 02/13/18 1350  BP:  (!) 106/57  140/68  Pulse: 84 73  85  Resp: 18 18  16   Temp:  98.4 F (36.9 C)  98.7 F (37.1 C)  TempSrc:  Oral  Oral  SpO2: 92% 92% 91% 92%  Weight:      Height:        General: Pt is alert, awake, not in acute distress Cardiovascular: RRR, S1/S2 +, no rubs, no gallops Respiratory: Wheezing bil.aterally, no respiratory distress. Abdominal: Soft, NT, ND, bowel sounds + Extremities: no edema, no cyanosis    The results of significant diagnostics from this hospitalization (including imaging, microbiology, ancillary and laboratory) are listed below for reference.     Microbiology: No results found for this or any previous visit (from the past 240 hour(s)).   Labs: BNP (last  3 results) No results for input(s): BNP in the last 8760 hours. Basic Metabolic Panel: Recent Labs  Lab 02/11/18 1857 02/12/18 0234 02/13/18 0625  NA 144 142 139  K 4.5 5.2* 4.2  CL 106 104 105  CO2 25 26 26   GLUCOSE 142* 163* 155*  BUN 16 17 20   CREATININE 0.89 0.93 0.79  CALCIUM 9.8 9.7 9.0   Liver Function Tests: Recent Labs  Lab 02/11/18 1857 02/13/18 0625  AST 33 39  ALT 16 19  ALKPHOS 83 74  BILITOT 0.4 0.3  PROT 7.1 6.2*  ALBUMIN 3.6 3.1*   No results for input(s): LIPASE, AMYLASE in the last 168 hours. No results for input(s): AMMONIA in the last 168 hours. CBC: Recent Labs  Lab 02/11/18 1857 02/13/18 0625  WBC 19.5* 17.9*  NEUTROABS 16.5*  --   HGB 13.0 11.9*  HCT 40.7 38.2  MCV 82.4 83.2  PLT 365 361   Cardiac Enzymes: No results for input(s): CKTOTAL, CKMB, CKMBINDEX, TROPONINI in the last 168 hours. BNP: Invalid input(s): POCBNP CBG: No results for input(s): GLUCAP in the last 168 hours. D-Dimer No results for input(s): DDIMER in the last 72 hours. Hgb  A1c No results for input(s): HGBA1C in the last 72 hours. Lipid Profile No results for input(s): CHOL, HDL, LDLCALC, TRIG, CHOLHDL, LDLDIRECT in the last 72 hours. Thyroid function studies No results for input(s): TSH, T4TOTAL, T3FREE, THYROIDAB in the last 72 hours.  Invalid input(s): FREET3 Anemia work up No results for input(s): VITAMINB12, FOLATE, FERRITIN, TIBC, IRON, RETICCTPCT in the last 72 hours. Urinalysis    Component Value Date/Time   COLORURINE YELLOW 02/11/2018 2002   APPEARANCEUR HAZY (A) 02/11/2018 2002   LABSPEC 1.019 02/11/2018 2002   PHURINE 5.0 02/11/2018 2002   GLUCOSEU NEGATIVE 02/11/2018 2002   HGBUR MODERATE (A) 02/11/2018 2002   BILIRUBINUR NEGATIVE 02/11/2018 2002   [place] 02/11/2018 2002   PROTEINUR 30 (A) 02/11/2018 2002   UROBILINOGEN 0.2 06/01/2015 0820   NITRITE NEGATIVE 02/11/2018 2002   LEUKOCYTESUR MODERATE (A) 02/11/2018 2002   Sepsis Labs Invalid input(s): PROCALCITONIN,  WBC,  LACTICIDVEN Microbiology No results found for this or any previous visit (from the past 240 hour(s)).   Time coordinating discharge: 35 minutes       SIGNED:   Edwin Dada, MD  [facility] 02/13/2018, 8:16 PM

## 2018-03-13 DIAGNOSIS — Z720 Tobacco use: Secondary | ICD-10-CM | POA: Diagnosis not present

## 2018-03-13 DIAGNOSIS — Z0000 Encounter for general adult medical examination without abnormal findings: Secondary | ICD-10-CM | POA: Diagnosis not present

## 2018-03-13 DIAGNOSIS — Z1231 Encounter for screening mammogram for malignant neoplasm of breast: Secondary | ICD-10-CM | POA: Diagnosis not present

## 2018-03-20 DIAGNOSIS — M461 Sacroiliitis, not elsewhere classified: Secondary | ICD-10-CM | POA: Diagnosis not present

## 2018-03-25 DIAGNOSIS — Z1231 Encounter for screening mammogram for malignant neoplasm of breast: Secondary | ICD-10-CM | POA: Diagnosis not present

## 2018-03-25 IMAGING — CT CT CERVICAL SPINE W/O CM
2 series · 10 of 14 positions shown, 12 images · non-contrast
Comparison: CT cervical spine 09/07/2016

CLINICAL DATA: Cervicalgia.  Neck pain left shoulder pain.

EXAM:
CT CERVICAL SPINE WITHOUT CONTRAST
TECHNIQUE: Multidetector CT imaging of the cervical spine was performed without
intravenous contrast. Multiplanar CT image reconstructions were also
generated.

[Series 3: cspine soft · axial · 0.35mm/px · z∈[-183,-75]mm · 5 of 82 slices shown]
[im 14/82  soft-tissue]
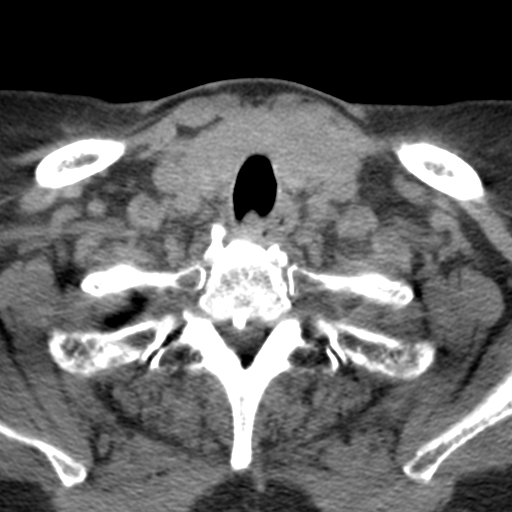
[im 28/82  soft-tissue]
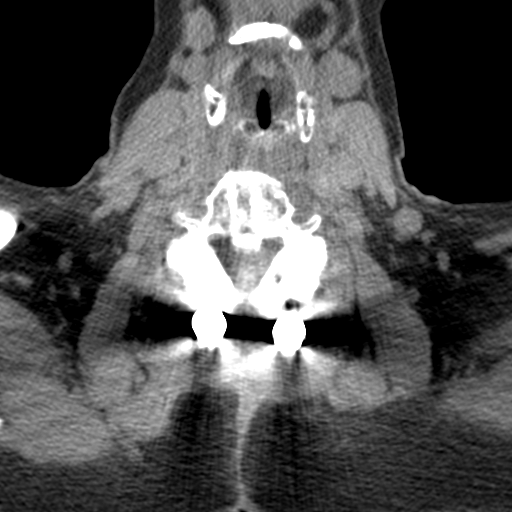
[im 41/82  soft-tissue]
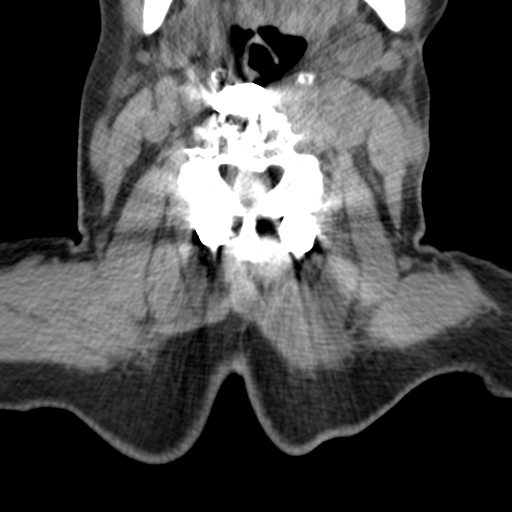
[im 55/82  soft-tissue]
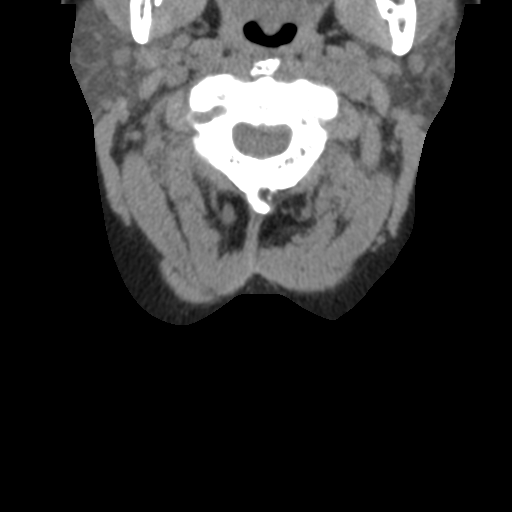
[im 68/82  soft-tissue]
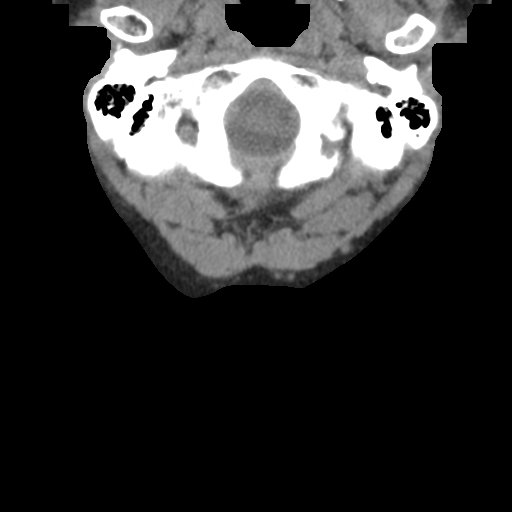

[Series 9: angled axial · axial · 0.29mm/px · z∈[-207,-107]mm · 5 of 93 slices shown, 7 images]
[im 16/93  soft-tissue]
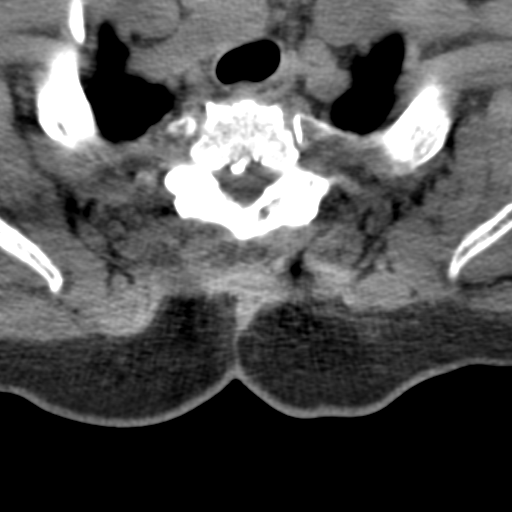
[im 16/93  bone]
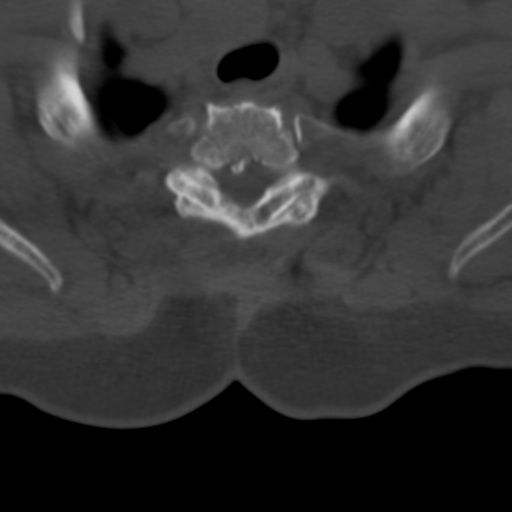
[im 31/93  bone]
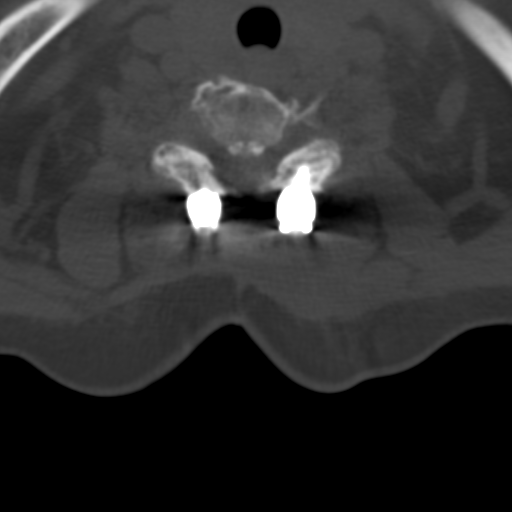
[im 47/93  bone]
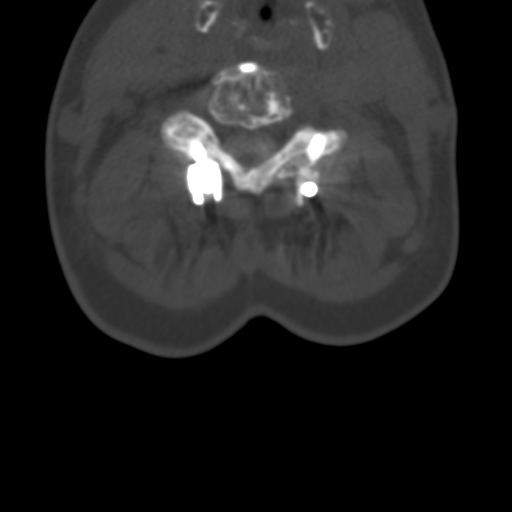
[im 62/93  bone]
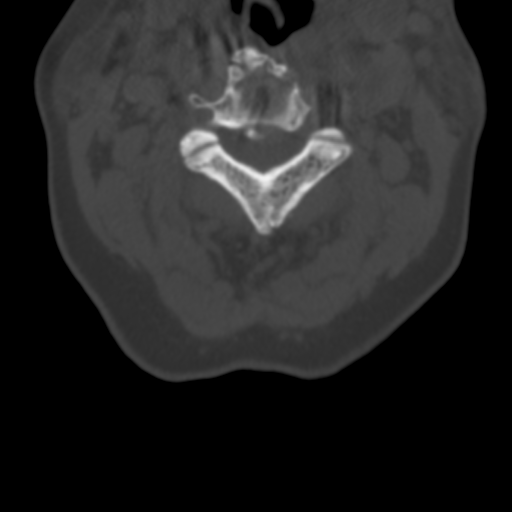
[im 77/93  soft-tissue]
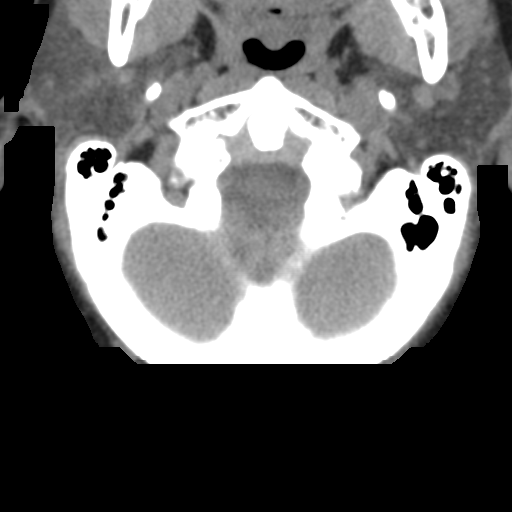
[im 77/93  bone]
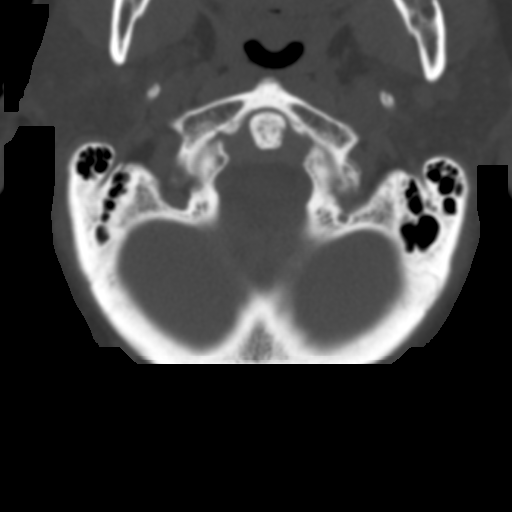

[10 of 14 positions shown; findings below may reference images not displayed]

FINDINGS: Alignment: Normal

Skull base and vertebrae: Negative for fracture.

Soft tissues and spinal canal: Negative for mass or adenopathy

Disc levels: C2-3: Disc degeneration with posterior spurring. Mild
spinal stenosis, unchanged

C3-4: ACDF with solid interbody fusion. Left C3 screws fractured and
unchanged. Posterior decompression. Posterior hardware fusion
unchanged in position

C4-5: Solid interbody fusion without stenosis

C5-6: Solid interbody fusion without stenosis

C6-7: Ossification posterior longitudinal ligament with posterior
decompression. No significant stenosis and no interval change

C7-T1: Ossification posterior longitudinal ligament. Posterior
decompression. Mild foraminal narrowing bilaterally.

T1-2: Bilateral facet degeneration causing foraminal narrowing
bilaterally.

Upper chest: Negative

Other: None
IMPRESSION: Anterior and posterior fusion at C3-4 without stenosis. Fracture of
the left L3 screws unchanged.

Solid fusion at C4-5 and C5-6. Posterior decompression at these
levels without stenosis

Posterior fusion at C6-7 with posterior decompression. Ossification
of the posterior longitudinal ligament.

No change from the prior CT.

## 2018-03-25 IMAGING — CT CT T SPINE W/O CM
1 series · 12 of 14 positions shown, 15 images · non-contrast
Comparison: CT chest 07/20/2017

CLINICAL DATA: Acute back pain

EXAM:
CT THORACIC SPINE WITHOUT CONTRAST
TECHNIQUE: Multidetector CT images of the thoracic were obtained using the
standard protocol without intravenous contrast.

[Series 3: t spine soft · axial · 0.34mm/px · z∈[-280,-34]mm · 12 of 98 slices shown, 15 images]
[im 8/98  soft-tissue]
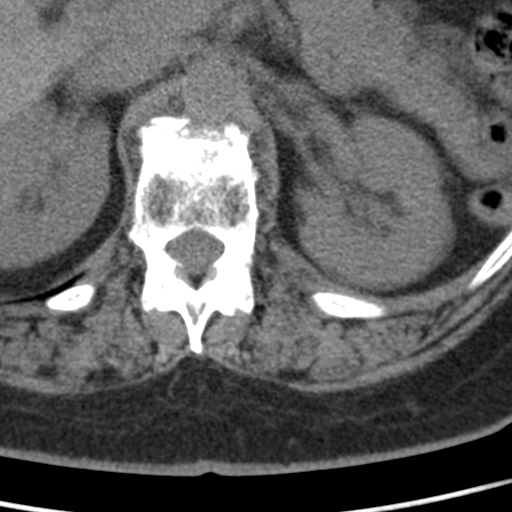
[im 8/98  bone]
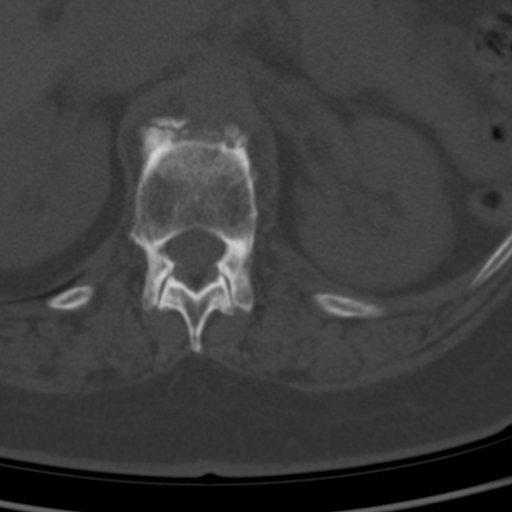
[im 15/98  bone]
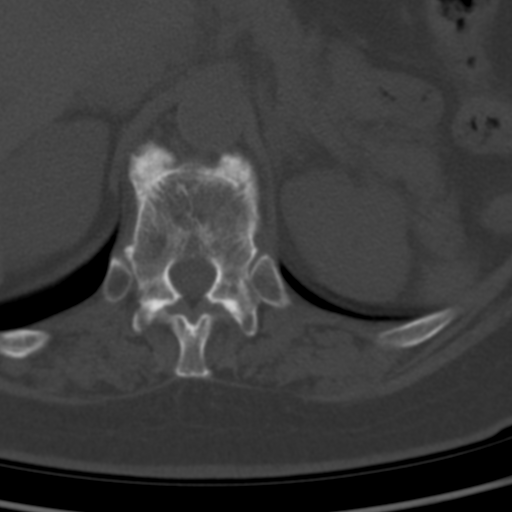
[im 23/98  bone]
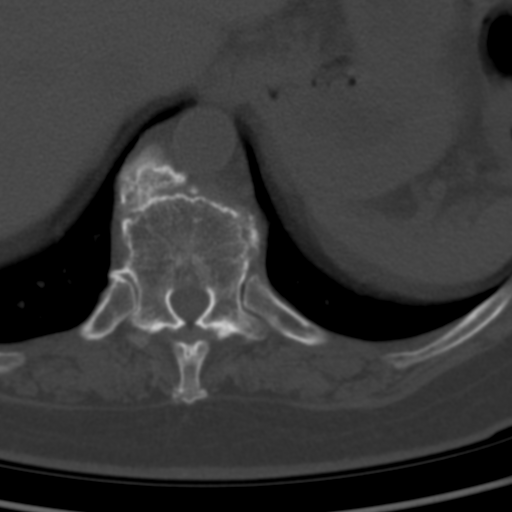
[im 30/98  bone]
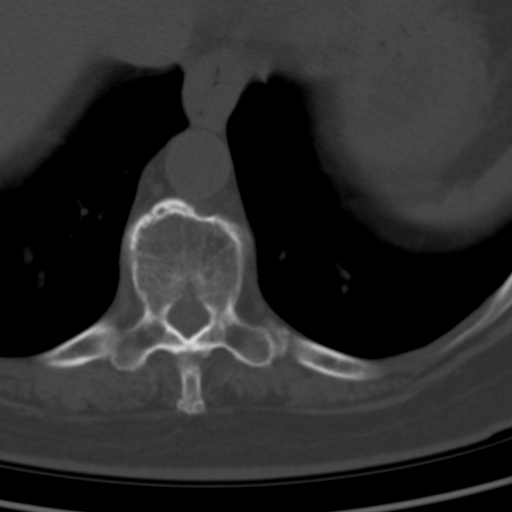
[im 38/98  soft-tissue]
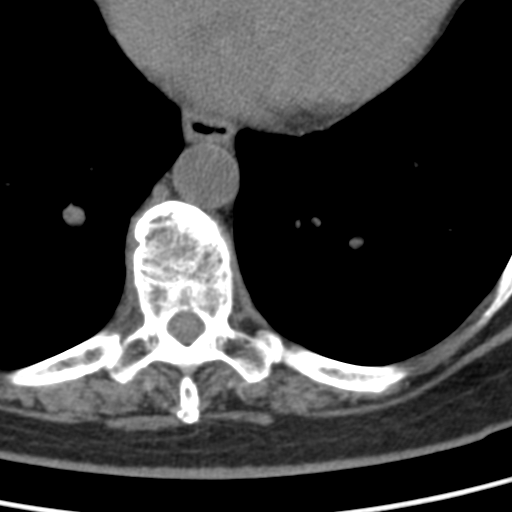
[im 38/98  bone]
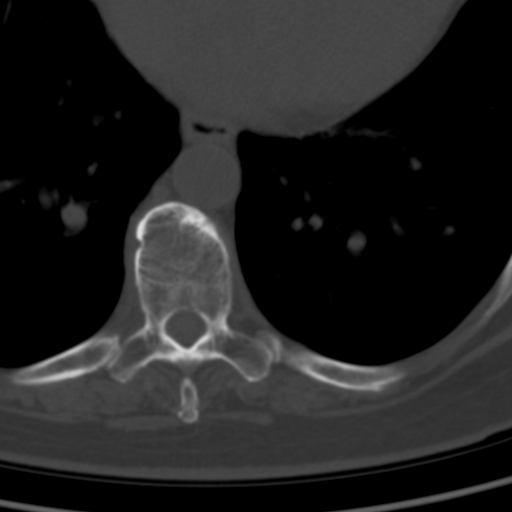
[im 45/98  bone]
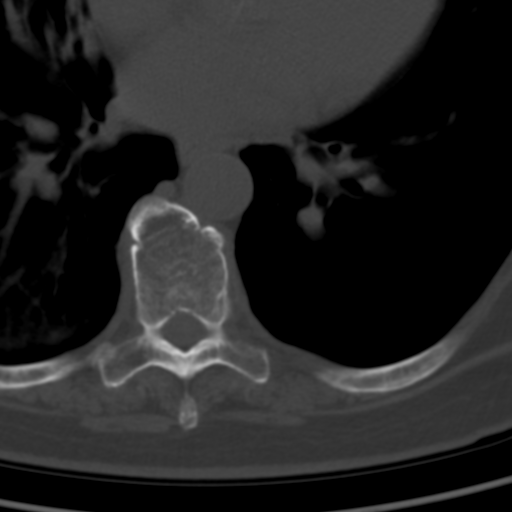
[im 53/98  bone]
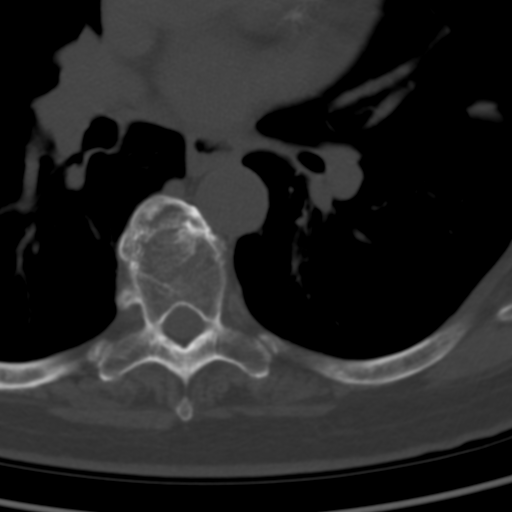
[im 60/98  bone]
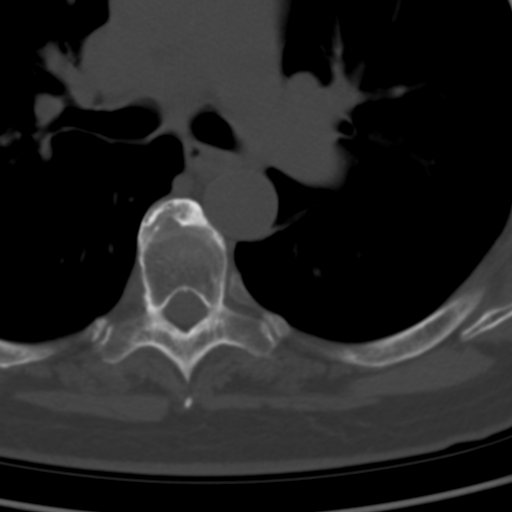
[im 68/98  soft-tissue]
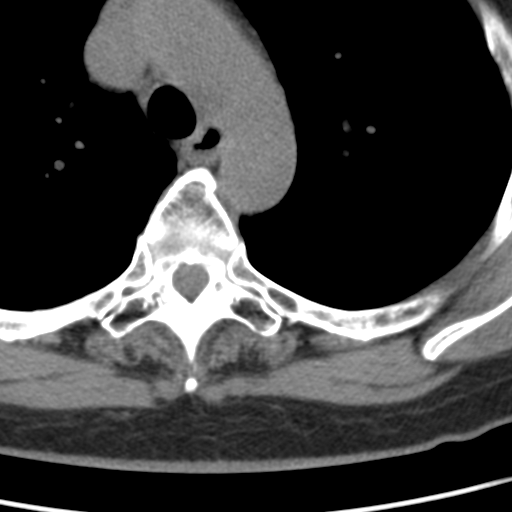
[im 68/98  bone]
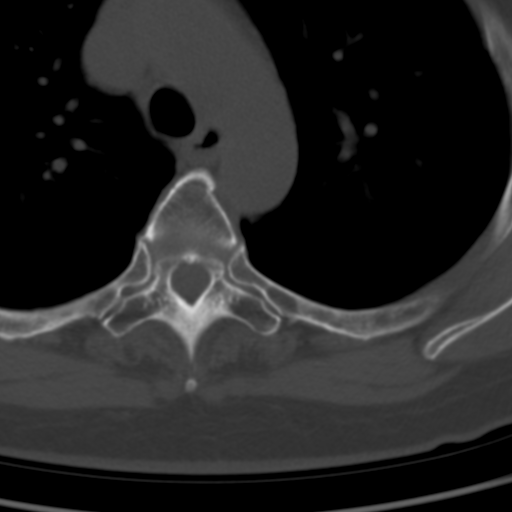
[im 75/98  bone]
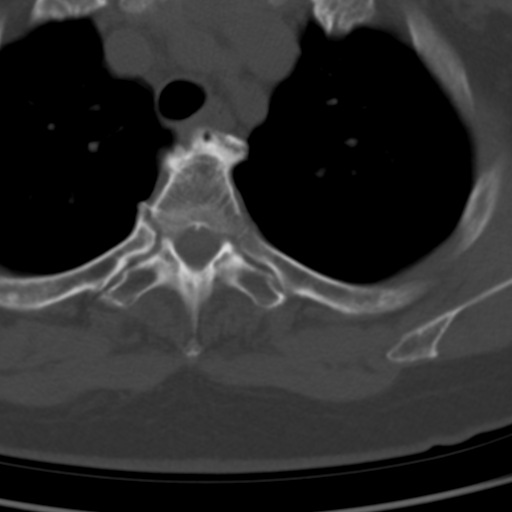
[im 83/98  bone]
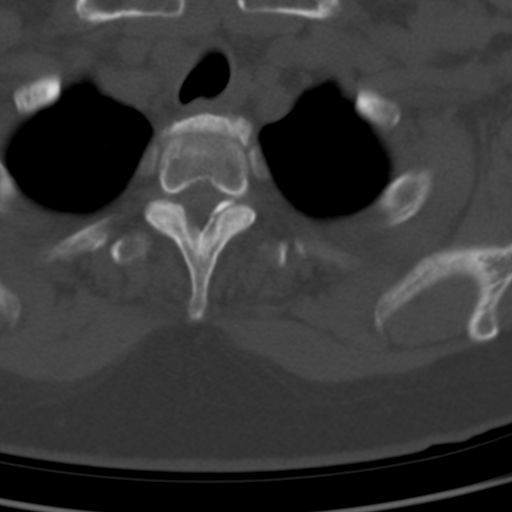
[im 90/98  bone]
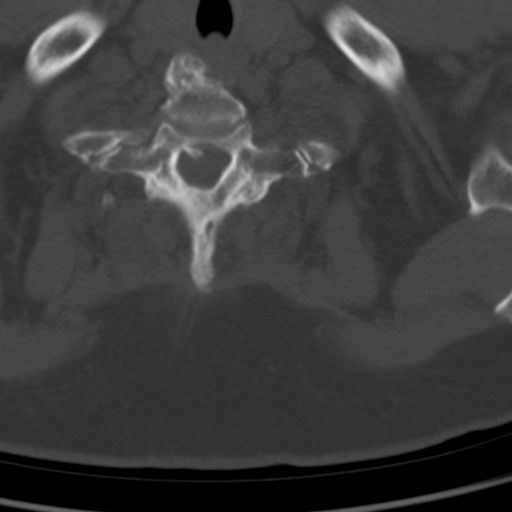

[12 of 14 positions shown; findings below may reference images not displayed]

FINDINGS: Alignment: Normal

Vertebrae: Negative for fracture. Benign appearing 5 mm sclerotic
focus T10 vertebral body unchanged.

Paraspinal and other soft tissues: Negative for mass or fluid
collection. No pleural effusion. Visualized lungs are clear

Disc levels: Extensive anterior osteophytes throughout the thoracic
spine, right greater than left. Osteophytes are diffusely fused
anteriorly.

T1-2: Ossification posterior longitudinal ligament extending down
from the cervical spine and contributing to mild spinal stenosis.
Bilateral facet degeneration. Bilateral foraminal stenosis.

T2-3: Disc and facet degeneration with mild spinal stenosis. Mild
foraminal narrowing bilaterally

T3-4: Small central calcified disc. Bilateral facet degeneration.
Moderate left foraminal stenosis.

T4-5: Severe right foraminal encroachment due to spurring and facet
hypertrophy.

T5-6: Bilateral facet hypertrophy with moderate foraminal stenosis
bilaterally.

T6-7: Mild disc and facet degeneration

T7-8: Mild disc and facet degeneration. Left foraminal narrowing due
to spurring

T8-9: Mild disc and facet degeneration

T9-10: Mild disc and facet degeneration

T10-11: Mild disc and mild facet degeneration without significant
stenosis

T11-12: Mild disc and facet degeneration without stenosis

T12-L1: Small central osteophyte without significant stenosis
IMPRESSION: Negative for fracture and no change from recent chest CT

Multilevel disc and facet degeneration throughout the cervical spine
with spinal and foraminal stenosis as described above.

## 2018-03-28 DIAGNOSIS — M461 Sacroiliitis, not elsewhere classified: Secondary | ICD-10-CM | POA: Diagnosis not present

## 2018-03-28 DIAGNOSIS — M961 Postlaminectomy syndrome, not elsewhere classified: Secondary | ICD-10-CM | POA: Diagnosis not present

## 2018-03-28 DIAGNOSIS — M47816 Spondylosis without myelopathy or radiculopathy, lumbar region: Secondary | ICD-10-CM | POA: Diagnosis not present

## 2018-03-28 DIAGNOSIS — M7062 Trochanteric bursitis, left hip: Secondary | ICD-10-CM | POA: Diagnosis not present

## 2018-04-18 DIAGNOSIS — Z720 Tobacco use: Secondary | ICD-10-CM | POA: Diagnosis not present

## 2018-04-18 DIAGNOSIS — N390 Urinary tract infection, site not specified: Secondary | ICD-10-CM | POA: Diagnosis not present

## 2018-04-18 DIAGNOSIS — R7309 Other abnormal glucose: Secondary | ICD-10-CM | POA: Diagnosis not present

## 2018-04-18 DIAGNOSIS — R946 Abnormal results of thyroid function studies: Secondary | ICD-10-CM | POA: Diagnosis not present

## 2018-04-18 DIAGNOSIS — Z0000 Encounter for general adult medical examination without abnormal findings: Secondary | ICD-10-CM | POA: Diagnosis not present

## 2018-04-18 DIAGNOSIS — E559 Vitamin D deficiency, unspecified: Secondary | ICD-10-CM | POA: Diagnosis not present

## 2018-04-18 DIAGNOSIS — E785 Hyperlipidemia, unspecified: Secondary | ICD-10-CM | POA: Diagnosis not present

## 2018-05-28 DIAGNOSIS — M961 Postlaminectomy syndrome, not elsewhere classified: Secondary | ICD-10-CM | POA: Diagnosis not present

## 2018-05-28 DIAGNOSIS — M7061 Trochanteric bursitis, right hip: Secondary | ICD-10-CM | POA: Diagnosis not present

## 2018-05-28 DIAGNOSIS — M47816 Spondylosis without myelopathy or radiculopathy, lumbar region: Secondary | ICD-10-CM | POA: Diagnosis not present

## 2018-05-28 DIAGNOSIS — M461 Sacroiliitis, not elsewhere classified: Secondary | ICD-10-CM | POA: Diagnosis not present

## 2018-06-18 DIAGNOSIS — M7061 Trochanteric bursitis, right hip: Secondary | ICD-10-CM | POA: Diagnosis not present

## 2018-06-18 DIAGNOSIS — M461 Sacroiliitis, not elsewhere classified: Secondary | ICD-10-CM | POA: Diagnosis not present

## 2018-06-18 DIAGNOSIS — M961 Postlaminectomy syndrome, not elsewhere classified: Secondary | ICD-10-CM | POA: Diagnosis not present

## 2018-06-18 DIAGNOSIS — M7918 Myalgia, other site: Secondary | ICD-10-CM | POA: Diagnosis not present

## 2018-06-18 DIAGNOSIS — M47816 Spondylosis without myelopathy or radiculopathy, lumbar region: Secondary | ICD-10-CM | POA: Diagnosis not present

## 2018-07-24 ENCOUNTER — Ambulatory Visit (INDEPENDENT_AMBULATORY_CARE_PROVIDER_SITE_OTHER): Payer: Medicare Other | Admitting: Internal Medicine

## 2018-07-24 ENCOUNTER — Ambulatory Visit
Admission: RE | Admit: 2018-07-24 | Discharge: 2018-07-24 | Disposition: A | Discharge status: Home / Self Care | Payer: Medicare Other | Source: Ambulatory Visit | Attending: Internal Medicine | Admitting: Internal Medicine

## 2018-07-24 ENCOUNTER — Encounter: Payer: Self-pay | Admitting: Internal Medicine

## 2018-07-24 ENCOUNTER — Other Ambulatory Visit: Payer: Self-pay | Admitting: Internal Medicine

## 2018-07-24 VITALS — BP 110/60 | HR 89 | Temp 97.7°F | Ht 64.0 in | Wt 193.2 lb

## 2018-07-24 DIAGNOSIS — R059 Cough, unspecified: Secondary | ICD-10-CM

## 2018-07-24 DIAGNOSIS — R05 Cough: Secondary | ICD-10-CM

## 2018-07-24 DIAGNOSIS — M62838 Other muscle spasm: Secondary | ICD-10-CM

## 2018-07-24 DIAGNOSIS — M549 Dorsalgia, unspecified: Secondary | ICD-10-CM

## 2018-07-24 MED ORDER — CEFDINIR 300 MG PO CAPS: 300.0000 mg | ORAL_CAPSULE | Freq: Two times a day (BID) | ORAL | 0 refills | Status: DC

## 2018-07-24 MED ORDER — KETOROLAC TROMETHAMINE 60 MG/2ML IM SOLN
30.0000 mg | Freq: Once | INTRAMUSCULAR | Status: AC
  Administered 2018-07-24: 30 mg via INTRAMUSCULAR

## 2018-07-24 NOTE — Patient Instructions (Signed)
  Use ice for 15-20 min every 2 hours today. Then starting tomorrow, use heat instead and do the stretches I taught you.    Muscle Cramps and Spasms Muscle cramps and spasms are when muscles tighten by themselves. They usually get better within minutes. Muscle cramps are painful. They are usually stronger and last longer than muscle spasms. Muscle spasms may or may not be painful. They can last a few seconds or much longer. Follow these instructions at home:  Drink enough fluid to keep your pee (urine) clear or pale yellow.  Massage, stretch, and relax the muscle.  If directed, apply heat to tight or tense muscles as often as told by your doctor. Use the heat source that your doctor recommends. ? Place a towel between your skin and the heat source. ? Leave the heat on for 20-30 minutes. ? Take off the heat if your skin turns bright red. This is especially important if you are unable to feel pain, heat, or cold. You may have a greater risk of getting burned.  If directed, put ice on the affected area. This may help if you are sore or have pain after a cramp or spasm. ? Put ice in a plastic bag. ? Place a towel between your skin and the bag. ? Leave the ice on for 20 minutes, 2-3 times a day.  Take over-the-counter and prescription medicines only as told by your doctor.  Pay attention to any changes in your symptoms. Contact a doctor if:  Your cramps or spasms get worse or happen more often.  Your cramps or spasms do not get better with time. This information is not intended to replace advice given to you by your health care provider. Make sure you discuss any questions you have with your health care provider. Document Released: 07/13/2008 Document Revised: 09/01/2015 Document Reviewed: 05/04/2015 Elsevier Interactive Patient Education  2018 [employer].

## 2018-07-24 NOTE — Progress Notes (Signed)
Subjective:     Patient ID: Julie Obrien , female    DOB: May 22, 1951 , 67 y.o.   MRN: [id number]   Chief Complaint  Patient presents with  . Arm Pain    C/O R arm pain and breast pain     HPI She had R shoulder pain when she went to bed and woke up in the middle of the am with pain radiating to her R breast. Tried to lay back down since the pain got worse and breathing would hurt her to took 2 tyle and went on the  Recliner. Woke up again at 3 am and 6 am with sharp pain in her R lateral chest wall. Around 7 am she had 3 watery BM's. No nausea or sweating. Pain level 9/10. She denies abdominal pain or injuring herself.   Past Medical History:  Diagnosis Date  . Arthritis    hnp, low back , knees - " all OA"  . Complication of anesthesia    nausea one surgery more recent  . History of kidney stones   . Pneumonia    Feb.- Mar. 2017, not hospitalized, but pending surgery was cancelled ,since then, pt. seen by PCP, told that it was cleared    . PONV (postoperative nausea and vomiting)      Family History  Problem Relation Age of Onset  . Dementia Mother   . Cancer Mother   . Cancer Sister   . Cancer Maternal Aunt      Current Outpatient Medications:  .  albuterol (VENTOLIN HFA) 108 (90 Base) MCG/ACT inhaler, Inhale 1-2 puffs into the lungs every 4 (four) hours as needed for wheezing or shortness of breath., Disp: 1 Inhaler, Rfl: 3 .  ibuprofen (ADVIL,MOTRIN) 800 MG tablet, Take 800 mg by mouth every 8 (eight) hours as needed (for pain). , Disp: , Rfl: 2 .  naproxen sodium (ALEVE) 220 MG tablet, Take 220-440 mg by mouth 2 (two) times daily as needed (for pain or headaches)., Disp: , Rfl:  .  Vitamin D, Ergocalciferol, (DRISDOL) 1.25 MG (50000 UT) CAPS capsule, TAKE ONE CAPSULE BY MOUTH 2 TIMES PER WEEK ON TUESDAYS AND FRIDAYS, Disp: 8 capsule, Rfl: 0   Allergies  Allergen Reactions  . Augmentin [Amoxicillin-Pot Clavulanate] Nausea And Vomiting    "Tore up my stomach"  (verified this with her daughter, a physician)  . Sulfa Antibiotics Hives    Review of Systems  Constitutional: Positive for chills and fatigue. Negative for diaphoresis and fever.  HENT: Positive for sore throat.        R ear a little sore and R ST right now  Respiratory: Positive for cough and wheezing.        Mild cough this am and was wheezing this am  Gastrointestinal: Positive for diarrhea. Negative for nausea and vomiting.  Musculoskeletal: Positive for arthralgias, neck pain and neck stiffness. Negative for gait problem.  Skin: Negative for rash and wound.  Neurological: Positive for numbness. Negative for weakness.       On occasion gets tingling of her R ring and little finger  Hematological: Negative for adenopathy. Does not bruise/bleed easily.    Today's Vitals   07/24/18 1037  BP: 110/60  Pulse: 89  Temp: 97.7 F (36.5 C)  TempSrc: Oral  SpO2: 97%  Weight: 193 lb 3.2 oz (87.6 kg)  Height: 5\' 4"  (1.626 m)   Body mass index is 33.16 kg/m.   Objective:  Physical Exam Vitals signs and nursing  note reviewed.  Constitutional:      General: She is in acute distress.  HENT:     Head: Normocephalic.     Right Ear: Tympanic membrane normal.     Left Ear: Tympanic membrane normal.     Nose: Nose normal.  Eyes:     General: No scleral icterus.    Conjunctiva/sclera: Conjunctivae normal.  Neck:     Musculoskeletal: Normal range of motion and neck supple.  Cardiovascular:     Rate and Rhythm: Normal rate and regular rhythm.     Heart sounds: No murmur.  Pulmonary:     Effort: Pulmonary effort is normal. No respiratory distress.     Breath sounds: Normal breath sounds. No stridor. No wheezing, rhonchi or rales.     Comments: Deep breaths provoke her pain. Has local tenderness on her upper pectoralis muscle, but not of the breast tissue.  Chest:     Chest wall: Tenderness present.  Abdominal:     General: Abdomen is flat. Bowel sounds are normal. There is  distension.     Palpations: There is no mass.     Tenderness: There is abdominal tenderness. There is no right CVA tenderness, left CVA tenderness, guarding or rebound.     Comments: Had mild tenderness on RUQ  Musculoskeletal:        General: Tenderness present. No swelling.     Right lower leg: No edema.     Left lower leg: No edema.     Comments: Has tense and tender R trapezius and R rhomboid.  R shoulder had mild tenderness on deltoid, but ROM was normal, and caused pain on the trapezius and subscapular region.   Skin:    General: Skin is warm and dry.     Findings: No rash.     Comments: Has a well healed scar from mid C spine to T2.   Neurological:     General: No focal deficit present.     Mental Status: She is alert and oriented to person, place, and time. Mental status is at baseline.     Motor: No weakness.     Coordination: Coordination normal.     Gait: Gait normal.     Deep Tendon Reflexes: Reflexes normal.     Comments: No hyperalgesia on area of pain.   Psychiatric:        Mood and Affect: Mood normal.        Behavior: Behavior normal.        Thought Content: Thought content normal.        Judgment: Judgment normal.    Assessment And Plan:    1. Upper back pain on right side- acute. - ketorolac (TORADOL) injection 30 mg - DG Chest 2 View; Future  2. Muscle spasm- R trapezius, improved after trigger point injection.  - ketorolac (TORADOL) injection 30 mg  3. Cough- acute - DG Chest 2 View; Future She was given Toradol 30 mg IM.  PROCEDURE-  Risks and benefits reviewed with pt.  TRIGGER POINT INJECTION: AREAS  Of pain were located and cleansed with alcohol and 1/2 ml lidocaine without epi applied to 6 different areas. Pt was in a lot of pain and tears during the procedure and was told I could stop after the firs one. But she wanted me to continue. After the procedure she dressed and I applied an ice pack on this area for 15 minutes. Afterwards I went to check on  her and she was feeling  much better. I removed the ice pack. Her pain was down to 6/10/ FU if she gets worse. Advised she may take Tylenol for pain as needed.  Mounir Skipper RODRIGUEZ-SOUTHWORTH, PA-C

## 2018-07-25 ENCOUNTER — Encounter: Payer: Self-pay | Admitting: Internal Medicine

## 2018-08-19 ENCOUNTER — Other Ambulatory Visit: Payer: Self-pay | Admitting: Internal Medicine

## 2018-08-20 ENCOUNTER — Encounter: Payer: Self-pay | Admitting: Internal Medicine

## 2018-08-20 ENCOUNTER — Ambulatory Visit (INDEPENDENT_AMBULATORY_CARE_PROVIDER_SITE_OTHER): Payer: Medicare Other | Admitting: Internal Medicine

## 2018-08-20 VITALS — BP 120/76 | HR 74 | Temp 98.0°F | Ht <= 58 in | Wt 194.8 lb

## 2018-08-20 DIAGNOSIS — J181 Lobar pneumonia, unspecified organism: Secondary | ICD-10-CM

## 2018-08-20 DIAGNOSIS — J189 Pneumonia, unspecified organism: Secondary | ICD-10-CM

## 2018-08-20 DIAGNOSIS — J020 Streptococcal pharyngitis: Secondary | ICD-10-CM

## 2018-08-20 DIAGNOSIS — J029 Acute pharyngitis, unspecified: Secondary | ICD-10-CM | POA: Diagnosis not present

## 2018-08-20 LAB — POCT RAPID STREP A (OFFICE): RAPID STREP A SCREEN: POSITIVE — AB

## 2018-08-20 MED ORDER — AZITHROMYCIN 500 MG PO TABS: 500.0000 mg | ORAL_TABLET | Freq: Every day | ORAL | 0 refills | Status: DC

## 2018-08-20 NOTE — Progress Notes (Addendum)
Subjective:     Patient ID: Julie Obrien , female    DOB: 08-30-1950 , 68 y.o.   MRN: [id number]   Chief Complaint  Patient presents with  . Pneumonia    Patient states she has completed her medication and needs another xray    HPI Pt is here for Fu pneumonia found on chest xray last month  Has had a cold since 12/22 but is getting better. Today her throat is very sore. Was able to eat a boiled egg this am. Denies fever, chills or sweats. She got her cold from her grand-daughter.  Past Medical History:  Diagnosis Date  . Arthritis    hnp, low back , knees - " all OA"  . Complication of anesthesia    nausea one surgery more recent  . History of kidney stones   . Pneumonia    Feb.- Mar. 2017, not hospitalized, but pending surgery was cancelled ,since then, pt. seen by PCP, told that it was cleared    . PONV (postoperative nausea and vomiting)      Family History  Problem Relation Age of Onset  . Dementia Mother   . Cancer Mother   . Cancer Sister   . Cancer Maternal Aunt      Current Outpatient Medications:  .  albuterol (VENTOLIN HFA) 108 (90 Base) MCG/ACT inhaler, Inhale 1-2 puffs into the lungs every 4 (four) hours as needed for wheezing or shortness of breath., Disp: 1 Inhaler, Rfl: 3 .  ibuprofen (ADVIL,MOTRIN) 800 MG tablet, Take 800 mg by mouth every 8 (eight) hours as needed (for pain). , Disp: , Rfl: 2 .  naproxen sodium (ALEVE) 220 MG tablet, Take 220-440 mg by mouth 2 (two) times daily as needed (for pain or headaches)., Disp: , Rfl:  .  Vitamin D, Ergocalciferol, (DRISDOL) 1.25 MG (50000 UT) CAPS capsule, TAKE ONE CAPSULE BY MOUTH 2 TIMES PER WEEK ON TUESDAYS AND FRIDAYS, Disp: 8 capsule, Rfl: 0   Allergies  Allergen Reactions  . Augmentin [Amoxicillin-Pot Clavulanate] Nausea And Vomiting    "Tore up my stomach" (verified this with her daughter, a physician)  . Sulfa Antibiotics Hives     Review of Systems  Constitutional: Negative for activity change,  appetite change, chills, diaphoresis, fatigue and fever.  HENT: Positive for congestion, postnasal drip, rhinorrhea, sore throat and trouble swallowing. Negative for ear discharge, ear pain, facial swelling, sinus pressure, sinus pain and voice change.   Eyes: Negative for discharge.  Respiratory: Positive for cough and wheezing. Negative for chest tightness and shortness of breath.   Cardiovascular: Negative for chest pain.  Musculoskeletal: Negative for back pain.  Skin: Negative for rash.  Allergic/Immunologic: Negative for environmental allergies.  Hematological: Negative for adenopathy.     Today's Vitals   08/20/18 1201  BP: 120/76  Pulse: 74  Temp: 98 F (36.7 C)  TempSrc: Oral  SpO2: 96%  Weight: 194 lb 12.8 oz (88.4 kg)  Height: 4' 6.2" (1.377 m)  PainSc: 0-No pain   Body mass index is 46.62 kg/m.   Objective:  Physical Exam Vitals signs and nursing note reviewed.  Constitutional:      General: She is not in acute distress.    Appearance: Normal appearance. She is not toxic-appearing.  HENT:     Head: Normocephalic.     Nose: Nose normal.     Mouth/Throat:     Mouth: Mucous membranes are moist.     Pharynx: Posterior oropharyngeal erythema present. No oropharyngeal  exudate.  Eyes:     General: No scleral icterus.    Conjunctiva/sclera: Conjunctivae normal.  Neck:     Musculoskeletal: Neck supple. Muscular tenderness present. No neck rigidity.     Comments: Has shotty nodes on upper cervical chain which is tender Cardiovascular:     Rate and Rhythm: Normal rate and regular rhythm.     Heart sounds: No murmur.  Pulmonary:     Effort: Pulmonary effort is normal. No respiratory distress.     Breath sounds: No wheezing or rales.  Musculoskeletal: Normal range of motion.  Lymphadenopathy:     Cervical: Cervical adenopathy present.  Skin:    General: Skin is warm and dry.  Neurological:     Mental Status: She is alert and oriented to person, place, and time.   Psychiatric:        Mood and Affect: Mood normal.        Thought Content: Thought content normal.        Judgment: Judgment normal.     Assessment And Plan:    1. Pharyngitis, unspecified etiology- acute - POCT rapid strep A- positive  2. Strep pharyngitis- acute. She was placed on Zithromax 500 mg qd x 5 days.   3. Pneumonia of both lower lobes due to infectious organism ([place]) FU - DG Chest 2 View; Future to be done next month to check for resolution.  Atavia Poppe RODRIGUEZ-SOUTHWORTH, PA-C

## 2018-08-20 NOTE — Patient Instructions (Signed)
Pharyngitis  Pharyngitis is a sore throat (pharynx). This is when there is redness, pain, and swelling in your throat. Most of the time, this condition gets better on its own. In some cases, you may need medicine. Follow these instructions at home:  Take over-the-counter and prescription medicines only as told by your doctor. ? If you were prescribed an antibiotic medicine, take it as told by your doctor. Do not stop taking the antibiotic even if you start to feel better. ? Do not give children aspirin. Aspirin has been linked to Reye syndrome.  Drink enough water and fluids to keep your pee (urine) clear or pale yellow.  Get a lot of rest.  Rinse your mouth (gargle) with a salt-water mixture 3-4 times a day or as needed. To make a salt-water mixture, completely dissolve -1 tsp of salt in 1 cup of warm water.  If your doctor approves, you may use throat lozenges or sprays to soothe your throat. Contact a doctor if:  You have large, tender lumps in your neck.  You have a rash.  You cough up green, yellow-brown, or bloody spit. Get help right away if:  You have a stiff neck.  You drool or cannot swallow liquids.  You cannot drink or take medicines without throwing up.  You have very bad pain that does not go away with medicine.  You have problems breathing, and it is not from a stuffy nose.  You have new pain and swelling in your knees, ankles, wrists, or elbows. Summary  Pharyngitis is a sore throat (pharynx). This is when there is redness, pain, and swelling in your throat.  If you were prescribed an antibiotic medicine, take it as told by your doctor. Do not stop taking the antibiotic even if you start to feel better.  Most of the time, pharyngitis gets better on its own. Sometimes, you may need medicine. This information is not intended to replace advice given to you by your health care provider. Make sure you discuss any questions you have with your health care  provider. Document Released: 01/17/2008 Document Revised: 09/05/2016 Document Reviewed: 09/05/2016 Elsevier Interactive Patient Education  2019 Elsevier Inc.  

## 2018-09-11 ENCOUNTER — Other Ambulatory Visit: Payer: Self-pay

## 2018-09-11 ENCOUNTER — Ambulatory Visit (INDEPENDENT_AMBULATORY_CARE_PROVIDER_SITE_OTHER): Payer: Medicare Other | Admitting: Internal Medicine

## 2018-09-11 ENCOUNTER — Ambulatory Visit
Admission: RE | Admit: 2018-09-11 | Discharge: 2018-09-11 | Disposition: A | Discharge status: Home / Self Care | Payer: Medicare Other | Source: Ambulatory Visit | Attending: Internal Medicine | Admitting: Internal Medicine

## 2018-09-11 ENCOUNTER — Other Ambulatory Visit: Payer: Self-pay | Admitting: Internal Medicine

## 2018-09-11 ENCOUNTER — Encounter: Payer: Self-pay | Admitting: Internal Medicine

## 2018-09-11 VITALS — BP 114/66 | HR 105 | Temp 102.4°F | Ht <= 58 in | Wt 188.4 lb

## 2018-09-11 DIAGNOSIS — R918 Other nonspecific abnormal finding of lung field: Secondary | ICD-10-CM | POA: Diagnosis not present

## 2018-09-11 DIAGNOSIS — J101 Influenza due to other identified influenza virus with other respiratory manifestations: Secondary | ICD-10-CM | POA: Diagnosis not present

## 2018-09-11 DIAGNOSIS — R059 Cough, unspecified: Secondary | ICD-10-CM

## 2018-09-11 DIAGNOSIS — J111 Influenza due to unidentified influenza virus with other respiratory manifestations: Secondary | ICD-10-CM

## 2018-09-11 DIAGNOSIS — Z20828 Contact with and (suspected) exposure to other viral communicable diseases: Secondary | ICD-10-CM

## 2018-09-11 DIAGNOSIS — J181 Lobar pneumonia, unspecified organism: Secondary | ICD-10-CM

## 2018-09-11 DIAGNOSIS — J189 Pneumonia, unspecified organism: Secondary | ICD-10-CM

## 2018-09-11 DIAGNOSIS — R05 Cough: Secondary | ICD-10-CM

## 2018-09-11 LAB — POC INFLUENZA A&B (BINAX/QUICKVUE)
INFLUENZA B, POC: NEGATIVE
Influenza A, POC: POSITIVE — AB

## 2018-09-11 MED ORDER — CEFTRIAXONE SODIUM 1 G IJ SOLR
1.0000 g | Freq: Once | INTRAMUSCULAR | Status: AC
  Administered 2018-09-11: 1 g via INTRAMUSCULAR

## 2018-09-11 MED ORDER — HYDROCODONE-HOMATROPINE 5-1.5 MG/5ML PO SYRP: 5.0000 mL | ORAL_SOLUTION | Freq: Four times a day (QID) | ORAL | 0 refills | Status: DC | PRN

## 2018-09-11 MED ORDER — AZITHROMYCIN 250 MG PO TABS: ORAL_TABLET | ORAL | 0 refills | Status: DC

## 2018-09-11 NOTE — Patient Instructions (Signed)

## 2018-09-12 ENCOUNTER — Encounter: Payer: Self-pay | Admitting: Internal Medicine

## 2018-09-12 ENCOUNTER — Inpatient Hospital Stay (HOSPITAL_COMMUNITY)
Admission: EM | Admit: 2018-09-12 | Discharge: 2018-09-13 | DRG: 193 | Disposition: A | Discharge status: Home / Self Care | Payer: Medicare Other | Attending: Internal Medicine | Admitting: Internal Medicine

## 2018-09-12 ENCOUNTER — Encounter (HOSPITAL_COMMUNITY): Payer: Self-pay

## 2018-09-12 ENCOUNTER — Other Ambulatory Visit: Payer: Self-pay

## 2018-09-12 ENCOUNTER — Telehealth: Payer: Self-pay

## 2018-09-12 ENCOUNTER — Emergency Department (HOSPITAL_COMMUNITY): Payer: Medicare Other

## 2018-09-12 DIAGNOSIS — M159 Polyosteoarthritis, unspecified: Secondary | ICD-10-CM | POA: Diagnosis present

## 2018-09-12 DIAGNOSIS — Z881 Allergy status to other antibiotic agents status: Secondary | ICD-10-CM | POA: Diagnosis not present

## 2018-09-12 DIAGNOSIS — J189 Pneumonia, unspecified organism: Secondary | ICD-10-CM | POA: Diagnosis present

## 2018-09-12 DIAGNOSIS — J440 Chronic obstructive pulmonary disease with acute lower respiratory infection: Secondary | ICD-10-CM | POA: Diagnosis present

## 2018-09-12 DIAGNOSIS — Z882 Allergy status to sulfonamides status: Secondary | ICD-10-CM

## 2018-09-12 DIAGNOSIS — F17200 Nicotine dependence, unspecified, uncomplicated: Secondary | ICD-10-CM | POA: Diagnosis present

## 2018-09-12 DIAGNOSIS — Z791 Long term (current) use of non-steroidal anti-inflammatories (NSAID): Secondary | ICD-10-CM

## 2018-09-12 DIAGNOSIS — J101 Influenza due to other identified influenza virus with other respiratory manifestations: Secondary | ICD-10-CM | POA: Diagnosis present

## 2018-09-12 DIAGNOSIS — Z981 Arthrodesis status: Secondary | ICD-10-CM

## 2018-09-12 DIAGNOSIS — R05 Cough: Secondary | ICD-10-CM | POA: Diagnosis not present

## 2018-09-12 DIAGNOSIS — Z87442 Personal history of urinary calculi: Secondary | ICD-10-CM | POA: Diagnosis not present

## 2018-09-12 DIAGNOSIS — Z79899 Other long term (current) drug therapy: Secondary | ICD-10-CM

## 2018-09-12 DIAGNOSIS — Z809 Family history of malignant neoplasm, unspecified: Secondary | ICD-10-CM | POA: Diagnosis not present

## 2018-09-12 DIAGNOSIS — J441 Chronic obstructive pulmonary disease with (acute) exacerbation: Secondary | ICD-10-CM | POA: Diagnosis present

## 2018-09-12 DIAGNOSIS — Z720 Tobacco use: Secondary | ICD-10-CM | POA: Diagnosis present

## 2018-09-12 DIAGNOSIS — F17210 Nicotine dependence, cigarettes, uncomplicated: Secondary | ICD-10-CM | POA: Diagnosis present

## 2018-09-12 DIAGNOSIS — J111 Influenza due to unidentified influenza virus with other respiratory manifestations: Secondary | ICD-10-CM | POA: Diagnosis not present

## 2018-09-12 DIAGNOSIS — R0902 Hypoxemia: Secondary | ICD-10-CM | POA: Diagnosis not present

## 2018-09-12 DIAGNOSIS — J1000 Influenza due to other identified influenza virus with unspecified type of pneumonia: Secondary | ICD-10-CM | POA: Diagnosis present

## 2018-09-12 DIAGNOSIS — J9601 Acute respiratory failure with hypoxia: Secondary | ICD-10-CM | POA: Diagnosis present

## 2018-09-12 LAB — CBC WITH DIFFERENTIAL/PLATELET
Abs Immature Granulocytes: 0.01 10*3/uL (ref 0.00–0.07)
BASOS ABS: 0 10*3/uL (ref 0.0–0.1)
Basophils Relative: 0 %
Eosinophils Absolute: 0 10*3/uL (ref 0.0–0.5)
Eosinophils Relative: 0 %
HCT: 37.9 % (ref 36.0–46.0)
Hemoglobin: 12.2 g/dL (ref 12.0–15.0)
Immature Granulocytes: 0 %
LYMPHS PCT: 18 %
Lymphs Abs: 1.2 10*3/uL (ref 0.7–4.0)
MCH: 27.4 pg (ref 26.0–34.0)
MCHC: 32.2 g/dL (ref 30.0–36.0)
MCV: 85.2 fL (ref 80.0–100.0)
Monocytes Absolute: 0.2 10*3/uL (ref 0.1–1.0)
Monocytes Relative: 3 %
Neutro Abs: 5.3 10*3/uL (ref 1.7–7.7)
Neutrophils Relative %: 79 %
Platelets: 264 10*3/uL (ref 150–400)
RBC: 4.45 MIL/uL (ref 3.87–5.11)
RDW: 15.5 % (ref 11.5–15.5)
WBC: 6.8 10*3/uL (ref 4.0–10.5)
nRBC: 0 % (ref 0.0–0.2)

## 2018-09-12 LAB — LACTIC ACID, PLASMA: Lactic Acid, Venous: 1.1 mmol/L (ref 0.5–1.9)

## 2018-09-12 LAB — BASIC METABOLIC PANEL
ANION GAP: 11 (ref 5–15)
BUN: 10 mg/dL (ref 8–23)
CO2: 23 mmol/L (ref 22–32)
Calcium: 9 mg/dL (ref 8.9–10.3)
Chloride: 103 mmol/L (ref 98–111)
Creatinine, Ser: 0.82 mg/dL (ref 0.44–1.00)
GFR calc non Af Amer: >60 mL/min (ref >60)
Glucose, Bld: 133 mg/dL — ABNORMAL HIGH (ref 70–99)
Potassium: 3.9 mmol/L (ref 3.5–5.1)
Sodium: 137 mmol/L (ref 135–145)

## 2018-09-12 MED ORDER — IBUPROFEN 800 MG PO TABS
800.0000 mg | ORAL_TABLET | Freq: Three times a day (TID) | ORAL | Status: DC | PRN
  Administered 2018-09-12: 800 mg via ORAL
  Filled 2018-09-12: qty 1

## 2018-09-12 MED ORDER — HYDROCODONE-HOMATROPINE 5-1.5 MG/5ML PO SYRP: 5.0000 mL | ORAL_SOLUTION | Freq: Four times a day (QID) | ORAL | Status: DC | PRN

## 2018-09-12 MED ORDER — ACETAMINOPHEN 325 MG PO TABS
650.0000 mg | ORAL_TABLET | Freq: Four times a day (QID) | ORAL | Status: DC | PRN
  Administered 2018-09-13: 650 mg via ORAL
  Filled 2018-09-12: qty 2

## 2018-09-12 MED ORDER — ONDANSETRON HCL 4 MG/2ML IJ SOLN: 4.0000 mg | Freq: Four times a day (QID) | INTRAMUSCULAR | Status: DC | PRN

## 2018-09-12 MED ORDER — SODIUM CHLORIDE 0.9 % IV SOLN
1.0000 g | Freq: Once | INTRAVENOUS | Status: AC
  Administered 2018-09-12: 1 g via INTRAVENOUS
  Filled 2018-09-12: qty 10

## 2018-09-12 MED ORDER — PREDNISONE 20 MG PO TABS
40.0000 mg | ORAL_TABLET | Freq: Every day | ORAL | Status: DC
  Administered 2018-09-13: 40 mg via ORAL
  Filled 2018-09-12: qty 2

## 2018-09-12 MED ORDER — OSELTAMIVIR PHOSPHATE 30 MG PO CAPS
30.0000 mg | ORAL_CAPSULE | Freq: Two times a day (BID) | ORAL | Status: DC
  Administered 2018-09-12 – 2018-09-13 (×2): 30 mg via ORAL
  Filled 2018-09-12 (×3): qty 1

## 2018-09-12 MED ORDER — ACETAMINOPHEN 325 MG PO TABS
650.0000 mg | ORAL_TABLET | Freq: Once | ORAL | Status: DC
  Filled 2018-09-12: qty 2

## 2018-09-12 MED ORDER — PANTOPRAZOLE SODIUM 40 MG PO TBEC
40.0000 mg | DELAYED_RELEASE_TABLET | Freq: Every day | ORAL | Status: DC
  Administered 2018-09-12 – 2018-09-13 (×2): 40 mg via ORAL
  Filled 2018-09-12 (×2): qty 1

## 2018-09-12 MED ORDER — ALBUTEROL SULFATE HFA 108 (90 BASE) MCG/ACT IN AERS: 1.0000 | INHALATION_SPRAY | RESPIRATORY_TRACT | Status: DC | PRN

## 2018-09-12 MED ORDER — ONDANSETRON HCL 4 MG PO TABS
4.0000 mg | ORAL_TABLET | Freq: Four times a day (QID) | ORAL | Status: DC | PRN
  Administered 2018-09-13: 4 mg via ORAL
  Filled 2018-09-12: qty 1

## 2018-09-12 MED ORDER — ACETAMINOPHEN 650 MG RE SUPP: 650.0000 mg | Freq: Four times a day (QID) | RECTAL | Status: DC | PRN

## 2018-09-12 MED ORDER — POLYETHYLENE GLYCOL 3350 17 G PO PACK: 17.0000 g | PACK | Freq: Every day | ORAL | Status: DC | PRN

## 2018-09-12 MED ORDER — IPRATROPIUM-ALBUTEROL 0.5-2.5 (3) MG/3ML IN SOLN
3.0000 mL | Freq: Once | RESPIRATORY_TRACT | Status: AC
  Administered 2018-09-12: 3 mL via RESPIRATORY_TRACT
  Filled 2018-09-12: qty 3

## 2018-09-12 MED ORDER — HEPARIN SODIUM (PORCINE) 5000 UNIT/ML IJ SOLN
5000.0000 [IU] | Freq: Three times a day (TID) | INTRAMUSCULAR | Status: DC
  Administered 2018-09-12 – 2018-09-13 (×2): 5000 [IU] via SUBCUTANEOUS
  Filled 2018-09-12 (×3): qty 1

## 2018-09-12 MED ORDER — SODIUM CHLORIDE 0.9 % IV SOLN: 500.0000 mg | Freq: Once | INTRAVENOUS | Status: DC

## 2018-09-12 MED ORDER — ALBUTEROL SULFATE (2.5 MG/3ML) 0.083% IN NEBU: 2.5000 mg | INHALATION_SOLUTION | RESPIRATORY_TRACT | Status: DC | PRN

## 2018-09-12 MED ORDER — METHYLPREDNISOLONE SODIUM SUCC 40 MG IJ SOLR
40.0000 mg | Freq: Two times a day (BID) | INTRAMUSCULAR | Status: AC
  Administered 2018-09-12 – 2018-09-13 (×2): 40 mg via INTRAVENOUS
  Filled 2018-09-12 (×2): qty 1

## 2018-09-12 MED ORDER — DEXTROSE-NACL 5-0.45 % IV SOLN
INTRAVENOUS | Status: DC
  Administered 2018-09-12: 18:00:00 via INTRAVENOUS

## 2018-09-12 NOTE — Telephone Encounter (Signed)
The patient was told that Dr. Baird Cancer said for her to go to the ER because the pt's oxygen level may have decreased because the pt said she feels the same.

## 2018-09-12 NOTE — ED Triage Notes (Addendum)
Pt endorses being sent by Dr Baird Cancer to have her spo2 checked here after being diagnoed with flu A and possible early pneumonia yesterday and was given antibiotic shot, placed on erythromycin, tamiflu, prednisone, and zofran. Spo2 93% in triage. Speaking in complete sentences. Denies shob and states "I feel better today"

## 2018-09-12 NOTE — ED Provider Notes (Signed)
[facility] EMERGENCY DEPARTMENT Provider Note   CSN: [id number] Arrival date & time: 09/12/18  1336     History   Chief Complaint Chief Complaint  Patient presents with  . Influenza    HPI Julie Obrien is a 68 y.o. female.  The history is provided by the patient.  Shortness of Breath  Severity:  Moderate Onset quality:  Gradual Timing:  Constant Progression:  Unchanged Chronicity:  New Context: URI (dx with flu A and pneumonia on tamiflu and zpak. Also COPD hx and on steroids. )   Relieved by:  Inhaler Worsened by:  Activity and coughing Associated symptoms: cough, fever and wheezing   Associated symptoms: no abdominal pain, no chest pain, no claudication, no ear pain, no rash, no sore throat, no sputum production and no vomiting     Past Medical History:  Diagnosis Date  . Arthritis    hnp, low back , knees - " all OA"  . Complication of anesthesia    nausea one surgery more recent  . History of kidney stones   . Pneumonia    Feb.- Mar. 2017, not hospitalized, but pending surgery was cancelled ,since then, pt. seen by PCP, told that it was cleared    . PONV (postoperative nausea and vomiting)     Patient Active Problem List   Diagnosis Date Noted  . Influenza A 09/12/2018  . COPD exacerbation ([place]) 02/11/2018  . Asthma with acute exacerbation 02/10/2018  . Spinal stenosis of lumbar region 01/05/2016  . Thoracic spine fracture, closed, initial encounter 06/21/2015  . 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 BACK SURGERY  08  . CERVICAL DISCECTOMY  09  . CESAREAN SECTION     x4  . CYST EXCISION     vocal cords?  . CYSTO  99   stone  . HARDWARE REMOVAL N/A 06/21/2015   Procedure: Exploration of Fusion and removal of hardware right thoracic level one;  Surgeon: Kary Kos, MD;  Location: [facility];  Service: Neurosurgery;  Laterality: N/A;  . POSTERIOR CERVICAL  FUSION/FORAMINOTOMY N/A 05/31/2015   Procedure: Decompression and Laminectomy Cervical three-four  Cervical six-seven  Cervical seven-thoracic-one Posterior cervical fusion with lateral mass fixation;  Surgeon: Kary Kos, MD;  Location: [facility];  Service: Neurosurgery;  Laterality: N/A;  . TONSILLECTOMY    . TUBAL LIGATION       OB History   No obstetric history on file.      Home Medications    Prior to Admission medications   Medication Sig Start Date End Date Taking? Authorizing Provider  albuterol (VENTOLIN HFA) 108 (90 Base) MCG/ACT inhaler Inhale 1-2 puffs into the lungs every 4 (four) hours as needed for wheezing or shortness of breath. 02/13/18   Danford, Suann Larry, MD  azithromycin (ZITHROMAX Z-PAK) 250 MG tablet Take 2 tablets (500 mg) on  Day 1,  followed by 1 tablet (250 mg) once daily on Days 2 through 5. 09/11/18 09/16/18  Glendale Chard, MD  HYDROcodone-homatropine (HYDROMET) 5-1.5 MG/5ML syrup Take 5 mLs by mouth every 6 (six) hours as needed. 09/11/18   Glendale Chard, MD  ibuprofen (ADVIL,MOTRIN) 800 MG tablet Take 800 mg by mouth every 8 (eight) hours as needed (for pain).  01/07/18   [provider]  naproxen sodium (ALEVE) 220 MG tablet Take 220-440 mg by mouth 2 (two) times daily as needed (for pain or headaches).    [provider]  ondansetron (ZOFRAN-ODT) 4 MG disintegrating tablet Take 4 mg by mouth every 8 (eight) hours as needed for nausea or vomiting.    [provider]  oseltamivir (TAMIFLU) 75 MG capsule Take 75 mg by mouth.    [provider]  Vitamin D, Ergocalciferol, (DRISDOL) 1.25 MG (50000 UT) CAPS capsule TAKE ONE CAPSULE BY MOUTH 2 TIMES PER WEEK ON TUESDAYS AND FRIDAYS 08/19/18   Glendale Chard, MD    Family History Family History  Problem Relation Age of Onset  . Dementia Mother   . Cancer Mother   . Cancer Sister   . Cancer Maternal Aunt     Social History Social History   Tobacco Use  . Smoking  status: Current Every Day Smoker    Packs/day: 0.50    Years: 10.00    Pack years: 5.00    Types: Cigarettes  . Smokeless tobacco: Current User  Substance Use Topics  . Alcohol use: No  . Drug use: No     Allergies   Augmentin [amoxicillin-pot clavulanate] and Sulfa antibiotics   Review of Systems Review of Systems  Constitutional: Positive for fever. Negative for chills.  HENT: Negative for ear pain and sore throat.   Eyes: Negative for pain and visual disturbance.  Respiratory: Positive for cough, shortness of breath and wheezing. Negative for sputum production.   Cardiovascular: Negative for chest pain, palpitations and claudication.  Gastrointestinal: Negative for abdominal pain and vomiting.  Genitourinary: Negative for dysuria and hematuria.  Musculoskeletal: Negative for arthralgias and back pain.  Skin: Negative for color change and rash.  Neurological: Negative for seizures and syncope.  All other systems reviewed and are negative.    Physical Exam Updated Vital Signs  ED Triage Vitals  Enc Vitals Group     BP 09/12/18 1341 116/62     Pulse Rate 09/12/18 1341 87     Resp 09/12/18 1341 20     Temp 09/12/18 1341 99.4 F (37.4 C)     Temp Source 09/12/18 1341 Oral     SpO2 09/12/18 1341 92 %     Weight --      Height --      Head Circumference --      Peak Flow --      Pain Score 09/12/18 1346 0     Pain Loc --      Pain Edu? --      Excl. in [place]? --     Physical Exam Vitals signs and nursing note reviewed.  Constitutional:      General: She is not in acute distress.    Appearance: She is well-developed. She is not ill-appearing.  HENT:     Head: Normocephalic and atraumatic.     Nose: Nose normal.     Mouth/Throat:     Mouth: Mucous membranes are moist.  Eyes:     Extraocular Movements: Extraocular movements intact.     Conjunctiva/sclera: Conjunctivae normal.     Pupils: Pupils are equal, round, and reactive to light.  Neck:      Musculoskeletal: Normal range of motion and neck supple.  Cardiovascular:     Rate and Rhythm: Normal rate and regular rhythm.     Pulses: Normal pulses.     Heart sounds: Normal heart sounds. No murmur.  Pulmonary:     Effort: Pulmonary effort is normal. No respiratory distress.     Breath sounds: Wheezing and rhonchi present.  Abdominal:     Palpations: Abdomen  is soft.     Tenderness: There is no abdominal tenderness.  Skin:    General: Skin is warm and dry.     Capillary Refill: Capillary refill takes less than 2 seconds.     Findings: No rash.  Neurological:     General: No focal deficit present.     Mental Status: She is alert.  Psychiatric:        Mood and Affect: Mood normal.      ED Treatments / Results  Labs (all labs ordered are listed, but only abnormal results are displayed) Labs Reviewed  BASIC METABOLIC PANEL - Abnormal; Notable for the following components:      Result Value   Glucose, Bld 133 (*)    All other components within normal limits  CBC WITH DIFFERENTIAL/PLATELET  LACTIC ACID, PLASMA    EKG None  Radiology Dg Chest 2 View  Result Date: 09/12/2018 CLINICAL DATA:  Cough. EXAM: CHEST - 2 VIEW COMPARISON:  Multiple chest x-rays since February 11, 2018 FINDINGS: The opacity in the right mid lung is somewhat platelike and more prominent the interval. Suggested subtle opacity in the right base, not seen yesterday. No other interval changes. IMPRESSION: 1. The platelike opacity in the right upper lobe may represent atelectasis. Infiltrate not excluded. 2. Subtle increased opacity in the right base is worrisome for early pneumonia given history. 3. No other acute abnormalities. Electronically Signed   By: Dorise Bullion III M.D   On: 09/12/2018 16:49   Dg Chest 2 View  Result Date: 09/11/2018 CLINICAL DATA:  Cough, fever, positive flu test today, eval for pneumonia EXAM: CHEST - 2 VIEW COMPARISON:  07/24/2018 FINDINGS: poorly marginated patchy airspace  opacities laterally and posteriorly in the right upper lung. Chronic coarse perihilar bronchovascular markings. No overt edema. Heart size and mediastinal contours are within normal limits. No effusion. Anterior vertebral endplate spurring at multiple levels in the mid and lower thoracic spine. Cervical and lumbar fixation hardware partially visualized. IMPRESSION: Patchy right upper lobe airspace opacities suggesting early pneumonia. Electronically Signed   By: Lucrezia Europe M.D.   On: 09/11/2018 16:27    Procedures .Critical Care Performed by: Lennice Sites, DO Authorized by: Lennice Sites, DO   Critical care provider statement:    Critical care time (minutes):  40   Critical care time was exclusive of:  Separately billable procedures and treating other patients and teaching time   Critical care was necessary to treat or prevent imminent or life-threatening deterioration of the following conditions:  Respiratory failure   Critical care was time spent personally by me on the following activities:  Blood draw for specimens, discussions with primary provider, development of treatment plan with patient or surrogate, evaluation of patient's response to treatment, examination of patient, obtaining history from patient or surrogate, ordering and performing treatments and interventions, ordering and review of laboratory studies, ordering and review of radiographic studies, pulse oximetry, re-evaluation of patient's condition and review of old charts   I assumed direction of critical care for this patient from another provider in my specialty: no     (including critical care time)  Medications Ordered in ED Medications  acetaminophen (TYLENOL) tablet 650 mg (650 mg Oral Refused 09/12/18 1455)  azithromycin (ZITHROMAX) 500 mg in sodium chloride 0.9 % 250 mL IVPB (has no administration in time range)  cefTRIAXone (ROCEPHIN) 1 g in sodium chloride 0.9 % 100 mL IVPB (1 g Intravenous New Bag/Given 09/12/18  1651)  ipratropium-albuterol (DUONEB) 0.5-2.5 (3)  MG/3ML nebulizer solution 3 mL (3 mLs Nebulization Given 09/12/18 1455)     Initial Impression / Assessment and Plan / ED Course  I have reviewed the triage vital signs and the nursing notes.  Pertinent labs & imaging results that were available during my care of the patient were reviewed by me and considered in my medical decision making (see chart for details).     KADEJA GRANADA is a 68 year old female with history of COPD who presents to the ED with cough, concern for worsening flu.  Patient with hypoxia upon arrival to triage.  That appeared to improve at rest.  Patient otherwise with low-grade fever.  Unremarkable vitals otherwise.  Normal heart rate.  Patient diagnosed with influenza A yesterday and started on Tamiflu, breathing treatments, steroids.  Patient had chest x-ray done after she left that showed possible pneumonia.  Patient was given Rocephin shot and given prescription for Zithromax.  Patient was called to be evaluated in the ED due to pneumonia.  Patient overall appears well however upon discussion with her she appears to drop her oxygen saturation to the mid 80s.  Will be placed on 2 L of oxygen.  Given influenza, pneumonia we will get blood cultures, start patient on IV Rocephin, IV Zithromax.  Patient given breathing treatment.  Patient given Tylenol.  Patient with hypoxic respiratory failure likely secondary to flu and pneumonia.  Lab work did not show any severe leukocytosis, lactic acidosis.  Overall unremarkable electrolytes.  No kidney injury.  Patient admitted to hospital service for further care.  This chart was dictated using voice recognition software.  Despite best efforts to proofread,  errors can occur which can change the documentation meaning.    Final Clinical Impressions(s) / ED Diagnoses   Final diagnoses:  Influenza  Community acquired pneumonia, unspecified laterality  Acute respiratory failure with  hypoxia [facility])  COPD exacerbation [facility])    ED Discharge Orders    None       Lennice Sites, DO 09/12/18 1719

## 2018-09-12 NOTE — Progress Notes (Addendum)
Subjective:     Patient ID: Julie Obrien , female    DOB: 1951/05/21 , 68 y.o.   MRN: [id number]   Chief Complaint  Patient presents with  . flu symptoms    HPI  She is here today for further evaluation of cough/cold symptoms.  She reports that she got sick from her granddaughter who was recently diagnosed with the flu. She visited w/ her granddaughter this weekend in [place]. She developed a cough on Monday. Her symptoms progressed and her daughter (a physician) called in some Tamiflu for her. She started this last night. She came in today because she has not had any improvement in her symptoms. She denies sore throat. Cough is non-productive. She has had a fever, up to 102.     Past Medical History:  Diagnosis Date  . Arthritis    hnp, low back , knees - " all OA"  . Complication of anesthesia    nausea one surgery more recent  . History of kidney stones   . Pneumonia    Feb.- Mar. 2017, not hospitalized, but pending surgery was cancelled ,since then, pt. seen by PCP, told that it was cleared    . PONV (postoperative nausea and vomiting)      Family History  Problem Relation Age of Onset  . Dementia Mother   . Cancer Mother   . Cancer Sister   . Cancer Maternal Aunt     No current facility-administered medications for this visit.  No current outpatient medications on file.  Facility-Administered Medications Ordered in Other Visits:  .  acetaminophen (TYLENOL) tablet 650 mg, 650 mg, Oral, Q6H PRN **OR** acetaminophen (TYLENOL) suppository 650 mg, 650 mg, Rectal, Q6H PRN, Charlynne Cousins, MD .  albuterol (PROVENTIL HFA;VENTOLIN HFA) 108 (90 Base) MCG/ACT inhaler 1-2 puff, 1-2 puff, Inhalation, Q4H PRN, Charlynne Cousins, MD .  dextrose 5 %-0.45 % sodium chloride infusion, , Intravenous, Continuous, Charlynne Cousins, MD .  heparin injection 5,000 Units, 5,000 Units, Subcutaneous, Q8H, Charlynne Cousins, MD .  HYDROcodone-homatropine [facility]) 5-1.5 MG/5ML  syrup 5 mL, 5 mL, Oral, Q6H PRN, Charlynne Cousins, MD .  ibuprofen (ADVIL,MOTRIN) tablet 800 mg, 800 mg, Oral, Q8H PRN, Charlynne Cousins, MD .  methylPREDNISolone sodium succinate (SOLU-MEDROL) 40 mg/mL injection 40 mg, 40 mg, Intravenous, Q12H, Charlynne Cousins, MD .  ondansetron [facility]) tablet 4 mg, 4 mg, Oral, Q6H PRN **OR** ondansetron (ZOFRAN) injection 4 mg, 4 mg, Intravenous, Q6H PRN, Charlynne Cousins, MD .  oseltamivir (TAMIFLU) capsule 75 mg, 75 mg, Oral, BID, Charlynne Cousins, MD .  pantoprazole (PROTONIX) EC tablet 40 mg, 40 mg, Oral, Daily, Aileen Fass, Tammi Klippel, MD .  polyethylene glycol [facility] Floria Raveling) packet 17 g, 17 g, Oral, Daily PRN, Charlynne Cousins, MD .  Derrill Memo ON 09/13/2018] predniSONE (DELTASONE) tablet 40 mg, 40 mg, Oral, Q breakfast, Charlynne Cousins, MD   Allergies  Allergen Reactions  . Augmentin [Amoxicillin-Pot Clavulanate] Nausea And Vomiting    "Tore up my stomach" (verified this with her daughter, a physician)  . Sulfa Antibiotics Hives     Review of Systems  Constitutional: Positive for chills, fatigue and fever.  Respiratory: Positive for cough.   Cardiovascular: Negative.   Gastrointestinal: Negative.   Neurological: Negative.   Psychiatric/Behavioral: Negative.      Today's Vitals   09/11/18 1024 09/11/18 1300  BP: 114/66   Pulse: (!) 105   Temp: (!) 102.4 F (39.1 C)  TempSrc: Oral   SpO2: (!) 86% 90%  Weight: 188 lb 6.4 oz (85.5 kg)   Height: 4' 6.2" (1.377 m)   PainSc: 10-Worst pain ever   PainLoc: Generalized    Body mass index is 45.09 kg/m.   Objective:  Physical Exam Vitals signs and nursing note reviewed.  Constitutional:      Appearance: She is obese. She is ill-appearing.  HENT:     Head: Normocephalic and atraumatic.     Right Ear: Tympanic membrane, ear canal and external ear normal.     Left Ear: Tympanic membrane, ear canal and external ear normal.     Mouth/Throat:     Pharynx:  Posterior oropharyngeal erythema present.  Cardiovascular:     Rate and Rhythm: Regular rhythm. Tachycardia present.     Heart sounds: Normal heart sounds.  Pulmonary:     Breath sounds: Rhonchi present.     Comments: Decreased breath sounds at the right base Neurological:     General: No focal deficit present.         Assessment And Plan:     1. Influenza A  She is positive for influenza A. She is encouraged to continue with tamiflu as directed.  She is advised to alternate tylenol and ibuprofen every 4-6 hours to keep her fever down. She is encouraged to rest and stay well hydrated.   - POC Influenza A&B(BINAX/QUICKVUE)  2. Pneumonia of right lower lobe due to infectious organism [facility])  Exam is worrisome for recurrence of PNA. Of note, she was recently treated for the same in the past four weeks. Has f/u CXR scheduled sometime next month.  Pt initially with oxygenation at 86%. She refuses to go to the hospital. Repeat pulse ox 90%. She was given rocephin, 1 gm and rx zpak. She was also advised to get CXR today. She is encouraged to go to ER should her sx persist. I will call and check on the patient in an hour or so. Unfortunately, after much discussion, she is firm about not going to the ER.   - cefTRIAXone (ROCEPHIN) injection 1 g        Maximino Greenland, MD

## 2018-09-12 NOTE — H&P (Signed)
History and Physical  Julie Obrien ZOX:[id number] DOB: 1951/03/29 DOA: 09/12/2018  PCP: Glendale Chard, MD Patient coming from: Home  I have personally briefly reviewed patient's old medical records in [place]   Chief Complaint: SOB  HPI: Julie Obrien is a 68 y.o. female past medical history significant for tobacco abuse possible COPD recently seen at her PCPs office for shortness of breath chest x-ray was done and influenza PCR, which they both came back today which were positive, she came in here to the ED and she was de-satting with ambulation, the patient relates she has been short of breath but not coughing, she has been having fevers at home.  And Tylenol but has been feeling more weak with decreased appetite and decreased oral intake.   Review of Systems: All systems reviewed and apart from history of presenting illness, are negative.  Past Medical History:  Diagnosis Date  . Arthritis    hnp, low back , knees - " all OA"  . Complication of anesthesia    nausea one surgery more recent  . History of kidney stones   . Pneumonia    Feb.- Mar. 2017, not hospitalized, but pending surgery was cancelled ,since then, pt. seen by PCP, told that it was cleared    . PONV (postoperative nausea and vomiting)    Past Surgical History:  Procedure Laterality Date  . BACK SURGERY  08  . CERVICAL DISCECTOMY  09  . CESAREAN SECTION     x4  . CYST EXCISION     vocal cords?  . CYSTO  99   stone  . HARDWARE REMOVAL N/A 06/21/2015   Procedure: Exploration of Fusion and removal of hardware right thoracic level one;  Surgeon: Kary Kos, MD;  Location: [facility];  Service: Neurosurgery;  Laterality: N/A;  . POSTERIOR CERVICAL FUSION/FORAMINOTOMY N/A 05/31/2015   Procedure: Decompression and Laminectomy Cervical three-four  Cervical six-seven  Cervical seven-thoracic-one Posterior cervical fusion with lateral mass fixation;  Surgeon: Kary Kos, MD;  Location: [facility];   Service: Neurosurgery;  Laterality: N/A;  . TONSILLECTOMY    . TUBAL LIGATION     Social History:  reports that she has been smoking cigarettes. She has a 5.00 pack-year smoking history. She uses smokeless tobacco. She reports that she does not drink alcohol or use drugs.   Allergies  Allergen Reactions  . Augmentin [Amoxicillin-Pot Clavulanate] Nausea And Vomiting    "Tore up my stomach" (verified this with her daughter, a physician)  . Sulfa Antibiotics Hives    Family History  Problem Relation Age of Onset  . Dementia Mother   . Cancer Mother   . Cancer Sister   . Cancer Maternal Aunt      Prior to Admission medications   Medication Sig Start Date End Date Taking? Authorizing Provider  albuterol (VENTOLIN HFA) 108 (90 Base) MCG/ACT inhaler Inhale 1-2 puffs into the lungs every 4 (four) hours as needed for wheezing or shortness of breath. 02/13/18   Danford, Suann Larry, MD  azithromycin (ZITHROMAX Z-PAK) 250 MG tablet Take 2 tablets (500 mg) on  Day 1,  followed by 1 tablet (250 mg) once daily on Days 2 through 5. 09/11/18 09/16/18  Glendale Chard, MD  HYDROcodone-homatropine (HYDROMET) 5-1.5 MG/5ML syrup Take 5 mLs by mouth every 6 (six) hours as needed. 09/11/18   Glendale Chard, MD  ibuprofen (ADVIL,MOTRIN) 800 MG tablet Take 800 mg by mouth every 8 (eight) hours as needed (for  pain).  01/07/18   [provider]  naproxen sodium (ALEVE) 220 MG tablet Take 220-440 mg by mouth 2 (two) times daily as needed (for pain or headaches).    [provider]  ondansetron (ZOFRAN-ODT) 4 MG disintegrating tablet Take 4 mg by mouth every 8 (eight) hours as needed for nausea or vomiting.    [provider]  oseltamivir (TAMIFLU) 75 MG capsule Take 75 mg by mouth.    [provider]  Vitamin D, Ergocalciferol, (DRISDOL) 1.25 MG (50000 UT) CAPS capsule TAKE ONE CAPSULE BY MOUTH 2 TIMES PER WEEK ON TUESDAYS AND FRIDAYS 08/19/18   Glendale Chard, MD   Physical  Exam: Vitals:   09/12/18 1341 09/12/18 1600  BP: 116/62 117/60  Pulse: 87 72  Resp: 20 18  Temp: 99.4 F (37.4 C)   TempSrc: Oral   SpO2: 92% 93%     General exam: Moderately built and nourished patient, lying comfortably supine on the gurney in no obvious distress.  Head, eyes and ENT: Nontraumatic and normocephalic. Pupils equally reacting to light and accommodation. Oral mucosa moist.  Neck: Supple. No JVD, carotid bruit or thyromegaly.  Lymphatics: No lymphadenopathy.  Respiratory system: Good air movement with wheezing bilaterally.  Cardiovascular system: S1 and S2 heard, RRR. No JVD, murmurs, gallops, clicks or pedal edema.  Gastrointestinal system: Abdomen is nondistended, soft and nontender. Normal bowel sounds heard. No organomegaly or masses appreciated.  Central nervous system: Alert and oriented. No focal neurological deficits.  Extremities: Symmetric 5 x 5 power. Peripheral pulses symmetrically felt.   Skin: No rashes or acute findings.  Musculoskeletal system: Negative exam.  Psychiatry: Pleasant and cooperative.   Labs on Admission:  Basic Metabolic Panel: Recent Labs  Lab 09/12/18 1406  NA 137  K 3.9  CL 103  CO2 23  GLUCOSE 133*  BUN 10  CREATININE 0.82  CALCIUM 9.0   Liver Function Tests: No results for input(s): AST, ALT, ALKPHOS, BILITOT, PROT, ALBUMIN in the last 168 hours. No results for input(s): LIPASE, AMYLASE in the last 168 hours. No results for input(s): AMMONIA in the last 168 hours. CBC: Recent Labs  Lab 09/12/18 1406  WBC 6.8  NEUTROABS 5.3  HGB 12.2  HCT 37.9  MCV 85.2  PLT 264   Cardiac Enzymes: No results for input(s): CKTOTAL, CKMB, CKMBINDEX, TROPONINI in the last 168 hours.  BNP (last 3 results) No results for input(s): PROBNP in the last 8760 hours. CBG: No results for input(s): GLUCAP in the last 168 hours.  Radiological Exams on Admission: Dg Chest 2 View  Result Date: 09/12/2018 CLINICAL DATA:   Cough. EXAM: CHEST - 2 VIEW COMPARISON:  Multiple chest x-rays since February 11, 2018 FINDINGS: The opacity in the right mid lung is somewhat platelike and more prominent the interval. Suggested subtle opacity in the right base, not seen yesterday. No other interval changes. IMPRESSION: 1. The platelike opacity in the right upper lobe may represent atelectasis. Infiltrate not excluded. 2. Subtle increased opacity in the right base is worrisome for early pneumonia given history. 3. No other acute abnormalities. Electronically Signed   By: Dorise Bullion III M.D   On: 09/12/2018 16:49   Dg Chest 2 View  Result Date: 09/11/2018 CLINICAL DATA:  Cough, fever, positive flu test today, eval for pneumonia EXAM: CHEST - 2 VIEW COMPARISON:  07/24/2018 FINDINGS: poorly marginated patchy airspace opacities laterally and posteriorly in the right upper lung. Chronic coarse perihilar bronchovascular markings. No overt edema. Heart size  and mediastinal contours are within normal limits. No effusion. Anterior vertebral endplate spurring at multiple levels in the mid and lower thoracic spine. Cervical and lumbar fixation hardware partially visualized. IMPRESSION: Patchy right upper lobe airspace opacities suggesting early pneumonia. Electronically Signed   By: Lucrezia Europe M.D.   On: 09/11/2018 16:27    EKG: Independently reviewed. none  Assessment/Plan Active Problems:   COPD exacerbation (HCC)   Influenza A  She relates anorexia with poor oral intake, will start her on IV fluids, continue steroids IV she desatted in the ED with ambulation, will continue Tamiflu, she does not have a fever or leukocytosis despite being on steroids so we will not continue empiric antibiotics at this time. We will monitor strict I's and O's, will have physical therapy evaluate her.     DVT Prophylaxis: Heparin Code Status: full  Family Communication: none  Disposition Plan: Patient is admitted under observation  Time spent: 56  min  Charlynne Cousins MD  09/12/2018, 4:52 PM

## 2018-09-13 DIAGNOSIS — Z79899 Other long term (current) drug therapy: Secondary | ICD-10-CM | POA: Diagnosis not present

## 2018-09-13 DIAGNOSIS — J101 Influenza due to other identified influenza virus with other respiratory manifestations: Secondary | ICD-10-CM | POA: Diagnosis not present

## 2018-09-13 DIAGNOSIS — Z791 Long term (current) use of non-steroidal anti-inflammatories (NSAID): Secondary | ICD-10-CM | POA: Diagnosis not present

## 2018-09-13 DIAGNOSIS — R0902 Hypoxemia: Secondary | ICD-10-CM

## 2018-09-13 DIAGNOSIS — J1000 Influenza due to other identified influenza virus with unspecified type of pneumonia: Secondary | ICD-10-CM | POA: Diagnosis not present

## 2018-09-13 DIAGNOSIS — Z720 Tobacco use: Secondary | ICD-10-CM | POA: Diagnosis not present

## 2018-09-13 DIAGNOSIS — J189 Pneumonia, unspecified organism: Secondary | ICD-10-CM | POA: Diagnosis not present

## 2018-09-13 DIAGNOSIS — J441 Chronic obstructive pulmonary disease with (acute) exacerbation: Secondary | ICD-10-CM

## 2018-09-13 DIAGNOSIS — J9601 Acute respiratory failure with hypoxia: Secondary | ICD-10-CM | POA: Diagnosis not present

## 2018-09-13 DIAGNOSIS — J440 Chronic obstructive pulmonary disease with acute lower respiratory infection: Secondary | ICD-10-CM | POA: Diagnosis not present

## 2018-09-13 MED ORDER — OSELTAMIVIR PHOSPHATE 30 MG PO CAPS: 30.0000 mg | ORAL_CAPSULE | Freq: Two times a day (BID) | ORAL | Status: DC

## 2018-09-13 MED ORDER — CEFDINIR 300 MG PO CAPS: 300.0000 mg | ORAL_CAPSULE | Freq: Two times a day (BID) | ORAL | 0 refills | Status: AC

## 2018-09-13 NOTE — Progress Notes (Signed)
Patient discharged to home. Verbalizes understanding of all discharge instructions including discharge medications and follow up MD visits.  

## 2018-09-13 NOTE — Evaluation (Signed)
Physical Therapy Evaluation Patient Details Name: Julie Obrien MRN: [id number] DOB: 06-15-1951 Today's Date: 09/13/2018   History of Present Illness  68 y.o. female past medical history significant for tobacco abuse possible COPD recently seen at her PCPs office for shortness of breath chest x-ray was done and influenza PCR, which they both came back today which were positive. Ambulating desaturation in ED. PMH: OA in biliateral hips and knees  Clinical Impression  Patient evaluated by Physical Therapy with no further acute PT needs identified. All education has been completed and the patient has no further questions. Pt requiring only mod I assistance with bed mobility, transfers and ambulation without AD. SaO2 on RA with ambulation 92%O2. See below for any follow-up Physical Therapy or equipment needs. PT is signing off. Thank you for this referral.     Follow Up Recommendations No PT follow up    Equipment Recommendations  None recommended by PT       Precautions / Restrictions Precautions Precautions: None Restrictions Weight Bearing Restrictions: No      Mobility  Bed Mobility Overal bed mobility: Modified Independent             General bed mobility comments: HoB elevated  Transfers Overall transfer level: Independent               General transfer comment: good power up and steadying  Ambulation/Gait Ambulation/Gait assistance: Modified independent (Device/Increase time) Gait Distance (Feet): 40 Feet Assistive device: IV Pole Gait Pattern/deviations: Step-through pattern;Decreased step length - right;Decreased step length - left Gait velocity: slowed Gait velocity interpretation: >2.62 ft/sec, indicative of community ambulatory General Gait Details: Pt pushed IV pole but was not dependent on it for support      Balance Overall balance assessment: No apparent balance deficits (not formally assessed)                                            Pertinent Vitals/Pain Pain Assessment: No/denies pain    Home Living Family/patient expects to be discharged to:: Private residence Living Arrangements: Spouse/significant other;Other relatives Available Help at Discharge: Family;Available 24 hours/day Type of Home: House Home Access: Stairs to enter   [employer] of Steps: 1 Home Layout: Two level;1/2 bath on main level;Bed/bath upstairs Home Equipment: Walker - 2 wheels      Prior Function Level of Independence: Independent         Comments: takes care of 28 year old grandaughter        Extremity/Trunk Assessment   Upper Extremity Assessment Upper Extremity Assessment: Overall WFL for tasks assessed    Lower Extremity Assessment Lower Extremity Assessment: Overall WFL for tasks assessed    Cervical / Trunk Assessment Cervical / Trunk Assessment: Kyphotic  Communication   Communication: No difficulties  Cognition Arousal/Alertness: Awake/alert Behavior During Therapy: WFL for tasks assessed/performed Overall Cognitive Status: Within Functional Limits for tasks assessed                                        General Comments General comments (skin integrity, edema, etc.): SaO2 on RA with ambulation 92%O2        Assessment/Plan    PT Assessment Patent does not need any further PT services  PT Problem List  PT Treatment Interventions      PT Goals (Current goals can be found in the Care Plan section)  Acute Rehab PT Goals Patient Stated Goal: go home to take care on of grandaughter PT Goal Formulation: With patient Time For Goal Achievement: 09/27/18 Potential to Achieve Goals: Good     AM-PAC PT "6 Clicks" Mobility  Outcome Measure Help needed turning from your back to your side while in a flat bed without using bedrails?: None Help needed moving from lying on your back to sitting on the side of a flat bed without using bedrails?: None Help needed  moving to and from a bed to a chair (including a wheelchair)?: None Help needed standing up from a chair using your arms (e.g., wheelchair or bedside chair)?: None Help needed to walk in hospital room?: None Help needed climbing 3-5 steps with a railing? : A Little 6 Click Score: 23    End of Session Equipment Utilized During Treatment: Gait belt Activity Tolerance: Patient tolerated treatment well Patient left: in chair;with call bell/phone within reach Nurse Communication: Mobility status PT Visit Diagnosis: Difficulty in walking, not elsewhere classified (R26.2)    Time: 5638-9373 PT Time Calculation (min) (ACUTE ONLY): 32 min   Charges:   PT Evaluation $PT Eval Low Complexity: 1 Low PT Treatments $Gait Training: 8-22 mins        Johnwilliam Shepperson B. Migdalia Dk PT, DPT Acute Rehabilitation Services Pager [phone] Office [phone]   [place] 09/13/2018, 9:24 AM

## 2018-09-13 NOTE — Care Management Obs Status (Signed)
[facility] NOTIFICATION   Patient Details  Name: Julie Obrien MRN: [id number] Date of Birth: 16-Oct-1950   Medicare Observation Status Notification Given:       Marilu Favre, RN 09/13/2018, 11:17 AM

## 2018-09-15 DIAGNOSIS — Z720 Tobacco use: Secondary | ICD-10-CM | POA: Diagnosis present

## 2018-09-15 DIAGNOSIS — F17200 Nicotine dependence, unspecified, uncomplicated: Secondary | ICD-10-CM | POA: Diagnosis present

## 2018-09-15 DIAGNOSIS — R0902 Hypoxemia: Secondary | ICD-10-CM | POA: Diagnosis present

## 2018-09-15 DIAGNOSIS — J189 Pneumonia, unspecified organism: Secondary | ICD-10-CM | POA: Diagnosis present

## 2018-09-15 NOTE — Discharge Summary (Addendum)
Julie Obrien, is a 68 y.o. female  DOB [birth year]  MRN [id number].  Admission date:  09/12/2018  Admitting Physician  Charlynne Cousins, MD  Discharge Date:  09/13/2018   Primary MD  Glendale Chard, MD  Recommendations for primary care physician for things to follow:     Discharge Diagnosis    Principal Problem:   Influenza A Active Problems:   COPD exacerbation ([place])   Hypoxia   Tobacco abuse   CAP (community acquired pneumonia)      Past Medical History:  Diagnosis Date  . Arthritis    hnp, low back , knees - " all OA"  . Complication of anesthesia    nausea one surgery more recent  . History of kidney stones   . Pneumonia    Feb.- Mar. 2017, not hospitalized, but pending surgery was cancelled ,since then, pt. seen by PCP, told that it was cleared    . PONV (postoperative nausea and vomiting)     Past Surgical History:  Procedure Laterality Date  . BACK SURGERY  08  . CERVICAL DISCECTOMY  09  . CESAREAN SECTION     x4  . CYST EXCISION     vocal cords?  . CYSTO  99   stone  . HARDWARE REMOVAL N/A 06/21/2015   Procedure: Exploration of Fusion and removal of hardware right thoracic level one;  Surgeon: Kary Kos, MD;  Location: [facility];  Service: Neurosurgery;  Laterality: N/A;  . POSTERIOR CERVICAL FUSION/FORAMINOTOMY N/A 05/31/2015   Procedure: Decompression and Laminectomy Cervical three-four  Cervical six-seven  Cervical seven-thoracic-one Posterior cervical fusion with lateral mass fixation;  Surgeon: Kary Kos, MD;  Location: [facility];  Service: Neurosurgery;  Laterality: N/A;  . TONSILLECTOMY    . TUBAL LIGATION         HPI  from the history and physical done on the day of admission:    Julie Obrien is a 68 y.o. female past medical history significant for tobacco abuse  possible COPD recently seen at her PCPs office for shortness of breath chest x-ray was done and influenza PCR, which they both came back today which were positive, she came in here to the ED and she was de-satting with ambulation, the patient relates she has been short of breath but not coughing, she has been having fevers at home.  And Tylenol but has been feeling more weak with decreased appetite and decreased oral intake.    Hospital Course:   1. Influenza A with post influenza pneumonia: Acute.  Patient reports recent contact with family member with influenza for which she had been sent in a prescription for Tamiflu starting on 1/29.  Patient has been taking medication as advised.  Admission CBC within normal limits.  Chest x-ray revealed signs of a possible right upper lobe pneumonia versus atelectasis.  Patient had been given 1 dose of Rocephin IV.  Patient was given oral cefdinir for possibility of developing post influenza pneumonia and advised to complete Tamiflu course.  2.  Asthma/COPD exacerbation with hypoxia: Resolved.  Patient was noted to initially be hypoxic with ambulation.   Patient had already been on steroids given earlier in the week.  No wheezing currently on physical exam and patient able to maintain O2 saturations.  Recommended completing course of steroids previously prescribed.  3.  Tobacco abuse: Patient reports still continuing to smoke.  Counseled her on the need of cessation of tobacco use.   Follow UP     Consults obtained: None  Discharge Condition: Stable  Diet and Activity recommendation: See Discharge Instructions below  Discharge Instructions    Discharge instructions   Complete by:  As directed    You were diagnosed with influenza A and signs of a early developing pneumonia.  Please complete prescriptions for Tamiflu and prednisone that you already have at home. I have sent a prescription for Omnicef to your pharmacy.  It is antibiotic to treat for a  post influenza pneumonia.  We advised while taking this antibiotic that you keep yourself adequately hydrated and take a probiotic like "nature's bounty acidophilis probiotic tablets".  Please follow-up with your primary care provider within 1 week since being discharged from the hospital.  I strongly advised that you get the influenza vaccine next year.  Lab work including CBC and BMP were normal during your hospitalization.   Increase activity slowly   Complete by:  As directed         Discharge Medications     Allergies as of 09/13/2018      Reactions   Augmentin [amoxicillin-pot Clavulanate] Nausea And Vomiting   "Tore up my stomach" (verified this with her daughter, a physician)   Sulfa Antibiotics Hives      Medication List    STOP taking these medications   azithromycin 250 MG tablet Commonly known as:  ZITHROMAX Z-PAK   Vitamin D (Ergocalciferol) 1.25 MG (50000 UT) Caps capsule Commonly known as:  DRISDOL     TAKE these medications   albuterol 108 (90 Base) MCG/ACT inhaler Commonly known as:  VENTOLIN HFA Inhale 1-2 puffs into the lungs every 4 (four) hours as needed for wheezing or shortness of breath.   cefdinir 300 MG capsule Commonly known as:  OMNICEF Take 1 capsule (300 mg total) by mouth 2 (two) times daily for 4 days.   HYDROcodone-homatropine 5-1.5 MG/5ML syrup Commonly known as:  HYDROMET Take 5 mLs by mouth every 6 (six) hours as needed.   ibuprofen 800 MG tablet Commonly known as:  ADVIL,MOTRIN Take 800 mg by mouth every 8 (eight) hours as needed (for pain).   naproxen sodium 220 MG tablet Commonly known as:  ALEVE Take 220-440 mg by mouth 2 (two) times daily as needed (for pain or headaches).   ondansetron 4 MG disintegrating tablet Commonly known as:  ZOFRAN-ODT Take 4 mg by mouth every 8 (eight) hours as needed for nausea or vomiting.   oseltamivir 30 MG capsule Commonly known as:  TAMIFLU Take 1 capsule (30 mg total) by mouth 2 (two) times  daily. What changed:    medication strength  how much to take  when to take this       Major procedures and Radiology Reports - PLEASE review detailed and final reports for all details, in brief -     Dg Chest 2 View  Result Date: 09/12/2018 CLINICAL DATA:  Cough. EXAM: CHEST - 2 VIEW COMPARISON:  Multiple chest x-rays since February 11, 2018 FINDINGS: The opacity in the right mid  lung is somewhat platelike and more prominent the interval. Suggested subtle opacity in the right base, not seen yesterday. No other interval changes. IMPRESSION: 1. The platelike opacity in the right upper lobe may represent atelectasis. Infiltrate not excluded. 2. Subtle increased opacity in the right base is worrisome for early pneumonia given history. 3. No other acute abnormalities. Electronically Signed   By: Dorise Bullion III M.D   On: 09/12/2018 16:49   Dg Chest 2 View  Result Date: 09/11/2018 CLINICAL DATA:  Cough, fever, positive flu test today, eval for pneumonia EXAM: CHEST - 2 VIEW COMPARISON:  07/24/2018 FINDINGS: poorly marginated patchy airspace opacities laterally and posteriorly in the right upper lung. Chronic coarse perihilar bronchovascular markings. No overt edema. Heart size and mediastinal contours are within normal limits. No effusion. Anterior vertebral endplate spurring at multiple levels in the mid and lower thoracic spine. Cervical and lumbar fixation hardware partially visualized. IMPRESSION: Patchy right upper lobe airspace opacities suggesting early pneumonia. Electronically Signed   By: Lucrezia Europe M.D.   On: 09/11/2018 16:27    Micro Results   No results found for this or any previous visit (from the past 240 hour(s)).     Today   Subjective    Julie Obrien today states that she feels fine and has no complaints.   Objective   Blood pressure 129/75, pulse 74, temperature 98.2 F (36.8 C), temperature source Oral, resp. rate 18, height 4' 6.25" (1.378 m), weight 85.5  kg, SpO2 95 %.  No intake or output data in the 24 hours ending 09/15/18 1040  Exam  Constitutional: Elderly female in NAD, calm, comfortable Eyes: PERRL, lids and conjunctivae normal ENMT: Mucous membranes are moist. Posterior pharynx clear of any exudate or lesions.  Neck: normal, supple, no masses, no thyromegaly Respiratory: clear to auscultation bilaterally, no wheezing, no crackles. Normal respiratory effort. No accessory muscle use.  Cardiovascular: Regular rate and rhythm, no murmurs / rubs / gallops. No extremity edema. 2+ pedal pulses. No carotid bruits.  Abdomen: no tenderness, no masses palpated. No hepatosplenomegaly. Bowel sounds positive.  Musculoskeletal: no clubbing / cyanosis. No joint deformity upper and lower extremities. Good ROM, no contractures. Normal muscle tone.  Skin: no rashes, lesions, ulcers. No induration Neurologic: CN 2-12 grossly intact.. Strength 5/5 in all 4.  Psychiatric: Normal judgment and insight. Alert and oriented x 3. Normal mood.    Data Review   CBC w Diff:  Lab Results  Component Value Date   WBC 6.8 09/12/2018   HGB 12.2 09/12/2018   HCT 37.9 09/12/2018   PLT 264 09/12/2018   LYMPHOPCT 18 09/12/2018   MONOPCT 3 09/12/2018   EOSPCT 0 09/12/2018   BASOPCT 0 09/12/2018    CMP:  Lab Results  Component Value Date   NA 137 09/12/2018   K 3.9 09/12/2018   CL 103 09/12/2018   CO2 23 09/12/2018   BUN 10 09/12/2018   CREATININE 0.82 09/12/2018   PROT 6.2 (L) 02/13/2018   ALBUMIN 3.1 (L) 02/13/2018   BILITOT 0.3 02/13/2018   ALKPHOS 74 02/13/2018   AST 39 02/13/2018   ALT 19 02/13/2018  .   Total Time in preparing paper work, data evaluation and todays exam - 35 minutes  Norval Morton M.D on 09/15/2018 at 10:40 AM  [facility]   Office  [phone]

## 2018-09-20 ENCOUNTER — Ambulatory Visit: Payer: Medicare Other | Admitting: Internal Medicine

## 2018-09-23 ENCOUNTER — Ambulatory Visit: Payer: Medicare Other | Admitting: Internal Medicine

## 2018-09-26 ENCOUNTER — Encounter: Payer: Self-pay | Admitting: Internal Medicine

## 2018-09-26 ENCOUNTER — Ambulatory Visit (INDEPENDENT_AMBULATORY_CARE_PROVIDER_SITE_OTHER): Payer: Medicare Other | Admitting: Internal Medicine

## 2018-09-26 VITALS — BP 112/74 | HR 73 | Temp 98.5°F | Ht 64.4 in | Wt 189.0 lb

## 2018-09-26 DIAGNOSIS — Z87898 Personal history of other specified conditions: Secondary | ICD-10-CM | POA: Diagnosis not present

## 2018-09-26 DIAGNOSIS — M25551 Pain in right hip: Secondary | ICD-10-CM | POA: Diagnosis not present

## 2018-09-26 DIAGNOSIS — E6609 Other obesity due to excess calories: Secondary | ICD-10-CM | POA: Diagnosis not present

## 2018-09-26 DIAGNOSIS — M25552 Pain in left hip: Secondary | ICD-10-CM | POA: Diagnosis not present

## 2018-09-26 DIAGNOSIS — R519 Headache, unspecified: Secondary | ICD-10-CM

## 2018-09-26 DIAGNOSIS — Z09 Encounter for follow-up examination after completed treatment for conditions other than malignant neoplasm: Secondary | ICD-10-CM | POA: Diagnosis not present

## 2018-09-26 DIAGNOSIS — E2839 Other primary ovarian failure: Secondary | ICD-10-CM | POA: Diagnosis not present

## 2018-09-26 DIAGNOSIS — J129 Viral pneumonia, unspecified: Secondary | ICD-10-CM

## 2018-09-26 DIAGNOSIS — R51 Headache: Secondary | ICD-10-CM | POA: Diagnosis not present

## 2018-09-26 DIAGNOSIS — Z720 Tobacco use: Secondary | ICD-10-CM

## 2018-09-26 DIAGNOSIS — Z6832 Body mass index (BMI) 32.0-32.9, adult: Secondary | ICD-10-CM | POA: Diagnosis not present

## 2018-09-26 DIAGNOSIS — R5383 Other fatigue: Secondary | ICD-10-CM

## 2018-09-26 DIAGNOSIS — F17210 Nicotine dependence, cigarettes, uncomplicated: Secondary | ICD-10-CM

## 2018-09-26 MED ORDER — KETOROLAC TROMETHAMINE 60 MG/2ML IM SOLN
60.0000 mg | Freq: Once | INTRAMUSCULAR | Status: AC
  Administered 2018-09-26: 60 mg via INTRAMUSCULAR

## 2018-09-26 MED ORDER — TRAMADOL HCL 50 MG PO TABS: 50.0000 mg | ORAL_TABLET | Freq: Four times a day (QID) | ORAL | 0 refills | Status: DC | PRN

## 2018-09-26 NOTE — Patient Instructions (Signed)

## 2018-09-27 DIAGNOSIS — M545 Low back pain: Secondary | ICD-10-CM | POA: Diagnosis not present

## 2018-09-27 DIAGNOSIS — M7062 Trochanteric bursitis, left hip: Secondary | ICD-10-CM | POA: Diagnosis not present

## 2018-09-27 DIAGNOSIS — M7061 Trochanteric bursitis, right hip: Secondary | ICD-10-CM | POA: Diagnosis not present

## 2018-10-13 DIAGNOSIS — Z6832 Body mass index (BMI) 32.0-32.9, adult: Secondary | ICD-10-CM | POA: Insufficient documentation

## 2018-10-13 NOTE — Progress Notes (Signed)
Subjective:     Patient ID: Julie Obrien , female    DOB: 16-Apr-1951 , 68 y.o.   MRN: [id number]   Chief Complaint  Patient presents with  . Hospitalization Follow-up    HPI  She is here today for hospital f/u.  She was admitted to [facility] on 1/30 for sob/fatigue. She was found to have post-influenza pneumonia and had hypoxia with ambulation. Marland Kitchen She was given iv/oral abx and iv fluids. She was discharged in stable condition on 1/31. She is feeling slightly better since discharge. She is still fatigued.     Past Medical History:  Diagnosis Date  . Arthritis    hnp, low back , knees - " all OA"  . Complication of anesthesia    nausea one surgery more recent  . History of kidney stones   . Pneumonia    Feb.- Mar. 2017, not hospitalized, but pending surgery was cancelled ,since then, pt. seen by PCP, told that it was cleared    . PONV (postoperative nausea and vomiting)      Family History  Problem Relation Age of Onset  . Dementia Mother   . Cancer Mother   . Cancer Sister   . Cancer Maternal Aunt      Current Outpatient Medications:  .  Acetaminophen (TYLENOL ARTHRITIS EXT RELIEF PO), Take by mouth., Disp: , Rfl:  .  albuterol (VENTOLIN HFA) 108 (90 Base) MCG/ACT inhaler, Inhale 1-2 puffs into the lungs every 4 (four) hours as needed for wheezing or shortness of breath., Disp: 1 Inhaler, Rfl: 3 .  Ibuprofen-diphenhydrAMINE Cit (ADVIL PM PO), Take by mouth., Disp: , Rfl:  .  HYDROcodone-homatropine (HYDROMET) 5-1.5 MG/5ML syrup, Take 5 mLs by mouth every 6 (six) hours as needed. (Patient not taking: Reported on 09/26/2018), Disp: 120 mL, Rfl: 0 .  traMADol (ULTRAM) 50 MG tablet, Take 1 tablet (50 mg total) by mouth every 6 (six) hours as needed., Disp: 20 tablet, Rfl: 0   Allergies  Allergen Reactions  . Augmentin [Amoxicillin-Pot Clavulanate] Nausea And Vomiting    "Tore up my stomach" (verified this with her daughter, a physician)  . Sulfa Antibiotics Hives     Review  of Systems  Constitutional: Positive for fatigue.  Respiratory: Negative.   Cardiovascular: Negative.   Gastrointestinal: Negative.   Musculoskeletal: Positive for arthralgias (she c/o b/l hip pain).  Neurological: Negative.   Psychiatric/Behavioral: Negative.      Today's Vitals   09/26/18 1149  BP: 112/74  Pulse: 73  Temp: 98.5 F (36.9 C)  TempSrc: Oral  SpO2: 95%  Weight: 189 lb (85.7 kg)  Height: 5' 4.4" (1.636 m)   Body mass index is 32.04 kg/m.   Objective:  Physical Exam Vitals signs and nursing note reviewed.  Constitutional:      Appearance: Normal appearance.     Comments: Appears fatigued  HENT:     Head: Normocephalic and atraumatic.  Cardiovascular:     Rate and Rhythm: Normal rate and regular rhythm.     Heart sounds: Normal heart sounds.  Pulmonary:     Effort: Pulmonary effort is normal.     Breath sounds: Normal breath sounds.  Skin:    General: Skin is warm.  Neurological:     General: No focal deficit present.     Mental Status: She is alert.  Psychiatric:        Mood and Affect: Mood normal.        Behavior: Behavior normal.  Assessment And Plan:     1. Viral pneumonia  Unfortunately, she had a post-influenza PNA. DISCHARGE SUMMARY WAS REVIEWED IN FULL DETAIL DURING THE VISIT. MEDS RECONCILED AND COMPARED TO DISCHARGE MEDS. MEDICATION LIST WAS UPDATED AND REVIEWED WITH THE PATIENT. GREATER THAN 50% FACE TO FACE TIME WAS SPENT IN COUNSELING AND COORDINATION OF CARE. ALL QUESTIONS WERE ANSWERED TO THE SATISFACTION OF THE PATIENT.    2. Cigarette nicotine dependence without complication  Importance of smoking cessation was discussed with the patient. She has no plans to resume smoking at this time.   3. Estrogen deficiency  I will refer for bone density. I will schedule for same time as her mammogram at [facility], August 2020.   4. Class 1 obesity due to excess calories w/o serious comorbidity with Adult BMI 32.0-32.9 kg/sq  m  Importance of achieving optimal weight to decrease risk of cardiovascular disease and cancers was discussed with the patient in full detail. She is encouraged to start slowly - start with 10 minutes twice daily at least three to four days per week and to gradually build to 30 minutes five days weekly. She was given tips to incorporate more activity into her daily routine - take stairs when possible, park farther away from her job, grocery stores, etc.   5. Acute nonintractable headache, unspecified headache type  I will refer her for sleep study. Risks associated with untreated OSA was discussed with the patient.   6. Fatigue, unspecified type  She is recovering from pneumonia. Patient advised it may take 4-6 weeks for her to fully recover. I will refer her to Neuro for sleep study.   - Ambulatory referral to Neurology  7. Pain of both hip joints  Pt advised she likely has osteoarthritis. She plans to go to Ortho urgent care for further evaluation. She was also given rx tramadol prn.    - ketorolac (TORADOL) injection 60 mg        Maximino Greenland, MD

## 2018-11-04 ENCOUNTER — Institutional Professional Consult (permissible substitution): Payer: Medicare Other | Admitting: Neurology

## 2018-12-03 ENCOUNTER — Telehealth: Payer: Self-pay

## 2018-12-03 ENCOUNTER — Ambulatory Visit: Payer: Self-pay

## 2018-12-03 DIAGNOSIS — Z6832 Body mass index (BMI) 32.0-32.9, adult: Secondary | ICD-10-CM

## 2018-12-03 DIAGNOSIS — E2839 Other primary ovarian failure: Secondary | ICD-10-CM

## 2018-12-03 DIAGNOSIS — J181 Lobar pneumonia, unspecified organism: Secondary | ICD-10-CM

## 2018-12-03 DIAGNOSIS — J189 Pneumonia, unspecified organism: Secondary | ICD-10-CM

## 2018-12-03 DIAGNOSIS — F17210 Nicotine dependence, cigarettes, uncomplicated: Secondary | ICD-10-CM

## 2018-12-03 DIAGNOSIS — E6609 Other obesity due to excess calories: Secondary | ICD-10-CM

## 2018-12-03 NOTE — Chronic Care Management (AMB) (Signed)
  Chronic Care Management   Telephone Outreach Note  12/03/2018 Name: Julie Obrien MRN: [id number] DOB: [birth year]  Referred by: patient's health plan.   I reached out to Ms. Signe Colt today by phone in response to a referral sent by Ms. Ninfa Linden health plan.   Ms. Sardo was given information about Chronic Care Management services today including:  1. CCM service includes personalized support from designated clinical staff supervised by her physician, including individualized plan of care and coordination with other care providers 2. 24/7 contact phone numbers for assistance for urgent and routine care needs. 3. Service will only be billed when office clinical staff spend 20 minutes or more in a month to coordinate care. 4. Only one practitioner may furnish and bill the service in a calendar month. 5. The patient may stop CCM services at any time (effective at the end of the month) by phone call to the office staff. 6. The patient will be responsible for cost sharing (co-pay) of up to 20% of the service fee (after annual deductible is met).  Patient agreed to services and verbal consent obtained.    SDOH (Social Determinants of Health) Screening completed during today's call. The patient has no current challenges she would like assistance with. The patient reports tobacco use of 0.5 pack per day. The patient acknowledges she would like to quite but is not ready stating "I know what I need to do I am just not ready to do it". The patient has had negative experience with medication aids. SW encouraged the patient to speak with CCM nurse case manager should she change her mind about readiness to quit.   SW briefly discussed COVID-19 with the patient and precautions to take while in public. The patient reports she has been back and forth between [place] and [place] to assist her two adult daughters with childcare while they continue to work. The patient reports not being  exposed to the public except one time while out of town in [place]. The patient has been home since April 13th and reports she has only left the home once. The patients husband continues to work in a group home but utilizes PPE when in the workplace. The patient also acknowledges she has a mask and gloves and will utilize if/when she is around the public.  No further SW follow up needed at this time. The patient has enrolled in the CCM program and understands she may contact SW at any time in the future for assistance. The patient will be outreached by the CCM RN Case Manager over the next 1-2 weeks.  Glendale Chard, MD has been notified of this outreach and Ms. ANALYAH MCCONNON decision and plan.   Daneen Schick, BSW, CDP [facility] Care Management Social Worker [phone]  Total time spent performing care coordination and/or care management activities with the patient by phone or face to face = 20 minutes.

## 2018-12-03 NOTE — Telephone Encounter (Signed)
Due to current COVID 19 pandemic, our office is severely reducing in office visits for at least the next 2 weeks, in order to minimize the risk to our patients and healthcare providers.   I called pt, offered her a virtual sleep consult visit at her appt next week. Pt is agreeable.  Pt understands that although there may be some limitations with this type of visit, we will take all precautions to reduce any security or privacy concerns.  Pt understands that this will be treated like an in office visit and we will file with pt's insurance, and there may be a patient responsible charge related to this service.  Pt's email is [email or web address]. Pt understands that the [employer] webex software must be downloaded and operational on the device pt plans to use for the visit.  Pt's meds, allergies, and PMH were updated.  Pt has never had a sleep study, does not endorse snoring.  Pt was instructed on how to measure her neck size prior to her appt.  Pt's recent weight is 189 lb and she is 5 4.5.  Epworth Sleepiness Scale 0= would never doze 1= slight chance of dozing 2= moderate chance of dozing 3= high chance of dozing  Sitting and reading: 0 Watching TV: 1 Sitting inactive in a public place (ex. Theater or meeting): 0 As a passenger in a car for an hour without a break: 0 Lying down to rest in the afternoon: 1 Sitting and talking to someone: 0 Sitting quietly after lunch (no alcohol): 0 In a car, while stopped in traffic: 0 Total: 2  FSS: 11  Pt was able to connect successfully with me in a virtual test visit.

## 2018-12-03 NOTE — Patient Instructions (Signed)
Social Worker Visit Information  Materials provided: Verbal education about CCM Program provided by phone  Ms. Roussin was given information about Chronic Care Management services today including:  1. CCM service includes personalized support from designated clinical staff supervised by her physician, including individualized plan of care and coordination with other care providers 2. 24/7 contact phone numbers for assistance for urgent and routine care needs. 3. Service will only be billed when office clinical staff spend 20 minutes or more in a month to coordinate care. 4. Only one practitioner may furnish and bill the service in a calendar month. 5. The patient may stop CCM services at any time (effective at the end of the month) by phone call to the office staff. 6. The patient will be responsible for cost sharing (co-pay) of up to 20% of the service fee (after annual deductible is met).  Patient agreed to services and verbal consent obtained.   The patient verbalized understanding of instructions provided today and declined a print copy of patient instruction materials.   Follow up plan: No further SW follow up needed at this time.    Daneen Schick, BSW, CDP [facility] Care Management Social Worker [phone]

## 2018-12-12 ENCOUNTER — Encounter: Payer: Self-pay | Admitting: Neurology

## 2018-12-12 ENCOUNTER — Other Ambulatory Visit: Payer: Self-pay

## 2018-12-12 ENCOUNTER — Ambulatory Visit (INDEPENDENT_AMBULATORY_CARE_PROVIDER_SITE_OTHER): Payer: Medicare Other | Admitting: Neurology

## 2018-12-12 DIAGNOSIS — R5383 Other fatigue: Secondary | ICD-10-CM | POA: Diagnosis not present

## 2018-12-12 DIAGNOSIS — G4700 Insomnia, unspecified: Secondary | ICD-10-CM

## 2018-12-12 DIAGNOSIS — Z8701 Personal history of pneumonia (recurrent): Secondary | ICD-10-CM | POA: Diagnosis not present

## 2018-12-12 DIAGNOSIS — E669 Obesity, unspecified: Secondary | ICD-10-CM

## 2018-12-12 DIAGNOSIS — G479 Sleep disorder, unspecified: Secondary | ICD-10-CM

## 2018-12-12 NOTE — Patient Instructions (Signed)
Given verbally, during today's virtual video-based encounter, with verbal feedback received.   

## 2018-12-12 NOTE — Progress Notes (Signed)
Star Age, MD, PhD [facility] [address] [place], [place] [address]   Virtual Visit via Video Note on 12/12/2018:  I connected with Julie Obrien on 12/12/18 at  9:00 AM EDT by a video enabled telemedicine application and verified that I am speaking with the correct person using two identifiers.   I discussed the limitations of evaluation and management by telemedicine and the availability of in person appointments. The patient expressed understanding and agreed to proceed.  History of Present Illness: Julie Obrien is a 68 year old right-handed woman with an underlying medical history of arthritis, status post back surgery, neck surgery, history of pneumonia, history of kidney stones, smoking and obesity, with whom I am conducting a virtual, video based new patient visit via Webex in lieu of a face-to-face visit for evaluation of her sleep disorder, in particular, evaluation for underlying obstructive sleep apnea. The patient is unaccompanied today and joins via iPad from home, I am in my office. She is referred by her primary care physician, Dr. Glendale Chard, and I reviewed her note from 09/26/2018. Of note, patient was hospitalized in late January 2020 for shortness of breath and was diagnosed with the flu and pneumonia with complication of hypoxia and she was diagnosed with COPD exacerbation. Her Epworth sleepiness score is 2 out of 24, fatigue severity score is 11 out of 63.  She has difficulty with sleep maintenance, has been going on for at least 6 mo, no triggers. She is not sure that she snores, her husband does not complain about it. She does not have night to night nocturia but has had the occasional morning headaches. She does suffer from pain at night, particularly hip pain, she has been diagnosed with hip bursitis on the left and has received injection into the hip and also lower back. She sees Dr. Brien Few. She has tried muscle  relaxers for pain. She is currently no longer taking tramadol or muscle relaxers, has found a topical oil with essential oils that seems to help quite well. She is trying to quit smoking, smokes about 8-10 cigarettes per day, tried Wellbutrin and Chantix, the latter caused abnormal dreams. She is retired, worked for [employer] for 30 years and retired in 2007. She drinks caffeine in limitation, 1 cup of coffee per day on average, currently no alcohol.  She has 2 daughters and 2 sons. Her 20-year-old granddaughter stays with her. She has a TV in the bedroom and watches TV at night, sometimes late until 2 AM. Bedtime is generally around 11 but she usually watches TV first. Rise time is between 9 and 10 but can be as late as 11, depending on how well she slept. She has trouble going back to sleep when she wakes up in the middle of the night. She had a tonsillectomy in her 89s. She is not aware of any family history of OSA. She has not been formally diagnosed with COPD but does report pneumonia and tendency towards bronchitis when she has a cold.  Her Past Medical History Is Significant For: Past Medical History:  Diagnosis Date   Arthritis    hnp, low back , knees - " all OA"   Complication of anesthesia    nausea one surgery more recent   History of kidney stones    Pneumonia    Feb.- Mar. 2017, not hospitalized, but pending surgery was cancelled ,since then, pt. seen by PCP, told that it was cleared  PONV (postoperative nausea and vomiting)     Her Past Surgical History Is Significant For: Past Surgical History:  Procedure Laterality Date   BACK SURGERY  08   CERVICAL DISCECTOMY  09   CESAREAN SECTION     x4   CYST EXCISION     vocal cords?   CYSTO  99   stone   HARDWARE REMOVAL N/A 06/21/2015   Procedure: Exploration of Fusion and removal of hardware right thoracic level one;  Surgeon: Kary Kos, MD;  Location: [facility];  Service: Neurosurgery;  Laterality: N/A;    POSTERIOR CERVICAL FUSION/FORAMINOTOMY N/A 05/31/2015   Procedure: Decompression and Laminectomy Cervical three-four  Cervical six-seven  Cervical seven-thoracic-one Posterior cervical fusion with lateral mass fixation;  Surgeon: Kary Kos, MD;  Location: [facility];  Service: Neurosurgery;  Laterality: N/A;   TONSILLECTOMY     TUBAL LIGATION      Her Family History Is Significant For: Family History  Problem Relation Age of Onset   Dementia Mother    Cancer Mother    Cancer Sister    Cancer Maternal Aunt     Her Social History Is Significant For: Social History   Socioeconomic History   Marital status: Married    Spouse name: Not on file   Number of children: 4   Years of education: Not on file   Highest education level: Not on file  Occupational History   Not on file  Social Needs   Financial resource strain: Not hard at all   Food insecurity:    Worry: Never true    Inability: Never true   Transportation needs:    Medical: No    Non-medical: No  Tobacco Use   Smoking status: Current Every Day Smoker    Packs/day: 0.50    Years: 10.00    Pack years: 5.00    Types: Cigarettes   Smokeless tobacco: Current User   Tobacco comment: "I know I have to tell myself this is what I want to do but I am not ready to do that yet"  Substance and Sexual Activity   Alcohol use: No   Drug use: No   Sexual activity: Not on file  Lifestyle   Physical activity:    Days per week: Not on file    Minutes per session: Not on file   Stress: Only a little  Relationships   Social connections:    Talks on phone: More than three times a week    Gets together: More than three times a week    Attends religious service: More than 4 times per year    Active member of club or organization: Yes    Attends meetings of clubs or organizations: More than 4 times per year    Relationship status: Married  Other Topics Concern   Not on file  Social History Narrative    Not on file    Her Allergies Are:  Allergies  Allergen Reactions   Augmentin [Amoxicillin-Pot Clavulanate] Nausea And Vomiting    "Tore up my stomach" (verified this with her daughter, a physician)   Sulfa Antibiotics Hives  :   Her Current Medications Are:  Outpatient Encounter Medications as of 12/12/2018  Medication Sig   Acetaminophen (TYLENOL ARTHRITIS EXT RELIEF PO) Take by mouth.   albuterol (VENTOLIN HFA) 108 (90 Base) MCG/ACT inhaler Inhale 1-2 puffs into the lungs every 4 (four) hours as needed for wheezing or shortness of breath.   Ibuprofen-diphenhydrAMINE Cit (ADVIL  PM PO) Take by mouth.   traMADol (ULTRAM) 50 MG tablet Take 1 tablet (50 mg total) by mouth every 6 (six) hours as needed.   No facility-administered encounter medications on file as of 12/12/2018.   :   Review of Systems:  Out of a complete 14 point review of systems, all are reviewed and negative with the exception of these symptoms as listed below:  Observations/Objective: Her most recent vital signs on file for my review are from 09/26/2018: Blood pressure 112/74, pulse 73, temperature 98.5, weight 189 pounds for BMI of 32.04.         Her most recent weight at home by self report is 187 lb, neck circumference by self-report is 14.5 inches. On examination, she is pleasant, conversant, no acute distress. Face is symmetric with normal facial animation noted. She wears corrective eyeglasses. She is due for an eye examination she admits, eyeglasses are over 91 years old. Extraocular movements are well preserved in all directions without nystagmus noted. Speech is clear without dysarthria, hypophonia or voice tremor. Airway examination reveals a slightly smaller airway entry and redundant soft palate, otherwise benign findings, a Mallampati class I, tonsils absent. Shoulder shrug is normal, shoulder height equal. She stands without difficulty and walks without difficulty. She has no drift, no postural or action  tremor. Finger to nose testing is unremarkable. Muscle bulk appears unremarkable in the upper extremities.  Assessment and Plan: Julie Obrien is a 68 year old right-handed woman with an underlying medical history of arthritis, status post back surgery, neck surgery, history of pneumonia, history of kidney stones, smoking and obesity, with whom I am conducting a virtual, video based new patient visit via Webex in lieu of a face-to-face visit for evaluation of an underlying organic sleep disorder, in particular, concern for obstructive sleep apnea. The patient's medical history and physical exam (albeit limited with current video-based evaluation) are concerning for a diagnosis of obstructive sleep apnea. I discussed with the patient the diagnosis of OSA, its prognosis and treatment options. I explained in particular the risks and ramifications of untreated moderate to severe OSA, especially with respect to developing cardiovascular disease down the Road, including congestive heart failure, difficult to treat hypertension, cardiac arrhythmias, or stroke. Even type 2 diabetes has, in part, been linked to untreated OSA. Symptoms of untreated OSA may include daytime sleepiness, memory problems, mood irritability and mood disorder such as depression and anxiety, lack of energy, as well as recurrent headaches, especially morning headaches. We talked about smoking cessation and the importance of weight control. We talked about the importance of maintaining good sleep hygiene. I recommended the following at this time: home sleep test.  I explained the sleep test procedure to the patient and also outlined possible treatment options of OSA, including the use of a custom-made dental device (which would require a referral to a specialist dentist), upper airway surgical options, (such as UPPP, which would involve a referral to an ENT). I also explained the CPAP vs. AutoPAP treatment option to the patient, who indicated  that she would be willing to try CPAP if the need arises. I answered all her questions today and the patient was in agreement. I plan to see the patient back after the sleep study is completed and encouraged her to call with any interim questions, concerns, problems or updates.   Star Age, MD, PhD   Follow Up Instructions: 1. HST, sleep lab staff will reach out to patient to arrange for either sending the unit  to home address or a "drive by pickup" and for tutorial, making sure patient is comfortable with the unit and usage, and return of equipment, if necessary.  2. Consider AutoPap therapy, if home sleep test positive for obstructive sleep apnea, patient agreeable. 3. We talked about alternative treatment options and current limitations, due to virus pandemic.  4. Follow-up after starting AutoPap therapy, follow-up to be scheduled according to set-up date, typically within 31 to 89 days post treatment start. 5. Pursue healthy lifestyle, good sleep hygiene, healthy weight. 6. Call for any interim questions or concerns.    I discussed the assessment and treatment plan with the patient. The patient was provided an opportunity to ask questions and all were answered. The patient agreed with the plan and demonstrated an understanding of the instructions.   The patient was advised to call back or seek an in-person evaluation if the symptoms worsen or if the condition fails to improve as anticipated.  I provided 30 minutes of non-face-to-face time during this encounter.   Star Age, MD

## 2018-12-13 ENCOUNTER — Ambulatory Visit: Payer: Self-pay

## 2018-12-13 DIAGNOSIS — M25551 Pain in right hip: Secondary | ICD-10-CM

## 2018-12-13 DIAGNOSIS — F17210 Nicotine dependence, cigarettes, uncomplicated: Secondary | ICD-10-CM

## 2018-12-13 DIAGNOSIS — M25552 Pain in left hip: Secondary | ICD-10-CM

## 2018-12-13 DIAGNOSIS — J189 Pneumonia, unspecified organism: Secondary | ICD-10-CM

## 2018-12-13 DIAGNOSIS — E2839 Other primary ovarian failure: Secondary | ICD-10-CM

## 2018-12-13 DIAGNOSIS — J181 Lobar pneumonia, unspecified organism: Secondary | ICD-10-CM

## 2018-12-13 NOTE — Chronic Care Management (AMB) (Signed)
  Chronic Care Management   Outreach Note  12/13/2018 Name: Julie Obrien MRN: [id number] DOB: 03-Jan-1951  Referred by: Glendale Chard, MD Reason for referral : Chronic Care Management (CCM RN INITIAL Telephone Follow Up )   An unsuccessful telephone outreach was attempted today. The patient was referred to the case management team by Glendale Chard MD for assistance with chronic care management and care coordination. I left a HIPAA compliant voice message for this patient, requesting a return phone call.   Follow Up Plan: The CM team will reach out to the patient again over the next 5-7 days.      Barb Merino, RN,CCM Care Management Coordinator [place] [facility]  Direct Phone: [phone]

## 2018-12-20 ENCOUNTER — Telehealth: Payer: Self-pay

## 2018-12-23 ENCOUNTER — Ambulatory Visit: Payer: Self-pay

## 2018-12-23 DIAGNOSIS — E2839 Other primary ovarian failure: Secondary | ICD-10-CM

## 2018-12-23 DIAGNOSIS — R5383 Other fatigue: Secondary | ICD-10-CM

## 2018-12-23 NOTE — Chronic Care Management (AMB) (Signed)
  Chronic Care Management   Initial Visit Note  12/23/2018 Name: Julie Obrien MRN: [id number] DOB: 03-31-1951  Referred by: Glendale Chard, MD Reason for referral : Chronic Care Management (#2 INITIAL CCM RN TELEPHONE FOLLOW UP )   [name] is a 68 y.o. year old female who is a primary care patient of Glendale Chard, MD. The CCM team was consulted for assistance with chronic disease management and care coordination needs.   Review of patient status, including review of consultants reports, relevant laboratory and other test results, and collaboration with appropriate care team members and the patient's provider was performed as part of comprehensive patient evaluation and provision of chronic care management services.    I spoke with Julie Obrien by telephone today to establish her plan of care for CCM. Julie Obrien previously consented for CCM, however, she states she does not feel that she needs our assistance at this time.   The patient has been provided with contact information for the chronic care management team and has been advised to call with any health related questions or concerns.    Barb Merino, RN,CCM Care Management Coordinator [facility]  Direct Phone: [phone]

## 2019-01-15 DIAGNOSIS — M7918 Myalgia, other site: Secondary | ICD-10-CM | POA: Diagnosis not present

## 2019-01-15 DIAGNOSIS — M961 Postlaminectomy syndrome, not elsewhere classified: Secondary | ICD-10-CM | POA: Diagnosis not present

## 2019-01-15 DIAGNOSIS — M5023 Other cervical disc displacement, cervicothoracic region: Secondary | ICD-10-CM | POA: Diagnosis not present

## 2019-01-15 DIAGNOSIS — M461 Sacroiliitis, not elsewhere classified: Secondary | ICD-10-CM | POA: Diagnosis not present

## 2019-01-15 DIAGNOSIS — M47816 Spondylosis without myelopathy or radiculopathy, lumbar region: Secondary | ICD-10-CM | POA: Diagnosis not present

## 2019-01-15 DIAGNOSIS — M542 Cervicalgia: Secondary | ICD-10-CM | POA: Diagnosis not present

## 2019-01-15 DIAGNOSIS — R030 Elevated blood-pressure reading, without diagnosis of hypertension: Secondary | ICD-10-CM | POA: Diagnosis not present

## 2019-01-15 DIAGNOSIS — Z6832 Body mass index (BMI) 32.0-32.9, adult: Secondary | ICD-10-CM | POA: Diagnosis not present

## 2019-01-15 DIAGNOSIS — M5416 Radiculopathy, lumbar region: Secondary | ICD-10-CM | POA: Diagnosis not present

## 2019-01-27 ENCOUNTER — Telehealth: Payer: Self-pay

## 2019-01-27 NOTE — Telephone Encounter (Signed)
We have attempted to call the patient two times to schedule sleep study.  Patient has been unavailable at the phone numbers we have on file and has not returned our calls.  We have left detailed messages for patient to return call asap.  If patient calls back we will schedule them for their sleep study.

## 2019-02-19 DIAGNOSIS — Z20828 Contact with and (suspected) exposure to other viral communicable diseases: Secondary | ICD-10-CM | POA: Diagnosis not present

## 2019-02-19 DIAGNOSIS — Z6833 Body mass index (BMI) 33.0-33.9, adult: Secondary | ICD-10-CM | POA: Diagnosis not present

## 2019-03-05 DIAGNOSIS — U071 COVID-19: Secondary | ICD-10-CM | POA: Diagnosis not present

## 2019-04-22 ENCOUNTER — Other Ambulatory Visit: Payer: Self-pay

## 2019-04-23 ENCOUNTER — Encounter: Payer: Medicare Other | Admitting: Internal Medicine

## 2019-04-23 ENCOUNTER — Encounter: Payer: Self-pay | Admitting: Internal Medicine

## 2019-04-23 ENCOUNTER — Ambulatory Visit (INDEPENDENT_AMBULATORY_CARE_PROVIDER_SITE_OTHER): Payer: Medicare Other | Admitting: Internal Medicine

## 2019-04-23 ENCOUNTER — Ambulatory Visit (INDEPENDENT_AMBULATORY_CARE_PROVIDER_SITE_OTHER): Payer: Medicare Other

## 2019-04-23 VITALS — BP 124/64 | HR 74 | Temp 98.5°F | Ht 61.6 in | Wt 188.8 lb

## 2019-04-23 DIAGNOSIS — M65332 Trigger finger, left middle finger: Secondary | ICD-10-CM | POA: Diagnosis not present

## 2019-04-23 DIAGNOSIS — Z0000 Encounter for general adult medical examination without abnormal findings: Secondary | ICD-10-CM | POA: Diagnosis not present

## 2019-04-23 DIAGNOSIS — Z6834 Body mass index (BMI) 34.0-34.9, adult: Secondary | ICD-10-CM | POA: Diagnosis not present

## 2019-04-23 DIAGNOSIS — Z79899 Other long term (current) drug therapy: Secondary | ICD-10-CM

## 2019-04-23 DIAGNOSIS — E6609 Other obesity due to excess calories: Secondary | ICD-10-CM | POA: Diagnosis not present

## 2019-04-23 DIAGNOSIS — Z716 Tobacco abuse counseling: Secondary | ICD-10-CM | POA: Diagnosis not present

## 2019-04-23 DIAGNOSIS — Z23 Encounter for immunization: Secondary | ICD-10-CM

## 2019-04-23 DIAGNOSIS — M65342 Trigger finger, left ring finger: Secondary | ICD-10-CM

## 2019-04-23 DIAGNOSIS — M79642 Pain in left hand: Secondary | ICD-10-CM

## 2019-04-23 MED ORDER — PREVNAR 13 IM SUSP: 0.5000 mL | INTRAMUSCULAR | 0 refills | Status: AC

## 2019-04-23 MED ORDER — TRAMADOL HCL 50 MG PO TABS: 50.0000 mg | ORAL_TABLET | Freq: Two times a day (BID) | ORAL | 0 refills | Status: AC | PRN

## 2019-04-23 NOTE — Patient Instructions (Signed)
Trigger Finger  Trigger finger (stenosing tenosynovitis) is a condition that causes a finger to get stuck in a bent position. Each finger has a tough, cord-like tissue that connects muscle to bone (tendon), and each tendon is surrounded by a tunnel of tissue (tendon sheath). To move your finger, your tendon needs to slide freely through the sheath. Trigger finger happens when the tendon or the sheath thickens, making it difficult to move your finger. Trigger finger can affect any finger or a thumb. It may affect more than one finger. Mild cases may clear up with rest and medicine. Severe cases require more treatment. What are the causes? Trigger finger is caused by a thickened finger tendon or tendon sheath. The cause of this thickening is not known. What increases the risk? The following factors may make you more likely to develop this condition:  Doing activities that require a strong grip.  Having rheumatoid arthritis, gout, or diabetes.  Being 40-60 years old.  Being a woman. What are the signs or symptoms? Symptoms of this condition include:  Pain when bending or straightening your finger.  Tenderness or swelling where your finger attaches to the palm of your hand.  A lump in the palm of your hand or on the inside of your finger.  Hearing a popping sound when you try to straighten your finger.  Feeling a popping, catching, or locking sensation when you try to straighten your finger.  Being unable to straighten your finger. How is this diagnosed? This condition is diagnosed based on your symptoms and a physical exam. How is this treated? This condition may be treated by:  Resting your finger and avoiding activities that make symptoms worse.  Wearing a finger splint to keep your finger in a slightly bent position.  Taking NSAIDs to relieve pain and swelling.  Injecting medicine (steroids) into the tendon sheath to reduce swelling and irritation. Injections may need to be  repeated.  Having surgery to open the tendon sheath. This may be done if other treatments do not work and you cannot straighten your finger. You may need physical therapy after surgery. Follow these instructions at home:   Use moist heat to help reduce pain and swelling as told by your health care provider.  Rest your finger and avoid activities that make pain worse. Return to normal activities as told by your health care provider.  If you have a splint, wear it as told by your health care provider.  Take over-the-counter and prescription medicines only as told by your health care provider.  Keep all follow-up visits as told by your health care provider. This is important. Contact a health care provider if:  Your symptoms are not improving with home care. Summary  Trigger finger (stenosing tenosynovitis) causes your finger to get stuck in a bent position, and it can make it difficult and painful to straighten your finger.  This condition develops when a finger tendon or tendon sheath thickens.  Treatment starts with resting, wearing a splint, and taking NSAIDs.  In severe cases, surgery to open the tendon sheath may be needed. This information is not intended to replace advice given to you by your health care provider. Make sure you discuss any questions you have with your health care provider. Document Released: 05/20/2004 Document Revised: 07/13/2017 Document Reviewed: 07/11/2016 Elsevier Patient Education  2020 Elsevier Inc.  

## 2019-04-23 NOTE — Progress Notes (Signed)
Subjective:   Julie Obrien is a 68 y.o. female who presents for Medicare Annual (Subsequent) preventive examination.  Review of Systems:  n/a Cardiac Risk Factors include: advanced age (>57men, >75 women);sedentary lifestyle;obesity (BMI >30kg/m2)     Objective:     Vitals: BP 124/64 (BP Location: Left Arm, Patient Position: Sitting, Cuff Size: Normal)   Pulse 74   Temp 98.5 F (36.9 C) (Oral)   Ht 5' 1.6" (1.565 m)   Wt 188 lb 12.8 oz (85.6 kg)   SpO2 94%   BMI 34.98 kg/m   Body mass index is 34.98 kg/m.  Advanced Directives 04/23/2019 12/03/2018 09/12/2018 02/11/2018 07/19/2017 12/28/2015 10/13/2015  Does Patient Have a Medical Advance Directive? No No No No No No No  Would patient like information on creating a medical advance directive? - No - Patient declined No - Patient declined Yes (ED - Information included in AVS) No - Patient declined No - patient declined information -    Tobacco Social History   Tobacco Use  Smoking Status Current Every Day Smoker  . Packs/day: 0.50  . Years: 10.00  . Pack years: 5.00  . Types: Cigarettes  Smokeless Tobacco Current User  Tobacco Comment   "I know I have to tell myself this is what I want to do but I am not ready to do that yet"     Ready to quit: No Counseling given: Not Answered Comment: "I know I have to tell myself this is what I want to do but I am not ready to do that yet"   Clinical Intake:  Pre-visit preparation completed: Yes  Pain : 0-10 Pain Score: 5  Pain Type: Chronic pain Pain Location: Back Pain Orientation: Lower Pain Descriptors / Indicators: Sharp Pain Onset: More than a month ago Pain Frequency: Constant Pain Relieving Factors: tylenol helps  Pain Relieving Factors: tylenol helps  Nutritional Status: BMI > 30  Obese Nutritional Risks: None Diabetes: No  How often do you need to have someone help you when you read instructions, pamphlets, or other written materials from your doctor or  pharmacy?: 1 - Never What is the last grade level you completed in school?: 2 years college  Interpreter Needed?: No  Information entered by :: NAllen LPN  Past Medical History:  Diagnosis Date  . Arthritis    hnp, low back , knees - " all OA"  . Complication of anesthesia    nausea one surgery more recent  . History of kidney stones   . Pneumonia    Feb.- Mar. 2017, not hospitalized, but pending surgery was cancelled ,since then, pt. seen by PCP, told that it was cleared    . PONV (postoperative nausea and vomiting)    Past Surgical History:  Procedure Laterality Date  . BACK SURGERY  08  . CERVICAL DISCECTOMY  09  . CESAREAN SECTION     x4  . CYST EXCISION     vocal cords?  . CYSTO  99   stone  . HARDWARE REMOVAL N/A 06/21/2015   Procedure: Exploration of Fusion and removal of hardware right thoracic level one;  Surgeon: Kary Kos, MD;  Location: [place] [facility];  Service: Neurosurgery;  Laterality: N/A;  . POSTERIOR CERVICAL FUSION/FORAMINOTOMY N/A 05/31/2015   Procedure: Decompression and Laminectomy Cervical three-four  Cervical six-seven  Cervical seven-thoracic-one Posterior cervical fusion with lateral mass fixation;  Surgeon: Kary Kos, MD;  Location: [place] [facility];  Service: Neurosurgery;  Laterality: N/A;  . TONSILLECTOMY    .  TUBAL LIGATION     Family History  Problem Relation Age of Onset  . Dementia Mother   . Cancer Mother   . Cancer Sister   . Cancer Maternal Aunt    Social History   Socioeconomic History  . Marital status: Married    Spouse name: Not on file  . Number of children: 4  . Years of education: Not on file  . Highest education level: Not on file  Occupational History  . Occupation: retired  Scientific laboratory technician  . Financial resource strain: Not hard at all  . Food insecurity    Worry: Never true    Inability: Never true  . Transportation needs    Medical: No    Non-medical: No  Tobacco Use  . Smoking status: Current Every Day Smoker     Packs/day: 0.50    Years: 10.00    Pack years: 5.00    Types: Cigarettes  . Smokeless tobacco: Current User  . Tobacco comment: "I know I have to tell myself this is what I want to do but I am not ready to do that yet"  Substance and Sexual Activity  . Alcohol use: No  . Drug use: No  . Sexual activity: Not Currently  Lifestyle  . Physical activity    Days per week: 0 days    Minutes per session: 0 min  . Stress: Only a little  Relationships  . Social connections    Talks on phone: More than three times a week    Gets together: More than three times a week    Attends religious service: More than 4 times per year    Active member of club or organization: Yes    Attends meetings of clubs or organizations: More than 4 times per year    Relationship status: Married  Other Topics Concern  . Not on file  Social History Narrative  . Not on file    Outpatient Encounter Medications as of 04/23/2019  Medication Sig  . Acetaminophen (TYLENOL ARTHRITIS EXT RELIEF PO) Take by mouth.  Marland Kitchen albuterol (VENTOLIN HFA) 108 (90 Base) MCG/ACT inhaler Inhale 1-2 puffs into the lungs every 4 (four) hours as needed for wheezing or shortness of breath.  . Cholecalciferol (VITAMIN D3) 1.25 MG (50000 UT) CAPS Take 1 capsule by mouth 2 (two) times a week. Takes on Tuesday and Thursday  . Ibuprofen-diphenhydrAMINE Cit (ADVIL PM PO) Take by mouth.  . traMADol (ULTRAM) 50 MG tablet Take 1 tablet (50 mg total) by mouth every 6 (six) hours as needed.   No facility-administered encounter medications on file as of 04/23/2019.     Activities of Daily Living In your present state of health, do you have any difficulty performing the following activities: 04/23/2019 09/12/2018  Hearing? N N  Vision? N N  Difficulty concentrating or making decisions? N N  Walking or climbing stairs? N N  Dressing or bathing? N N  Doing errands, shopping? N N  Preparing Food and eating ? N -  Using the Toilet? N -  In the past six  months, have you accidently leaked urine? N -  Do you have problems with loss of bowel control? N -  Managing your Medications? N -  Managing your Finances? N -  Housekeeping or managing your Housekeeping? N -  Some recent data might be hidden    Patient Care Team: Glendale Chard, MD as PCP - [facility] [place], Eppie Gibson, RN as [facility]  Daneen Schick as Social Worker Little, Claudette Stapler, RN as Case Manager    Assessment:   This is a routine wellness examination for Brylan.  Exercise Activities and Dietary recommendations Current Exercise Habits: The patient does not participate in regular exercise at present  Goals    . Patient Stated     04/23/2019, no goals       Fall Risk Fall Risk  04/23/2019 09/11/2018 08/20/2018 07/24/2018 07/19/2017  Falls in the past year? 0 0 0 0 No  Comment - - - - Emmi Telephone Survey: data to providers prior to load  Follow up Falls evaluation completed;Education provided;Falls prevention discussed - - - -   Is the patient's home free of loose throw rugs in walkways, pet beds, electrical cords, etc?   yes      Grab bars in the bathroom? no      Handrails on the stairs?   yes      Adequate lighting?   yes  Timed Get Up and Go performed: n/a  Depression Screen PHQ 2/9 Scores 04/23/2019 12/03/2018 09/11/2018 08/20/2018  PHQ - 2 Score 0 0 0 0     Cognitive Function     6CIT Screen 04/23/2019  What Year? 0 points  What month? 0 points  What time? 0 points  Count back from 20 0 points  Months in reverse 0 points  Repeat phrase 0 points  Total Score 0    Immunization History  Administered Date(s) Administered  . Tdap 09/18/2010    Qualifies for Shingles Vaccine? yes  Screening Tests Health Maintenance  Topic Date Due  . PNA vac Low Risk Adult (1 of 2 - PCV13) 06/26/2015  . INFLUENZA VACCINE  03/15/2019  . MAMMOGRAM  03/29/2020  . TETANUS/TDAP  09/18/2020  . COLONOSCOPY  03/06/2027  . DEXA SCAN  Completed  .  Hepatitis C Screening  Completed    Cancer Screenings: Lung: Low Dose CT Chest recommended if Age 62-80 years, 30 pack-year currently smoking OR have quit w/in 15years. Patient does not qualify. Breast:  Up to date on Mammogram? No   Up to date of Bone Density/Dexa? Yes Colorectal: up to date  Additional Screenings: : Hepatitis C Screening: 12/2012     Plan:    Patient has no goals set at this time.   I have personally reviewed and noted the following in the patient's chart:   . Medical and social history . Use of alcohol, tobacco or illicit drugs  . Current medications and supplements . Functional ability and status . Nutritional status . Physical activity . Advanced directives . List of other physicians . Hospitalizations, surgeries, and ER visits in previous 12 months . Vitals . Screenings to include cognitive, depression, and falls . Referrals and appointments  In addition, I have reviewed and discussed with patient certain preventive protocols, quality metrics, and best practice recommendations. A written personalized care plan for preventive services as well as general preventive health recommendations were provided to patient.     Kellie Simmering, LPN  [phone]

## 2019-04-23 NOTE — Patient Instructions (Signed)
Julie Obrien , Thank you for taking time to come for your Medicare Wellness Visit. I appreciate your ongoing commitment to your health goals. Please review the following plan we discussed and let me know if I can assist you in the future.   Screening recommendations/referrals: Colonoscopy: 02/2017 Mammogram: 03/2018 Bone Density: 02/2017 Recommended yearly ophthalmology/optometry visit for glaucoma screening and checkup Recommended yearly dental visit for hygiene and checkup  Vaccinations: Influenza vaccine: decline Pneumococcal vaccine: sent to pharmacy Tdap vaccine: 09/2010 Shingles vaccine: discussed    Advanced directives: Advance directive discussed with you today. Even though you declined this today please call our office should you change your mind and we can give you the proper paperwork for you to fill out.   Conditions/risks identified: obesity  Next appointment: 04/29/2020 at 10:00   Preventive Care 69 Years and Older, Female Preventive care refers to lifestyle choices and visits with your health care provider that can promote health and wellness. What does preventive care include?  A yearly physical exam. This is also called an annual well check.  Dental exams once or twice a year.  Routine eye exams. Ask your health care provider how often you should have your eyes checked.  Personal lifestyle choices, including:  Daily care of your teeth and gums.  Regular physical activity.  Eating a healthy diet.  Avoiding tobacco and drug use.  Limiting alcohol use.  Practicing safe sex.  Taking low-dose aspirin every day.  Taking vitamin and mineral supplements as recommended by your health care provider. What happens during an annual well check? The services and screenings done by your health care provider during your annual well check will depend on your age, overall health, lifestyle risk factors, and family history of disease. Counseling  Your health care provider  may ask you questions about your:  Alcohol use.  Tobacco use.  Drug use.  Emotional well-being.  Home and relationship well-being.  Sexual activity.  Eating habits.  History of falls.  Memory and ability to understand (cognition).  Work and work Statistician.  Reproductive health. Screening  You may have the following tests or measurements:  Height, weight, and BMI.  Blood pressure.  Lipid and cholesterol levels. These may be checked every 5 years, or more frequently if you are over 51 years old.  Skin check.  Lung cancer screening. You may have this screening every year starting at age 53 if you have a 30-pack-year history of smoking and currently smoke or have quit within the past 15 years.  Fecal occult blood test (FOBT) of the stool. You may have this test every year starting at age 56.  Flexible sigmoidoscopy or colonoscopy. You may have a sigmoidoscopy every 5 years or a colonoscopy every 10 years starting at age 42.  Hepatitis C blood test.  Hepatitis B blood test.  Sexually transmitted disease (STD) testing.  Diabetes screening. This is done by checking your blood sugar (glucose) after you have not eaten for a while (fasting). You may have this done every 1-3 years.  Bone density scan. This is done to screen for osteoporosis. You may have this done starting at age 79.  Mammogram. This may be done every 1-2 years. Talk to your health care provider about how often you should have regular mammograms. Talk with your health care provider about your test results, treatment options, and if necessary, the need for more tests. Vaccines  Your health care provider may recommend certain vaccines, such as:  Influenza vaccine. This is  recommended every year.  Tetanus, diphtheria, and acellular pertussis (Tdap, Td) vaccine. You may need a Td booster every 10 years.  Zoster vaccine. You may need this after age 74.  Pneumococcal 13-valent conjugate (PCV13) vaccine.  One dose is recommended after age 75.  Pneumococcal polysaccharide (PPSV23) vaccine. One dose is recommended after age 29. Talk to your health care provider about which screenings and vaccines you need and how often you need them. This information is not intended to replace advice given to you by your health care provider. Make sure you discuss any questions you have with your health care provider. Document Released: 08/27/2015 Document Revised: 04/19/2016 Document Reviewed: 06/01/2015 Elsevier Interactive Patient Education  2017 [place] Prevention in the Home Falls can cause injuries. They can happen to people of all ages. There are many things you can do to make your home safe and to help prevent falls. What can I do on the outside of my home?  Regularly fix the edges of walkways and driveways and fix any cracks.  Remove anything that might make you trip as you walk through a door, such as a raised step or threshold.  Trim any bushes or trees on the path to your home.  Use bright outdoor lighting.  Clear any walking paths of anything that might make someone trip, such as rocks or tools.  Regularly check to see if handrails are loose or broken. Make sure that both sides of any steps have handrails.  Any raised decks and porches should have guardrails on the edges.  Have any leaves, snow, or ice cleared regularly.  Use sand or salt on walking paths during winter.  Clean up any spills in your garage right away. This includes oil or grease spills. What can I do in the bathroom?  Use night lights.  Install grab bars by the toilet and in the tub and shower. Do not use towel bars as grab bars.  Use non-skid mats or decals in the tub or shower.  If you need to sit down in the shower, use a plastic, non-slip stool.  Keep the floor dry. Clean up any water that spills on the floor as soon as it happens.  Remove soap buildup in the tub or shower regularly.  Attach bath  mats securely with double-sided non-slip rug tape.  Do not have throw rugs and other things on the floor that can make you trip. What can I do in the bedroom?  Use night lights.  Make sure that you have a light by your bed that is easy to reach.  Do not use any sheets or blankets that are too big for your bed. They should not hang down onto the floor.  Have a firm chair that has side arms. You can use this for support while you get dressed.  Do not have throw rugs and other things on the floor that can make you trip. What can I do in the kitchen?  Clean up any spills right away.  Avoid walking on wet floors.  Keep items that you use a lot in easy-to-reach places.  If you need to reach something above you, use a strong step stool that has a grab bar.  Keep electrical cords out of the way.  Do not use floor polish or wax that makes floors slippery. If you must use wax, use non-skid floor wax.  Do not have throw rugs and other things on the floor that can make you trip.  What can I do with my stairs?  Do not leave any items on the stairs.  Make sure that there are handrails on both sides of the stairs and use them. Fix handrails that are broken or loose. Make sure that handrails are as long as the stairways.  Check any carpeting to make sure that it is firmly attached to the stairs. Fix any carpet that is loose or worn.  Avoid having throw rugs at the top or bottom of the stairs. If you do have throw rugs, attach them to the floor with carpet tape.  Make sure that you have a light switch at the top of the stairs and the bottom of the stairs. If you do not have them, ask someone to add them for you. What else can I do to help prevent falls?  Wear shoes that:  Do not have high heels.  Have rubber bottoms.  Are comfortable and fit you well.  Are closed at the toe. Do not wear sandals.  If you use a stepladder:  Make sure that it is fully opened. Do not climb a closed  stepladder.  Make sure that both sides of the stepladder are locked into place.  Ask someone to hold it for you, if possible.  Clearly mark and make sure that you can see:  Any grab bars or handrails.  First and last steps.  Where the edge of each step is.  Use tools that help you move around (mobility aids) if they are needed. These include:  Canes.  Walkers.  Scooters.  Crutches.  Turn on the lights when you go into a dark area. Replace any light bulbs as soon as they burn out.  Set up your furniture so you have a clear path. Avoid moving your furniture around.  If any of your floors are uneven, fix them.  If there are any pets around you, be aware of where they are.  Review your medicines with your doctor. Some medicines can make you feel dizzy. This can increase your chance of falling. Ask your doctor what other things that you can do to help prevent falls. This information is not intended to replace advice given to you by your health care provider. Make sure you discuss any questions you have with your health care provider. Document Released: 05/27/2009 Document Revised: 01/06/2016 Document Reviewed: 09/04/2014 Elsevier Interactive Patient Education  2017 [employer].

## 2019-04-25 LAB — URIC ACID: Uric Acid: 5.2 mg/dL (ref 2.5–7.1)

## 2019-04-25 LAB — CMP14+EGFR
ALT: 7 IU/L (ref 0–32)
AST: 14 IU/L (ref 0–40)
Albumin/Globulin Ratio: 2.2 (ref 1.2–2.2)
Albumin: 4.6 g/dL (ref 3.8–4.8)
Alkaline Phosphatase: 92 IU/L (ref 39–117)
BUN/Creatinine Ratio: 17 (ref 12–28)
BUN: 14 mg/dL (ref 8–27)
Bilirubin Total: 0.2 mg/dL (ref 0.0–1.2)
CO2: 23 mmol/L (ref 20–29)
Calcium: 10.1 mg/dL (ref 8.7–10.3)
Chloride: 107 mmol/L — ABNORMAL HIGH (ref 96–106)
Creatinine, Ser: 0.83 mg/dL (ref 0.57–1.00)
GFR calc Af Amer: 84 mL/min/{1.73_m2} (ref >59)
GFR calc non Af Amer: 73 mL/min/{1.73_m2} (ref >59)
Globulin, Total: 2.1 g/dL (ref 1.5–4.5)
Glucose: 95 mg/dL (ref 65–99)
Potassium: 4.9 mmol/L (ref 3.5–5.2)
Sodium: 145 mmol/L — ABNORMAL HIGH (ref 134–144)
Total Protein: 6.7 g/dL (ref 6.0–8.5)

## 2019-04-25 LAB — ANTINUCLEAR ANTIBODIES, IFA: ANA Titer 1: NEGATIVE

## 2019-04-25 LAB — CBC
Hematocrit: 41.8 % (ref 34.0–46.6)
Hemoglobin: 13.4 g/dL (ref 11.1–15.9)
MCH: 26.9 pg (ref 26.6–33.0)
MCHC: 32.1 g/dL (ref 31.5–35.7)
MCV: 84 fL (ref 79–97)
Platelets: 361 10*3/uL (ref 150–450)
RBC: 4.99 x10E6/uL (ref 3.77–5.28)
RDW: 13.5 % (ref 11.7–15.4)
WBC: 9.3 10*3/uL (ref 3.4–10.8)

## 2019-04-25 LAB — RHEUMATOID FACTOR: Rheumatoid fact SerPl-aCnc: <10 IU/mL (ref 0.0–13.9)

## 2019-04-25 LAB — CYCLIC CITRUL PEPTIDE ANTIBODY, IGG/IGA: Cyclic Citrullin Peptide Ab: 8 units (ref 0–19)

## 2019-04-25 LAB — SEDIMENTATION RATE: Sed Rate: 11 mm/hr (ref 0–40)

## 2019-04-27 NOTE — Progress Notes (Signed)
Subjective:     Patient ID: Julie Obrien , female    DOB: 04/18/1951 , 68 y.o.   MRN: [id number]   Chief Complaint  Patient presents with  . Hand Pain    HPI  Hand Pain  The incident occurred more than 1 week ago. There was no injury mechanism. The pain is present in the left fingers and left hand. The quality of the pain is described as aching. The pain is at a severity of 5/10. The pain is moderate. The pain has been fluctuating since the incident. The symptoms are aggravated by movement. She has tried acetaminophen for the symptoms. The treatment provided no relief.     Past Medical History:  Diagnosis Date  . Arthritis    hnp, low back , knees - " all OA"  . Complication of anesthesia    nausea one surgery more recent  . History of kidney stones   . Pneumonia    Feb.- Mar. 2017, not hospitalized, but pending surgery was cancelled ,since then, pt. seen by PCP, told that it was cleared    . PONV (postoperative nausea and vomiting)      Family History  Problem Relation Age of Onset  . Dementia Mother   . Cancer Mother   . Cancer Sister   . Cancer Maternal Aunt      Current Outpatient Medications:  .  Acetaminophen (TYLENOL ARTHRITIS EXT RELIEF PO), Take by mouth., Disp: , Rfl:  .  albuterol (VENTOLIN HFA) 108 (90 Base) MCG/ACT inhaler, Inhale 1-2 puffs into the lungs every 4 (four) hours as needed for wheezing or shortness of breath., Disp: 1 Inhaler, Rfl: 3 .  Cholecalciferol (VITAMIN D3) 1.25 MG (50000 UT) CAPS, Take 1 capsule by mouth 2 (two) times a week. Takes on Tuesday and Thursday, Disp: , Rfl:  .  Ibuprofen-diphenhydrAMINE Cit (ADVIL PM PO), Take by mouth., Disp: , Rfl:  .  traMADol (ULTRAM) 50 MG tablet, Take 1 tablet (50 mg total) by mouth every 12 (twelve) hours as needed for severe pain., Disp: 30 tablet, Rfl: 0   Allergies  Allergen Reactions  . Augmentin [Amoxicillin-Pot Clavulanate] Nausea And Vomiting    "Tore up my stomach" (verified this with  her daughter, a physician)  . Sulfa Antibiotics Hives     Review of Systems  Constitutional: Negative.   Respiratory: Negative.   Cardiovascular: Negative.   Gastrointestinal: Negative.   Neurological: Negative.   Psychiatric/Behavioral: Negative.      Today's Vitals   04/23/19 1049  BP: 124/64  Pulse: 74  Temp: 98.5 F (36.9 C)  TempSrc: Oral  SpO2: 94%  Weight: 188 lb 12.8 oz (85.6 kg)  Height: 5' 1.6" (1.565 m)   Body mass index is 34.98 kg/m.   Objective:  Physical Exam Vitals signs and nursing note reviewed.  Constitutional:      Appearance: Normal appearance.  HENT:     Head: Normocephalic and atraumatic.  Cardiovascular:     Rate and Rhythm: Normal rate and regular rhythm.     Heart sounds: Normal heart sounds.  Pulmonary:     Effort: Pulmonary effort is normal.     Breath sounds: Normal breath sounds.  Musculoskeletal:     Comments: Trigger finger of left middle finger and left ring finger. There is some PIP joint swelling.   Skin:    General: Skin is warm.  Neurological:     General: No focal deficit present.     Mental Status: She is   alert.  Psychiatric:        Mood and Affect: Mood normal.        Behavior: Behavior normal.         Assessment And Plan:     1. Left hand pain  I will check autoimmune arthritis panel. Pt advised her sx are likely due to osteoarthritis.  She is encouraged to remove processed foods from her diet.   - ANA, IFA (with reflex) - CYCLIC CITRUL PEPTIDE ANTIBODY, IGG/IGA - Rheumatoid factor - Sedimentation rate - Uric acid  2. Trigger middle finger of left hand  I will refer her to hand specialist for further evaluation. She may benefit from steroid injection.   - Ambulatory referral to Hand Surgery  3. Trigger ring finger of left hand  Please see above.  - Ambulatory referral to Hand Surgery  4. Tobacco abuse counseling  Importance of smoking cessation was discussed with the patient for greater than 3  minutes.   5. Drug therapy  - CBC no Diff - CMP14+EGFR  6. Class 1 obesity due to excess calories without serious comorbidity with body mass index (BMI) of 34.0 to 34.9 in adult  Importance of achieving optimal weight to decrease risk of cardiovascular disease and cancers was discussed with the patient in full detail. Importance of regular exercise was discussed with the patient. She is encouraged to start slowly - start with 10 minutes twice daily at least three to four days per week and to gradually build to 30 minutes five days weekly. She was given tips to incorporate more activity into her daily routine - take stairs when possible, park farther away from her job, grocery stores, etc.    Robyn N Sanders, MD    THE PATIENT IS ENCOURAGED TO PRACTICE SOCIAL DISTANCING DUE TO THE COVID-19 PANDEMIC.   

## 2019-04-28 ENCOUNTER — Encounter: Payer: Self-pay | Admitting: Internal Medicine

## 2019-05-01 DIAGNOSIS — M79642 Pain in left hand: Secondary | ICD-10-CM | POA: Diagnosis not present

## 2019-05-06 DIAGNOSIS — M79642 Pain in left hand: Secondary | ICD-10-CM | POA: Diagnosis not present

## 2019-05-18 DIAGNOSIS — M549 Dorsalgia, unspecified: Secondary | ICD-10-CM | POA: Diagnosis not present

## 2019-05-18 DIAGNOSIS — M7071 Other bursitis of hip, right hip: Secondary | ICD-10-CM | POA: Diagnosis not present

## 2019-05-18 DIAGNOSIS — M542 Cervicalgia: Secondary | ICD-10-CM | POA: Diagnosis not present

## 2019-05-18 DIAGNOSIS — Z6832 Body mass index (BMI) 32.0-32.9, adult: Secondary | ICD-10-CM | POA: Diagnosis not present

## 2019-05-20 ENCOUNTER — Encounter: Payer: Self-pay | Admitting: Internal Medicine

## 2019-05-20 DIAGNOSIS — M47816 Spondylosis without myelopathy or radiculopathy, lumbar region: Secondary | ICD-10-CM | POA: Diagnosis not present

## 2019-05-20 DIAGNOSIS — M48061 Spinal stenosis, lumbar region without neurogenic claudication: Secondary | ICD-10-CM | POA: Diagnosis not present

## 2019-05-22 ENCOUNTER — Ambulatory Visit: Payer: Medicare Other | Admitting: Internal Medicine

## 2019-05-27 DIAGNOSIS — M461 Sacroiliitis, not elsewhere classified: Secondary | ICD-10-CM | POA: Diagnosis not present

## 2019-06-05 DIAGNOSIS — M542 Cervicalgia: Secondary | ICD-10-CM | POA: Diagnosis not present

## 2019-06-05 DIAGNOSIS — M47816 Spondylosis without myelopathy or radiculopathy, lumbar region: Secondary | ICD-10-CM | POA: Diagnosis not present

## 2019-06-05 DIAGNOSIS — M48062 Spinal stenosis, lumbar region with neurogenic claudication: Secondary | ICD-10-CM | POA: Diagnosis not present

## 2019-06-06 ENCOUNTER — Other Ambulatory Visit: Payer: Self-pay | Admitting: Neurosurgery

## 2019-06-06 DIAGNOSIS — M542 Cervicalgia: Secondary | ICD-10-CM

## 2019-06-06 DIAGNOSIS — M48062 Spinal stenosis, lumbar region with neurogenic claudication: Secondary | ICD-10-CM

## 2019-06-11 ENCOUNTER — Ambulatory Visit
Admission: RE | Admit: 2019-06-11 | Discharge: 2019-06-11 | Disposition: A | Discharge status: Home / Self Care | Payer: Medicare Other | Source: Ambulatory Visit | Attending: Neurosurgery | Admitting: Neurosurgery

## 2019-06-11 DIAGNOSIS — M542 Cervicalgia: Secondary | ICD-10-CM

## 2019-06-11 DIAGNOSIS — M48062 Spinal stenosis, lumbar region with neurogenic claudication: Secondary | ICD-10-CM

## 2019-06-11 DIAGNOSIS — M4322 Fusion of spine, cervical region: Secondary | ICD-10-CM | POA: Diagnosis not present

## 2019-06-11 DIAGNOSIS — M545 Low back pain: Secondary | ICD-10-CM | POA: Diagnosis not present

## 2019-06-17 ENCOUNTER — Other Ambulatory Visit: Payer: Medicare Other

## 2019-06-19 DIAGNOSIS — M542 Cervicalgia: Secondary | ICD-10-CM | POA: Diagnosis not present

## 2019-06-19 DIAGNOSIS — Z6832 Body mass index (BMI) 32.0-32.9, adult: Secondary | ICD-10-CM | POA: Diagnosis not present

## 2019-06-19 DIAGNOSIS — M47816 Spondylosis without myelopathy or radiculopathy, lumbar region: Secondary | ICD-10-CM | POA: Diagnosis not present

## 2019-06-19 DIAGNOSIS — R030 Elevated blood-pressure reading, without diagnosis of hypertension: Secondary | ICD-10-CM | POA: Diagnosis not present

## 2019-06-26 ENCOUNTER — Other Ambulatory Visit: Payer: Self-pay

## 2019-06-26 ENCOUNTER — Encounter: Payer: Self-pay | Admitting: Internal Medicine

## 2019-06-26 ENCOUNTER — Ambulatory Visit (INDEPENDENT_AMBULATORY_CARE_PROVIDER_SITE_OTHER): Payer: Medicare Other | Admitting: Internal Medicine

## 2019-06-26 VITALS — BP 110/60 | HR 79 | Temp 96.8°F | Ht 61.6 in | Wt 189.2 lb

## 2019-06-26 DIAGNOSIS — Z1231 Encounter for screening mammogram for malignant neoplasm of breast: Secondary | ICD-10-CM | POA: Diagnosis not present

## 2019-06-26 DIAGNOSIS — R29890 Loss of height: Secondary | ICD-10-CM | POA: Diagnosis not present

## 2019-06-26 DIAGNOSIS — M85852 Other specified disorders of bone density and structure, left thigh: Secondary | ICD-10-CM | POA: Diagnosis not present

## 2019-06-26 DIAGNOSIS — R2241 Localized swelling, mass and lump, right lower limb: Secondary | ICD-10-CM | POA: Diagnosis not present

## 2019-06-26 DIAGNOSIS — Z803 Family history of malignant neoplasm of breast: Secondary | ICD-10-CM | POA: Diagnosis not present

## 2019-06-26 DIAGNOSIS — Z780 Asymptomatic menopausal state: Secondary | ICD-10-CM | POA: Diagnosis not present

## 2019-06-26 LAB — HM DEXA SCAN: HM Dexa Scan: POSITIVE

## 2019-06-26 LAB — HM MAMMOGRAPHY

## 2019-06-26 NOTE — Progress Notes (Signed)
Subjective:     Patient ID: Julie Obrien , female    DOB: 1951-03-17 , 68 y.o.   MRN: [id number]   Chief Complaint  Patient presents with  . Recurrent Skin Infections    HPI Pt states she developed a quarter size lump on her R lateral him one month ego and has gradually been getting elongated towards her lateral thigh. She thought initially was an abscess, but never got red, fluctuant or drained. She denies pain with it, and only feels a little soreness if she presses on it. Denies new paresthesia related to this. Is currently seeing a specialist for lumbar epidural injections.    Past Medical History:  Diagnosis Date  . Arthritis    hnp, low back , knees - " all OA"  . Complication of anesthesia    nausea one surgery more recent  . History of kidney stones   . Pneumonia    Feb.- Mar. 2017, not hospitalized, but pending surgery was cancelled ,since then, pt. seen by PCP, told that it was cleared    . PONV (postoperative nausea and vomiting)      Family History  Problem Relation Age of Onset  . Dementia Mother   . Cancer Mother   . Cancer Sister   . Cancer Maternal Aunt      Current Outpatient Medications:  .  Acetaminophen (TYLENOL ARTHRITIS EXT RELIEF PO), Take by mouth., Disp: , Rfl:  .  albuterol (VENTOLIN HFA) 108 (90 Base) MCG/ACT inhaler, Inhale 1-2 puffs into the lungs every 4 (four) hours as needed for wheezing or shortness of breath., Disp: 1 Inhaler, Rfl: 3 .  Cholecalciferol (VITAMIN D3) 1.25 MG (50000 UT) CAPS, Take 1 capsule by mouth 2 (two) times a week. Takes on Tuesday and Thursday, Disp: , Rfl:  .  Ibuprofen-diphenhydrAMINE Cit (ADVIL PM PO), Take by mouth., Disp: , Rfl:  .  traMADol (ULTRAM) 50 MG tablet, Take 1 tablet (50 mg total) by mouth every 12 (twelve) hours as needed for severe pain., Disp: 30 tablet, Rfl: 0   Allergies  Allergen Reactions  . Augmentin [Amoxicillin-Pot Clavulanate] Nausea And Vomiting    "Tore up my stomach" (verified this  with her daughter, a physician)  . Sulfa Antibiotics Hives     Review of Systems  + chronic neck pain and stiffness and going to PT for this, chronic lumbar radiculopathy being followed up by specialist. The rest of 10 point ROS is negative Today's Vitals   06/26/19 1420  BP: 110/60  Pulse: 79  Temp: (!) 96.8 F (36 C)  TempSrc: Oral  Weight: 189 lb 3.2 oz (85.8 kg)  Height: 5' 1.6" (1.565 m)   Body mass index is 35.06 kg/m.   Objective:  Physical Exam Vitals signs and nursing note reviewed.  Constitutional:      General: She is not in acute distress.    Appearance: She is obese. She is not toxic-appearing.  HENT:     Right Ear: External ear normal.     Left Ear: External ear normal.  Eyes:     General: No scleral icterus.    Conjunctiva/sclera: Conjunctivae normal.  Neck:     Musculoskeletal: Neck supple.  Pulmonary:     Effort: Pulmonary effort is normal.  Skin:    General: Skin is warm and dry.     Findings: No bruising, erythema, lesion or rash.     Comments: R THIGH- has 2 x 5 cm soft mass on lateral R proximal  thigh which is mildly tender.   Neurological:     Mental Status: She is alert and oriented to person, place, and time.     Gait: Gait normal.  Psychiatric:        Mood and Affect: Mood normal.        Behavior: Behavior normal.        Thought Content: Thought content normal.        Judgment: Judgment normal.     Assessment And Plan:   1. Lump of right thigh- acute - [place] RT LOWER EXTREM LTD SOFT TISSUE NON VASCULAR; Future  We will inform her of the results when back.    Soren Lazarz RODRIGUEZ-SOUTHWORTH, PA-C    THE PATIENT IS ENCOURAGED TO PRACTICE SOCIAL DISTANCING DUE TO THE COVID-19 PANDEMIC.

## 2019-06-30 ENCOUNTER — Encounter: Payer: Self-pay | Admitting: Internal Medicine

## 2019-06-30 ENCOUNTER — Ambulatory Visit
Admission: RE | Admit: 2019-06-30 | Discharge: 2019-06-30 | Disposition: A | Discharge status: Home / Self Care | Payer: Medicare Other | Source: Ambulatory Visit | Attending: Internal Medicine | Admitting: Internal Medicine

## 2019-06-30 DIAGNOSIS — R2241 Localized swelling, mass and lump, right lower limb: Secondary | ICD-10-CM | POA: Diagnosis not present

## 2019-06-30 DIAGNOSIS — M25511 Pain in right shoulder: Secondary | ICD-10-CM | POA: Diagnosis not present

## 2019-06-30 DIAGNOSIS — M542 Cervicalgia: Secondary | ICD-10-CM | POA: Diagnosis not present

## 2019-07-01 ENCOUNTER — Encounter: Payer: Self-pay | Admitting: Internal Medicine

## 2019-07-01 ENCOUNTER — Other Ambulatory Visit: Payer: Self-pay | Admitting: Internal Medicine

## 2019-07-01 ENCOUNTER — Telehealth: Payer: Self-pay

## 2019-07-01 DIAGNOSIS — M48062 Spinal stenosis, lumbar region with neurogenic claudication: Secondary | ICD-10-CM | POA: Diagnosis not present

## 2019-07-01 DIAGNOSIS — R2241 Localized swelling, mass and lump, right lower limb: Secondary | ICD-10-CM

## 2019-07-01 NOTE — Telephone Encounter (Signed)
Pt LVM just called to let you know she had ultra sound done at [facility] 06/30/2019

## 2019-07-02 ENCOUNTER — Other Ambulatory Visit: Payer: Medicare Other

## 2019-07-02 ENCOUNTER — Encounter: Payer: Self-pay | Admitting: Internal Medicine

## 2019-07-04 ENCOUNTER — Ambulatory Visit: Payer: Medicare Other | Admitting: Physical Therapy

## 2019-07-07 ENCOUNTER — Encounter: Payer: Self-pay | Admitting: Internal Medicine

## 2019-07-08 DIAGNOSIS — M545 Low back pain: Secondary | ICD-10-CM | POA: Diagnosis not present

## 2019-07-08 DIAGNOSIS — M25511 Pain in right shoulder: Secondary | ICD-10-CM | POA: Diagnosis not present

## 2019-07-08 DIAGNOSIS — M542 Cervicalgia: Secondary | ICD-10-CM | POA: Diagnosis not present

## 2019-07-15 ENCOUNTER — Ambulatory Visit
Admission: RE | Admit: 2019-07-15 | Discharge: 2019-07-15 | Disposition: A | Discharge status: Home / Self Care | Payer: Medicare Other | Source: Ambulatory Visit | Attending: Internal Medicine | Admitting: Internal Medicine

## 2019-07-15 DIAGNOSIS — R2241 Localized swelling, mass and lump, right lower limb: Secondary | ICD-10-CM

## 2019-07-16 ENCOUNTER — Encounter: Payer: Self-pay | Admitting: Internal Medicine

## 2019-07-16 DIAGNOSIS — D1720 Benign lipomatous neoplasm of skin and subcutaneous tissue of unspecified limb: Secondary | ICD-10-CM

## 2019-07-16 DIAGNOSIS — M542 Cervicalgia: Secondary | ICD-10-CM | POA: Diagnosis not present

## 2019-07-16 DIAGNOSIS — M25511 Pain in right shoulder: Secondary | ICD-10-CM | POA: Diagnosis not present

## 2019-07-16 DIAGNOSIS — M545 Low back pain: Secondary | ICD-10-CM | POA: Diagnosis not present

## 2019-07-17 ENCOUNTER — Encounter: Payer: Self-pay | Admitting: Internal Medicine

## 2019-07-18 ENCOUNTER — Encounter: Payer: Self-pay | Admitting: Internal Medicine

## 2019-07-20 ENCOUNTER — Encounter: Payer: Self-pay | Admitting: Internal Medicine

## 2019-07-21 DIAGNOSIS — M25511 Pain in right shoulder: Secondary | ICD-10-CM | POA: Diagnosis not present

## 2019-07-21 DIAGNOSIS — M545 Low back pain: Secondary | ICD-10-CM | POA: Diagnosis not present

## 2019-07-21 DIAGNOSIS — M542 Cervicalgia: Secondary | ICD-10-CM | POA: Diagnosis not present

## 2019-09-04 DIAGNOSIS — M48062 Spinal stenosis, lumbar region with neurogenic claudication: Secondary | ICD-10-CM | POA: Diagnosis not present

## 2019-09-04 DIAGNOSIS — Z6832 Body mass index (BMI) 32.0-32.9, adult: Secondary | ICD-10-CM | POA: Diagnosis not present

## 2019-09-04 DIAGNOSIS — R030 Elevated blood-pressure reading, without diagnosis of hypertension: Secondary | ICD-10-CM | POA: Diagnosis not present

## 2019-09-11 ENCOUNTER — Other Ambulatory Visit: Payer: Self-pay

## 2019-09-11 ENCOUNTER — Emergency Department (HOSPITAL_COMMUNITY)
Admission: EM | Admit: 2019-09-11 | Discharge: 2019-09-11 | Disposition: A | Discharge status: Home / Self Care | Payer: Medicare Other | Attending: Emergency Medicine | Admitting: Emergency Medicine

## 2019-09-11 ENCOUNTER — Encounter (HOSPITAL_COMMUNITY): Payer: Self-pay

## 2019-09-11 DIAGNOSIS — R400 Somnolence: Secondary | ICD-10-CM | POA: Insufficient documentation

## 2019-09-11 DIAGNOSIS — M1990 Unspecified osteoarthritis, unspecified site: Secondary | ICD-10-CM | POA: Diagnosis not present

## 2019-09-11 DIAGNOSIS — F17210 Nicotine dependence, cigarettes, uncomplicated: Secondary | ICD-10-CM | POA: Diagnosis not present

## 2019-09-11 DIAGNOSIS — J449 Chronic obstructive pulmonary disease, unspecified: Secondary | ICD-10-CM | POA: Diagnosis not present

## 2019-09-11 DIAGNOSIS — R9431 Abnormal electrocardiogram [ECG] [EKG]: Secondary | ICD-10-CM | POA: Diagnosis not present

## 2019-09-11 DIAGNOSIS — T481X1A Poisoning by skeletal muscle relaxants [neuromuscular blocking agents], accidental (unintentional), initial encounter: Secondary | ICD-10-CM | POA: Diagnosis not present

## 2019-09-11 DIAGNOSIS — T481X5A Adverse effect of skeletal muscle relaxants [neuromuscular blocking agents], initial encounter: Secondary | ICD-10-CM | POA: Insufficient documentation

## 2019-09-11 DIAGNOSIS — M6281 Muscle weakness (generalized): Secondary | ICD-10-CM | POA: Diagnosis not present

## 2019-09-11 DIAGNOSIS — J45909 Unspecified asthma, uncomplicated: Secondary | ICD-10-CM | POA: Insufficient documentation

## 2019-09-11 DIAGNOSIS — T428X1A Poisoning by antiparkinsonism drugs and other central muscle-tone depressants, accidental (unintentional), initial encounter: Secondary | ICD-10-CM

## 2019-09-11 LAB — CBC WITH DIFFERENTIAL/PLATELET
Abs Immature Granulocytes: 0.02 10*3/uL (ref 0.00–0.07)
Basophils Absolute: 0 10*3/uL (ref 0.0–0.1)
Basophils Relative: 0 %
Eosinophils Absolute: 0.1 10*3/uL (ref 0.0–0.5)
Eosinophils Relative: 1 %
HCT: 46.2 % — ABNORMAL HIGH (ref 36.0–46.0)
Hemoglobin: 14.7 g/dL (ref 12.0–15.0)
Immature Granulocytes: 0 %
Lymphocytes Relative: 32 %
Lymphs Abs: 2.8 10*3/uL (ref 0.7–4.0)
MCH: 26.6 pg (ref 26.0–34.0)
MCHC: 31.8 g/dL (ref 30.0–36.0)
MCV: 83.7 fL (ref 80.0–100.0)
Monocytes Absolute: 0.4 10*3/uL (ref 0.1–1.0)
Monocytes Relative: 4 %
Neutro Abs: 5.4 10*3/uL (ref 1.7–7.7)
Neutrophils Relative %: 63 %
Platelets: 396 10*3/uL (ref 150–400)
RBC: 5.52 MIL/uL — ABNORMAL HIGH (ref 3.87–5.11)
RDW: 15 % (ref 11.5–15.5)
WBC: 8.8 10*3/uL (ref 4.0–10.5)
nRBC: 0 % (ref 0.0–0.2)

## 2019-09-11 LAB — BASIC METABOLIC PANEL
Anion gap: 14 (ref 5–15)
BUN: 12 mg/dL (ref 8–23)
CO2: 20 mmol/L — ABNORMAL LOW (ref 22–32)
Calcium: 9.5 mg/dL (ref 8.9–10.3)
Chloride: 108 mmol/L (ref 98–111)
Creatinine, Ser: 0.72 mg/dL (ref 0.44–1.00)
GFR calc Af Amer: >60 mL/min (ref >60)
GFR calc non Af Amer: >60 mL/min (ref >60)
Glucose, Bld: 116 mg/dL — ABNORMAL HIGH (ref 70–99)
Potassium: 4.2 mmol/L (ref 3.5–5.1)
Sodium: 142 mmol/L (ref 135–145)

## 2019-09-11 LAB — URINALYSIS, ROUTINE W REFLEX MICROSCOPIC
Bilirubin Urine: NEGATIVE
Glucose, UA: NEGATIVE mg/dL
Hgb urine dipstick: NEGATIVE
Ketones, ur: 5 mg/dL — AB
Leukocytes,Ua: NEGATIVE
Nitrite: NEGATIVE
Protein, ur: NEGATIVE mg/dL
Specific Gravity, Urine: 1.01 (ref 1.005–1.030)
pH: 8 (ref 5.0–8.0)

## 2019-09-11 NOTE — ED Notes (Signed)
Pt ambulated to restroom with steady gait to collect urine sample 

## 2019-09-11 NOTE — ED Notes (Signed)
Pt requesting home tramadol, d/t increased sedation from tramadol, MD Sabra Heck advises hold home meds.

## 2019-09-11 NOTE — ED Provider Notes (Signed)
I received pt in signout from Dr. Sabra Heck.  Briefly, she had presented with generalized weakness and somnolence after taking multiple doses of a family members baclofen.  Suspicion that symptoms were related to baclofen use.  At time of signout, awaiting completion of lab work and improvement in mental status.  Lab work unremarkable including normal UA.  On reassessment, patient was alert, fluent speech, oriented, answering questions appropriately.  She has ambulated for nursing without problems.  I have strongly counseled the patient on not taking any medications that are not prescribed to her and using extreme caution with any sedating medications that are prescribed to her such as tramadol.  Return precautions reviewed.   Julie Obrien, Julie Overland, MD 09/11/19 [phone]

## 2019-09-11 NOTE — ED Notes (Signed)
Patient verbalizes understanding of discharge instructions. Opportunity for questioning and answers were provided. Armband removed by staff, pt discharged from ED ambulatory w/ son 

## 2019-09-11 NOTE — Discharge Instructions (Addendum)
Please do not take medicine that is not prescribed to you - the Baclofen is causing you to be sedated and fatigued.  Drink plenty of fluids - return to the ER for any severe or worsening symptoms including increased weakness, shortness of breath, chest pain, or fevers - see your doctor in 2-3 days for recheck.

## 2019-09-11 NOTE — ED Provider Notes (Signed)
[facility] Provider Note   CSN: [id number] Arrival date & time: 09/11/19  1310     History No chief complaint on file.   Julie Obrien is a 69 y.o. female.  HPI   This patient is a very pleasant 69 year old female, she has a history of some low back pain with a herniated disc which gives her some occasional pain in the past, it seems to radiate into the hip.  Because of having increasing hip pain and knee pain overnight the patient had taken some increasing doses of pain medicine and muscle relaxer which she got from a family member.  The patient evidently has 2 daughters which are physicians in the state of [place], one of them was a medical physician and the patient had called her and was told that it was okay for her to take baclofen from another family member.  This is according to the patient's report.  The patient took a couple of doses overnight and then another one this morning, she had had some progressive weakness and somnolence coordinating with this.  She had had some increasing nausea and was given a dose of Zofran by paramedics who found the patient to be mildly somnolent.  The patient decided to stay at her son's house last night to visit with grandchildren.  This is where she had obtained the medicines.  The patient denies coughing shortness of breath fevers, dysuria, diarrhea, swelling, rashes and has no headache blurred vision or any other symptoms.  She still feels weak and very tired.  The paramedics noted normal vital signs including a normal heart rate.  There was a normal cardiac rhythm on their monitoring.  Past Medical History:  Diagnosis Date  . Arthritis    hnp, low back , knees - " all OA"  . Complication of anesthesia    nausea one surgery more recent  . History of kidney stones   . Pneumonia    Feb.- Mar. 2017, not hospitalized, but pending surgery was cancelled ,since then, pt. seen by PCP, told that it was cleared     . PONV (postoperative nausea and vomiting)     Patient Active Problem List   Diagnosis Date Noted  . Adult BMI 32.0-32.9 kg/sq m 10/13/2018  . Hypoxia 09/15/2018  . Tobacco abuse 09/15/2018  . CAP (community acquired pneumonia) 09/15/2018  . Influenza A 09/12/2018  . COPD exacerbation ([facility]) 02/11/2018  . Asthma with acute exacerbation 02/10/2018  . Spinal stenosis of lumbar region 01/05/2016  . Thoracic spine fracture, closed, initial encounter 06/21/2015  . 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 BACK SURGERY  08  . CERVICAL DISCECTOMY  09  . CESAREAN SECTION     x4  . CYST EXCISION     vocal cords?  . CYSTO  99   stone  . HARDWARE REMOVAL N/A 06/21/2015   Procedure: Exploration of Fusion and removal of hardware right thoracic level one;  Surgeon: Kary Kos, MD;  Location: [facility];  Service: Neurosurgery;  Laterality: N/A;  . POSTERIOR CERVICAL FUSION/FORAMINOTOMY N/A 05/31/2015   Procedure: Decompression and Laminectomy Cervical three-four  Cervical six-seven  Cervical seven-thoracic-one Posterior cervical fusion with lateral mass fixation;  Surgeon: Kary Kos, MD;  Location: [facility];  Service: Neurosurgery;  Laterality: N/A;  . TONSILLECTOMY    . TUBAL LIGATION       OB History   No obstetric history on  file.     Family History  Problem Relation Age of Onset  . Dementia Mother   . Cancer Mother   . Cancer Sister   . Cancer Maternal Aunt     Social History   Tobacco Use  . Smoking status: Current Every Day Smoker    Packs/day: 0.75    Years: 32.00    Pack years: 24.00    Types: Cigarettes  . Smokeless tobacco: Current User  . Tobacco comment: "I know I have to tell myself this is what I want to do but I am not ready to do that yet"  Substance Use Topics  . Alcohol use: No  . Drug use: No    Home Medications Prior to Admission medications   Medication Sig Start Date End Date  Taking? Authorizing Provider  albuterol (VENTOLIN HFA) 108 (90 Base) MCG/ACT inhaler Inhale 1-2 puffs into the lungs every 4 (four) hours as needed for wheezing or shortness of breath. 02/13/18  Yes Danford, Suann Larry, MD  calcium citrate-vitamin D (CITRACAL+D) 315-200 MG-UNIT tablet Take 1 tablet by mouth 2 (two) times daily.   Yes [provider]  Cholecalciferol (VITAMIN D3) 1.25 MG (50000 UT) CAPS Take 1 capsule by mouth 2 (two) times a week. Takes on Tuesday and Thursday   Yes [provider]  cyclobenzaprine (FLEXERIL) 10 MG tablet Take 10 mg by mouth 3 (three) times daily as needed. 05/22/19  Yes [provider]  Ibuprofen-diphenhydrAMINE Cit (ADVIL PM PO) Take by mouth.   Yes [provider]  naproxen sodium (ALEVE) 220 MG tablet Take 220 mg by mouth daily as needed.   Yes [provider]  traMADol (ULTRAM) 50 MG tablet Take 1 tablet (50 mg total) by mouth every 12 (twelve) hours as needed for severe pain. 04/23/19 04/22/20 Yes Glendale Chard, MD    Allergies    Augmentin [amoxicillin-pot clavulanate] and Sulfa antibiotics  Review of Systems   Review of Systems  All other systems reviewed and are negative.   Physical Exam Updated Vital Signs BP (!) 153/83   Pulse 69   Resp 17   SpO2 96%   Physical Exam Vitals and nursing note reviewed.  Constitutional:      General: She is not in acute distress.    Appearance: She is well-developed.  HENT:     Head: Normocephalic and atraumatic.     Mouth/Throat:     Pharynx: No oropharyngeal exudate.  Eyes:     General: No scleral icterus.       Right eye: No discharge.        Left eye: No discharge.     Conjunctiva/sclera: Conjunctivae normal.     Pupils: Pupils are equal, round, and reactive to light.  Neck:     Thyroid: No thyromegaly.     Vascular: No JVD.  Cardiovascular:     Rate and Rhythm: Normal rate and regular rhythm.     Heart sounds: Normal heart sounds. No murmur. No  friction rub. No gallop.   Pulmonary:     Effort: Pulmonary effort is normal. No respiratory distress.     Breath sounds: Normal breath sounds. No wheezing or rales.  Abdominal:     General: Bowel sounds are normal. There is no distension.     Palpations: Abdomen is soft. There is no mass.     Tenderness: There is no abdominal tenderness.  Musculoskeletal:        General: No tenderness. Normal range of motion.  Cervical back: Normal range of motion and neck supple.  Lymphadenopathy:     Cervical: No cervical adenopathy.  Skin:    General: Skin is warm and dry.     Findings: No erythema or rash.  Neurological:     Mental Status: She is alert.     Coordination: Coordination normal.     Comments: The patient is tired to somnolent appearing, very easily responds to voice and is able to carry on a full conversation in a very lucid manner, follows commands without any difficulty including lifting all 4 extremities, normal grips, normal facial symmetry normal speech  Psychiatric:        Behavior: Behavior normal.     Comments: The patient is not depressed suicidal or hallucinating, she appears very calm     ED Results / Procedures / Treatments   Labs (all labs ordered are listed, but only abnormal results are displayed) Labs Reviewed  CBC WITH DIFFERENTIAL/PLATELET - Abnormal; Notable for the following components:      Result Value   RBC 5.52 (*)    HCT 46.2 (*)    All other components within normal limits  BASIC METABOLIC PANEL - Abnormal; Notable for the following components:   CO2 20 (*)    Glucose, Bld 116 (*)    All other components within normal limits  URINALYSIS, ROUTINE W REFLEX MICROSCOPIC    EKG EKG Interpretation  Date/Time:  Thursday September 11 2019 14:19:57 EST Ventricular Rate:  74 PR Interval:    QRS Duration: 78 QT Interval:  406 QTC Calculation: 451 R Axis:   -53 Text Interpretation: Sinus rhythm Abnormal R-wave progression, late transition Inferior  infarct, old Baseline wander in lead(s) V3 Since last tracing rate slower Confirmed by Noemi Chapel [id number]) on 09/11/2019 3:04:39 PM   Radiology No results found.  Procedures Procedures (including critical care time)  Medications Ordered in ED Medications - No data to display  ED Course  I have reviewed the triage vital signs and the nursing notes.  Pertinent labs & imaging results that were available during my care of the patient were reviewed by me and considered in my medical decision making (see chart for details).    MDM Rules/Calculators/A&P                      This patient had presented with some decreased level of consciousness albeit mild.  It is unclear exactly why her son's girlfriend called for paramedics however it appears that the patient was increasingly somnolent after taking baclofen in the last 12 hours which does not belong to her.  She was taking this because of pain in the hip, she does not have any focal findings on exam of concern including any signs of stroke, she has no other symptoms to suggest infection sepsis, infection, bleeding or surgical causes of her symptoms.  I will obtain some basic labs EKG and keep her on a cardiac monitor awaiting for the baclofen to wear off.  The patient is agreeable to this plan.  Labs so far show no leukocytosis, no anemia, normal metabolic panel except for a glucose of 116 which is essentially of no consequence.  Urinalysis pending, at change of shift care signed out to Dr. Rex Kras to follow-up results and disposition once the patient has awakened from her relative sedation from baclofen.  Final Clinical Impression(s) / ED Diagnoses Final diagnoses:  None    Rx / DC Orders ED Discharge Orders  None       Noemi Chapel, MD 09/11/19 1550

## 2019-09-24 DIAGNOSIS — G5602 Carpal tunnel syndrome, left upper limb: Secondary | ICD-10-CM | POA: Diagnosis not present

## 2019-09-24 DIAGNOSIS — M65342 Trigger finger, left ring finger: Secondary | ICD-10-CM | POA: Diagnosis not present

## 2019-09-24 DIAGNOSIS — M65332 Trigger finger, left middle finger: Secondary | ICD-10-CM | POA: Diagnosis not present

## 2019-09-25 DIAGNOSIS — H2513 Age-related nuclear cataract, bilateral: Secondary | ICD-10-CM | POA: Diagnosis not present

## 2019-10-11 DIAGNOSIS — Z23 Encounter for immunization: Secondary | ICD-10-CM | POA: Diagnosis not present

## 2019-10-21 ENCOUNTER — Encounter: Payer: Self-pay | Admitting: Internal Medicine

## 2019-10-21 ENCOUNTER — Other Ambulatory Visit: Payer: Self-pay

## 2019-10-21 ENCOUNTER — Ambulatory Visit (INDEPENDENT_AMBULATORY_CARE_PROVIDER_SITE_OTHER): Payer: Medicare Other | Admitting: Internal Medicine

## 2019-10-21 VITALS — BP 126/80 | HR 79 | Temp 98.3°F | Ht 61.6 in | Wt 183.0 lb

## 2019-10-21 DIAGNOSIS — J441 Chronic obstructive pulmonary disease with (acute) exacerbation: Secondary | ICD-10-CM | POA: Diagnosis not present

## 2019-10-21 DIAGNOSIS — Z6833 Body mass index (BMI) 33.0-33.9, adult: Secondary | ICD-10-CM

## 2019-10-21 DIAGNOSIS — M65332 Trigger finger, left middle finger: Secondary | ICD-10-CM | POA: Diagnosis not present

## 2019-10-21 DIAGNOSIS — E6609 Other obesity due to excess calories: Secondary | ICD-10-CM | POA: Diagnosis not present

## 2019-10-21 DIAGNOSIS — Z716 Tobacco abuse counseling: Secondary | ICD-10-CM

## 2019-10-21 NOTE — Patient Instructions (Addendum)
Prevnar-13  Please have [facility] fax over last 2 years low dose CT chest results.   Thank you!

## 2019-11-04 DIAGNOSIS — M47816 Spondylosis without myelopathy or radiculopathy, lumbar region: Secondary | ICD-10-CM | POA: Diagnosis not present

## 2019-11-04 DIAGNOSIS — R030 Elevated blood-pressure reading, without diagnosis of hypertension: Secondary | ICD-10-CM | POA: Diagnosis not present

## 2019-11-04 DIAGNOSIS — Z6831 Body mass index (BMI) 31.0-31.9, adult: Secondary | ICD-10-CM | POA: Diagnosis not present

## 2019-11-04 DIAGNOSIS — M48062 Spinal stenosis, lumbar region with neurogenic claudication: Secondary | ICD-10-CM | POA: Diagnosis not present

## 2019-11-12 ENCOUNTER — Telehealth: Payer: Self-pay

## 2019-11-12 ENCOUNTER — Encounter: Payer: Self-pay | Admitting: Internal Medicine

## 2019-11-12 NOTE — Telephone Encounter (Signed)
Called pt to schedule appt for pt to be seen for insomnia. Offered pt an appt but pt refused and stated that she would call back

## 2019-11-12 NOTE — Progress Notes (Signed)
This visit occurred during the SARS-CoV-2 public health emergency.  Safety protocols were in place, including screening questions prior to the visit, additional usage of staff PPE, and extensive cleaning of exam room while observing appropriate contact time as indicated for disinfecting solutions.  Subjective:     Patient ID: Julie Obrien , female    DOB: 1950/12/22 , 69 y.o.   MRN: [id number]   Chief Complaint  Patient presents with  . trigger finger    f/u    HPI  She is here today for further evaluation of trigger finger. States her sx having been going on for months. Since she made this appt, she has since seen Dr. Fredna Dow. She does not wish to have surgery if possible. She sometimes has pain with her finger gets "stuck". At this time, she is having issues with left middle finger.     Past Medical History:  Diagnosis Date  . Arthritis    hnp, low back , knees - " all OA"  . Complication of anesthesia    nausea one surgery more recent  . History of kidney stones   . Pneumonia    Feb.- Mar. 2017, not hospitalized, but pending surgery was cancelled ,since then, pt. seen by PCP, told that it was cleared    . PONV (postoperative nausea and vomiting)      Family History  Problem Relation Age of Onset  . Dementia Mother   . Cancer Mother   . Cancer Sister   . Cancer Maternal Aunt      Current Outpatient Medications:  .  albuterol (VENTOLIN HFA) 108 (90 Base) MCG/ACT inhaler, Inhale 1-2 puffs into the lungs every 4 (four) hours as needed for wheezing or shortness of breath., Disp: 1 Inhaler, Rfl: 3 .  calcium citrate-vitamin D (CITRACAL+D) 315-200 MG-UNIT tablet, Take 1 tablet by mouth 2 (two) times daily., Disp: , Rfl:  .  Cholecalciferol (VITAMIN D3) 1.25 MG (50000 UT) CAPS, Take 1 capsule by mouth 2 (two) times a week. Takes on Tuesday and Thursday, Disp: , Rfl:  .  cyclobenzaprine (FLEXERIL) 10 MG tablet, Take 10 mg by mouth 3 (three) times daily as needed., Disp: , Rfl:   .  traMADol (ULTRAM) 50 MG tablet, Take 1 tablet (50 mg total) by mouth every 12 (twelve) hours as needed for severe pain., Disp: 30 tablet, Rfl: 0   Allergies  Allergen Reactions  . Augmentin [Amoxicillin-Pot Clavulanate] Nausea And Vomiting    "Tore up my stomach" (verified this with her daughter, a physician)  . Sulfa Antibiotics Hives     Review of Systems  Constitutional: Negative.   Respiratory: Negative.   Cardiovascular: Negative.   Gastrointestinal: Negative.   Neurological: Negative.   Psychiatric/Behavioral: Negative.      Today's Vitals   10/21/19 1137  BP: 126/80  Pulse: 79  Temp: 98.3 F (36.8 C)  TempSrc: Oral  Weight: 183 lb (83 kg)  Height: 5' 1.6" (1.565 m)   Body mass index is 33.91 kg/m.   Objective:  Physical Exam Vitals and nursing note reviewed.  Constitutional:      Appearance: Normal appearance. She is obese.  HENT:     Head: Normocephalic and atraumatic.  Cardiovascular:     Rate and Rhythm: Normal rate and regular rhythm.     Heart sounds: Normal heart sounds.  Pulmonary:     Effort: Pulmonary effort is normal.     Breath sounds: Normal breath sounds.  Skin:    General: Skin  is warm.  Neurological:     General: No focal deficit present.     Mental Status: She is alert.  Psychiatric:        Mood and Affect: Mood normal.        Behavior: Behavior normal.         Assessment And Plan:     1. Trigger middle finger of left hand  She is advised to apply Voltaren gel to PIP/DIPjoints of left middle finger. She will continue with Dr. Levell July care as well.   2. Encounter for tobacco use cessation counseling  She was encouraged to cut back number of cigs smoked per day.  I planned to schedule her for CT chest low dose scan. However, she adds that her daughter has scheduled her for this in [place], [place]. She has test yearly. Pt encouraged to have records sent to me.   3. Chronic obstructive pulmonary disease with (acute) exacerbation  (HCC)  Chronic, yet stable. Importance of smoking cessation was discussed with the patient.   4. Class 1 obesity due to excess calories with serious comorbidity and body mass index (BMI) of 33.0 to 33.9 in adult  She is encouraged to strive for BMI less than 30 to decrease cardiac risk. Importance of regular exercise was discussed with the patient. Advised to aim for at least 30 minutes of exercise five days per week.   Maximino Greenland, MD    THE PATIENT IS ENCOURAGED TO PRACTICE SOCIAL DISTANCING DUE TO THE COVID-19 PANDEMIC.

## 2019-12-16 DIAGNOSIS — G5602 Carpal tunnel syndrome, left upper limb: Secondary | ICD-10-CM | POA: Diagnosis not present

## 2019-12-16 DIAGNOSIS — M65332 Trigger finger, left middle finger: Secondary | ICD-10-CM | POA: Diagnosis not present

## 2019-12-16 DIAGNOSIS — M65342 Trigger finger, left ring finger: Secondary | ICD-10-CM | POA: Diagnosis not present

## 2019-12-16 DIAGNOSIS — M65321 Trigger finger, right index finger: Secondary | ICD-10-CM | POA: Diagnosis not present

## 2020-01-20 ENCOUNTER — Encounter: Payer: Self-pay | Admitting: Internal Medicine

## 2020-01-20 ENCOUNTER — Other Ambulatory Visit: Payer: Self-pay

## 2020-01-20 ENCOUNTER — Ambulatory Visit (INDEPENDENT_AMBULATORY_CARE_PROVIDER_SITE_OTHER): Payer: Medicare Other | Admitting: Internal Medicine

## 2020-01-20 VITALS — BP 114/66 | HR 84 | Temp 98.6°F | Ht 61.6 in | Wt 183.6 lb

## 2020-01-20 DIAGNOSIS — R6883 Chills (without fever): Secondary | ICD-10-CM

## 2020-01-20 DIAGNOSIS — E559 Vitamin D deficiency, unspecified: Secondary | ICD-10-CM

## 2020-01-20 DIAGNOSIS — Z79899 Other long term (current) drug therapy: Secondary | ICD-10-CM

## 2020-01-20 DIAGNOSIS — E6609 Other obesity due to excess calories: Secondary | ICD-10-CM

## 2020-01-20 DIAGNOSIS — R141 Gas pain: Secondary | ICD-10-CM | POA: Diagnosis not present

## 2020-01-20 DIAGNOSIS — R142 Eructation: Secondary | ICD-10-CM

## 2020-01-20 DIAGNOSIS — Z6834 Body mass index (BMI) 34.0-34.9, adult: Secondary | ICD-10-CM

## 2020-01-20 DIAGNOSIS — R143 Flatulence: Secondary | ICD-10-CM

## 2020-01-20 DIAGNOSIS — R0789 Other chest pain: Secondary | ICD-10-CM | POA: Diagnosis not present

## 2020-01-20 LAB — POCT URINALYSIS DIPSTICK
Bilirubin, UA: NEGATIVE
Blood, UA: NEGATIVE
Glucose, UA: NEGATIVE
Ketones, UA: NEGATIVE
Leukocytes, UA: NEGATIVE
Nitrite, UA: NEGATIVE
Protein, UA: NEGATIVE
Spec Grav, UA: <=1.005 — AB (ref 1.010–1.025)
Urobilinogen, UA: 0.2 E.U./dL
pH, UA: 5.5 (ref 5.0–8.0)

## 2020-01-20 NOTE — Progress Notes (Signed)
This visit occurred during the SARS-CoV-2 public health emergency.  Safety protocols were in place, including screening questions prior to the visit, additional usage of staff PPE, and extensive cleaning of exam room while observing appropriate contact time as indicated for disinfecting solutions.  Subjective:     Patient ID: Julie Obrien , female    DOB: 01-Jan-1951 , 69 y.o.   MRN: [id number]   Chief Complaint  Patient presents with  . Staying cold  . GI Problem    stomach tightness    HPI  She presents today for f/u chills/fatigue. She reports that she was watching TV with her son when she suddenly had chills. This was last night about 630pm. She went to put on her pajamas, but still felt cold. She denies urinary sx.  She is a smoker, and does report having a cough. It is non-productive. No ill contacts.    Past Medical History:  Diagnosis Date  . Arthritis    hnp, low back , knees - " all OA"  . Complication of anesthesia    nausea one surgery more recent  . History of kidney stones   . Pneumonia    Feb.- Mar. 2017, not hospitalized, but pending surgery was cancelled ,since then, pt. seen by PCP, told that it was cleared    . PONV (postoperative nausea and vomiting)      Family History  Problem Relation Age of Onset  . Dementia Mother   . Cancer Mother   . Cancer Sister   . Cancer Maternal Aunt      Current Outpatient Medications:  .  Acetaminophen (TYLENOL ARTHRITIS EXT RELIEF PO), Take by mouth. 2 tablets daily, Disp: , Rfl:  .  albuterol (VENTOLIN HFA) 108 (90 Base) MCG/ACT inhaler, Inhale 1-2 puffs into the lungs every 4 (four) hours as needed for wheezing or shortness of breath., Disp: 1 Inhaler, Rfl: 3 .  calcium citrate-vitamin D (CITRACAL+D) 315-200 MG-UNIT tablet, Take 1 tablet by mouth 2 (two) times daily., Disp: , Rfl:  .  traMADol (ULTRAM) 50 MG tablet, Take 1 tablet (50 mg total) by mouth every 12 (twelve) hours as needed for severe pain., Disp: 30  tablet, Rfl: 0 .  Cholecalciferol (VITAMIN D3) 1.25 MG (50000 UT) CAPS, Take 1 capsule by mouth 2 (two) times a week. Takes on Tuesday and Thursday, Disp: , Rfl:    Allergies  Allergen Reactions  . Augmentin [Amoxicillin-Pot Clavulanate] Nausea And Vomiting    "Tore up my stomach" (verified this with her daughter, a physician)  . Sulfa Antibiotics Hives     Review of Systems  Constitutional: Positive for chills and fatigue.  Respiratory: Negative.   Cardiovascular: Positive for chest pain.       She c/o mid-chest pain. No associated sob/cp. Unable to determine triggers. Occurs at rest.   Gastrointestinal: Positive for abdominal distention.       She also reports worsening gas and abdominal bloating. She is not sure what may have triggered her sx.   Neurological: Negative.   Psychiatric/Behavioral: Negative.      Today's Vitals   01/20/20 0835  BP: 114/66  Pulse: 84  Temp: 98.6 F (37 C)  TempSrc: Oral  Weight: 183 lb 9.6 oz (83.3 kg)  Height: 5' 1.6" (1.565 m)  PainSc: 5   PainLoc: Back   Body mass index is 34.02 kg/m.   Objective:  Physical Exam Vitals and nursing note reviewed.  Constitutional:      Appearance: Normal appearance.  HENT:     Head: Normocephalic and atraumatic.  Cardiovascular:     Rate and Rhythm: Normal rate and regular rhythm.     Heart sounds: Normal heart sounds.  Pulmonary:     Effort: Pulmonary effort is normal.     Breath sounds: Normal breath sounds.  Abdominal:     General: Bowel sounds are normal. There is distension.     Palpations: Abdomen is soft.     Tenderness: There is no abdominal tenderness. There is no guarding.  Skin:    General: Skin is warm.  Neurological:     General: No focal deficit present.     Mental Status: She is alert.     Cranial Nerves: Cranial nerve deficit:   Psychiatric:        Mood and Affect: Mood normal.        Behavior: Behavior normal.         Assessment And Plan:     1. Chills without  fever  I will check labs as listed below. I will make further recommendations once her labs are available for review.   - POCT Urinalysis Dipstick (81002) - CBC with Diff  2. Flatulence, eructation and gas pain  She was given samples of Dexilant to take once daily. I will check labs as listed below. She will f/u in four weeks for re-evaluation.   - CMP14+EGFR  3. Atypical chest pain  EKG performed, NSR w/o acute changes. I think her sx are GI-related. She will let me know if her sx persist despite meds as listed above.   - EKG 12-Lead  4. Vitamin D deficiency disease  I WILL CHECK A VIT D LEVEL AND SUPPLEMENT AS NEEDED.  ALSO ENCOURAGED TO SPEND 15 MINUTES IN THE SUN DAILY.  - Vitamin D (25 hydroxy)  5. Class 1 obesity due to excess calories without serious comorbidity with body mass index (BMI) of 34.0 to 34.9 in adult  She is encouraged to strive for BMI less than 30 to decrease cardiac risk. She is encouraged to incorporate more exercise into her daily routine.   6. Drug therapy  - Vitamin B12   Maximino Greenland, MD    THE PATIENT IS ENCOURAGED TO PRACTICE SOCIAL DISTANCING DUE TO THE COVID-19 PANDEMIC.

## 2020-01-20 NOTE — Patient Instructions (Signed)

## 2020-01-21 ENCOUNTER — Other Ambulatory Visit: Payer: Self-pay | Admitting: Internal Medicine

## 2020-01-21 ENCOUNTER — Encounter: Payer: Self-pay | Admitting: Internal Medicine

## 2020-01-21 LAB — CMP14+EGFR
ALT: 13 IU/L (ref 0–32)
AST: 19 IU/L (ref 0–40)
Albumin/Globulin Ratio: 2 (ref 1.2–2.2)
Albumin: 4.3 g/dL (ref 3.8–4.8)
Alkaline Phosphatase: 105 IU/L (ref 48–121)
BUN/Creatinine Ratio: 14 (ref 12–28)
BUN: 11 mg/dL (ref 8–27)
Bilirubin Total: 0.2 mg/dL (ref 0.0–1.2)
CO2: 23 mmol/L (ref 20–29)
Calcium: 9.6 mg/dL (ref 8.7–10.3)
Chloride: 103 mmol/L (ref 96–106)
Creatinine, Ser: 0.78 mg/dL (ref 0.57–1.00)
GFR calc Af Amer: 90 mL/min/{1.73_m2} (ref >59)
GFR calc non Af Amer: 78 mL/min/{1.73_m2} (ref >59)
Globulin, Total: 2.2 g/dL (ref 1.5–4.5)
Glucose: 94 mg/dL (ref 65–99)
Potassium: 5 mmol/L (ref 3.5–5.2)
Sodium: 139 mmol/L (ref 134–144)
Total Protein: 6.5 g/dL (ref 6.0–8.5)

## 2020-01-21 LAB — TSH: TSH: 2.28 u[IU]/mL (ref 0.450–4.500)

## 2020-01-21 LAB — CBC WITH DIFFERENTIAL/PLATELET
Basophils Absolute: 0 10*3/uL (ref 0.0–0.2)
Basos: 0 %
EOS (ABSOLUTE): 0.1 10*3/uL (ref 0.0–0.4)
Eos: 1 %
Hematocrit: 36.9 % (ref 34.0–46.6)
Hemoglobin: 12.4 g/dL (ref 11.1–15.9)
Immature Grans (Abs): 0 10*3/uL (ref 0.0–0.1)
Immature Granulocytes: 0 %
Lymphocytes Absolute: 3 10*3/uL (ref 0.7–3.1)
Lymphs: 23 %
MCH: 27.1 pg (ref 26.6–33.0)
MCHC: 33.6 g/dL (ref 31.5–35.7)
MCV: 81 fL (ref 79–97)
Monocytes Absolute: 0.7 10*3/uL (ref 0.1–0.9)
Monocytes: 5 %
Neutrophils Absolute: 9 10*3/uL — ABNORMAL HIGH (ref 1.4–7.0)
Neutrophils: 71 %
Platelets: 313 10*3/uL (ref 150–450)
RBC: 4.58 x10E6/uL (ref 3.77–5.28)
RDW: 13.1 % (ref 11.7–15.4)
WBC: 12.9 10*3/uL — ABNORMAL HIGH (ref 3.4–10.8)

## 2020-01-21 LAB — VITAMIN B12: Vitamin B-12: 552 pg/mL (ref 232–1245)

## 2020-01-21 LAB — VITAMIN D 25 HYDROXY (VIT D DEFICIENCY, FRACTURES): Vit D, 25-Hydroxy: 22.8 ng/mL — ABNORMAL LOW (ref 30.0–100.0)

## 2020-01-21 MED ORDER — VITAMIN D (ERGOCALCIFEROL) 1.25 MG (50000 UNIT) PO CAPS: 50000.0000 [IU] | ORAL_CAPSULE | ORAL | 0 refills | Status: DC

## 2020-01-22 ENCOUNTER — Other Ambulatory Visit: Payer: Self-pay | Admitting: Internal Medicine

## 2020-01-22 MED ORDER — AZITHROMYCIN 250 MG PO TABS: ORAL_TABLET | ORAL | 0 refills | Status: AC

## 2020-02-26 ENCOUNTER — Ambulatory Visit (INDEPENDENT_AMBULATORY_CARE_PROVIDER_SITE_OTHER): Payer: Medicare Other | Admitting: Internal Medicine

## 2020-02-26 ENCOUNTER — Other Ambulatory Visit: Payer: Self-pay

## 2020-02-26 ENCOUNTER — Encounter: Payer: Self-pay | Admitting: Internal Medicine

## 2020-02-26 VITALS — BP 114/78 | HR 74 | Temp 98.5°F | Ht 61.6 in | Wt 181.0 lb

## 2020-02-26 DIAGNOSIS — K219 Gastro-esophageal reflux disease without esophagitis: Secondary | ICD-10-CM

## 2020-02-26 DIAGNOSIS — Z23 Encounter for immunization: Secondary | ICD-10-CM

## 2020-02-26 DIAGNOSIS — E6609 Other obesity due to excess calories: Secondary | ICD-10-CM

## 2020-02-26 DIAGNOSIS — F5101 Primary insomnia: Secondary | ICD-10-CM

## 2020-02-26 DIAGNOSIS — Z6833 Body mass index (BMI) 33.0-33.9, adult: Secondary | ICD-10-CM

## 2020-02-26 MED ORDER — DEXILANT 60 MG PO CPDR: 60.0000 mg | DELAYED_RELEASE_CAPSULE | Freq: Every day | ORAL | 3 refills | Status: DC

## 2020-02-26 MED ORDER — PNEUMOCOCCAL 13-VAL CONJ VACC IM SUSP: 0.5000 mL | INTRAMUSCULAR | 0 refills | Status: AC

## 2020-02-26 NOTE — Patient Instructions (Signed)
Heartburn Heartburn is a type of pain or discomfort that can happen in the throat or chest. It is often described as a burning pain. It may also cause a bad, acid-like taste in the mouth. Heartburn may feel worse when you lie down or bend over. It may be worse at night. It may be caused by stomach contents that move back up (reflux) into the tube that connects the mouth with the stomach (esophagus). Follow these instructions at home: Eating and drinking   Avoid certain foods and drinks as told by your doctor. This may include: ? Coffee and tea (with or without caffeine). ? Drinks that have alcohol. ? Energy drinks and sports drinks. ? Carbonated drinks or sodas. ? Chocolate and cocoa. ? Peppermint and mint flavorings. ? Garlic and onions. ? Horseradish. ? Spicy and acidic foods, such as:  Peppers.  Chili powder and curry powder.  Vinegar.  Hot sauces and BBQ sauce. ? Citrus fruit juices and citrus fruits, such as:  Oranges.  Lemons.  Limes. ? Tomato-based foods, such as:  Red sauce and pizza with red sauce.  Chili.  Salsa. ? Fried and fatty foods, such as:  Donuts.  French fries and potato chips.  High-fat dressings. ? High-fat meats, such as:  Hot dogs and sausage.  Rib eye steak.  Ham and bacon. ? High-fat dairy items, such as:  Whole milk.  Butter.  Cream cheese.  Eat small meals often. Avoid eating large meals.  Avoid drinking large amounts of liquid with your meals.  Avoid eating meals during the 2-3 hours before bedtime.  Avoid lying down right after you eat.  Do not exercise right after you eat. Lifestyle      If you are overweight, lose an amount of weight that is healthy for you. Ask your doctor about a safe weight loss goal.  Do not use any products that contain nicotine or tobacco, including cigarettes, e-cigarettes, and chewing tobacco. These can make your symptoms worse. If you need help quitting, ask your doctor.  Wear loose  clothes. Do not wear anything tight around your waist.  Raise (elevate) the head of your bed about 6 inches (15 cm) when you sleep.  Try to lower your stress. If you need help doing this, ask your doctor. General instructions  Pay attention to any changes in your symptoms.  Take over-the-counter and prescription medicines only as told by your doctor. ? Do not take aspirin, ibuprofen, or other NSAIDs unless your doctor says it is okay. ? Stop medicines only as told by your doctor.  Keep all follow-up visits as told by your doctor. This is important. Contact a doctor if:  You have new symptoms.  You lose weight and you do not know why it is happening.  You have trouble swallowing, or it hurts to swallow.  You have wheezing or a cough that keeps happening.  Your symptoms do not get better with treatment.  You have heartburn often for more than 2 weeks. Get help right away if:  You have pain in your arms, neck, jaw, teeth, or back.  You feel sweaty, dizzy, or light-headed.  You have chest pain or shortness of breath.  You throw up (vomit) and your throw up looks like blood or coffee grounds.  Your poop (stool) is bloody or black. These symptoms may represent a serious problem that is an emergency. Do not wait to see if the symptoms will go away. Get medical help right away. Call your local   emergency services (911 in the [place] Do not drive yourself to the hospital. Summary  Heartburn is a type of pain that can happen in the throat or chest. It can feel like a burning pain. It may also cause a bad, acid-like taste in the mouth.  You may need to avoid certain foods and drinks to help your symptoms. Ask your doctor what foods and drinks you should avoid.  Take over-the-counter and prescription medicines only as told by your doctor. Do not take aspirin, ibuprofen, or other NSAIDs unless your doctor told you to do so.  Contact your doctor if your symptoms do not get better or  they get worse. This information is not intended to replace advice given to you by your health care provider. Make sure you discuss any questions you have with your health care provider. Document Revised: 12/31/2017 Document Reviewed: 12/31/2017 Elsevier Patient Education  [place].

## 2020-02-26 NOTE — Progress Notes (Signed)
I,Katawbba Wiggins,acting as a Education administrator for Maximino Greenland, MD.,have documented all relevant documentation on the behalf of Maximino Greenland, MD,as directed by  Maximino Greenland, MD while in the presence of Maximino Greenland, MD. This visit occurred during the SARS-CoV-2 public health emergency.  Safety protocols were in place, including screening questions prior to the visit, additional usage of staff PPE, and extensive cleaning of exam room while observing appropriate contact time as indicated for disinfecting solutions.  Subjective:     Patient ID: Julie Obrien , female    DOB: [birth year] , 69 y.o.   MRN: [id number]   Chief Complaint  Patient presents with  . Gas    Dexilant f/u    HPI  She is here today for f/u GERD. She was started on Dexilant at her last visit. She has done well since her last visit. She has not had any issues/side effects from the medication.     Past Medical History:  Diagnosis Date  . Arthritis    hnp, low back , knees - " all OA"  . Complication of anesthesia    nausea one surgery more recent  . History of kidney stones   . Pneumonia    Feb.- Mar. 2017, not hospitalized, but pending surgery was cancelled ,since then, pt. seen by PCP, told that it was cleared    . PONV (postoperative nausea and vomiting)      Family History  Problem Relation Age of Onset  . Dementia Mother   . Cancer Mother   . Cancer Sister   . Cancer Maternal Aunt      Current Outpatient Medications:  .  Acetaminophen (TYLENOL ARTHRITIS EXT RELIEF PO), Take by mouth. 2 tablets daily, Disp: , Rfl:  .  albuterol (VENTOLIN HFA) 108 (90 Base) MCG/ACT inhaler, Inhale 1-2 puffs into the lungs every 4 (four) hours as needed for wheezing or shortness of breath., Disp: 1 Inhaler, Rfl: 3 .  calcium citrate-vitamin D (CITRACAL+D) 315-200 MG-UNIT tablet, Take 1 tablet by mouth 2 (two) times daily., Disp: , Rfl:  .  Cholecalciferol (VITAMIN D3) 1.25 MG (50000 UT) CAPS, Take 1 capsule by  mouth 2 (two) times a week. Takes on Tuesday and Thursday, Disp: , Rfl:  .  traMADol (ULTRAM) 50 MG tablet, Take 1 tablet (50 mg total) by mouth every 12 (twelve) hours as needed for severe pain., Disp: 30 tablet, Rfl: 0 .  Vitamin D, Ergocalciferol, (DRISDOL) 1.25 MG (50000 UNIT) CAPS capsule, Take 1 capsule (50,000 Units total) by mouth every 7 (seven) days., Disp: 24 capsule, Rfl: 0   Allergies  Allergen Reactions  . Augmentin [Amoxicillin-Pot Clavulanate] Nausea And Vomiting    "Tore up my stomach" (verified this with her daughter, a physician)  . Sulfa Antibiotics Hives     Review of Systems  Constitutional: Negative.   Respiratory: Negative.   Cardiovascular: Negative.   Gastrointestinal: Negative.   Psychiatric/Behavioral: Positive for sleep disturbance.       She c/o insomnia. Her sx appear to be worsening. States her mind does not cut off.   All other systems reviewed and are negative.    Today's Vitals   02/26/20 1045  BP: 114/78  Pulse: 74  Temp: 98.5 F (36.9 C)  TempSrc: Oral  Weight: 181 lb (82.1 kg)  Height: 5' 1.6" (1.565 m)  PainSc: 6   PainLoc: Back   Body mass index is 33.54 kg/m.  Wt Readings from Last 3 Encounters:  02/26/20 181 lb (  82.1 kg)  01/20/20 183 lb 9.6 oz (83.3 kg)  10/21/19 183 lb (83 kg)   Objective:  Physical Exam Vitals and nursing note reviewed.  Constitutional:      Appearance: Normal appearance. She is obese.  HENT:     Head: Normocephalic and atraumatic.  Cardiovascular:     Rate and Rhythm: Normal rate and regular rhythm.     Heart sounds: Normal heart sounds.  Pulmonary:     Breath sounds: Normal breath sounds.  Skin:    General: Skin is warm.  Neurological:     General: No focal deficit present.     Mental Status: She is alert and oriented to person, place, and time.         Assessment And Plan:   1. Gastroesophageal reflux disease without esophagitis  Improved with use of Dexilant. I plan to treat her for 90  days. Reminded to stop eating 3 hours before going to bed. She was given another 4 weeks of samples. She will call back when more are needed. She is aware of risks associated with long-term use of PPIs.   2. Primary insomnia  Chronic. We discussed importance of having a bedtime routine. She has failed OTC meds. She was given samples of Belsomra 10mg  x 3 days, and then 15mg  to use if needed. She is instructed to contact me next week to let me know if this is working for her.   3. Class 1 obesity due to excess calories with serious comorbidity and body mass index (BMI) of 33.0 to 33.9 in adult  She is encouraged to strive for BMI less than 30 to decrease cardiac risk. Advised to aim for at least 150 minutes of exercise per week.   4. Immunization due  Rx Prevnar-13 was sent to the pharmacy.     Patient was given opportunity to ask questions. Patient verbalized understanding of the plan and was able to repeat key elements of the plan. All questions were answered to their satisfaction.  Michelle Nasuti, [place], Michelle Nasuti, CMA, have reviewed all documentation for this visit. The documentation on 02/26/20 for the exam, diagnosis, procedures, and orders are all accurate and complete.  THE PATIENT IS ENCOURAGED TO PRACTICE SOCIAL DISTANCING DUE TO THE COVID-19 PANDEMIC.

## 2020-03-07 ENCOUNTER — Encounter: Payer: Self-pay | Admitting: Internal Medicine

## 2020-03-31 ENCOUNTER — Encounter: Payer: Self-pay | Admitting: Internal Medicine

## 2020-04-29 ENCOUNTER — Ambulatory Visit: Payer: Medicare Other

## 2020-04-29 ENCOUNTER — Ambulatory Visit: Payer: Medicare Other | Admitting: Internal Medicine

## 2020-05-05 ENCOUNTER — Other Ambulatory Visit: Payer: Self-pay | Admitting: Internal Medicine

## 2020-06-09 ENCOUNTER — Telehealth: Payer: Self-pay

## 2020-06-09 ENCOUNTER — Emergency Department (HOSPITAL_COMMUNITY)
Admission: EM | Admit: 2020-06-09 | Discharge: 2020-06-09 | Disposition: A | Discharge status: Home / Self Care | Payer: Medicare Other | Attending: Emergency Medicine | Admitting: Emergency Medicine

## 2020-06-09 ENCOUNTER — Emergency Department (HOSPITAL_COMMUNITY): Payer: Medicare Other

## 2020-06-09 ENCOUNTER — Ambulatory Visit (INDEPENDENT_AMBULATORY_CARE_PROVIDER_SITE_OTHER)
Admission: EM | Admit: 2020-06-09 | Discharge: 2020-06-09 | Disposition: A | Discharge status: Home / Self Care | Payer: Medicare Other | Source: Home / Self Care

## 2020-06-09 ENCOUNTER — Other Ambulatory Visit: Payer: Self-pay

## 2020-06-09 ENCOUNTER — Encounter (HOSPITAL_COMMUNITY): Payer: Self-pay

## 2020-06-09 DIAGNOSIS — R059 Cough, unspecified: Secondary | ICD-10-CM | POA: Diagnosis not present

## 2020-06-09 DIAGNOSIS — R9431 Abnormal electrocardiogram [ECG] [EKG]: Secondary | ICD-10-CM | POA: Diagnosis not present

## 2020-06-09 DIAGNOSIS — J441 Chronic obstructive pulmonary disease with (acute) exacerbation: Secondary | ICD-10-CM | POA: Insufficient documentation

## 2020-06-09 DIAGNOSIS — R0902 Hypoxemia: Secondary | ICD-10-CM | POA: Insufficient documentation

## 2020-06-09 DIAGNOSIS — Z20822 Contact with and (suspected) exposure to covid-19: Secondary | ICD-10-CM | POA: Insufficient documentation

## 2020-06-09 DIAGNOSIS — R0602 Shortness of breath: Secondary | ICD-10-CM | POA: Insufficient documentation

## 2020-06-09 DIAGNOSIS — F17210 Nicotine dependence, cigarettes, uncomplicated: Secondary | ICD-10-CM | POA: Insufficient documentation

## 2020-06-09 LAB — COMPREHENSIVE METABOLIC PANEL
ALT: 12 U/L (ref 0–44)
AST: 19 U/L (ref 15–41)
Albumin: 3.9 g/dL (ref 3.5–5.0)
Alkaline Phosphatase: 92 U/L (ref 38–126)
Anion gap: 11 (ref 5–15)
BUN: 8 mg/dL (ref 8–23)
CO2: 26 mmol/L (ref 22–32)
Calcium: 9.3 mg/dL (ref 8.9–10.3)
Chloride: 103 mmol/L (ref 98–111)
Creatinine, Ser: 0.68 mg/dL (ref 0.44–1.00)
GFR, Estimated: >60 mL/min (ref >60)
Glucose, Bld: 99 mg/dL (ref 70–99)
Potassium: 3.8 mmol/L (ref 3.5–5.1)
Sodium: 140 mmol/L (ref 135–145)
Total Bilirubin: 0.7 mg/dL (ref 0.3–1.2)
Total Protein: 6.7 g/dL (ref 6.5–8.1)

## 2020-06-09 LAB — POCT RAPID STREP A (OFFICE): Rapid Strep A Screen: NEGATIVE

## 2020-06-09 LAB — CBC WITH DIFFERENTIAL/PLATELET
Abs Immature Granulocytes: 0.02 10*3/uL (ref 0.00–0.07)
Basophils Absolute: 0 10*3/uL (ref 0.0–0.1)
Basophils Relative: 1 %
Eosinophils Absolute: 0.3 10*3/uL (ref 0.0–0.5)
Eosinophils Relative: 3 %
HCT: 42.2 % (ref 36.0–46.0)
Hemoglobin: 13.1 g/dL (ref 12.0–15.0)
Immature Granulocytes: 0 %
Lymphocytes Relative: 25 %
Lymphs Abs: 2.1 10*3/uL (ref 0.7–4.0)
MCH: 26.6 pg (ref 26.0–34.0)
MCHC: 31 g/dL (ref 30.0–36.0)
MCV: 85.6 fL (ref 80.0–100.0)
Monocytes Absolute: 0.6 10*3/uL (ref 0.1–1.0)
Monocytes Relative: 7 %
Neutro Abs: 5.4 10*3/uL (ref 1.7–7.7)
Neutrophils Relative %: 64 %
Platelets: 353 10*3/uL (ref 150–400)
RBC: 4.93 MIL/uL (ref 3.87–5.11)
RDW: 15.7 % — ABNORMAL HIGH (ref 11.5–15.5)
WBC: 8.4 10*3/uL (ref 4.0–10.5)
nRBC: 0 % (ref 0.0–0.2)

## 2020-06-09 LAB — RESPIRATORY PANEL BY RT PCR (FLU A&B, COVID)
Influenza A by PCR: NEGATIVE
Influenza B by PCR: NEGATIVE
SARS Coronavirus 2 by RT PCR: NEGATIVE

## 2020-06-09 LAB — POC SARS CORONAVIRUS 2 AG -  ED: SARS Coronavirus 2 Ag: NEGATIVE

## 2020-06-09 MED ORDER — BENZONATATE 100 MG PO CAPS: 100.0000 mg | ORAL_CAPSULE | Freq: Three times a day (TID) | ORAL | 0 refills | Status: DC | PRN

## 2020-06-09 MED ORDER — AZITHROMYCIN 250 MG PO TABS: ORAL_TABLET | ORAL | 0 refills | Status: DC

## 2020-06-09 MED ORDER — PREDNISONE 50 MG PO TABS: 50.0000 mg | ORAL_TABLET | Freq: Every day | ORAL | 0 refills | Status: DC

## 2020-06-09 MED ORDER — ALBUTEROL SULFATE HFA 108 (90 BASE) MCG/ACT IN AERS
6.0000 | INHALATION_SPRAY | Freq: Once | RESPIRATORY_TRACT | Status: AC
  Administered 2020-06-09: 6 via RESPIRATORY_TRACT
  Filled 2020-06-09: qty 6.7

## 2020-06-09 MED ORDER — METHYLPREDNISOLONE SODIUM SUCC 125 MG IJ SOLR
80.0000 mg | Freq: Once | INTRAMUSCULAR | Status: AC
  Administered 2020-06-09: 80 mg via INTRAVENOUS
  Filled 2020-06-09: qty 2

## 2020-06-09 NOTE — ED Provider Notes (Signed)
[facility] EMERGENCY DEPARTMENT Provider Note   CSN: [id number] Arrival date & time: 06/09/20  1148     History Chief Complaint  Patient presents with  . Cough  . Sore Throat    Julie Obrien is a 69 y.o. female with PMH of 20-pack-year smoking history and COPD who presents the ED with a 2-day history of congestion, sore throat, headache, and cough productive of clearish yellow sputum.  Patient also states that she has been feeling particularly wheezy and endorses diminished appetite and an episode of loose stool.  Patient is fully immunized for COVID-19.  She was initially evaluated at an urgent care where she was found to be 88% on RA and subsequently referred here to the ED for evaluation.  While her SPO2 was improved in triage, during my examination she was noted to be varying between 88 to 92% on RA with mild tachypnea.  She is in no acute distress.  However, given her mildly increased work of breathing, placed her on 2 L supplemental O2 via [place].  She has never previously required oxygen supplementation.  However, she does not have wheezing in the setting of acute bronchitis.  She states that she has an albuterol inhaler at home, but noticed that it was expired since 2020 and did not use it.  Evidently she spent the weekend with her 14-year-old granddaughter who was also battling upper respiratory symptoms.  HPI     Past Medical History:  Diagnosis Date  . Arthritis    hnp, low back , knees - " all OA"  . Complication of anesthesia    nausea one surgery more recent  . History of kidney stones   . Pneumonia    Feb.- Mar. 2017, not hospitalized, but pending surgery was cancelled ,since then, pt. seen by PCP, told that it was cleared    . PONV (postoperative nausea and vomiting)     Patient Active Problem List   Diagnosis Date Noted  . Adult BMI 32.0-32.9 kg/sq m 10/13/2018  . Hypoxia 09/15/2018  . Tobacco abuse 09/15/2018  . CAP (community acquired  pneumonia) 09/15/2018  . Influenza A 09/12/2018  . COPD exacerbation ([place]) 02/11/2018  . Asthma with acute exacerbation 02/10/2018  . Spinal stenosis of lumbar region 01/05/2016  . Thoracic spine fracture, closed, initial encounter 06/21/2015  . 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 BACK SURGERY  08  . CERVICAL DISCECTOMY  09  . CESAREAN SECTION     x4  . CYST EXCISION     vocal cords?  . CYSTO  99   stone  . HARDWARE REMOVAL N/A 06/21/2015   Procedure: Exploration of Fusion and removal of hardware right thoracic level one;  Surgeon: Kary Kos, MD;  Location: [facility];  Service: Neurosurgery;  Laterality: N/A;  . POSTERIOR CERVICAL FUSION/FORAMINOTOMY N/A 05/31/2015   Procedure: Decompression and Laminectomy Cervical three-four  Cervical six-seven  Cervical seven-thoracic-one Posterior cervical fusion with lateral mass fixation;  Surgeon: Kary Kos, MD;  Location: [facility];  Service: Neurosurgery;  Laterality: N/A;  . TONSILLECTOMY    . TUBAL LIGATION       OB History   No obstetric history on file.     Family History  Problem Relation Age of Onset  . Dementia Mother   . Cancer Mother   . Cancer Sister   . Cancer Maternal Aunt     Social History   Tobacco  Use  . Smoking status: Current Every Day Smoker    Packs/day: 0.50    Years: 32.00    Pack years: 16.00    Types: Cigarettes  . Smokeless tobacco: Current User  . Tobacco comment: "I know I have to tell myself this is what I want to do but I am not ready to do that yet"  Vaping Use  . Vaping Use: Never used  Substance Use Topics  . Alcohol use: No  . Drug use: No    Home Medications Prior to Admission medications   Medication Sig Start Date End Date Taking? Authorizing Provider  acetaminophen (TYLENOL) 650 MG CR tablet Take 1,950 mg by mouth daily.   Yes [provider]  albuterol (VENTOLIN HFA) 108 (90 Base) MCG/ACT inhaler  Inhale 1-2 puffs into the lungs every 4 (four) hours as needed for wheezing or shortness of breath. 02/13/18  Yes Danford, Suann Larry, MD  cyclobenzaprine (FLEXERIL) 10 MG tablet Take 10 mg by mouth 3 (three) times daily as needed for muscle spasms.  06/05/20  Yes [provider]  dexlansoprazole (DEXILANT) 60 MG capsule Take 1 capsule (60 mg total) by mouth daily. Patient taking differently: Take 60 mg by mouth daily as needed (indigestion).  02/26/20  Yes Glendale Chard, MD  Metamucil Fiber CHEW Chew 3 tablets by mouth daily.   Yes [provider]  traMADol (ULTRAM) 50 MG tablet Take 50 mg by mouth every 6 (six) hours as needed for moderate pain or severe pain.  06/01/20  Yes [provider]  Vitamin D, Ergocalciferol, (DRISDOL) 1.25 MG (50000 UNIT) CAPS capsule TAKE 1 CAPSULE (50,000 UNITS TOTAL) BY MOUTH EVERY 7 (SEVEN) DAYS. Patient taking differently: Take 50,000 Units by mouth 2 (two) times a week. Take on Tues & Fri 05/05/20  Yes Glendale Chard, MD  azithromycin (ZITHROMAX Z-PAK) 250 MG tablet 500 mg PO Day 1 250 mg PO Day 2-5 06/09/20   Nyoka Cowden, Rowe Clack, PA-C  benzonatate (TESSALON) 100 MG capsule Take 1 capsule (100 mg total) by mouth 3 (three) times daily as needed for cough. 06/09/20   Corena Herter, PA-C  predniSONE (DELTASONE) 50 MG tablet Take 1 tablet (50 mg total) by mouth daily with breakfast. 06/09/20   Corena Herter, PA-C    Allergies    Augmentin [amoxicillin-pot clavulanate] and Sulfa antibiotics  Review of Systems   Review of Systems  All other systems reviewed and are negative.   Physical Exam Updated Vital Signs BP (!) 123/109   Pulse 91   Temp 98.1 F (36.7 C) (Oral)   Resp (!) 23   SpO2 100%   Physical Exam Vitals and nursing note reviewed. Exam conducted with a chaperone present.  Constitutional:      General: She is not in acute distress.    Appearance: She is ill-appearing.  HENT:     Head: Normocephalic and atraumatic.      Nose: Congestion present.     Mouth/Throat:     Pharynx: Oropharynx is clear.     Comments: Patent oropharynx.  Mildly erythematous, but no exudates or tonsillar hypertrophy noted.  No trismus.  Tolerating secretions without difficulty. Eyes:     General: No scleral icterus.    Conjunctiva/sclera: Conjunctivae normal.  Cardiovascular:     Rate and Rhythm: Normal rate and regular rhythm.     Pulses: Normal pulses.     Heart sounds: Normal heart sounds.  Pulmonary:     Comments: Mildly increased work of  breathing with tachypnea in the 30s.  Hypoxic to 88% on RA, now placed on 2 L supplemental O2 via [place] and 96%.  Expiratory wheezing auscultated in all lung fields.  Symmetric chest rise.  No distress. Musculoskeletal:     Cervical back: Normal range of motion.  Skin:    General: Skin is dry.     Capillary Refill: Capillary refill takes less than 2 seconds.  Neurological:     Mental Status: She is alert and oriented to person, place, and time.     GCS: GCS eye subscore is 4. GCS verbal subscore is 5. GCS motor subscore is 6.  Psychiatric:        Mood and Affect: Mood normal.        Behavior: Behavior normal.        Thought Content: Thought content normal.     ED Results / Procedures / Treatments   Labs (all labs ordered are listed, but only abnormal results are displayed) Labs Reviewed  CBC WITH DIFFERENTIAL/PLATELET - Abnormal; Notable for the following components:      Result Value   RDW 15.7 (*)    All other components within normal limits  RESPIRATORY PANEL BY RT PCR (FLU A&B, COVID)  COMPREHENSIVE METABOLIC PANEL    EKG None  Radiology DG Chest Portable 1 View  Result Date: 06/09/2020 CLINICAL DATA:  Productive cough, shortness of breath EXAM: PORTABLE CHEST 1 VIEW COMPARISON:  09/12/2018 FINDINGS: Ill-defined left mid lung density was present previously and probably reflects scarring. No pleural effusion or pneumothorax. Stable cardiomediastinal contours.  Posterior cervical fusion hardware. IMPRESSION: No acute process in the chest. Electronically Signed   By: Macy Mis M.D.   On: 06/09/2020 16:43    Procedures Procedures (including critical care time)  Medications Ordered in ED Medications  albuterol (VENTOLIN HFA) 108 (90 Base) MCG/ACT inhaler 6 puff (6 puffs Inhalation Given 06/09/20 1741)  methylPREDNISolone sodium succinate (SOLU-MEDROL) 125 mg/2 mL injection 80 mg (80 mg Intravenous Given 06/09/20 1755)    ED Course  I have reviewed the triage vital signs and the nursing notes.  Pertinent labs & imaging results that were available during my care of the patient were reviewed by me and considered in my medical decision making (see chart for details).    MDM Rules/Calculators/A&P                          Patient's wheezing auscultated in all fields is suggestive of COPD exacerbation versus bronchitis.  Will treat with inhaler and steroids here in the ED.  Labs CBC with differential: No leukocytosis concerning for infection.  No anemia. CMP: No electrolyte derangement or impaired renal/liver function. Respiratory panel by PCR: Pending.  On subsequent evaluation after patient received albuterol and Solu-Medrol, her breathing has improved dramatically.  She is feeling better.  She would prefer to be discharged home, if possible.  Given her improvement, I feel as though that is reasonable.  Her work-up today was reassuring.  We will discharge her home with prednisone burst 50 mg x 5 days, Z-Pak, and her albuterol inhaler.  We will also prescribe her Tessalon Perles given her cough unrelieved by over-the-counter Mucinex.  Patient will follow up with her primary care provider regarding today's encounter and for ongoing evaluation.  Respiratory panel testing still pending.    All of the evaluation and work-up results were discussed with the patient and any family at bedside.  Patient and/or family were  informed that while patient is  appropriate for discharge at this time, some medical emergencies may only develop or become detectable after a period of time.  I specifically instructed patient and/or family to return to return to the ED or seek immediate medical attention for any new or worsening symptoms.  They were provided opportunity to ask any additional questions and have none at this time.  Prior to discharge patient is feeling well, agreeable with plan for discharge home.  They have expressed understanding of verbal discharge instructions as well as return precautions and are agreeable to the plan.   Julie Obrien was evaluated in Emergency Department on 06/09/2020 for the symptoms described in the history of present illness. She was evaluated in the context of the global COVID-19 pandemic, which necessitated consideration that the patient might be at risk for infection with the SARS-CoV-2 virus that causes COVID-19. Institutional protocols and algorithms that pertain to the evaluation of patients at risk for COVID-19 are in a state of rapid change based on information released by regulatory bodies including the CDC and federal and state organizations. These policies and algorithms were followed during the patient's care in the ED.   Final Clinical Impression(s) / ED Diagnoses Final diagnoses:  COPD exacerbation ([place])    Rx / DC Orders ED Discharge Orders         Ordered    azithromycin (ZITHROMAX Z-PAK) 250 MG tablet        06/09/20 1858    benzonatate (TESSALON) 100 MG capsule  3 times daily PRN        06/09/20 1858    predniSONE (DELTASONE) 50 MG tablet  Daily with breakfast        06/09/20 1858           Corena Herter, PA-C 06/09/20 1859    Maudie Flakes, MD 06/09/20 2337

## 2020-06-09 NOTE — ED Provider Notes (Signed)
[facility]    CSN: [id number] Arrival date & time: 06/09/20  1031      History   Chief Complaint Chief Complaint  Patient presents with  . Sore Throat    HPI KALIJAH WESTFALL is a 69 y.o. female.   Patient presents with 2-day history of cough productive of white phlegm, shortness of breath, sore throat, diarrhea.  She denies fever, chills, rash, vomiting, or other symptoms.  Her medical history is significant for COPD, pneumonia, arthritis, kidney stones.  The history is provided by the patient and medical records.    Past Medical History:  Diagnosis Date  . Arthritis    hnp, low back , knees - " all OA"  . Complication of anesthesia    nausea one surgery more recent  . History of kidney stones   . Pneumonia    Feb.- Mar. 2017, not hospitalized, but pending surgery was cancelled ,since then, pt. seen by PCP, told that it was cleared    . PONV (postoperative nausea and vomiting)     Patient Active Problem List   Diagnosis Date Noted  . Adult BMI 32.0-32.9 kg/sq m 10/13/2018  . Hypoxia 09/15/2018  . Tobacco abuse 09/15/2018  . CAP (community acquired pneumonia) 09/15/2018  . Influenza A 09/12/2018  . COPD exacerbation ([place]) 02/11/2018  . Asthma with acute exacerbation 02/10/2018  . Spinal stenosis of lumbar region 01/05/2016  . Thoracic spine fracture, closed, initial encounter 06/21/2015  . 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 BACK SURGERY  08  . CERVICAL DISCECTOMY  09  . CESAREAN SECTION     x4  . CYST EXCISION     vocal cords?  . CYSTO  99   stone  . HARDWARE REMOVAL N/A 06/21/2015   Procedure: Exploration of Fusion and removal of hardware right thoracic level one;  Surgeon: Kary Kos, MD;  Location: [facility];  Service: Neurosurgery;  Laterality: N/A;  . POSTERIOR CERVICAL FUSION/FORAMINOTOMY N/A 05/31/2015   Procedure: Decompression and Laminectomy Cervical three-four   Cervical six-seven  Cervical seven-thoracic-one Posterior cervical fusion with lateral mass fixation;  Surgeon: Kary Kos, MD;  Location: [facility];  Service: Neurosurgery;  Laterality: N/A;  . TONSILLECTOMY    . TUBAL LIGATION      OB History   No obstetric history on file.      Home Medications    Prior to Admission medications   Medication Sig Start Date End Date Taking? Authorizing Provider  Acetaminophen (TYLENOL ARTHRITIS EXT RELIEF PO) Take by mouth. 2 tablets daily    [provider]  albuterol (VENTOLIN HFA) 108 (90 Base) MCG/ACT inhaler Inhale 1-2 puffs into the lungs every 4 (four) hours as needed for wheezing or shortness of breath. 02/13/18   Danford, Suann Larry, MD  calcium citrate-vitamin D (CITRACAL+D) 315-200 MG-UNIT tablet Take 1 tablet by mouth 2 (two) times daily.    [provider]  Cholecalciferol (VITAMIN D3) 1.25 MG (50000 UT) CAPS Take 1 capsule by mouth 2 (two) times a week. Takes on Tuesday and Thursday    [provider]  dexlansoprazole (DEXILANT) 60 MG capsule Take 1 capsule (60 mg total) by mouth daily. 02/26/20   Glendale Chard, MD  traMADol (ULTRAM) 50 MG tablet Take 50 mg by mouth 4 (four) times daily as needed. 06/01/20   [provider]  Vitamin D, Ergocalciferol, (DRISDOL) 1.25 MG (50000 UNIT) CAPS capsule TAKE 1 CAPSULE (50,000  UNITS TOTAL) BY MOUTH EVERY 7 (SEVEN) DAYS. 05/05/20   Glendale Chard, MD    Family History Family History  Problem Relation Age of Onset  . Dementia Mother   . Cancer Mother   . Cancer Sister   . Cancer Maternal Aunt     Social History Social History   Tobacco Use  . Smoking status: Current Every Day Smoker    Packs/day: 0.50    Years: 32.00    Pack years: 16.00    Types: Cigarettes  . Smokeless tobacco: Current User  . Tobacco comment: "I know I have to tell myself this is what I want to do but I am not ready to do that yet"  Vaping Use  . Vaping Use: Never used    Substance Use Topics  . Alcohol use: No  . Drug use: No     Allergies   Augmentin [amoxicillin-pot clavulanate] and Sulfa antibiotics   Review of Systems Review of Systems  Constitutional: Negative for chills and fever.  HENT: Positive for sore throat. Negative for ear pain.   Eyes: Negative for pain and visual disturbance.  Respiratory: Positive for cough and shortness of breath.   Cardiovascular: Negative for chest pain and palpitations.  Gastrointestinal: Positive for diarrhea. Negative for abdominal pain and vomiting.  Genitourinary: Negative for dysuria and hematuria.  Musculoskeletal: Negative for arthralgias and back pain.  Skin: Negative for color change and rash.  Neurological: Negative for seizures and syncope.  All other systems reviewed and are negative.    Physical Exam Triage Vital Signs ED Triage Vitals  Enc Vitals Group     BP      Pulse      Resp      Temp      Temp src      SpO2      Weight      Height      Head Circumference      Peak Flow      Pain Score      Pain Loc      Pain Edu?      Excl. in [place]?    No data found.  Updated Vital Signs BP 122/69   Pulse 92   Temp 98 F (36.7 C) (Oral)   Resp 18   Ht 5\' 1"  (1.549 m)   Wt 181 lb (82.1 kg)   SpO2 93%   BMI 34.20 kg/m   Visual Acuity Right Eye Distance:   Left Eye Distance:   Bilateral Distance:    Right Eye Near:   Left Eye Near:    Bilateral Near:     Physical Exam Vitals and nursing note reviewed.  Constitutional:      General: She is not in acute distress.    Appearance: She is well-developed.  HENT:     Head: Normocephalic and atraumatic.     Right Ear: Tympanic membrane normal.     Left Ear: Tympanic membrane normal.     Nose: Nose normal.     Mouth/Throat:     Mouth: Mucous membranes are moist.     Pharynx: Oropharynx is clear.  Eyes:     Conjunctiva/sclera: Conjunctivae normal.  Cardiovascular:     Rate and Rhythm: Normal rate and regular rhythm.      Heart sounds: Normal heart sounds.  Pulmonary:     Effort: Pulmonary effort is normal. No respiratory distress.     Breath sounds: Wheezing and rhonchi present.     Comments:  Scattered rhonchi and wheezes. No acute respiratory distress.  Abdominal:     Palpations: Abdomen is soft.     Tenderness: There is no abdominal tenderness. There is no guarding or rebound.  Musculoskeletal:     Cervical back: Neck supple.  Skin:    General: Skin is warm and dry.     Findings: No rash.  Neurological:     General: No focal deficit present.     Mental Status: She is alert and oriented to person, place, and time.     Gait: Gait normal.  Psychiatric:        Mood and Affect: Mood normal.        Behavior: Behavior normal.      UC Treatments / Results  Labs (all labs ordered are listed, but only abnormal results are displayed) Labs Reviewed  CULTURE, GROUP A STREP ([place])  POC SARS CORONAVIRUS 2 AG -  ED  POCT RAPID STREP A (OFFICE)    EKG   Radiology No results found.  Procedures Procedures (including critical care time)  Medications Ordered in UC Medications - No data to display  Initial Impression / Assessment and Plan / UC Course  I have reviewed the triage vital signs and the nursing notes.  Pertinent labs & imaging results that were available during my care of the patient were reviewed by me and considered in my medical decision making (see chart for details).   Shortness of breath, hypoxia.  Rapid COVID negative.  O2 sat 88-90% on RA; up to 92-93% on 3L via [place].  Sending patient to the ED.  She declines EMS and states her husband will drive her to [facility] in [place].  She feels stable to go POV.   Final Clinical Impressions(s) / UC Diagnoses   Final diagnoses:  Shortness of breath  Hypoxia     Discharge Instructions     Go to the Emergency Department for evaluation of your shortness of breath and hypoxia.        ED Prescriptions    None     PDMP not  reviewed this encounter.   Sharion Balloon, NP 06/09/20 1125

## 2020-06-09 NOTE — ED Triage Notes (Signed)
Pt reports having  A productive cough and sore throat that began on Monday. Pt also reports having shortness of breath, diarrhea. No known covid exposure.

## 2020-06-09 NOTE — ED Triage Notes (Signed)
Pt reports productive cough, nasal drainage and sore throat x3 days after being around her granddaughter with same symptoms

## 2020-06-09 NOTE — Telephone Encounter (Signed)
Pt called she is having some wheezing, runny noise, trouble breathing. Per RS pt should go the hospital if she's having trouble breathing

## 2020-06-09 NOTE — ED Notes (Signed)
Pt ambulatory to restroom, upon return to room, 02=90-92% on room air. Pt returned to 93-94% on room air after a few minutes. PA aware.

## 2020-06-09 NOTE — Discharge Instructions (Addendum)
Go to the Emergency Department for evaluation of your shortness of breath and hypoxia.

## 2020-06-09 NOTE — Discharge Instructions (Addendum)
Your history, physical exam, and laboratory work-up is suggestive of COPD exacerbation.  Your breathing has improved since albuterol and steroids have been administered here in the ED.  You are no longer requiring oxygen.  However, I encourage you to obtain a pulse oximeter over-the-counter at the pharmacy in the event that you develop shortness of symptoms.  You will need to return to the ED if you become hypoxic.  I have prescribed you Tessalon Perles for your cough to take as needed.  You have also been prescribed antibiotics (azithromycin) and prednisone which I would like you to take, as directed.  You were negative for COVID-19 and influenza testing.  Please follow-up with your primary care provider.  Return to the ED or seek immediate medical attention should you experience any new or worsening symptoms.

## 2020-06-09 NOTE — ED Notes (Signed)
O2 sats 92% w/ 3L [place]. Pt sts she does not feel short of breath.

## 2020-06-09 NOTE — ED Notes (Signed)
.   Patient is being discharged from the Urgent Care and sent to the Emergency Department via POV. Per K. Hall Busing, NP, patient is in need of higher level of care due to being hypoxic. Patient is aware and verbalizes understanding of plan of care.  Vitals:   06/09/20 1043 06/09/20 1100  BP: 122/69   Pulse: 92   Resp: 18   Temp: 98 F (36.7 C)   SpO2: (S) (!) 88% 93%   SpO2 88-91% prior to leaving.

## 2020-06-12 LAB — CULTURE, GROUP A STREP (THRC)

## 2020-06-16 ENCOUNTER — Encounter: Payer: Self-pay | Admitting: Internal Medicine

## 2020-06-16 ENCOUNTER — Ambulatory Visit (INDEPENDENT_AMBULATORY_CARE_PROVIDER_SITE_OTHER): Payer: Medicare Other | Admitting: Internal Medicine

## 2020-06-16 ENCOUNTER — Ambulatory Visit (INDEPENDENT_AMBULATORY_CARE_PROVIDER_SITE_OTHER): Payer: Medicare Other

## 2020-06-16 ENCOUNTER — Other Ambulatory Visit: Payer: Self-pay

## 2020-06-16 VITALS — BP 118/72 | HR 75 | Temp 98.5°F | Ht 61.0 in | Wt 184.4 lb

## 2020-06-16 VITALS — BP 118/72 | HR 75 | Temp 98.5°F | Ht 62.0 in | Wt 187.4 lb

## 2020-06-16 DIAGNOSIS — Z23 Encounter for immunization: Secondary | ICD-10-CM

## 2020-06-16 DIAGNOSIS — F17210 Nicotine dependence, cigarettes, uncomplicated: Secondary | ICD-10-CM

## 2020-06-16 DIAGNOSIS — Z6834 Body mass index (BMI) 34.0-34.9, adult: Secondary | ICD-10-CM | POA: Diagnosis not present

## 2020-06-16 DIAGNOSIS — J209 Acute bronchitis, unspecified: Secondary | ICD-10-CM | POA: Diagnosis not present

## 2020-06-16 DIAGNOSIS — Z0000 Encounter for general adult medical examination without abnormal findings: Secondary | ICD-10-CM | POA: Diagnosis not present

## 2020-06-16 DIAGNOSIS — Z2821 Immunization not carried out because of patient refusal: Secondary | ICD-10-CM | POA: Diagnosis not present

## 2020-06-16 DIAGNOSIS — E6609 Other obesity due to excess calories: Secondary | ICD-10-CM

## 2020-06-16 DIAGNOSIS — Z720 Tobacco use: Secondary | ICD-10-CM

## 2020-06-16 DIAGNOSIS — R0602 Shortness of breath: Secondary | ICD-10-CM

## 2020-06-16 MED ORDER — PREVNAR 13 IM SUSP: 0.5000 mL | INTRAMUSCULAR | 0 refills | Status: AC

## 2020-06-16 MED ORDER — ALBUTEROL SULFATE HFA 108 (90 BASE) MCG/ACT IN AERS: 1.0000 | INHALATION_SPRAY | RESPIRATORY_TRACT | 11 refills | Status: DC | PRN

## 2020-06-16 NOTE — Progress Notes (Signed)
I,Katawbba Wiggins,acting as a Education administrator for Maximino Greenland, MD.,have documented all relevant documentation on the behalf of Maximino Greenland, MD,as directed by  Maximino Greenland, MD while in the presence of Maximino Greenland, MD.  This visit occurred during the SARS-CoV-2 public health emergency.  Safety protocols were in place, including screening questions prior to the visit, additional usage of staff PPE, and extensive cleaning of exam room while observing appropriate contact time as indicated for disinfecting solutions.  Subjective:     Patient ID: Julie Obrien , female    DOB: Oct 28, 1950 , 69 y.o.   MRN: [id number]   Chief Complaint  Patient presents with  . Other    Urgent care/ED f/u    HPI  She is here today for urgent care f/u. She reports she went to Urgent Care last week on 10/27 for cough and congestion. She states she was told her oxygenation was low, so she was sent to the ER for further evaluation. Rapid COVID test was negative. She was given steroids and nebulizer treatment which resulted in an improvement in her sx. She thinks she caught a bug from her granddaughter days prior to her presentation. She was discharged home with prednisone burst, azithromycin and an albuterol inhaler. She reports she is feeling much better today.     Past Medical History:  Diagnosis Date  . Arthritis    hnp, low back , knees - " all OA"  . Complication of anesthesia    nausea one surgery more recent  . History of kidney stones   . Pneumonia    Feb.- Mar. 2017, not hospitalized, but pending surgery was cancelled ,since then, pt. seen by PCP, told that it was cleared    . PONV (postoperative nausea and vomiting)      Family History  Problem Relation Age of Onset  . Dementia Mother   . Cancer Mother   . Cancer Sister   . Cancer Maternal Aunt      Current Outpatient Medications:  .  acetaminophen (TYLENOL) 650 MG CR tablet, Take 1,950 mg by mouth daily., Disp: , Rfl:  .  albuterol  (VENTOLIN HFA) 108 (90 Base) MCG/ACT inhaler, Inhale 1-2 puffs into the lungs every 4 (four) hours as needed for wheezing or shortness of breath., Disp: 18 g, Rfl: 11 .  benzonatate (TESSALON) 100 MG capsule, Take 1 capsule (100 mg total) by mouth 3 (three) times daily as needed for cough., Disp: 21 capsule, Rfl: 0 .  cyclobenzaprine (FLEXERIL) 10 MG tablet, Take 10 mg by mouth 3 (three) times daily as needed for muscle spasms. , Disp: , Rfl:  .  dexlansoprazole (DEXILANT) 60 MG capsule, Take 1 capsule (60 mg total) by mouth daily. (Patient taking differently: Take 60 mg by mouth daily as needed (indigestion). ), Disp: 30 capsule, Rfl: 3 .  Metamucil Fiber CHEW, Chew 3 tablets by mouth daily., Disp: , Rfl:  .  traMADol (ULTRAM) 50 MG tablet, Take 50 mg by mouth every 6 (six) hours as needed for moderate pain or severe pain. , Disp: , Rfl:  .  Vitamin D, Ergocalciferol, (DRISDOL) 1.25 MG (50000 UNIT) CAPS capsule, TAKE 1 CAPSULE (50,000 UNITS TOTAL) BY MOUTH EVERY 7 (SEVEN) DAYS. (Patient taking differently: Take 50,000 Units by mouth 2 (two) times a week. Take on Tues & Fri), Disp: 12 capsule, Rfl: 1   Allergies  Allergen Reactions  . Augmentin [Amoxicillin-Pot Clavulanate] Nausea And Vomiting    "Tore up my stomach" (  verified this with her daughter, a physician)  . Sulfa Antibiotics Hives     Review of Systems  Constitutional: Negative.   Respiratory: Negative.   Cardiovascular: Negative.   Gastrointestinal: Negative.   Neurological: Negative.   Psychiatric/Behavioral: Negative.      Today's Vitals   06/16/20 1606  BP: 118/72  Pulse: 75  Temp: 98.5 F (36.9 C)  TempSrc: Oral  Weight: 184 lb 6.4 oz (83.6 kg)  Height: 5\' 1"  (1.549 m)  PainSc: 6   PainLoc: Back   Body mass index is 34.84 kg/m.   Objective:  Physical Exam Vitals and nursing note reviewed.  Constitutional:      Appearance: Normal appearance. She is obese.  HENT:     Head: Normocephalic and atraumatic.   Cardiovascular:     Rate and Rhythm: Normal rate and regular rhythm.     Heart sounds: Normal heart sounds.  Pulmonary:     Effort: Pulmonary effort is normal.     Breath sounds: Normal breath sounds.  Skin:    General: Skin is warm.  Neurological:     General: No focal deficit present.     Mental Status: She is alert.  Psychiatric:        Mood and Affect: Mood normal.        Behavior: Behavior normal.         Assessment And Plan:     1. Acute bronchitis, unspecified organism Comments: Urgent care and ER records were reviewed in full detail. Pulse ox is 98% today.  Her sx have improved greatly with oral steroids, antibiotics and inhaler. Due to her smoking history, she agrees to Pulmonary eval for PFTs. She likely has underlying COPD due to long-standing history of tobacco use.  - Ambulatory referral to Pulmonology  2. Tobacco dependence due to cigarettes She does not wish to quit at this time. She has had low dose CT performed in [place], [place]. I will request her records today. She is encouraged to cut back on the number of cigarettes smoked per day.   3. Class 1 obesity due to excess calories without serious comorbidity with body mass index (BMI) of 34.0 to 34.9 in adult Comments: Chronic, encouraged to strive for BMI less than 30 to decrease cardiac risk.      Patient was given opportunity to ask questions. Patient verbalized understanding of the plan and was able to repeat key elements of the plan. All questions were answered to their satisfaction.  Maximino Greenland, MD   I, Maximino Greenland, MD, have reviewed all documentation for this visit. The documentation on 06/17/20 for the exam, diagnosis, procedures, and orders are all accurate and complete.  THE PATIENT IS ENCOURAGED TO PRACTICE SOCIAL DISTANCING DUE TO THE COVID-19 PANDEMIC.

## 2020-06-16 NOTE — Patient Instructions (Signed)
Chronic Obstructive Pulmonary Disease Chronic obstructive pulmonary disease (COPD) is a long-term (chronic) lung problem. When you have COPD, it is hard for air to get in and out of your lungs. Usually the condition gets worse over time, and your lungs will never return to normal. There are things you can do to keep yourself as healthy as possible.  Your doctor may treat your condition with: ? Medicines. ? Oxygen. ? Lung surgery.  Your doctor may also recommend: ? Rehabilitation. This includes steps to make your body work better. It may involve a team of specialists. ? Quitting smoking, if you smoke. ? Exercise and changes to your diet. ? Comfort measures (palliative care). Follow these instructions at home: Medicines  Take over-the-counter and prescription medicines only as told by your doctor.  Talk to your doctor before taking any cough or allergy medicines. You may need to avoid medicines that cause your lungs to be dry. Lifestyle  If you smoke, stop. Smoking makes the problem worse. If you need help quitting, ask your doctor.  Avoid being around things that make your breathing worse. This may include smoke, chemicals, and fumes.  Stay active, but remember to rest as well.  Learn and use tips on how to relax.  Make sure you get enough sleep. Most adults need at least 7 hours of sleep every night.  Eat healthy foods. Eat smaller meals more often. Rest before meals. Controlled breathing Learn and use tips on how to control your breathing as told by your doctor. Try:  Breathing in (inhaling) through your nose for 1 second. Then, pucker your lips and breath out (exhale) through your lips for 2 seconds.  Putting one hand on your belly (abdomen). Breathe in slowly through your nose for 1 second. Your hand on your belly should move out. Pucker your lips and breathe out slowly through your lips. Your hand on your belly should move in as you breathe out.  Controlled coughing Learn  and use controlled coughing to clear mucus from your lungs. Follow these steps: 1. Lean your head a little forward. 2. Breathe in deeply. 3. Try to hold your breath for 3 seconds. 4. Keep your mouth slightly open while coughing 2 times. 5. Spit any mucus out into a tissue. 6. Rest and do the steps again 1 or 2 times as needed. General instructions  Make sure you get all the shots (vaccines) that your doctor recommends. Ask your doctor about a flu shot and a pneumonia shot.  Use oxygen therapy and pulmonary rehabilitation if told by your doctor. If you need home oxygen therapy, ask your doctor if you should buy a tool to measure your oxygen level (oximeter).  Make a COPD action plan with your doctor. This helps you to know what to do if you feel worse than usual.  Manage any other conditions you have as told by your doctor.  Avoid going outside when it is very hot, cold, or humid.  Avoid people who have a sickness you can catch (contagious).  Keep all follow-up visits as told by your doctor. This is important. Contact a doctor if:  You cough up more mucus than usual.  There is a change in the color or thickness of the mucus.  It is harder to breathe than usual.  Your breathing is faster than usual.  You have trouble sleeping.  You need to use your medicines more often than usual.  You have trouble doing your normal activities such as getting dressed   or walking around the house. Get help right away if:  You have shortness of breath while resting.  You have shortness of breath that stops you from: ? Being able to talk. ? Doing normal activities.  Your chest hurts for longer than 5 minutes.  Your skin color is more blue than usual.  Your pulse oximeter shows that you have low oxygen for longer than 5 minutes.  You have a fever.  You feel too tired to breathe normally. Summary  Chronic obstructive pulmonary disease (COPD) is a long-term lung problem.  The way your  lungs work will never return to normal. Usually the condition gets worse over time. There are things you can do to keep yourself as healthy as possible.  Take over-the-counter and prescription medicines only as told by your doctor.  If you smoke, stop. Smoking makes the problem worse. This information is not intended to replace advice given to you by your health care provider. Make sure you discuss any questions you have with your health care provider. Document Revised: 07/13/2017 Document Reviewed: 09/04/2016 Elsevier Patient Education  2020 Elsevier Inc.  

## 2020-06-16 NOTE — Progress Notes (Signed)
This visit occurred during the SARS-CoV-2 public health emergency.  Safety protocols were in place, including screening questions prior to the visit, additional usage of staff PPE, and extensive cleaning of exam room while observing appropriate contact time as indicated for disinfecting solutions.  Subjective:   Julie Obrien is a 69 y.o. female who presents for Medicare Annual (Subsequent) preventive examination.  Review of Systems     Cardiac Risk Factors include: advanced age (>63men, >43 women);obesity (BMI >30kg/m2);sedentary lifestyle;smoking/ tobacco exposure     Objective:    Today's Vitals   06/16/20 1607 06/16/20 1608  BP: 118/72   Pulse: 75   Temp: 98.5 F (36.9 C)   TempSrc: Oral   Weight: 187 lb 6.4 oz (85 kg)   Height: 5\' 2"  (1.575 m)   PainSc:  6    Body mass index is 34.28 kg/m.  Advanced Directives 06/16/2020 09/11/2019 04/23/2019 12/03/2018 09/12/2018 02/11/2018 07/19/2017  Does Patient Have a Medical Advance Directive? No No No No No No No  Would patient like information on creating a medical advance directive? - No - Patient declined - No - Patient declined No - Patient declined Yes (ED - Information included in AVS) No - Patient declined    Current Medications (verified) Outpatient Encounter Medications as of 06/16/2020  Medication Sig  . acetaminophen (TYLENOL) 650 MG CR tablet Take 1,950 mg by mouth daily.  Marland Kitchen albuterol (VENTOLIN HFA) 108 (90 Base) MCG/ACT inhaler Inhale 1-2 puffs into the lungs every 4 (four) hours as needed for wheezing or shortness of breath.  . benzonatate (TESSALON) 100 MG capsule Take 1 capsule (100 mg total) by mouth 3 (three) times daily as needed for cough.  . cyclobenzaprine (FLEXERIL) 10 MG tablet Take 10 mg by mouth 3 (three) times daily as needed for muscle spasms.   Marland Kitchen dexlansoprazole (DEXILANT) 60 MG capsule Take 1 capsule (60 mg total) by mouth daily. (Patient taking differently: Take 60 mg by mouth daily as needed (indigestion).  )  . Metamucil Fiber CHEW Chew 3 tablets by mouth daily.  . pneumococcal 13-valent conjugate vaccine (PREVNAR 13) SUSP injection Inject 0.5 mLs into the muscle tomorrow at 10 am for 1 dose.  . traMADol (ULTRAM) 50 MG tablet Take 50 mg by mouth every 6 (six) hours as needed for moderate pain or severe pain.   . Vitamin D, Ergocalciferol, (DRISDOL) 1.25 MG (50000 UNIT) CAPS capsule TAKE 1 CAPSULE (50,000 UNITS TOTAL) BY MOUTH EVERY 7 (SEVEN) DAYS. (Patient taking differently: Take 50,000 Units by mouth 2 (two) times a week. Take on Tues & Fri)   No facility-administered encounter medications on file as of 06/16/2020.    Allergies (verified) Augmentin [amoxicillin-pot clavulanate] and Sulfa antibiotics   History: Past Medical History:  Diagnosis Date  . Arthritis    hnp, low back , knees - " all OA"  . Complication of anesthesia    nausea one surgery more recent  . History of kidney stones   . Pneumonia    Feb.- Mar. 2017, not hospitalized, but pending surgery was cancelled ,since then, pt. seen by PCP, told that it was cleared    . PONV (postoperative nausea and vomiting)    Past Surgical History:  Procedure Laterality Date  . BACK SURGERY  08  . CERVICAL DISCECTOMY  09  . CESAREAN SECTION     x4  . CYST EXCISION     vocal cords?  . CYSTO  99   stone  . HARDWARE REMOVAL N/A 06/21/2015  Procedure: Exploration of Fusion and removal of hardware right thoracic level one;  Surgeon: Kary Kos, MD;  Location: [facility];  Service: Neurosurgery;  Laterality: N/A;  . POSTERIOR CERVICAL FUSION/FORAMINOTOMY N/A 05/31/2015   Procedure: Decompression and Laminectomy Cervical three-four  Cervical six-seven  Cervical seven-thoracic-one Posterior cervical fusion with lateral mass fixation;  Surgeon: Kary Kos, MD;  Location: [facility];  Service: Neurosurgery;  Laterality: N/A;  . TONSILLECTOMY    . TUBAL LIGATION     Family History  Problem Relation Age of Onset  . Dementia Mother   .  Cancer Mother   . Cancer Sister   . Cancer Maternal Aunt    Social History   Socioeconomic History  . Marital status: Married    Spouse name: Not on file  . Number of children: 4  . Years of education: Not on file  . Highest education level: Not on file  Occupational History  . Occupation: retired  Tobacco Use  . Smoking status: Current Every Day Smoker    Packs/day: 0.50    Years: 32.00    Pack years: 16.00    Types: Cigarettes  . Smokeless tobacco: Current User  . Tobacco comment: "I know I have to tell myself this is what I want to do but I am not ready to do that yet"  Vaping Use  . Vaping Use: Never used  Substance and Sexual Activity  . Alcohol use: No  . Drug use: No  . Sexual activity: Not Currently  Other Topics Concern  . Not on file  Social History Narrative  . Not on file   Social Determinants of Health   Financial Resource Strain: Low Risk   . Difficulty of Paying Living Expenses: Not hard at all  Food Insecurity: No Food Insecurity  . Worried About Charity fundraiser in the Last Year: Never true  . Ran Out of Food in the Last Year: Never true  Transportation Needs: No Transportation Needs  . Lack of Transportation (Medical): No  . Lack of Transportation (Non-Medical): No  Physical Activity: Inactive  . Days of Exercise per Week: 0 days  . Minutes of Exercise per Session: 0 min  Stress: No Stress Concern Present  . Feeling of Stress : Not at all  Social Connections:   . Frequency of Communication with Friends and Family: Not on file  . Frequency of Social Gatherings with Friends and Family: Not on file  . Attends Religious Services: Not on file  . Active Member of Clubs or Organizations: Not on file  . Attends Archivist Meetings: Not on file  . Marital Status: Not on file    Tobacco Counseling Ready to quit: Not Answered Counseling given: Not Answered Comment: "I know I have to tell myself this is what I want to do but I am not  ready to do that yet"   Clinical Intake:  Pre-visit preparation completed: Yes  Pain : 0-10 Pain Score: 6  Pain Type: Chronic pain Pain Location: Back Pain Orientation: Lower, Mid, Upper Pain Descriptors / Indicators: Sharp Pain Onset: More than a month ago Pain Frequency: Constant     Nutritional Status: BMI > 30  Obese Nutritional Risks: None Diabetes: No  How often do you need to have someone help you when you read instructions, pamphlets, or other written materials from your doctor or pharmacy?: 1 - Never What is the last grade level you completed in school?: 14 yrs  Diabetic? no  Interpreter Needed?: No  Information entered by :: NAllen LPN   Activities of Daily Living In your present state of health, do you have any difficulty performing the following activities: 06/16/2020  Hearing? N  Vision? N  Difficulty concentrating or making decisions? N  Walking or climbing stairs? N  Dressing or bathing? N  Doing errands, shopping? N  Preparing Food and eating ? N  Using the Toilet? N  In the past six months, have you accidently leaked urine? N  Do you have problems with loss of bowel control? N  Managing your Medications? N  Managing your Finances? N  Housekeeping or managing your Housekeeping? N  Some recent data might be hidden    Patient Care Team: Glendale Chard, MD as PCP - General Little, Claudette Stapler, RN as Case Manager  Indicate any recent Medical Services you may have received from other than [facility] providers in the past year (date may be approximate).     Assessment:   This is a routine wellness examination for Nakeitha.  Hearing/Vision screen  Hearing Screening   125Hz  250Hz  500Hz  1000Hz  2000Hz  3000Hz  4000Hz  6000Hz  8000Hz   Right ear:           Left ear:           Vision Screening Comments: Regular eye exams, [facility]  Dietary issues and exercise activities discussed: Current Exercise Habits: The patient does not participate in regular exercise  at present  Goals    . Patient Stated     04/23/2019, no goals    . Patient Stated     06/16/2020, wants to quit smoking      Depression Screen PHQ 2/9 Scores 06/16/2020 06/26/2019 04/23/2019 12/03/2018 09/11/2018 08/20/2018 07/24/2018  PHQ - 2 Score 0 0 0 0 0 0 0    Fall Risk Fall Risk  06/16/2020 06/26/2019 04/23/2019 09/11/2018 08/20/2018  Falls in the past year? 0 0 0 0 0  Comment - - - - -  Risk for fall due to : No Fall Risks - - - -  Follow up Falls evaluation completed;Education provided;Falls prevention discussed - Falls evaluation completed;Education provided;Falls prevention discussed - -    Any stairs in or around the home? Yes  If so, are there any without handrails? No  Home free of loose throw rugs in walkways, pet beds, electrical cords, etc? Yes  Adequate lighting in your home to reduce risk of falls? Yes   ASSISTIVE DEVICES UTILIZED TO PREVENT FALLS:  Life alert? No  Use of a cane, walker or w/c? No  Grab bars in the bathroom? No  Shower chair or bench in shower? No  Elevated toilet seat or a handicapped toilet? No   TIMED UP AND GO:  Was the test performed? No . .   Gait steady and fast without use of assistive device  Cognitive Function:     6CIT Screen 06/16/2020 04/23/2019  What Year? 0 points 0 points  What month? 0 points 0 points  What time? 0 points 0 points  Count back from 20 0 points 0 points  Months in reverse 0 points 0 points  Repeat phrase 0 points 0 points  Total Score 0 0    Immunizations Immunization History  Administered Date(s) Administered  . PFIZER SARS-COV-2 Vaccination 09/20/2019, 10/11/2019  . Pneumococcal Polysaccharide-23 05/09/2010  . Tdap 09/18/2010    TDAP status: Up to date Flu Vaccine status: Declined, Education has been provided regarding the importance of this vaccine but patient  still declined. Advised may receive this vaccine at local pharmacy or Health Dept. Aware to provide a copy of the vaccination record if  obtained from local pharmacy or Health Dept. Verbalized acceptance and understanding. Pneumococcal vaccine status: sent to pharmacy Covid-19 vaccine status: Completed vaccines  Qualifies for Shingles Vaccine? Yes   Zostavax completed No   Shingrix Completed?: No.    Education has been provided regarding the importance of this vaccine. Patient has been advised to call insurance company to determine out of pocket expense if they have not yet received this vaccine. Advised may also receive vaccine at local pharmacy or Health Dept. Verbalized acceptance and understanding.  Screening Tests Health Maintenance  Topic Date Due  . PNA vac Low Risk Adult (1 of 2 - PCV13) 06/26/2015  . INFLUENZA VACCINE  11/11/2020 (Originally 03/14/2020)  . TETANUS/TDAP  09/18/2020  . MAMMOGRAM  06/25/2021  . COLONOSCOPY  03/06/2027  . DEXA SCAN  Completed  . COVID-19 Vaccine  Completed  . Hepatitis C Screening  Completed    Health Maintenance  Health Maintenance Due  Topic Date Due  . PNA vac Low Risk Adult (1 of 2 - PCV13) 06/26/2015    Colorectal cancer screening: Completed 03/05/2017. Repeat every 10 years Mammogram status: Completed 06/26/2019. Repeat every year Bone Density status: Completed 06/26/2019. Results reflect: Bone density results: OSTEOPENIA. Repeat every 2 years.  Lung Cancer Screening: (Low Dose CT Chest recommended if Age 20-80 years, 30 pack-year currently smoking OR have quit w/in 15years.) does not qualify.   Lung Cancer Screening Referral: no  Additional Screening:  Hepatitis C Screening: does qualify; Completed 01/09/2013  Vision Screening: Recommended annual ophthalmology exams for early detection of glaucoma and other disorders of the eye. Is the patient up to date with their annual eye exam?  Yes  Who is the provider or what is the name of the office in which the patient attends annual eye exams? [facility] If pt is not established with a provider, would they like to be  referred to a provider to establish care? No .   Dental Screening: Recommended annual dental exams for proper oral hygiene  Community Resource Referral / Chronic Care Management: CRR required this visit?  No   CCM required this visit?  No      Plan:     I have personally reviewed and noted the following in the patient's chart:   . Medical and social history . Use of alcohol, tobacco or illicit drugs  . Current medications and supplements . Functional ability and status . Nutritional status . Physical activity . Advanced directives . List of other physicians . Hospitalizations, surgeries, and ER visits in previous 12 months . Vitals . Screenings to include cognitive, depression, and falls . Referrals and appointments  In addition, I have reviewed and discussed with patient certain preventive protocols, quality metrics, and best practice recommendations. A written personalized care plan for preventive services as well as general preventive health recommendations were provided to patient.     Kellie Simmering, LPN   40/08/270   Nurse Notes:

## 2020-06-16 NOTE — Patient Instructions (Signed)
Julie Obrien , Thank you for taking time to come for your Medicare Wellness Visit. I appreciate your ongoing commitment to your health goals. Please review the following plan we discussed and let me know if I can assist you in the future.   Screening recommendations/referrals: Colonoscopy: completed 03/05/2017, due 03/06/2027 Mammogram: completed 06/26/2019, due 06/25/2020 Bone Density: completed 06/26/2019, due 06/25/2021 Recommended yearly ophthalmology/optometry visit for glaucoma screening and checkup Recommended yearly dental visit for hygiene and checkup  Vaccinations: Influenza vaccine: decline Pneumococcal vaccine: sent to pharmacy Tdap vaccine: completed 09/18/2010 Shingles vaccine: discussed   Covid-19: 10/11/2019, 09/20/2019  Advanced directives: Advance directive discussed with you today. Even though you declined this today please call our office should you change your mind and we can give you the proper paperwork for you to fill out.   Conditions/risks identified: smoking  Next appointment: Follow up in one year for your annual wellness visit    Preventive Care 4 Years and Older, Female Preventive care refers to lifestyle choices and visits with your health care provider that can promote health and wellness. What does preventive care include?  A yearly physical exam. This is also called an annual well check.  Dental exams once or twice a year.  Routine eye exams. Ask your health care provider how often you should have your eyes checked.  Personal lifestyle choices, including:  Daily care of your teeth and gums.  Regular physical activity.  Eating a healthy diet.  Avoiding tobacco and drug use.  Limiting alcohol use.  Practicing safe sex.  Taking low-dose aspirin every day.  Taking vitamin and mineral supplements as recommended by your health care provider. What happens during an annual well check? The services and screenings done by your health care provider  during your annual well check will depend on your age, overall health, lifestyle risk factors, and family history of disease. Counseling  Your health care provider may ask you questions about your:  Alcohol use.  Tobacco use.  Drug use.  Emotional well-being.  Home and relationship well-being.  Sexual activity.  Eating habits.  History of falls.  Memory and ability to understand (cognition).  Work and work Statistician.  Reproductive health. Screening  You may have the following tests or measurements:  Height, weight, and BMI.  Blood pressure.  Lipid and cholesterol levels. These may be checked every 5 years, or more frequently if you are over 8 years old.  Skin check.  Lung cancer screening. You may have this screening every year starting at age 9 if you have a 30-pack-year history of smoking and currently smoke or have quit within the past 15 years.  Fecal occult blood test (FOBT) of the stool. You may have this test every year starting at age 52.  Flexible sigmoidoscopy or colonoscopy. You may have a sigmoidoscopy every 5 years or a colonoscopy every 10 years starting at age 56.  Hepatitis C blood test.  Hepatitis B blood test.  Sexually transmitted disease (STD) testing.  Diabetes screening. This is done by checking your blood sugar (glucose) after you have not eaten for a while (fasting). You may have this done every 1-3 years.  Bone density scan. This is done to screen for osteoporosis. You may have this done starting at age 52.  Mammogram. This may be done every 1-2 years. Talk to your health care provider about how often you should have regular mammograms. Talk with your health care provider about your test results, treatment options, and if necessary, the  need for more tests. Vaccines  Your health care provider may recommend certain vaccines, such as:  Influenza vaccine. This is recommended every year.  Tetanus, diphtheria, and acellular pertussis  (Tdap, Td) vaccine. You may need a Td booster every 10 years.  Zoster vaccine. You may need this after age 51.  Pneumococcal 13-valent conjugate (PCV13) vaccine. One dose is recommended after age 76.  Pneumococcal polysaccharide (PPSV23) vaccine. One dose is recommended after age 45. Talk to your health care provider about which screenings and vaccines you need and how often you need them. This information is not intended to replace advice given to you by your health care provider. Make sure you discuss any questions you have with your health care provider. Document Released: 08/27/2015 Document Revised: 04/19/2016 Document Reviewed: 06/01/2015 Elsevier Interactive Patient Education  2017 [place] Prevention in the Home Falls can cause injuries. They can happen to people of all ages. There are many things you can do to make your home safe and to help prevent falls. What can I do on the outside of my home?  Regularly fix the edges of walkways and driveways and fix any cracks.  Remove anything that might make you trip as you walk through a door, such as a raised step or threshold.  Trim any bushes or trees on the path to your home.  Use bright outdoor lighting.  Clear any walking paths of anything that might make someone trip, such as rocks or tools.  Regularly check to see if handrails are loose or broken. Make sure that both sides of any steps have handrails.  Any raised decks and porches should have guardrails on the edges.  Have any leaves, snow, or ice cleared regularly.  Use sand or salt on walking paths during winter.  Clean up any spills in your garage right away. This includes oil or grease spills. What can I do in the bathroom?  Use night lights.  Install grab bars by the toilet and in the tub and shower. Do not use towel bars as grab bars.  Use non-skid mats or decals in the tub or shower.  If you need to sit down in the shower, use a plastic, non-slip  stool.  Keep the floor dry. Clean up any water that spills on the floor as soon as it happens.  Remove soap buildup in the tub or shower regularly.  Attach bath mats securely with double-sided non-slip rug tape.  Do not have throw rugs and other things on the floor that can make you trip. What can I do in the bedroom?  Use night lights.  Make sure that you have a light by your bed that is easy to reach.  Do not use any sheets or blankets that are too big for your bed. They should not hang down onto the floor.  Have a firm chair that has side arms. You can use this for support while you get dressed.  Do not have throw rugs and other things on the floor that can make you trip. What can I do in the kitchen?  Clean up any spills right away.  Avoid walking on wet floors.  Keep items that you use a lot in easy-to-reach places.  If you need to reach something above you, use a strong step stool that has a grab bar.  Keep electrical cords out of the way.  Do not use floor polish or wax that makes floors slippery. If you must use wax,  use non-skid floor wax.  Do not have throw rugs and other things on the floor that can make you trip. What can I do with my stairs?  Do not leave any items on the stairs.  Make sure that there are handrails on both sides of the stairs and use them. Fix handrails that are broken or loose. Make sure that handrails are as long as the stairways.  Check any carpeting to make sure that it is firmly attached to the stairs. Fix any carpet that is loose or worn.  Avoid having throw rugs at the top or bottom of the stairs. If you do have throw rugs, attach them to the floor with carpet tape.  Make sure that you have a light switch at the top of the stairs and the bottom of the stairs. If you do not have them, ask someone to add them for you. What else can I do to help prevent falls?  Wear shoes that:  Do not have high heels.  Have rubber bottoms.  Are  comfortable and fit you well.  Are closed at the toe. Do not wear sandals.  If you use a stepladder:  Make sure that it is fully opened. Do not climb a closed stepladder.  Make sure that both sides of the stepladder are locked into place.  Ask someone to hold it for you, if possible.  Clearly mark and make sure that you can see:  Any grab bars or handrails.  First and last steps.  Where the edge of each step is.  Use tools that help you move around (mobility aids) if they are needed. These include:  Canes.  Walkers.  Scooters.  Crutches.  Turn on the lights when you go into a dark area. Replace any light bulbs as soon as they burn out.  Set up your furniture so you have a clear path. Avoid moving your furniture around.  If any of your floors are uneven, fix them.  If there are any pets around you, be aware of where they are.  Review your medicines with your doctor. Some medicines can make you feel dizzy. This can increase your chance of falling. Ask your doctor what other things that you can do to help prevent falls. This information is not intended to replace advice given to you by your health care provider. Make sure you discuss any questions you have with your health care provider. Document Released: 05/27/2009 Document Revised: 01/06/2016 Document Reviewed: 09/04/2014 Elsevier Interactive Patient Education  2017 [employer].

## 2020-06-17 ENCOUNTER — Encounter: Payer: Self-pay | Admitting: Internal Medicine

## 2020-07-05 DIAGNOSIS — Z1231 Encounter for screening mammogram for malignant neoplasm of breast: Secondary | ICD-10-CM | POA: Diagnosis not present

## 2020-07-05 LAB — HM MAMMOGRAPHY

## 2020-07-16 DIAGNOSIS — M542 Cervicalgia: Secondary | ICD-10-CM | POA: Diagnosis not present

## 2020-07-16 DIAGNOSIS — Z6831 Body mass index (BMI) 31.0-31.9, adult: Secondary | ICD-10-CM | POA: Diagnosis not present

## 2020-07-16 DIAGNOSIS — R030 Elevated blood-pressure reading, without diagnosis of hypertension: Secondary | ICD-10-CM | POA: Diagnosis not present

## 2020-07-28 DIAGNOSIS — M542 Cervicalgia: Secondary | ICD-10-CM | POA: Diagnosis not present

## 2020-07-28 DIAGNOSIS — M4802 Spinal stenosis, cervical region: Secondary | ICD-10-CM | POA: Diagnosis not present

## 2020-07-29 DIAGNOSIS — M542 Cervicalgia: Secondary | ICD-10-CM | POA: Diagnosis not present

## 2020-07-29 DIAGNOSIS — M47816 Spondylosis without myelopathy or radiculopathy, lumbar region: Secondary | ICD-10-CM | POA: Diagnosis not present

## 2020-07-29 DIAGNOSIS — R030 Elevated blood-pressure reading, without diagnosis of hypertension: Secondary | ICD-10-CM | POA: Diagnosis not present

## 2020-07-29 DIAGNOSIS — Z6831 Body mass index (BMI) 31.0-31.9, adult: Secondary | ICD-10-CM | POA: Diagnosis not present

## 2020-09-14 ENCOUNTER — Institutional Professional Consult (permissible substitution): Payer: Medicare Other | Admitting: Pulmonary Disease

## 2020-10-15 ENCOUNTER — Other Ambulatory Visit: Payer: Self-pay | Admitting: Internal Medicine

## 2020-10-18 ENCOUNTER — Institutional Professional Consult (permissible substitution): Payer: Medicare Other | Admitting: Pulmonary Disease

## 2020-11-02 ENCOUNTER — Ambulatory Visit (INDEPENDENT_AMBULATORY_CARE_PROVIDER_SITE_OTHER): Payer: Medicare Other | Admitting: Pulmonary Disease

## 2020-11-02 ENCOUNTER — Encounter: Payer: Self-pay | Admitting: Pulmonary Disease

## 2020-11-02 ENCOUNTER — Other Ambulatory Visit: Payer: Self-pay

## 2020-11-02 VITALS — BP 126/74 | HR 84 | Temp 97.4°F | Ht 62.0 in | Wt 182.0 lb

## 2020-11-02 DIAGNOSIS — Z720 Tobacco use: Secondary | ICD-10-CM

## 2020-11-02 DIAGNOSIS — J40 Bronchitis, not specified as acute or chronic: Secondary | ICD-10-CM

## 2020-11-02 DIAGNOSIS — R059 Cough, unspecified: Secondary | ICD-10-CM | POA: Diagnosis not present

## 2020-11-02 DIAGNOSIS — R918 Other nonspecific abnormal finding of lung field: Secondary | ICD-10-CM

## 2020-11-02 NOTE — Patient Instructions (Addendum)
We will schedule you for a CT Chest scan  We will have you come back in 3 months for pulmonary function tests and follow up visit.   You can use a daily nicotine patch in order to reduce the number of cigarettes you are smoking each day. Please work on getting down to 5 cigarettes per day by next visit.  Continue as needed use of albuterol 1-2 puffs as needed for cough or shortness of breath.

## 2020-11-02 NOTE — Progress Notes (Signed)
Synopsis: Referred in March 2022 by Dr. Glendale Chard for acute bronchitis  Subjective:   PATIENT ID: Julie Obrien GENDER: female DOB: 1950/08/29, MRN: [id number]   HPI  Chief Complaint  Patient presents with  . Consult    Referred by PCP for chronic bronchitis. States every cold she has turns into bronchitis. Last episode was back in November 2021. States she has felt well in the last few months. Denies ever being on oxygen. Has an albuterol inhaler but rarely uses it.    Julie Obrien is a 70 year old woman, daily smoker who is referred to pulmonary clinic for acute bronchitis.   She reports having an episode of bronchitis late Novemeber early December each year over the past 10 years. She feels that she develops cold like symptoms with sinus congestion and runny nose that will then go to her chest with cough and sputum production and wheezing. She has been treated with prednisone and azithromyin with use of an albuterol inhaler during these times with resolution of her symptoms.   Otherwise she denies cough, dyspnea or wheezing. She denies issues with GERD or sinus congestion with drainage.   She has smoked half a pack per day since 1990. She was previously on wellbutrin but stopped this medicaiton due to vivid dreams. She has nicotine patches and gum at home but has not used these products yet.   Her sister was diagnosed with lung cancer at age 37 and was a non-smoker.   Past Medical History:  Diagnosis Date  . Arthritis    hnp, low back , knees - " all OA"  . Complication of anesthesia    nausea one surgery more recent  . History of kidney stones   . Pneumonia    Feb.- Mar. 2017, not hospitalized, but pending surgery was cancelled ,since then, pt. seen by PCP, told that it was cleared    . PONV (postoperative nausea and vomiting)      Family History  Problem Relation Age of Onset  . Dementia Mother   . Cancer Mother   . Cancer Sister   . Cancer Maternal Aunt       Social History   Socioeconomic History  . Marital status: Married    Spouse name: Not on file  . Number of children: 4  . Years of education: Not on file  . Highest education level: Not on file  Occupational History  . Occupation: retired  Tobacco Use  . Smoking status: Current Every Day Smoker    Packs/day: 0.50    Years: 32.00    Pack years: 16.00    Types: Cigarettes  . Smokeless tobacco: Current User  . Tobacco comment: "I know I have to tell myself this is what I want to do but I am not ready to do that yet"  Vaping Use  . Vaping Use: Never used  Substance and Sexual Activity  . Alcohol use: No  . Drug use: No  . Sexual activity: Not Currently  Other Topics Concern  . Not on file  Social History Narrative  . Not on file   Social Determinants of Health   Financial Resource Strain: Low Risk   . Difficulty of Paying Living Expenses: Not hard at all  Food Insecurity: No Food Insecurity  . Worried About Charity fundraiser in the Last Year: Never true  . Ran Out of Food in the Last Year: Never true  Transportation Needs: No Transportation Needs  . Lack  of Transportation (Medical): No  . Lack of Transportation (Non-Medical): No  Physical Activity: Inactive  . Days of Exercise per Week: 0 days  . Minutes of Exercise per Session: 0 min  Stress: No Stress Concern Present  . Feeling of Stress : Not at all  Social Connections: Not on file  Intimate Partner Violence: Not on file     Allergies  Allergen Reactions  . Augmentin [Amoxicillin-Pot Clavulanate] Nausea And Vomiting    "Tore up my stomach" (verified this with her daughter, a physician)  . Sulfa Antibiotics Hives     Outpatient Medications Prior to Visit  Medication Sig Dispense Refill  . acetaminophen (TYLENOL) 650 MG CR tablet Take 1,950 mg by mouth daily.    Marland Kitchen albuterol (VENTOLIN HFA) 108 (90 Base) MCG/ACT inhaler Inhale 1-2 puffs into the lungs every 4 (four) hours as needed for wheezing or shortness  of breath. 18 g 11  . benzonatate (TESSALON) 100 MG capsule Take 1 capsule (100 mg total) by mouth 3 (three) times daily as needed for cough. 21 capsule 0  . cyclobenzaprine (FLEXERIL) 10 MG tablet Take 10 mg by mouth 3 (three) times daily as needed for muscle spasms.     Marland Kitchen dexlansoprazole (DEXILANT) 60 MG capsule Take 1 capsule (60 mg total) by mouth daily. (Patient taking differently: Take 60 mg by mouth daily as needed (indigestion).) 30 capsule 3  . Metamucil Fiber CHEW Chew 3 tablets by mouth daily.    . traMADol (ULTRAM) 50 MG tablet Take 50 mg by mouth every 6 (six) hours as needed for moderate pain or severe pain.     . Vitamin D, Ergocalciferol, (DRISDOL) 1.25 MG (50000 UNIT) CAPS capsule Take 1 capsule (50,000 Units total) by mouth 2 (two) times a week. Take on Tues & Fri 12 capsule 0   No facility-administered medications prior to visit.    Review of Systems  Constitutional: Negative for chills, fever, malaise/fatigue and weight loss.  HENT: Negative for congestion, sinus pain and sore throat.   Eyes: Negative.   Respiratory: Negative for cough, hemoptysis, sputum production, shortness of breath and wheezing.   Cardiovascular: Negative for chest pain, palpitations, orthopnea, claudication and leg swelling.  Gastrointestinal: Negative for abdominal pain, heartburn, nausea and vomiting.  Genitourinary: Negative.   Musculoskeletal: Negative for joint pain and myalgias.  Skin: Negative for rash.  Neurological: Negative for weakness.  Endo/Heme/Allergies: Negative.   Psychiatric/Behavioral: Negative.     Objective:   Vitals:   11/02/20 1131  BP: 126/74  Pulse: 84  Temp: (!) 97.4 F (36.3 C)  TempSrc: Temporal  SpO2: 97%  Weight: 182 lb (82.6 kg)  Height: 5\' 2"  (1.575 m)    Physical Exam Constitutional:      General: She is not in acute distress.    Appearance: She is not ill-appearing.  HENT:     Head: Normocephalic and atraumatic.  Eyes:     General: No scleral  icterus.    Conjunctiva/sclera: Conjunctivae normal.     Pupils: Pupils are equal, round, and reactive to light.  Cardiovascular:     Rate and Rhythm: Normal rate and regular rhythm.     Pulses: Normal pulses.     Heart sounds: Normal heart sounds. No murmur heard.   Pulmonary:     Effort: Pulmonary effort is normal.     Breath sounds: Normal breath sounds. No wheezing, rhonchi or rales.  Abdominal:     General: Bowel sounds are normal.     Palpations:  Abdomen is soft.  Musculoskeletal:     Right lower leg: No edema.     Left lower leg: No edema.  Lymphadenopathy:     Cervical: No cervical adenopathy.  Skin:    General: Skin is warm and dry.  Neurological:     General: No focal deficit present.     Mental Status: She is alert.  Psychiatric:        Mood and Affect: Mood normal.        Behavior: Behavior normal.        Thought Content: Thought content normal.        Judgment: Judgment normal.     CBC    Component Value Date/Time   WBC 8.4 06/09/2020 1733   RBC 4.93 06/09/2020 1733   HGB 13.1 06/09/2020 1733   HGB 12.4 01/20/2020 0939   HCT 42.2 06/09/2020 1733   HCT 36.9 01/20/2020 0939   PLT 353 06/09/2020 1733   PLT 313 01/20/2020 0939   MCV 85.6 06/09/2020 1733   MCV 81 01/20/2020 0939   MCH 26.6 06/09/2020 1733   MCHC 31.0 06/09/2020 1733   RDW 15.7 (H) 06/09/2020 1733   RDW 13.1 01/20/2020 0939   LYMPHSABS 2.1 06/09/2020 1733   LYMPHSABS 3.0 01/20/2020 0939   MONOABS 0.6 06/09/2020 1733   EOSABS 0.3 06/09/2020 1733   EOSABS 0.1 01/20/2020 0939   BASOSABS 0.0 06/09/2020 1733   BASOSABS 0.0 01/20/2020 0939   BMP Latest Ref Rng & Units 06/09/2020 01/20/2020 09/11/2019  Glucose 70 - 99 mg/dL 99 94 116(H)  BUN 8 - 23 mg/dL 8 11 12   Creatinine 0.44 - 1.00 mg/dL 0.68 0.78 0.72  BUN/Creat Ratio 12 - 28 - 14 -  Sodium 135 - 145 mmol/L 140 139 142  Potassium 3.5 - 5.1 mmol/L 3.8 5.0 4.2  Chloride 98 - 111 mmol/L 103 103 108  CO2 22 - 32 mmol/L 26 23 20(L)   Calcium 8.9 - 10.3 mg/dL 9.3 9.6 9.5   Chest imaging: CXR 06/09/20 Ill-defined left mid lung density was present previously and probably reflects scarring. No pleural effusion or pneumothorax. Stable cardiomediastinal contours. Posterior cervical fusion Hardware.  PFT: No flowsheet data found.  Labs: 04/23/19: negative ANA, CCP and RA factor.   Assessment & Plan:   Bronchitis - Plan: Pulmonary Function Test  Tobacco use  Cough  Abnormal findings on diagnostic imaging of lung - Plan: CT Chest Wo Contrast  Discussion: Julie Obrien is a 70 year old woman, daily smoker who is referred to pulmonary clinic for acute bronchitis.   She has history of recurrent episodes of bronchitis in the late fall or early winter months. Otherwise she remains asymptomatic in regards to her respiratory status.   We will check a CT Chest scan based on the abnormalities of her recent chest radiograph in 05/2020, her family history of lung cancer and her smoking history.   We will check pulmonary function tests at follow up visit in 3 months.   We discussed smoking cessation. She is to start using the nicotine patches daily in order to reduce her daily cigarette count. The goal is to reduce her cigarettes to 5 per day from 10 by the follow up visit.  She is to continue as needed use of albuterol.   Follow up in 3 months.   Julie Jackson, MD [facility] Office: [phone]   Current Outpatient Medications:  .  acetaminophen (TYLENOL) 650 MG CR tablet, Take 1,950 mg by  mouth daily., Disp: , Rfl:  .  albuterol (VENTOLIN HFA) 108 (90 Base) MCG/ACT inhaler, Inhale 1-2 puffs into the lungs every 4 (four) hours as needed for wheezing or shortness of breath., Disp: 18 g, Rfl: 11 .  benzonatate (TESSALON) 100 MG capsule, Take 1 capsule (100 mg total) by mouth 3 (three) times daily as needed for cough., Disp: 21 capsule, Rfl: 0 .  cyclobenzaprine (FLEXERIL) 10 MG tablet, Take  10 mg by mouth 3 (three) times daily as needed for muscle spasms. , Disp: , Rfl:  .  dexlansoprazole (DEXILANT) 60 MG capsule, Take 1 capsule (60 mg total) by mouth daily. (Patient taking differently: Take 60 mg by mouth daily as needed (indigestion).), Disp: 30 capsule, Rfl: 3 .  Metamucil Fiber CHEW, Chew 3 tablets by mouth daily., Disp: , Rfl:  .  traMADol (ULTRAM) 50 MG tablet, Take 50 mg by mouth every 6 (six) hours as needed for moderate pain or severe pain. , Disp: , Rfl:  .  Vitamin D, Ergocalciferol, (DRISDOL) 1.25 MG (50000 UNIT) CAPS capsule, Take 1 capsule (50,000 Units total) by mouth 2 (two) times a week. Take on Tues & Fri, Disp: 12 capsule, Rfl: 0

## 2020-11-05 DIAGNOSIS — H43813 Vitreous degeneration, bilateral: Secondary | ICD-10-CM | POA: Diagnosis not present

## 2020-11-20 ENCOUNTER — Ambulatory Visit
Admission: RE | Admit: 2020-11-20 | Discharge: 2020-11-20 | Disposition: A | Discharge status: Home / Self Care | Payer: Medicare Other | Source: Ambulatory Visit | Attending: Pulmonary Disease | Admitting: Pulmonary Disease

## 2020-11-20 DIAGNOSIS — R918 Other nonspecific abnormal finding of lung field: Secondary | ICD-10-CM | POA: Diagnosis not present

## 2020-11-20 DIAGNOSIS — I700 Atherosclerosis of aorta: Secondary | ICD-10-CM | POA: Diagnosis not present

## 2020-11-20 DIAGNOSIS — J9811 Atelectasis: Secondary | ICD-10-CM | POA: Diagnosis not present

## 2020-11-20 DIAGNOSIS — I2510 Atherosclerotic heart disease of native coronary artery without angina pectoris: Secondary | ICD-10-CM | POA: Diagnosis not present

## 2020-11-25 DIAGNOSIS — M5416 Radiculopathy, lumbar region: Secondary | ICD-10-CM | POA: Diagnosis not present

## 2020-12-05 ENCOUNTER — Other Ambulatory Visit: Payer: Self-pay | Admitting: Internal Medicine

## 2020-12-07 DIAGNOSIS — Z6832 Body mass index (BMI) 32.0-32.9, adult: Secondary | ICD-10-CM | POA: Diagnosis not present

## 2020-12-07 DIAGNOSIS — M542 Cervicalgia: Secondary | ICD-10-CM | POA: Diagnosis not present

## 2020-12-07 DIAGNOSIS — M5440 Lumbago with sciatica, unspecified side: Secondary | ICD-10-CM | POA: Diagnosis not present

## 2020-12-07 DIAGNOSIS — M5416 Radiculopathy, lumbar region: Secondary | ICD-10-CM | POA: Diagnosis not present

## 2020-12-07 DIAGNOSIS — M5412 Radiculopathy, cervical region: Secondary | ICD-10-CM | POA: Diagnosis not present

## 2020-12-07 DIAGNOSIS — R030 Elevated blood-pressure reading, without diagnosis of hypertension: Secondary | ICD-10-CM | POA: Diagnosis not present

## 2020-12-22 DIAGNOSIS — M545 Low back pain, unspecified: Secondary | ICD-10-CM | POA: Diagnosis not present

## 2020-12-22 DIAGNOSIS — M5416 Radiculopathy, lumbar region: Secondary | ICD-10-CM | POA: Diagnosis not present

## 2020-12-23 DIAGNOSIS — Z6832 Body mass index (BMI) 32.0-32.9, adult: Secondary | ICD-10-CM | POA: Diagnosis not present

## 2020-12-23 DIAGNOSIS — M5440 Lumbago with sciatica, unspecified side: Secondary | ICD-10-CM | POA: Diagnosis not present

## 2020-12-23 DIAGNOSIS — R030 Elevated blood-pressure reading, without diagnosis of hypertension: Secondary | ICD-10-CM | POA: Diagnosis not present

## 2020-12-27 ENCOUNTER — Other Ambulatory Visit: Payer: Self-pay | Admitting: Neurosurgery

## 2021-01-20 ENCOUNTER — Other Ambulatory Visit: Payer: Self-pay | Admitting: Internal Medicine

## 2021-02-11 DIAGNOSIS — M65321 Trigger finger, right index finger: Secondary | ICD-10-CM | POA: Diagnosis not present

## 2021-02-16 NOTE — Progress Notes (Signed)
Surgical Instructions    Your procedure is scheduled on 02/21/21.  Report to [facility] Main Entrance "A" at 05:30 A.M., then check in with the Admitting office.  Call this number if you have problems the morning of surgery:  [phone]   If you have any questions prior to your surgery date call [phone]: Open Monday-Friday 8am-4pm    Remember:  Do not eat or drink after midnight the night before your surgery     Take these medicines the morning of surgery with A SIP OF WATER  acetaminophen (TYLENOL) if needed albuterol (VENTOLIN HFA) if needed benzonatate (TESSALON) if needed traMADol (ULTRAM) if needed   As of today, STOP taking any Aspirin (unless otherwise instructed by your surgeon) Aleve, Naproxen, Ibuprofen, Motrin, Advil, Goody's, BC's, all herbal medications, fish oil, and all vitamins.          Do not wear jewelry or makeup Do not wear lotions, powders, perfumes/colognes, or deodorant. Do not shave 48 hours prior to surgery.   Do not bring valuables to the hospital.  DO Not wear nail polish, gel polish, artificial nails, or any other type of covering on natural nails  including finger and toenails. If patients have artificial nails, gel coating, etc. that need to be removed by a nail salon please have this removed prior to surgery or surgery may need to be canceled/delayed if the surgeon/ anesthesia feels like the patient is unable to be adequately monitored.             [facility] is not responsible for any belongings or valuables.  Do NOT Smoke (Tobacco/Vaping) or drink Alcohol 24 hours prior to your procedure If you use a CPAP at night, you may bring all equipment for your overnight stay.   Contacts, glasses, dentures or bridgework may not be worn into surgery, please bring cases for these belongings   For patients admitted to the hospital, discharge time will be determined by your treatment team.   Patients discharged the day of surgery will not be  allowed to drive home, and someone needs to stay with them for 24 hours.  ONLY 1 SUPPORT PERSON MAY BE PRESENT WHILE YOU ARE IN SURGERY. IF YOU ARE TO BE ADMITTED ONCE YOU ARE IN YOUR ROOM YOU WILL BE ALLOWED TWO (2) VISITORS.  Minor children may have two parents present. Special consideration for safety and communication needs will be reviewed on a case by case basis.  Special instructions:    Oral Hygiene is also important to reduce your risk of infection.  Remember - BRUSH YOUR TEETH THE MORNING OF SURGERY WITH YOUR REGULAR TOOTHPASTE   [facility]- Preparing For Surgery  Before surgery, you can play an important role. Because skin is not sterile, your skin needs to be as free of germs as possible. You can reduce the number of germs on your skin by washing with CHG (chlorahexidine gluconate) Soap before surgery.  CHG is an antiseptic cleaner which kills germs and bonds with the skin to continue killing germs even after washing.     Please do not use if you have an allergy to CHG or antibacterial soaps. If your skin becomes reddened/irritated stop using the CHG.  Do not shave (including legs and underarms) for at least 48 hours prior to first CHG shower. It is OK to shave your face.  Please follow these instructions carefully.     Shower the NIGHT BEFORE SURGERY and the MORNING OF SURGERY with CHG Soap.  If you chose to wash your hair, wash your hair first as usual with your normal shampoo. After you shampoo, rinse your hair and body thoroughly to remove the shampoo.  Then [employer] and genitals (private parts) with your normal soap and rinse thoroughly to remove soap.  After that Use CHG Soap as you would any other liquid soap. You can apply CHG directly to the skin and wash gently with a scrungie or a clean washcloth.   Apply the CHG Soap to your body ONLY FROM THE NECK DOWN.  Do not use on open wounds or open sores. Avoid contact with your eyes, ears, mouth and genitals (private  parts). Wash Face and genitals (private parts)  with your normal soap.   Wash thoroughly, paying special attention to the area where your surgery will be performed.  Thoroughly rinse your body with warm water from the neck down.  DO NOT shower/wash with your normal soap after using and rinsing off the CHG Soap.  Pat yourself dry with a CLEAN TOWEL.  Wear CLEAN PAJAMAS to bed the night before surgery  Place CLEAN SHEETS on your bed the night before your surgery  DO NOT SLEEP WITH PETS.   Day of Surgery: Take a shower with CHG soap. Wear Clean/Comfortable clothing the morning of surgery Do not apply any deodorants/lotions.   Remember to brush your teeth WITH YOUR REGULAR TOOTHPASTE.   Please read over the following fact sheets that you were given.

## 2021-02-17 ENCOUNTER — Encounter (HOSPITAL_COMMUNITY): Payer: Self-pay

## 2021-02-17 ENCOUNTER — Encounter (HOSPITAL_COMMUNITY)
Admission: RE | Admit: 2021-02-17 | Discharge: 2021-02-17 | Disposition: A | Discharge status: Home / Self Care | Payer: Medicare Other | Source: Ambulatory Visit | Attending: Neurosurgery | Admitting: Neurosurgery

## 2021-02-17 ENCOUNTER — Other Ambulatory Visit: Payer: Self-pay

## 2021-02-17 DIAGNOSIS — Z20822 Contact with and (suspected) exposure to covid-19: Secondary | ICD-10-CM | POA: Insufficient documentation

## 2021-02-17 DIAGNOSIS — Z01812 Encounter for preprocedural laboratory examination: Secondary | ICD-10-CM | POA: Diagnosis not present

## 2021-02-17 LAB — SURGICAL PCR SCREEN
MRSA, PCR: NEGATIVE
Staphylococcus aureus: NEGATIVE

## 2021-02-17 LAB — TYPE AND SCREEN
ABO/RH(D): O POS
Antibody Screen: NEGATIVE

## 2021-02-17 LAB — CBC
HCT: 43 % (ref 36.0–46.0)
Hemoglobin: 13.4 g/dL (ref 12.0–15.0)
MCH: 26.2 pg (ref 26.0–34.0)
MCHC: 31.2 g/dL (ref 30.0–36.0)
MCV: 84.1 fL (ref 80.0–100.0)
Platelets: 348 10*3/uL (ref 150–400)
RBC: 5.11 MIL/uL (ref 3.87–5.11)
RDW: 15.2 % (ref 11.5–15.5)
WBC: 8.9 10*3/uL (ref 4.0–10.5)
nRBC: 0 % (ref 0.0–0.2)

## 2021-02-17 LAB — SARS CORONAVIRUS 2 (TAT 6-24 HRS): SARS Coronavirus 2: NEGATIVE

## 2021-02-17 NOTE — Progress Notes (Addendum)
PCP - Julie Chard, MD Cardiologist - denies  PPM/ICD - denies  Chest x-ray - 06/09/20 EKG - 06/10/20 Stress Test - denies ECHO - denies Cardiac Cath - denies  Sleep Study - denies  DM: denies   ERAS Protcol - No  COVID TEST- 02/17/21 In PAT  Anesthesia review: no  Patient denies shortness of breath, fever, cough and chest pain at PAT appointment. Pt states she's having "seasonal allergies." She states symptoms improved after allergy medication was taken. She understands if symptoms worsen that she is to contact the PCP and surgeon if needed.   All instructions explained to the patient, with a verbal understanding of the material. Patient agrees to go over the instructions while at home for a better understanding. The opportunity to ask questions was provided.

## 2021-02-20 NOTE — Anesthesia Preprocedure Evaluation (Addendum)
Anesthesia Evaluation  Patient identified by MRN, date of birth, ID band Patient awake    Reviewed: Allergy & Precautions, NPO status , Patient's Chart, lab work & pertinent test results  History of Anesthesia Complications (+) PONV and history of anesthetic complications  Airway Mallampati: II  TM Distance: >3 FB Neck ROM: Full    Dental  (+) Missing,    Pulmonary asthma , COPD, Current Smoker,    Pulmonary exam normal        Cardiovascular negative cardio ROS Normal cardiovascular exam     Neuro/Psych negative neurological ROS  negative psych ROS   GI/Hepatic Neg liver ROS, GERD  Medicated and Controlled,  Endo/Other  BMI 32  Renal/GU negative Renal ROS  negative genitourinary   Musculoskeletal  (+) Arthritis ,   Abdominal   Peds  Hematology negative hematology ROS (+)   Anesthesia Other Findings Day of surgery medications reviewed with patient.  Reproductive/Obstetrics negative OB ROS                            Anesthesia Physical Anesthesia Plan  ASA: 2  Anesthesia Plan: General   Post-op Pain Management:    Induction: Intravenous  PONV Risk Score and Plan: 4 or greater and Treatment may vary due to age or medical condition, Ondansetron, Dexamethasone, Midazolam and Propofol infusion  Airway Management Planned: Oral ETT  Additional Equipment: None  Intra-op Plan:   Post-operative Plan: Extubation in OR  Informed Consent: I have reviewed the patients History and Physical, chart, labs and discussed the procedure including the risks, benefits and alternatives for the proposed anesthesia with the patient or authorized representative who has indicated his/her understanding and acceptance.     Dental advisory given  Plan Discussed with: CRNA  Anesthesia Plan Comments:        Anesthesia Quick Evaluation

## 2021-02-21 ENCOUNTER — Other Ambulatory Visit: Payer: Self-pay | Admitting: Internal Medicine

## 2021-02-21 ENCOUNTER — Encounter (HOSPITAL_COMMUNITY)
Admission: RE | Disposition: A | Discharge status: Home / Self Care | Payer: Self-pay | Source: Home / Self Care | Attending: Neurosurgery

## 2021-02-21 ENCOUNTER — Inpatient Hospital Stay (HOSPITAL_COMMUNITY): Payer: Medicare Other

## 2021-02-21 ENCOUNTER — Inpatient Hospital Stay (HOSPITAL_COMMUNITY): Payer: Medicare Other | Admitting: Anesthesiology

## 2021-02-21 ENCOUNTER — Other Ambulatory Visit: Payer: Self-pay

## 2021-02-21 ENCOUNTER — Encounter (HOSPITAL_COMMUNITY): Payer: Self-pay | Admitting: Neurosurgery

## 2021-02-21 ENCOUNTER — Inpatient Hospital Stay (HOSPITAL_COMMUNITY)
Admission: RE | Admit: 2021-02-21 | Discharge: 2021-02-24 | DRG: 454 | Disposition: A | Discharge status: Home / Self Care | Payer: Medicare Other | Attending: Neurosurgery | Admitting: Neurosurgery

## 2021-02-21 DIAGNOSIS — M4326 Fusion of spine, lumbar region: Secondary | ICD-10-CM | POA: Diagnosis not present

## 2021-02-21 DIAGNOSIS — Z880 Allergy status to penicillin: Secondary | ICD-10-CM

## 2021-02-21 DIAGNOSIS — Z419 Encounter for procedure for purposes other than remedying health state, unspecified: Secondary | ICD-10-CM

## 2021-02-21 DIAGNOSIS — M5136 Other intervertebral disc degeneration, lumbar region: Secondary | ICD-10-CM | POA: Diagnosis present

## 2021-02-21 DIAGNOSIS — M4186 Other forms of scoliosis, lumbar region: Secondary | ICD-10-CM | POA: Diagnosis present

## 2021-02-21 DIAGNOSIS — M532X6 Spinal instabilities, lumbar region: Secondary | ICD-10-CM | POA: Diagnosis not present

## 2021-02-21 DIAGNOSIS — M62830 Muscle spasm of back: Secondary | ICD-10-CM | POA: Diagnosis not present

## 2021-02-21 DIAGNOSIS — Z888 Allergy status to other drugs, medicaments and biological substances status: Secondary | ICD-10-CM

## 2021-02-21 DIAGNOSIS — M48061 Spinal stenosis, lumbar region without neurogenic claudication: Secondary | ICD-10-CM | POA: Diagnosis present

## 2021-02-21 DIAGNOSIS — Z981 Arthrodesis status: Secondary | ICD-10-CM | POA: Diagnosis not present

## 2021-02-21 DIAGNOSIS — Y831 Surgical operation with implant of artificial internal device as the cause of abnormal reaction of the patient, or of later complication, without mention of misadventure at the time of the procedure: Secondary | ICD-10-CM | POA: Diagnosis not present

## 2021-02-21 DIAGNOSIS — F17210 Nicotine dependence, cigarettes, uncomplicated: Secondary | ICD-10-CM | POA: Diagnosis present

## 2021-02-21 DIAGNOSIS — Z882 Allergy status to sulfonamides status: Secondary | ICD-10-CM

## 2021-02-21 DIAGNOSIS — J449 Chronic obstructive pulmonary disease, unspecified: Secondary | ICD-10-CM | POA: Diagnosis present

## 2021-02-21 DIAGNOSIS — G9741 Accidental puncture or laceration of dura during a procedure: Secondary | ICD-10-CM | POA: Diagnosis not present

## 2021-02-21 SURGERY — POSTERIOR LUMBAR FUSION 1 WITH HARDWARE REMOVAL
Anesthesia: General | Site: Back

## 2021-02-21 MED ORDER — CYCLOBENZAPRINE HCL 10 MG PO TABS
10.0000 mg | ORAL_TABLET | Freq: Three times a day (TID) | ORAL | Status: DC | PRN
  Administered 2021-02-21: 10 mg via ORAL

## 2021-02-21 MED ORDER — MENTHOL 3 MG MT LOZG: 1.0000 | LOZENGE | OROMUCOSAL | Status: DC | PRN

## 2021-02-21 MED ORDER — ACETAMINOPHEN 500 MG PO TABS
1000.0000 mg | ORAL_TABLET | Freq: Once | ORAL | Status: AC
  Administered 2021-02-21: 1000 mg via ORAL
  Filled 2021-02-21: qty 2

## 2021-02-21 MED ORDER — S.S.S. TONIC PO LIQD: 1.0000 | ORAL | Status: DC

## 2021-02-21 MED ORDER — BENZONATATE 100 MG PO CAPS
200.0000 mg | ORAL_CAPSULE | Freq: Three times a day (TID) | ORAL | Status: DC | PRN
  Filled 2021-02-21: qty 2

## 2021-02-21 MED ORDER — LACTATED RINGERS IV SOLN: INTRAVENOUS | Status: DC

## 2021-02-21 MED ORDER — METHOCARBAMOL 1000 MG/10ML IJ SOLN
500.0000 mg | Freq: Four times a day (QID) | INTRAVENOUS | Status: DC | PRN
  Administered 2021-02-21: 500 mg via INTRAVENOUS
  Filled 2021-02-21: qty 500

## 2021-02-21 MED ORDER — PANTOPRAZOLE SODIUM 40 MG PO TBEC
40.0000 mg | DELAYED_RELEASE_TABLET | Freq: Every day | ORAL | Status: DC
  Administered 2021-02-21 – 2021-02-23 (×3): 40 mg via ORAL
  Filled 2021-02-21 (×3): qty 1

## 2021-02-21 MED ORDER — DEXAMETHASONE SODIUM PHOSPHATE 10 MG/ML IJ SOLN
INTRAMUSCULAR | Status: AC
  Filled 2021-02-21: qty 1

## 2021-02-21 MED ORDER — HYDROMORPHONE HCL 1 MG/ML IJ SOLN
0.5000 mg | INTRAMUSCULAR | Status: DC | PRN
  Administered 2021-02-21 – 2021-02-23 (×2): 0.5 mg via INTRAVENOUS
  Filled 2021-02-21 (×2): qty 0.5

## 2021-02-21 MED ORDER — CHLORHEXIDINE GLUCONATE CLOTH 2 % EX PADS: 6.0000 | MEDICATED_PAD | Freq: Once | CUTANEOUS | Status: DC

## 2021-02-21 MED ORDER — CHLORHEXIDINE GLUCONATE 0.12 % MT SOLN
15.0000 mL | Freq: Once | OROMUCOSAL | Status: AC
  Administered 2021-02-21: 15 mL via OROMUCOSAL
  Filled 2021-02-21: qty 15

## 2021-02-21 MED ORDER — ONDANSETRON HCL 4 MG/2ML IJ SOLN
4.0000 mg | Freq: Four times a day (QID) | INTRAMUSCULAR | Status: DC | PRN
  Administered 2021-02-21: 4 mg via INTRAVENOUS
  Filled 2021-02-21: qty 2

## 2021-02-21 MED ORDER — EPHEDRINE SULFATE-NACL 50-0.9 MG/10ML-% IV SOSY
PREFILLED_SYRINGE | INTRAVENOUS | Status: DC | PRN
  Administered 2021-02-21 (×2): 10 mg via INTRAVENOUS

## 2021-02-21 MED ORDER — OXYCODONE HCL 5 MG/5ML PO SOLN: 5.0000 mg | Freq: Once | ORAL | Status: DC | PRN

## 2021-02-21 MED ORDER — HEMOSTATIC AGENTS (NO CHARGE) OPTIME
TOPICAL | Status: DC | PRN
  Administered 2021-02-21: 1 via TOPICAL

## 2021-02-21 MED ORDER — 0.9 % SODIUM CHLORIDE (POUR BTL) OPTIME
TOPICAL | Status: DC | PRN
  Administered 2021-02-21: 1000 mL

## 2021-02-21 MED ORDER — ACETAMINOPHEN ER 650 MG PO TBCR: 1300.0000 mg | EXTENDED_RELEASE_TABLET | Freq: Every day | ORAL | Status: DC | PRN

## 2021-02-21 MED ORDER — ACETAMINOPHEN 650 MG RE SUPP: 650.0000 mg | RECTAL | Status: DC | PRN

## 2021-02-21 MED ORDER — PHENYLEPHRINE 40 MCG/ML (10ML) SYRINGE FOR IV PUSH (FOR BLOOD PRESSURE SUPPORT)
PREFILLED_SYRINGE | INTRAVENOUS | Status: AC
  Filled 2021-02-21: qty 10

## 2021-02-21 MED ORDER — BUPIVACAINE LIPOSOME 1.3 % IJ SUSP
INTRAMUSCULAR | Status: AC
  Filled 2021-02-21: qty 20

## 2021-02-21 MED ORDER — FENTANYL CITRATE (PF) 250 MCG/5ML IJ SOLN
INTRAMUSCULAR | Status: AC
  Filled 2021-02-21: qty 5

## 2021-02-21 MED ORDER — FENTANYL CITRATE (PF) 100 MCG/2ML IJ SOLN
INTRAMUSCULAR | Status: AC
  Filled 2021-02-21: qty 2

## 2021-02-21 MED ORDER — PHENYLEPHRINE HCL-NACL 10-0.9 MG/250ML-% IV SOLN
INTRAVENOUS | Status: DC | PRN
  Administered 2021-02-21: 20 ug/min via INTRAVENOUS

## 2021-02-21 MED ORDER — SUGAMMADEX SODIUM 200 MG/2ML IV SOLN
INTRAVENOUS | Status: DC | PRN
  Administered 2021-02-21: 300 mg via INTRAVENOUS

## 2021-02-21 MED ORDER — OXYCODONE HCL 5 MG PO TABS
10.0000 mg | ORAL_TABLET | ORAL | Status: DC | PRN
  Administered 2021-02-21 – 2021-02-24 (×15): 10 mg via ORAL
  Filled 2021-02-21 (×15): qty 2

## 2021-02-21 MED ORDER — OXYCODONE HCL 5 MG PO TABS: 5.0000 mg | ORAL_TABLET | Freq: Once | ORAL | Status: DC | PRN

## 2021-02-21 MED ORDER — PROPOFOL 10 MG/ML IV BOLUS
INTRAVENOUS | Status: DC | PRN
  Administered 2021-02-21: 150 mg via INTRAVENOUS

## 2021-02-21 MED ORDER — HYDROXYZINE HCL 50 MG/ML IM SOLN: 50.0000 mg | Freq: Four times a day (QID) | INTRAMUSCULAR | Status: DC | PRN

## 2021-02-21 MED ORDER — PROPOFOL 10 MG/ML IV BOLUS
INTRAVENOUS | Status: AC
  Filled 2021-02-21: qty 20

## 2021-02-21 MED ORDER — VANCOMYCIN HCL IN DEXTROSE 1-5 GM/200ML-% IV SOLN
1000.0000 mg | INTRAVENOUS | Status: AC
  Administered 2021-02-21: 1000 mg via INTRAVENOUS
  Filled 2021-02-21: qty 200

## 2021-02-21 MED ORDER — LIDOCAINE 2% (20 MG/ML) 5 ML SYRINGE
INTRAMUSCULAR | Status: DC | PRN
  Administered 2021-02-21: 100 mg via INTRAVENOUS

## 2021-02-21 MED ORDER — SODIUM CHLORIDE 0.9% FLUSH
3.0000 mL | Freq: Two times a day (BID) | INTRAVENOUS | Status: DC
  Administered 2021-02-22 – 2021-02-23 (×4): 3 mL via INTRAVENOUS

## 2021-02-21 MED ORDER — ORAL CARE MOUTH RINSE: 15.0000 mL | Freq: Once | OROMUCOSAL | Status: AC

## 2021-02-21 MED ORDER — ALUM & MAG HYDROXIDE-SIMETH 200-200-20 MG/5ML PO SUSP: 30.0000 mL | Freq: Four times a day (QID) | ORAL | Status: DC | PRN

## 2021-02-21 MED ORDER — ROCURONIUM BROMIDE 10 MG/ML (PF) SYRINGE
PREFILLED_SYRINGE | INTRAVENOUS | Status: AC
  Filled 2021-02-21: qty 10

## 2021-02-21 MED ORDER — GLYCOPYRROLATE PF 0.2 MG/ML IJ SOSY
PREFILLED_SYRINGE | INTRAMUSCULAR | Status: DC | PRN
  Administered 2021-02-21: .2 mg via INTRAVENOUS

## 2021-02-21 MED ORDER — CYCLOBENZAPRINE HCL 10 MG PO TABS
ORAL_TABLET | ORAL | Status: AC
  Filled 2021-02-21: qty 1

## 2021-02-21 MED ORDER — BUPIVACAINE LIPOSOME 1.3 % IJ SUSP
INTRAMUSCULAR | Status: DC | PRN
  Administered 2021-02-21: 20 mL

## 2021-02-21 MED ORDER — PANTOPRAZOLE SODIUM 40 MG IV SOLR: 40.0000 mg | Freq: Every day | INTRAVENOUS | Status: DC

## 2021-02-21 MED ORDER — SODIUM CHLORIDE 0.9% FLUSH: 3.0000 mL | INTRAVENOUS | Status: DC | PRN

## 2021-02-21 MED ORDER — LIDOCAINE 2% (20 MG/ML) 5 ML SYRINGE
INTRAMUSCULAR | Status: AC
  Filled 2021-02-21: qty 5

## 2021-02-21 MED ORDER — TRAMADOL HCL 50 MG PO TABS
50.0000 mg | ORAL_TABLET | Freq: Four times a day (QID) | ORAL | Status: DC | PRN
  Administered 2021-02-22: 50 mg via ORAL
  Filled 2021-02-21: qty 1

## 2021-02-21 MED ORDER — ONDANSETRON HCL 4 MG/2ML IJ SOLN
INTRAMUSCULAR | Status: AC
  Filled 2021-02-21: qty 2

## 2021-02-21 MED ORDER — VITAMIN D (ERGOCALCIFEROL) 1.25 MG (50000 UNIT) PO CAPS
50000.0000 [IU] | ORAL_CAPSULE | ORAL | Status: DC
  Administered 2021-02-24: 50000 [IU] via ORAL
  Filled 2021-02-21 (×2): qty 1

## 2021-02-21 MED ORDER — PHENOL 1.4 % MT LIQD: 1.0000 | OROMUCOSAL | Status: DC | PRN

## 2021-02-21 MED ORDER — THROMBIN 20000 UNITS EX SOLR
CUTANEOUS | Status: AC
  Filled 2021-02-21: qty 20000

## 2021-02-21 MED ORDER — SODIUM CHLORIDE 0.9 % IV SOLN
250.0000 mL | INTRAVENOUS | Status: DC
  Administered 2021-02-21: 250 mL via INTRAVENOUS

## 2021-02-21 MED ORDER — LIDOCAINE-EPINEPHRINE 1 %-1:100000 IJ SOLN
INTRAMUSCULAR | Status: AC
  Filled 2021-02-21: qty 1

## 2021-02-21 MED ORDER — DIAZEPAM 5 MG PO TABS
5.0000 mg | ORAL_TABLET | Freq: Four times a day (QID) | ORAL | Status: DC | PRN
  Administered 2021-02-21 – 2021-02-22 (×3): 5 mg via ORAL
  Filled 2021-02-21 (×3): qty 1

## 2021-02-21 MED ORDER — METHOCARBAMOL 500 MG PO TABS
500.0000 mg | ORAL_TABLET | Freq: Four times a day (QID) | ORAL | Status: DC | PRN
  Administered 2021-02-21 – 2021-02-22 (×2): 500 mg via ORAL
  Filled 2021-02-21 (×2): qty 1

## 2021-02-21 MED ORDER — ACETAMINOPHEN 325 MG PO TABS
650.0000 mg | ORAL_TABLET | ORAL | Status: DC | PRN
  Administered 2021-02-23 – 2021-02-24 (×5): 650 mg via ORAL
  Filled 2021-02-21 (×5): qty 2

## 2021-02-21 MED ORDER — CEFAZOLIN SODIUM-DEXTROSE 2-4 GM/100ML-% IV SOLN
2.0000 g | Freq: Three times a day (TID) | INTRAVENOUS | Status: AC
  Administered 2021-02-21 (×2): 2 g via INTRAVENOUS
  Filled 2021-02-21 (×2): qty 100

## 2021-02-21 MED ORDER — ALBUTEROL SULFATE HFA 108 (90 BASE) MCG/ACT IN AERS: 1.0000 | INHALATION_SPRAY | RESPIRATORY_TRACT | Status: DC | PRN

## 2021-02-21 MED ORDER — LIDOCAINE-EPINEPHRINE 1 %-1:100000 IJ SOLN
INTRAMUSCULAR | Status: DC | PRN
  Administered 2021-02-21: 10 mL

## 2021-02-21 MED ORDER — OMEGA 3 1000 MG PO CAPS: ORAL_CAPSULE | Freq: Two times a day (BID) | ORAL | Status: DC

## 2021-02-21 MED ORDER — PROMETHAZINE HCL 25 MG/ML IJ SOLN
6.2500 mg | INTRAMUSCULAR | Status: DC | PRN
  Administered 2021-02-21: 6.25 mg via INTRAVENOUS

## 2021-02-21 MED ORDER — ALBUMIN HUMAN 5 % IV SOLN: INTRAVENOUS | Status: DC | PRN

## 2021-02-21 MED ORDER — ROCURONIUM BROMIDE 10 MG/ML (PF) SYRINGE
PREFILLED_SYRINGE | INTRAVENOUS | Status: DC | PRN
  Administered 2021-02-21: 10 mg via INTRAVENOUS
  Administered 2021-02-21: 40 mg via INTRAVENOUS
  Administered 2021-02-21: 60 mg via INTRAVENOUS
  Administered 2021-02-21: 10 mg via INTRAVENOUS

## 2021-02-21 MED ORDER — THROMBIN 5000 UNITS EX SOLR
CUTANEOUS | Status: AC
  Filled 2021-02-21: qty 5000

## 2021-02-21 MED ORDER — FENTANYL CITRATE (PF) 100 MCG/2ML IJ SOLN
25.0000 ug | INTRAMUSCULAR | Status: DC | PRN
  Administered 2021-02-21 (×3): 25 ug via INTRAVENOUS
  Administered 2021-02-21: 50 ug via INTRAVENOUS

## 2021-02-21 MED ORDER — ONDANSETRON HCL 4 MG/2ML IJ SOLN
INTRAMUSCULAR | Status: DC | PRN
  Administered 2021-02-21: 4 mg via INTRAVENOUS

## 2021-02-21 MED ORDER — PHENYLEPHRINE 40 MCG/ML (10ML) SYRINGE FOR IV PUSH (FOR BLOOD PRESSURE SUPPORT)
PREFILLED_SYRINGE | INTRAVENOUS | Status: DC | PRN
  Administered 2021-02-21: 40 ug via INTRAVENOUS
  Administered 2021-02-21: 120 ug via INTRAVENOUS

## 2021-02-21 MED ORDER — BUPIVACAINE HCL (PF) 0.25 % IJ SOLN
INTRAMUSCULAR | Status: AC
  Filled 2021-02-21: qty 30

## 2021-02-21 MED ORDER — PROMETHAZINE HCL 25 MG/ML IJ SOLN
INTRAMUSCULAR | Status: AC
  Filled 2021-02-21: qty 1

## 2021-02-21 MED ORDER — DEXAMETHASONE SODIUM PHOSPHATE 10 MG/ML IJ SOLN
10.0000 mg | Freq: Four times a day (QID) | INTRAMUSCULAR | Status: DC
  Administered 2021-02-21 – 2021-02-24 (×11): 10 mg via INTRAVENOUS
  Filled 2021-02-21 (×11): qty 1

## 2021-02-21 MED ORDER — ONDANSETRON HCL 4 MG PO TABS: 4.0000 mg | ORAL_TABLET | Freq: Four times a day (QID) | ORAL | Status: DC | PRN

## 2021-02-21 MED ORDER — MIDAZOLAM HCL 5 MG/5ML IJ SOLN
INTRAMUSCULAR | Status: DC | PRN
  Administered 2021-02-21: 1 mg via INTRAVENOUS

## 2021-02-21 MED ORDER — THROMBIN 20000 UNITS EX SOLR
CUTANEOUS | Status: DC | PRN
  Administered 2021-02-21: 20 mL via TOPICAL

## 2021-02-21 MED ORDER — PHENYLEPHRINE-DM-GG-APAP 5-10-200-325 MG PO TABS: ORAL_TABLET | Freq: Three times a day (TID) | ORAL | Status: DC | PRN

## 2021-02-21 MED ORDER — KETOROLAC TROMETHAMINE 15 MG/ML IJ SOLN
15.0000 mg | Freq: Four times a day (QID) | INTRAMUSCULAR | Status: DC
  Administered 2021-02-21 – 2021-02-22 (×2): 15 mg via INTRAVENOUS
  Filled 2021-02-21 (×2): qty 1

## 2021-02-21 MED ORDER — DEXAMETHASONE SODIUM PHOSPHATE 10 MG/ML IJ SOLN
INTRAMUSCULAR | Status: DC | PRN
  Administered 2021-02-21: 10 mg via INTRAVENOUS

## 2021-02-21 MED ORDER — FENTANYL CITRATE (PF) 250 MCG/5ML IJ SOLN
INTRAMUSCULAR | Status: DC | PRN
  Administered 2021-02-21: 25 ug via INTRAVENOUS
  Administered 2021-02-21: 50 ug via INTRAVENOUS
  Administered 2021-02-21: 25 ug via INTRAVENOUS
  Administered 2021-02-21: 50 ug via INTRAVENOUS
  Administered 2021-02-21: 100 ug via INTRAVENOUS

## 2021-02-21 MED ORDER — DEXAMETHASONE SODIUM PHOSPHATE 10 MG/ML IJ SOLN
10.0000 mg | Freq: Once | INTRAMUSCULAR | Status: DC
  Filled 2021-02-21: qty 1

## 2021-02-21 MED ORDER — MIDAZOLAM HCL 2 MG/2ML IJ SOLN
INTRAMUSCULAR | Status: AC
  Filled 2021-02-21: qty 2

## 2021-02-21 SURGICAL SUPPLY — 69 items
BAG COUNTER SPONGE SURGICOUNT (BAG) ×4 IMPLANT
BASKET BONE COLLECTION (BASKET) ×2 IMPLANT
BENZOIN TINCTURE PRP APPL 2/3 (GAUZE/BANDAGES/DRESSINGS) ×2 IMPLANT
BLADE CLIPPER SURG (BLADE) IMPLANT
BLADE SURG 11 STRL SS (BLADE) ×2 IMPLANT
BONE VIVIGEN FORMABLE 5.4CC (Bone Implant) ×2 IMPLANT
BUR CUTTER 7.0 ROUND (BURR) ×2 IMPLANT
BUR MATCHSTICK NEURO 3.0 LAGG (BURR) ×2 IMPLANT
CANISTER SUCT 3000ML PPV (MISCELLANEOUS) ×2 IMPLANT
CAP REVERE LOCKING (Cap) ×12 IMPLANT
CARTRIDGE OIL MAESTRO DRILL (MISCELLANEOUS) ×1 IMPLANT
CNTNR URN SCR LID CUP LEK RST (MISCELLANEOUS) ×1 IMPLANT
CONT SPEC 4OZ STRL OR WHT (MISCELLANEOUS) ×2
COVER BACK TABLE 60X90IN (DRAPES) ×2 IMPLANT
DECANTER SPIKE VIAL GLASS SM (MISCELLANEOUS) ×2 IMPLANT
DERMABOND ADVANCED (GAUZE/BANDAGES/DRESSINGS) ×1
DERMABOND ADVANCED .7 DNX12 (GAUZE/BANDAGES/DRESSINGS) ×1 IMPLANT
DIFFUSER DRILL AIR PNEUMATIC (MISCELLANEOUS) ×2 IMPLANT
DRAPE C-ARM 42X72 X-RAY (DRAPES) ×4 IMPLANT
DRAPE C-ARMOR (DRAPES) IMPLANT
DRAPE HALF SHEET 40X57 (DRAPES) ×2 IMPLANT
DRAPE LAPAROTOMY 100X72X124 (DRAPES) ×2 IMPLANT
DRAPE SURG 17X23 STRL (DRAPES) ×2 IMPLANT
DRSG OPSITE 4X5.5 SM (GAUZE/BANDAGES/DRESSINGS) IMPLANT
DRSG OPSITE POSTOP 4X6 (GAUZE/BANDAGES/DRESSINGS) IMPLANT
DRSG OPSITE POSTOP 4X8 (GAUZE/BANDAGES/DRESSINGS) ×2 IMPLANT
DURAPREP 26ML APPLICATOR (WOUND CARE) ×2 IMPLANT
ELECT REM PT RETURN 9FT ADLT (ELECTROSURGICAL) ×2
ELECTRODE REM PT RTRN 9FT ADLT (ELECTROSURGICAL) ×1 IMPLANT
EVACUATOR 3/16  PVC DRAIN (DRAIN)
EVACUATOR 3/16 PVC DRAIN (DRAIN) IMPLANT
GAUZE 4X4 16PLY ~~LOC~~+RFID DBL (SPONGE) ×2 IMPLANT
GAUZE SPONGE 4X4 12PLY STRL (GAUZE/BANDAGES/DRESSINGS) IMPLANT
GLOVE EXAM NITRILE XL STR (GLOVE) IMPLANT
GLOVE SURG ENC MOIS LTX SZ7 (GLOVE) IMPLANT
GLOVE SURG ENC MOIS LTX SZ8 (GLOVE) ×4 IMPLANT
GLOVE SURG UNDER LTX SZ8.5 (GLOVE) ×4 IMPLANT
GLOVE SURG UNDER POLY LF SZ7 (GLOVE) IMPLANT
GOWN STRL REUS W/ TWL LRG LVL3 (GOWN DISPOSABLE) ×3 IMPLANT
GOWN STRL REUS W/ TWL XL LVL3 (GOWN DISPOSABLE) ×2 IMPLANT
GOWN STRL REUS W/TWL 2XL LVL3 (GOWN DISPOSABLE) IMPLANT
GOWN STRL REUS W/TWL LRG LVL3 (GOWN DISPOSABLE) ×6
GOWN STRL REUS W/TWL XL LVL3 (GOWN DISPOSABLE) ×4
GRAFT BONE PROTEIOS LRG 5CC (Orthopedic Implant) ×2 IMPLANT
KIT BASIN OR (CUSTOM PROCEDURE TRAY) ×2 IMPLANT
KIT TURNOVER KIT B (KITS) ×2 IMPLANT
MILL MEDIUM DISP (BLADE) ×2 IMPLANT
NEEDLE HYPO 21X1.5 SAFETY (NEEDLE) ×2 IMPLANT
NEEDLE HYPO 25X1 1.5 SAFETY (NEEDLE) ×2 IMPLANT
NS IRRIG 1000ML POUR BTL (IV SOLUTION) ×2 IMPLANT
OIL CARTRIDGE MAESTRO DRILL (MISCELLANEOUS) ×2
PACK LAMINECTOMY NEURO (CUSTOM PROCEDURE TRAY) ×2 IMPLANT
PAD ARMBOARD 7.5X6 YLW CONV (MISCELLANEOUS) ×6 IMPLANT
ROD REVERE 6.35 CURVED 70MM (Rod) ×4 IMPLANT
SCREW REVERE 6.35 6.5X40MM (Screw) ×4 IMPLANT
SEALANT ADHERUS EXTEND TIP (MISCELLANEOUS) ×2 IMPLANT
SPACER SUSTAIN TI 9X22X12 15D (Spacer) ×4 IMPLANT
SPONGE SURGIFOAM ABS GEL 100 (HEMOSTASIS) ×2 IMPLANT
SPONGE T-LAP 4X18 ~~LOC~~+RFID (SPONGE) ×4 IMPLANT
STRIP CLOSURE SKIN 1/2X4 (GAUZE/BANDAGES/DRESSINGS) ×2 IMPLANT
SUT VIC AB 0 CT1 18XCR BRD8 (SUTURE) ×2 IMPLANT
SUT VIC AB 0 CT1 8-18 (SUTURE) ×4
SUT VIC AB 2-0 CT1 18 (SUTURE) ×2 IMPLANT
SUT VIC AB 4-0 PS2 27 (SUTURE) ×2 IMPLANT
SYR 20ML LL LF (SYRINGE) IMPLANT
TOWEL GREEN STERILE (TOWEL DISPOSABLE) ×2 IMPLANT
TOWEL GREEN STERILE FF (TOWEL DISPOSABLE) ×2 IMPLANT
TRAY FOLEY MTR SLVR 16FR STAT (SET/KITS/TRAYS/PACK) ×2 IMPLANT
WATER STERILE IRR 1000ML POUR (IV SOLUTION) ×2 IMPLANT

## 2021-02-21 NOTE — Progress Notes (Signed)
Patient ID: Julie Obrien, female   DOB: Feb 09, 1951, 70 y.o.   MRN: [id number] Patient seen on postop rounds severe back pain with some pain in her buttocks denies any new numbness and tingling her legs or feet strength is 5 out of 5.  We will start the patient on IV Decadron as well as keep her on flat bedrest tonight she did have small inadvertent dural tear that was not leaking spinal fluid, surgery but could be that this could be contributing to some of the muscle spasms or leave a flat bedrest overnight continue with pain medication muscle relaxers and initiate the IV Decadron

## 2021-02-21 NOTE — Transfer of Care (Signed)
Immediate Anesthesia Transfer of Care Note  Patient: Julie Obrien  Procedure(s) Performed: Posterior Lumbar Interbody Fusion  - Lumbar one-Lumbar two extension with removal of hardware Lumbar two-Lumbar four - Posterior Lateral and Interbody fusion (Back)  Patient Location: PACU  Anesthesia Type:General  Level of Consciousness: drowsy  Airway & Oxygen Therapy: Patient Spontanous Breathing and Patient connected to face mask oxygen  Post-op Assessment: Report given to RN and Post -op Vital signs reviewed and stable  Post vital signs: Reviewed and stable  Last Vitals:  Vitals Value Taken Time  BP 120/68 02/21/21 1144  Temp    Pulse 85 02/21/21 1146  Resp 22 02/21/21 1146  SpO2 96 % 02/21/21 1146  Vitals shown include unvalidated device data.  Last Pain:  Vitals:   02/21/21 0621  TempSrc:   PainSc: 4          Complications: No notable events documented.

## 2021-02-21 NOTE — H&P (Signed)
Julie Obrien is an 70 y.o. female.   Chief Complaint: Back and right hip and leg pain HPI: 70 year old female longstanding issues with her back previously undergone an L2-S1 fusion over successive operations presents now with progressive worsening back and right hip and leg pain.  Work-up has revealed segmental degeneration at L1 and L2 above her previous fusion.  Due to her progression of clinical syndrome imaging findings and failed conservative treatment I recommended reexploration fusion removal of hardware and decompressive laminectomy and interbody fusion L1-L2.  I extensively gone over the risks and benefits of that procedure with her as well as perioperative course expectations of outcome and alternatives of surgery and she understands and agrees to proceed forward.  Past Medical History:  Diagnosis Date   Arthritis    hnp, low back , knees - " all OA"   Complication of anesthesia    nausea one surgery more recent   History of kidney stones    Pneumonia    Feb.- Mar. 2017, not hospitalized, but pending surgery was cancelled ,since then, pt. seen by PCP, told that it was cleared     PONV (postoperative nausea and vomiting)     Past Surgical History:  Procedure Laterality Date   BACK SURGERY  08   CERVICAL DISCECTOMY  09   CESAREAN SECTION     x4   CYST EXCISION     vocal cords?   CYSTO  99   stone   HARDWARE REMOVAL N/A 06/21/2015   Procedure: Exploration of Fusion and removal of hardware right thoracic level one;  Surgeon: Kary Kos, MD;  Location: [place];  Service: Neurosurgery;  Laterality: N/A;   POSTERIOR CERVICAL FUSION/FORAMINOTOMY N/A 05/31/2015   Procedure: Decompression and Laminectomy Cervical three-four  Cervical six-seven  Cervical seven-thoracic-one Posterior cervical fusion with lateral mass fixation;  Surgeon: Kary Kos, MD;  Location: [place] [facility];  Service: Neurosurgery;  Laterality: N/A;   TONSILLECTOMY     TUBAL LIGATION      Family History   Problem Relation Age of Onset   Dementia Mother    Cancer Mother    Cancer Sister    Cancer Maternal Aunt    Social History:  reports that she has been smoking cigarettes. She has a 8.00 pack-year smoking history. She has never used smokeless tobacco. She reports that she does not drink alcohol and does not use drugs.  Allergies:  Allergies  Allergen Reactions   Nisoldipine Hives   Augmentin [Amoxicillin-Pot Clavulanate] Nausea And Vomiting    "Tore up my stomach" (verified this with her daughter, a physician)   Sulfa Antibiotics Hives    Medications Prior to Admission  Medication Sig Dispense Refill   acetaminophen (TYLENOL) 650 MG CR tablet Take 1,950 mg by mouth daily as needed for pain.     albuterol (VENTOLIN HFA) 108 (90 Base) MCG/ACT inhaler Inhale 1-2 puffs into the lungs every 4 (four) hours as needed for wheezing or shortness of breath. 18 g 11   benzonatate (TESSALON) 200 MG capsule Take 200 mg by mouth 3 (three) times daily as needed for cough.     Iron-Vitamins (S.S.S. TONIC) LIQD Take 1 Dose by mouth 3 (three) times a week.     Omega-3 Fatty Acids (OMEGA 3 PO) Take 1 capsule by mouth 2 (two) times daily after a meal. Omega XL     Phenylephrine-DM-GG-APAP (TYLENOL COLD/FLU SEVERE PO) Take 2 capsules by mouth 3 (three) times daily as needed (cold symptoms).  traMADol (ULTRAM) 50 MG tablet Take 50 mg by mouth every 6 (six) hours as needed for moderate pain or severe pain.      Vitamin D, Ergocalciferol, (DRISDOL) 1.25 MG (50000 UNIT) CAPS capsule TAKE 1 CAPSULE (50,000 UNITS TOTAL) BY MOUTH 2 (TWO) TIMES A WEEK. TAKE ON TUES & FRI 12 capsule 0   dexlansoprazole (DEXILANT) 60 MG capsule Take 1 capsule (60 mg total) by mouth daily. (Patient not taking: No sig reported) 30 capsule 3    No results found for this or any previous visit (from the past 48 hour(s)). No results found.  Review of Systems  Musculoskeletal:  Positive for back pain.  Neurological:  Positive for  weakness and numbness.   Blood pressure 123/66, pulse 71, temperature 98.7 F (37.1 C), temperature source Oral, resp. rate 18, height 5\' 3"  (1.6 m), weight 83 kg, SpO2 95 %. Physical Exam HENT:     Head: Normocephalic.     Nose: Nose normal.     Mouth/Throat:     Mouth: Mucous membranes are moist.  Eyes:     Pupils: Pupils are equal, round, and reactive to light.  Cardiovascular:     Rate and Rhythm: Normal rate.  Pulmonary:     Effort: Pulmonary effort is normal.  Abdominal:     General: Abdomen is flat.  Musculoskeletal:        General: Normal range of motion.  Neurological:     Mental Status: She is alert.     Comments: Patient is awake alert strength is 5 out of 5 iliopsoas, quads, hamstrings, gastroc, and tibialis, and EHL.     Assessment/Plan 70 year old female presents for decompressive laminectomy interbody fusion L1-L2 with exposure fusion move of hardware L2-L4.  Elaina Hoops, MD 02/21/2021, 7:25 AM

## 2021-02-21 NOTE — Progress Notes (Signed)
Orthopedic Tech Progress Note Patient Details:  Julie Obrien Aug 05, 1951 [id number]  Patient has brace  Patient ID: Julie Obrien, female   DOB: 07/23/1951, 70 y.o.   MRN: [id number]  Julie Obrien 02/21/2021, 3:11 PM

## 2021-02-21 NOTE — Op Note (Signed)
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  Posterior lumbar interbody fusion L1-L2 utilizing the globus titanium insert and rotate cages packed with locally harvested autograft mixed with vivigen and Proteus  3.  Pedicle screw fixation utilizing the globus Revere 6.35 pedicle screw system from L1-L3  4.  Posterior lateral arthrodesis L1 L3 utilizing the locally harvested autograft mixed with vivigen periosteal  5.  Exploration fusion move of hardware with disconnecting the rods and removal of bilateral L4 Legacy pedicle screws  Surgeon: Dominica Severin Carline Dura  Assistant: Nash Shearer  Anesthesia: General  EBL: Minimal  Complications: Inadvertent dural tear  HPI: 70 year old female longstanding issues with her back previous L2-L4 fusion developed gross worsening back bilateral hip and leg pain worse on the right with severe degenerative disc disease lumbar spinal stenosis degenerative scoliosis at L1-L2.  Due to patient failed conservative treatment imaging findings and progression of clinical syndrome I recommended reexploration fusion move of hardware and posterior lumbar interbody fusion L1-L2.  I extensively reviewed the risks and benefits of the operation with her as well as perioperative course expectations of outcome and alternatives of surgery and she understood and agreed to proceed forward.  Operative procedure: Patient was brought to the OR was induced under general anesthesia positioned prone on the Wilson frame her back was prepped and draped in routine sterile fashion.  Roll incision was opened up and extended slightly cephalad scar tissue was dissected free and subperiosteal dissection was carried lamina of L1 and  inferior T12 and then identified the hardware exposed the hardware down L4.  Fusion did appear to be solid I disconnected the knots remove the rods removed bilateral L4 Medtronic Legacy pedicle screws expose the TP at L1.  I then remove the spinous process at L1 performed complete central decompression with complete medial facetectomies and radical foraminotomies of the L2 and L1 nerve roots.  There was extensive scar around the medial aspect of the pedicle at L2 causing a lot of stenosis of the L2 nerve root all the scar was freed up the L2 nerve roots were decompressed with extensive unroofing of the foramen extensive under biting of the superior tickling facet allowed indication lateral aspect the disc base.  The disc base was incised although was noted markedly collapsed utilizing fluoroscopy and then sequential dilation this space was opened up during this medicine had some ligamentotaxis anteriorly as we did get some fishmouthing of the anterior margin of the disc base at that level.  We sized up a 9 to 1215 degree implant and felt this was the appropriate size for the dissipates after we had opened it up prepared the endplates and inserted both cages with distractors in place.  Prior to insertion of the second cage and extensive mount of autograft mix was packed centrally all the foramen at this point were reinspected to confirm patency no migration of graft material I then placed 2 L1 pedicle screws under fluoroscopy we then extensively dissected out and expose the TPs from L1-L3 I decorticated them aggressively and packed an extensive mount of autograft mixed posterior laterally from L1-L3.  Then sized up the rods anchored everything in place I did compress the left side of the L1-2 interspace but not the right the right was more skin is significantly collapsed and due to  sizing of the implants the left-sided implant was slightly loose prior to compression however with compression it anchored the implant in  place.  I was careful not to over compress secondary to the fishmouthing of the anterior disc base but I did feel a small amount of compression was necessary to further secure the implants.  While we were exploring the foramina I did notice a very small inadvertent dural tear in the axilla of the L1 nerve root on the left side packed this with Gelfoam sprayed some mild fibrin glue product around it leads more Gelfoam down some more fibrin glue made meticulous hemostasis and closed the wound tightly in layers with Vicryl and a running 4 subcuticular Dermabond benzoin Steri-Strips and a sterile dressing was applied patient recovery in stable condition.  At the end the case all needle count sponge counts were correct.

## 2021-02-21 NOTE — Anesthesia Procedure Notes (Addendum)
Procedure Name: Intubation Date/Time: 02/21/2021 7:36 AM Performed by: Gaylene Brooks, CRNA Pre-anesthesia Checklist: Patient identified, Emergency Drugs available, Suction available and Patient being monitored Patient Re-evaluated:Patient Re-evaluated prior to induction Oxygen Delivery Method: Circle system utilized Preoxygenation: Pre-oxygenation with 100% oxygen Induction Type: IV induction Ventilation: Mask ventilation without difficulty Laryngoscope Size: Mac and 3 Grade View: Grade I Tube type: Oral Tube size: 7.0 mm Number of attempts: 1 Airway Equipment and Method: Stylet and Oral airway Placement Confirmation: ETT inserted through vocal cords under direct vision, positive ETCO2 and breath sounds checked- equal and bilateral Secured at: 21 cm Tube secured with: Tape Dental Injury: Teeth and Oropharynx as per pre-operative assessment

## 2021-02-21 NOTE — Anesthesia Postprocedure Evaluation (Signed)
Anesthesia Post Note  Patient: Julie Obrien  Procedure(s) Performed: Posterior Lumbar Interbody Fusion  - Lumbar one-Lumbar two extension with removal of hardware Lumbar two-Lumbar four - Posterior Lateral and Interbody fusion (Back)     Patient location during evaluation: PACU Anesthesia Type: General Level of consciousness: awake and alert Pain management: pain level controlled Vital Signs Assessment: post-procedure vital signs reviewed and stable Respiratory status: spontaneous breathing, nonlabored ventilation and respiratory function stable Cardiovascular status: blood pressure returned to baseline Postop Assessment: no apparent nausea or vomiting Anesthetic complications: no   No notable events documented.  Last Vitals:  Vitals:   02/21/21 1410 02/21/21 1425  BP: (!) 113/55 (!) 114/53  Pulse: 69 68  Resp: 20 (!) 21  Temp:    SpO2: 99% 98%    Last Pain:  Vitals:   02/21/21 1425  TempSrc:   PainSc: Asleep    LLE Motor Response: Purposeful movement (02/21/21 1425) LLE Sensation: Full sensation (02/21/21 1425) RLE Motor Response: Purposeful movement (02/21/21 1425) RLE Sensation: Full sensation (02/21/21 1425)      Brennan Bailey

## 2021-02-22 MED ORDER — METHOCARBAMOL 750 MG PO TABS
750.0000 mg | ORAL_TABLET | Freq: Four times a day (QID) | ORAL | Status: DC | PRN
  Administered 2021-02-22 – 2021-02-24 (×8): 750 mg via ORAL
  Filled 2021-02-22 (×7): qty 1

## 2021-02-22 NOTE — Evaluation (Signed)
Physical Therapy Evaluation Patient Details Name: Julie Obrien MRN: [id number] DOB: 03-14-1951 Today's Date: 02/22/2021   History of Present Illness  70 yo female s/p decompressive laminectomy interbody fusion L1-L2 on 02/22/21.  PMH significant for but not lmited to: OA, multiple lumbar surgeries,  Clinical Impression  Patient is s/p above surgery resulting in the deficits listed below (see PT Problem List).  Patient will benefit from skilled PT to increase their independence and safety with mobility (while adhering to their precautions) to allow discharge to home with family assisting.  Pt limited today by pain and muscle spasms. No reports of headache after ambulating.  Encouraged pt to mobilize more with nursing today and PT will return tomorrow.       Follow Up Recommendations Home health PT;Supervision for mobility/OOB    Equipment Recommendations  None recommended by PT    Recommendations for Other Services OT consult     Precautions / Restrictions Precautions Precautions: Back;Fall Precaution Booklet Issued: Yes (comment) Required Braces or Orthoses: Spinal Brace Spinal Brace: Lumbar corset;Applied in sitting position Restrictions Weight Bearing Restrictions: No      Mobility  Bed Mobility Overal bed mobility: Needs Assistance Bed Mobility: Rolling;Sidelying to Sit Rolling: Supervision (used rail) Sidelying to sit: Min assist;HOB elevated (used rail)            Transfers Overall transfer level: Needs assistance Equipment used: Rolling walker (2 wheeled) Transfers: Sit to/from Stand Sit to Stand: Min assist         General transfer comment: assist to power up  Ambulation/Gait Ambulation/Gait assistance: Min assist Gait Distance (Feet): 8 Feet Assistive device: Rolling walker (2 wheeled) Gait Pattern/deviations: Step-to pattern;Decreased stride length Gait velocity: decreased   General Gait Details: pt with lots of muscle spasms in r buttock/hip  area with gait.  Stairs            Wheelchair Mobility    Modified Rankin (Stroke Patients Only)       Balance Overall balance assessment: Needs assistance                                           Pertinent Vitals/Pain Pain Assessment: 0-10 Pain Score: 7  (7 with activity, 5/10 at rest) Pain Location: buttocks and low back.  R side worse than left Pain Descriptors / Indicators: Cramping;Spasm Pain Intervention(s): Limited activity within patient's tolerance;Monitored during session;Premedicated before session;Heat applied    Home Living Family/patient expects to be discharged to:: Private residence Living Arrangements: Spouse/significant other Available Help at Discharge: Family;Available 24 hours/day (Husband and daughter has taken a week off work to help) Type of Home: House Home Access: Stairs to enter Entrance Stairs-Rails: None Technical brewer of Steps: 1 Home Layout: Two level;1/2 bath on main level;Bed/bath upstairs Home Equipment: Walker - 2 wheels      Prior Function Level of Independence: Independent               Hand Dominance   Dominant Hand: Right    Extremity/Trunk Assessment   Upper Extremity Assessment Upper Extremity Assessment: Defer to OT evaluation    Lower Extremity Assessment Lower Extremity Assessment: Generalized weakness (Weakness due to pain/muscle spasms. R weaker than L)    Cervical / Trunk Assessment Cervical / Trunk Assessment: Other exceptions (Recent lumbar surgery.  In lumbar corset)  Communication   Communication: No difficulties  Cognition Arousal/Alertness: Awake/alert Behavior During  Therapy: WFL for tasks assessed/performed Overall Cognitive Status: Within Functional Limits for tasks assessed                                        General Comments General comments (skin integrity, edema, etc.): Pt's daughter [name] present throughout treatment.    Exercises  General Exercises - Lower Extremity Ankle Circles/Pumps: AROM;Both;5 reps;Seated   Assessment/Plan    PT Assessment Patient needs continued PT services  PT Problem List Decreased strength;Decreased activity tolerance;Decreased balance;Decreased mobility;Decreased knowledge of use of DME;Decreased knowledge of precautions;Pain       PT Treatment Interventions DME instruction;Gait training;Stair training;Therapeutic activities;Therapeutic exercise;Patient/family education    PT Goals (Current goals can be found in the Care Plan section)  Acute Rehab PT Goals Patient Stated Goal: Pt agreed to improve mobility PT Goal Formulation: With patient Time For Goal Achievement: 03/01/21 Potential to Achieve Goals: Good    Frequency Min 5X/week   Barriers to discharge        Co-evaluation               AM-PAC PT "6 Clicks" Mobility  Outcome Measure Help needed turning from your back to your side while in a flat bed without using bedrails?: A Little Help needed moving from lying on your back to sitting on the side of a flat bed without using bedrails?: A Little Help needed moving to and from a bed to a chair (including a wheelchair)?: A Little Help needed standing up from a chair using your arms (e.g., wheelchair or bedside chair)?: A Little Help needed to walk in hospital room?: A Little Help needed climbing 3-5 steps with a railing? : A Lot 6 Click Score: 17    End of Session Equipment Utilized During Treatment: Back brace;Gait belt Activity Tolerance: Patient limited by pain Patient left: in chair;with call bell/phone within reach;with family/visitor present Nurse Communication: Mobility status;Other (comment) (muscle spasms) PT Visit Diagnosis: Difficulty in walking, not elsewhere classified (R26.2)    Time: 3790-2409 PT Time Calculation (min) (ACUTE ONLY): 31 min   Charges:   PT Evaluation $PT Eval Moderate Complexity: 1 Mod PT Treatments $Gait Training: 8-22 mins         Lavonia Dana, PT   Acute Rehabilitation Services  Pager [phone] Office [phone] 02/22/2021   Melvern Banker 02/22/2021, 11:26 AM

## 2021-02-22 NOTE — TOC Initial Note (Signed)
Transition of Care [facility]) - Initial/Assessment Note    Patient Details  Name: Julie Obrien MRN: [id number] Date of Birth: 12-10-1950  Transition of Care [facility]) CM/SW Contact:    Julie Chars, LCSW Phone Number: 02/22/2021, 2:01 PM  Clinical Narrative:      CSW received call from Julie Obrien at [place] that her agency was prearranged with Julie Obrien to take this [facility] referral.  CSW met with pt and discussed follow up with [facility], pt in agreement with this.  Permission given to speak with Julie and Julie.  PCP in place.  Pt is vaccinated for covid, but not boosted.               Expected Discharge Plan: [place] Barriers to Discharge: No Barriers Identified   Patient Goals and CMS Choice Patient states their goals for this hospitalization and ongoing recovery are:: be able to stand up straighter CMS Medicare.gov Compare Post Acute Care list provided to::  [facility] HH already has accepted this referral-pt in agreement with this plan.)    Expected Discharge Plan and Services Expected Discharge Plan: [place] In-house Referral: Clinical Social Work   Post Acute Care Choice: [place] arrangements for the past 2 months: Single Family Home                 DME Arranged: N/A (DME through [facility] staff)         [place] Arranged: PT, OT HH Agency: [facility] Date [place]: 02/22/21 Time HH Agency Contacted: 1100 Representative spoke with at [place] Arrangements/Services Living arrangements for the past 2 months: [place] with:: Spouse Patient language and need for interpreter reviewed:: Yes Do you feel safe going back to the place where you live?: Yes      Need for Family Participation in Patient Care: Yes (Comment) Care giver support system in place?: Yes (comment) Current home services: Other (comment) (none) Criminal Activity/Legal Involvement Pertinent to Current  Situation/Hospitalization: No - Comment as needed  Activities of Daily Living      Permission Sought/Granted Permission sought to share information with : Family Supports Permission granted to share information with : Yes, Verbal Permission Granted  Share Information with NAME: Julie Obrien, Julie Obrien           Emotional Assessment Appearance:: Appears stated age Attitude/Demeanor/Rapport: Engaged Affect (typically observed): Appropriate, Pleasant Orientation: : Oriented to Self, Oriented to Place, Oriented to  Time, Oriented to Situation Alcohol / Substance Use: Not Applicable Psych Involvement: No (comment)  Admission diagnosis:  Spinal stenosis of lumbar region [M48.061] Patient Active Problem List   Diagnosis Date Noted   Adult BMI 32.0-32.9 kg/sq m 10/13/2018   Hypoxia 09/15/2018   Tobacco abuse 09/15/2018   CAP (community acquired pneumonia) 09/15/2018   Influenza A 09/12/2018   COPD exacerbation ([place]) 02/11/2018   Asthma with acute exacerbation 02/10/2018   Spinal stenosis of lumbar region 01/05/2016   Thoracic spine fracture, closed, initial encounter 06/21/2015   Myelopathy, spondylogenic, 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

## 2021-02-22 NOTE — Consult Note (Signed)
   [facility] Inpatient Consult   02/22/2021  [name] 05-12-1951 [id number]  [place] [facility] [ACO] Patient: Medicare CMS DCE  Patient was screened for [facility] Management services. Patient is active in [place] practice with Chronic Care Management team.  Plan: Her care management needs is followed by the [facility] Management team.  Please contact for further questions,  Natividad Brood, RN BSN [facility] Liaison  [phone] business mobile phone Toll free office [phone]  Fax number: [phone] [email or web address]

## 2021-02-22 NOTE — Progress Notes (Signed)
Subjective: Patient reports  much better this morning condition of back pain posterior buttock and posterior thigh pain  Objective: Vital signs in last 24 hours: Temp:  [97.7 F (36.5 C)-98.9 F (37.2 C)] 98.7 F (37.1 C) (07/12 0717) Pulse Rate:  [66-84] 76 (07/12 0717) Resp:  [17-22] 18 (07/12 0717) BP: (100-137)/(48-75) 110/48 (07/12 0717) SpO2:  [92 %-100 %] 92 % (07/12 0717)  Intake/Output from previous day: 07/11 0701 - 07/12 0700 In: 2150 [I.V.:1700; IV Piggyback:450] Out: 3295 [Urine:2695; Blood:600] Intake/Output this shift: No intake/output data recorded.  Patient is awake and alert strength is 5 out of 5 wound clean dry and intact  Lab Results: No results for input(s): WBC, HGB, HCT, PLT in the last 72 hours. BMET No results for input(s): NA, K, CL, CO2, GLUCOSE, BUN, CREATININE, CALCIUM in the last 72 hours.  Studies/Results: DG Lumbar Spine 2-3 Views  Result Date: 02/21/2021 CLINICAL DATA:  Extension of lumbar fusion to L1-L2 EXAM: LUMBAR SPINE - 2-3 VIEW; DG C-ARM 1-60 MIN COMPARISON:  12/07/2020 FINDINGS: Again identified BILATERAL pedicle screws at L2 and L3 as well as disc prostheses at L2-L3 and L3-L4. New pedicle screws bilaterally at L1 with new disc prosthesis at L1-L2. Bones demineralized with scattered degenerative disc disease changes. No fracture or subluxation. IMPRESSION: Intraoperative images during L1-L2 fusion. Electronically Signed   By: Lavonia Dana M.D.   On: 02/21/2021 10:59   DG C-Arm 1-60 Min  Result Date: 02/21/2021 CLINICAL DATA:  Extension of lumbar fusion to L1-L2 EXAM: LUMBAR SPINE - 2-3 VIEW; DG C-ARM 1-60 MIN COMPARISON:  12/07/2020 FINDINGS: Again identified BILATERAL pedicle screws at L2 and L3 as well as disc prostheses at L2-L3 and L3-L4. New pedicle screws bilaterally at L1 with new disc prosthesis at L1-L2. Bones demineralized with scattered degenerative disc disease changes. No fracture or subluxation. IMPRESSION: Intraoperative  images during L1-L2 fusion. Electronically Signed   By: Lavonia Dana M.D.   On: 02/21/2021 10:59    Assessment/Plan: Postop day 1 decompressive laminectomy interbody fusion L1-L2 condition of back pain posterior thigh pain minimal headache this morning nonpostural in nature.  Mobilize slowly with physical and Occupational Therapy  LOS: 1 day     Elaina Hoops 02/22/2021, 7:56 AM

## 2021-02-23 ENCOUNTER — Telehealth: Payer: Self-pay

## 2021-02-23 MED ORDER — SENNA 8.6 MG PO TABS
1.0000 | ORAL_TABLET | Freq: Every day | ORAL | Status: DC
  Administered 2021-02-23: 8.6 mg via ORAL
  Filled 2021-02-23: qty 1

## 2021-02-23 MED ORDER — DOCUSATE SODIUM 100 MG PO CAPS
100.0000 mg | ORAL_CAPSULE | Freq: Two times a day (BID) | ORAL | Status: DC
  Administered 2021-02-23 – 2021-02-24 (×2): 100 mg via ORAL
  Filled 2021-02-23 (×2): qty 1

## 2021-02-23 NOTE — Telephone Encounter (Signed)
Transition Care Management Unsuccessful Follow-up Telephone Call  Date of discharge and from where:  [facility] ( Neurosurgery)   Attempts:  1st Attempt  Reason for unsuccessful TCM follow-up call:  Left voice message

## 2021-02-23 NOTE — Evaluation (Signed)
Occupational Therapy Evaluation Patient Details Name: Julie Obrien MRN: [id number] DOB: [birth year] Today's Date: 02/23/2021    History of Present Illness 70 yo female s/p decompressive laminectomy interbody fusion L1-L2 on 02/22/21.  PMH significant for but not lmited to: OA, multiple lumbar surgeries,   Clinical Impression   PTA patient was living with her spouse and daughter in a private residence and was independent with ADLs/IADLs without AD. Reports 4/10 pain at rest and with mild activity. Patient currently presents below baseline level of function demonstrating functional transfers and short-distance functional mobility in hospital room with Min guard and use of RW. OT provided education on spinal precautions, home set-up to maximize safety and independence with self-care tasks, and acquisition/use of AE (would benefit from demo at time of next treatment session). Patient expressed verbal understanding. OT will continue to follow acutely.      Follow Up Recommendations  Home health OT    Equipment Recommendations  3 in 1 bedside commode    Recommendations for Other Services       Precautions / Restrictions Precautions Precautions: Back;Fall Precaution Booklet Issued: Yes (comment) Precaution Comments: Patient able to recall 3/3 spinal precautions; demonstrates good carryover with functional tasks. Required Braces or Orthoses: Spinal Brace Spinal Brace: Lumbar corset;Applied in sitting position Restrictions Weight Bearing Restrictions: No      Mobility Bed Mobility Overal bed mobility: Needs Assistance             General bed mobility comments: Seated EOB upon entry.    Transfers Overall transfer level: Needs assistance Equipment used: Rolling walker (2 wheeled) Transfers: Sit to/from Stand Sit to Stand: Min guard         General transfer comment: Min guard for steadying/safety; cues for hand placement.    Balance Overall balance assessment: Needs  assistance Sitting-balance support: Feet supported Sitting balance-Leahy Scale: Good     Standing balance support: Bilateral upper extremity supported;During functional activity Standing balance-Leahy Scale: Fair Standing balance comment: Reliant on BUE on RW.                           ADL either performed or assessed with clinical judgement   ADL Overall ADL's : Needs assistance/impaired                         Toilet Transfer: Min Fish farm manager Details (indicate cue type and reason): Simulated with transfer to recliner with use of RW. Requires increased time secondary to soreness in low back and buttocks.         Functional mobility during ADLs: Min guard;Rolling walker       Vision   Vision Assessment?: No apparent visual deficits     Perception     Praxis      Pertinent Vitals/Pain Pain Assessment: 0-10 Pain Score: 4  Pain Location: Buttocks and RLE Pain Descriptors / Indicators: Sore;Aching;Grimacing;Guarding Pain Intervention(s): Limited activity within patient's tolerance;Monitored during session;Repositioned     Hand Dominance Right   Extremity/Trunk Assessment Upper Extremity Assessment Upper Extremity Assessment: Overall WFL for tasks assessed   Lower Extremity Assessment Lower Extremity Assessment: Defer to PT evaluation   Cervical / Trunk Assessment Cervical / Trunk Assessment: Other exceptions Cervical / Trunk Exceptions: s/p lumbar surgery.   Communication Communication Communication: No difficulties   Cognition Arousal/Alertness: Awake/alert Behavior During Therapy: WFL for tasks assessed/performed Overall Cognitive Status: Within Functional Limits for tasks assessed  General Comments  Pt's daughter [name] present throughout treatment.    Exercises     Shoulder Instructions      Home Living Family/patient expects to be discharged to:: Private  residence Living Arrangements: Spouse/significant other Available Help at Discharge: Family;Available 24 hours/day (Husband and daughter has taken a week off work to help) Type of Home: House Home Access: Stairs to enter Technical brewer of Steps: 1 Entrance Stairs-Rails: None Home Layout: Two level;1/2 bath on main level;Bed/bath upstairs Alternate Level Stairs-Number of Steps: 13 Alternate Level Stairs-Rails: Right           Home Equipment: Walker - 2 wheels;Adaptive equipment Adaptive Equipment: Reacher        Prior Functioning/Environment Level of Independence: Independent                 OT Problem List: Decreased activity tolerance;Impaired balance (sitting and/or standing);Pain      OT Treatment/Interventions: Self-care/ADL training;Therapeutic exercise;Energy conservation;DME and/or AE instruction;Therapeutic activities;Patient/family education;Balance training    OT Goals(Current goals can be found in the care plan section) Acute Rehab OT Goals Patient Stated Goal: To return home. OT Goal Formulation: With patient Time For Goal Achievement: 03/09/21 Potential to Achieve Goals: Good ADL Goals Pt Will Perform Grooming: with modified independence;standing Pt Will Perform Upper Body Dressing: with modified independence Pt Will Perform Lower Body Dressing: with modified independence;with adaptive equipment;sit to/from stand Pt Will Transfer to Toilet: with modified independence;ambulating Pt Will Perform Toileting - Clothing Manipulation and hygiene: with modified independence;sit to/from stand;sitting/lateral leans Additional ADL Goal #1: Patient will recall 3/3 spinal precautions and demonstrate good carryover during ADLs/IADLs in prep for safe return home.  OT Frequency: Min 2X/week   Barriers to D/C:            Co-evaluation              AM-PAC OT "6 Clicks" Daily Activity     Outcome Measure Help from another person eating meals?:  None Help from another person taking care of personal grooming?: A Little Help from another person toileting, which includes using toliet, bedpan, or urinal?: A Little Help from another person bathing (including washing, rinsing, drying)?: A Little Help from another person to put on and taking off regular upper body clothing?: A Little Help from another person to put on and taking off regular lower body clothing?: A Little 6 Click Score: 19   End of Session Equipment Utilized During Treatment: Gait belt;Rolling walker;Back brace Nurse Communication: Mobility status;Other (comment) (Response to treatment)  Activity Tolerance: Patient tolerated treatment well Patient left: in chair;with call bell/phone within reach  OT Visit Diagnosis: Unsteadiness on feet (R26.81);Muscle weakness (generalized) (M62.81);Pain Pain - Right/Left: Right Pain - part of body:  (buttocks and low back)                Time: 9509-3267 OT Time Calculation (min): 20 min Charges:  OT General Charges $OT Visit: 1 Visit OT Evaluation $OT Eval Low Complexity: 1 Low  Latashia Koch H. OTR/L Supplemental OT, Department of rehab services [phone]  Tyanna Hach R H. 02/23/2021, 2:41 PM

## 2021-02-23 NOTE — Progress Notes (Signed)
Subjective: Patient reports  improving condition of back pain still right hip posterior thigh muscle spasms but did ambulate the halls today  Objective: Vital signs in last 24 hours: Temp:  [98 F (36.7 C)-98.9 F (37.2 C)] 98.6 F (37 C) (07/13 0402) Pulse Rate:  [66-100] 79 (07/13 0402) Resp:  [18] 18 (07/13 0402) BP: (105-122)/(45-80) 106/56 (07/13 0402) SpO2:  [92 %-94 %] 92 % (07/13 0402)  Intake/Output from previous day: 07/12 0701 - 07/13 0700 In: 240 [P.O.:240] Out: 2200 [Urine:2200] Intake/Output this shift: No intake/output data recorded.  Awake and alert strength appears to be 5-5 lower extremities bilaterally patient was working with physical therapy so unable to look at her bandage  Lab Results: No results for input(s): WBC, HGB, HCT, PLT in the last 72 hours. BMET No results for input(s): NA, K, CL, CO2, GLUCOSE, BUN, CREATININE, CALCIUM in the last 72 hours.  Studies/Results: DG Lumbar Spine 2-3 Views  Result Date: 02/21/2021 CLINICAL DATA:  Extension of lumbar fusion to L1-L2 EXAM: LUMBAR SPINE - 2-3 VIEW; DG C-ARM 1-60 MIN COMPARISON:  12/07/2020 FINDINGS: Again identified BILATERAL pedicle screws at L2 and L3 as well as disc prostheses at L2-L3 and L3-L4. New pedicle screws bilaterally at L1 with new disc prosthesis at L1-L2. Bones demineralized with scattered degenerative disc disease changes. No fracture or subluxation. IMPRESSION: Intraoperative images during L1-L2 fusion. Electronically Signed   By: Lavonia Dana M.D.   On: 02/21/2021 10:59   DG C-Arm 1-60 Min  Result Date: 02/21/2021 CLINICAL DATA:  Extension of lumbar fusion to L1-L2 EXAM: LUMBAR SPINE - 2-3 VIEW; DG C-ARM 1-60 MIN COMPARISON:  12/07/2020 FINDINGS: Again identified BILATERAL pedicle screws at L2 and L3 as well as disc prostheses at L2-L3 and L3-L4. New pedicle screws bilaterally at L1 with new disc prosthesis at L1-L2. Bones demineralized with scattered degenerative disc disease changes. No  fracture or subluxation. IMPRESSION: Intraoperative images during L1-L2 fusion. Electronically Signed   By: Lavonia Dana M.D.   On: 02/21/2021 10:59    Assessment/Plan: Postop day 2 decompressive laminectomy interbody fusion making progress still with a lot of muscle spasm but improving and becoming more mobile  LOS: 2 days     Elaina Hoops 02/23/2021, 10:25 AM

## 2021-02-23 NOTE — Progress Notes (Signed)
Physical Therapy Treatment Patient Details Name: Julie Obrien MRN: [id number] DOB: 13-Sep-1950 Today's Date: 02/23/2021    History of Present Illness 70 yo female s/p decompressive laminectomy interbody fusion L1-L2 on 02/22/21.  PMH significant for but not lmited to: OA, multiple lumbar surgeries,    PT Comments    Continuing work on functional mobility and activity tolerance;  Session focused on progressive amb and initiating stair training; Notable improvement in activity tolerance, without onset of spasm RLE until getting some good headway with hallway ambulation; Good demonstration of 5 steps as well; Dr. Saintclair Halsted joined in conversation re: readiness for dc, and we decided together that she would benefit from another night inpatient, with plan for PT and OT tomorrow before dc; on track for dc home tomorrow  Follow Up Recommendations  Home health PT;Supervision for mobility/OOB     Equipment Recommendations  None recommended by PT    Recommendations for Other Services OT consult     Precautions / Restrictions Precautions Precautions: Back;Fall Required Braces or Orthoses: Spinal Brace Spinal Brace: Lumbar corset;Applied in sitting position    Mobility  Bed Mobility               General bed mobility comments: EOB upon arrival    Transfers Overall transfer level: Needs assistance Equipment used: Rolling walker (2 wheeled) Transfers: Sit to/from Stand Sit to Stand: Min guard         General transfer comment: Cues for hand placement, safety, and positioning for slightly more wiehgt shift to L to have less weight through more painful R side  Ambulation/Gait Ambulation/Gait assistance: Min guard;Min assist Gait Distance (Feet): 100 Feet (40, then seated break and stairs, then 60) Assistive device: Rolling walker (2 wheeled) Gait Pattern/deviations: Step-to pattern;Decreased stride length     General Gait Details: Short steps and heavy dependence on RW; Able to  walk about 40 ft before onset of pain and spasm; sat for a rest break, then worked on stairs, and pt opted to walk back most of the way to her room   Stairs Stairs: Yes Stairs assistance: Min assist Stair Management: One rail Right;Sideways;Step to pattern Number of Stairs: 5 General stair comments: verbal and demo cues for technqiue; Slow, but good technique   Wheelchair Mobility    Modified Rankin (Stroke Patients Only)       Balance                                            Cognition Arousal/Alertness: Awake/alert Behavior During Therapy: WFL for tasks assessed/performed Overall Cognitive Status: Within Functional Limits for tasks assessed                                        Exercises      General Comments General comments (skin integrity, edema, etc.): Pt's daughter Julie Obrien present throughout treatment.      Pertinent Vitals/Pain Pain Assessment: 0-10 Pain Score: 7  Pain Location: buttocks and low back.  R side worse than left Pain Descriptors / Indicators: Cramping;Spasm Pain Intervention(s): Monitored during session;Premedicated before session    Home Living                      Prior Function  PT Goals (current goals can now be found in the care plan section) Acute Rehab PT Goals Patient Stated Goal: Hopes to get home soon PT Goal Formulation: With patient Time For Goal Achievement: 03/01/21 Potential to Achieve Goals: Good Progress towards PT goals: Progressing toward goals    Frequency    Min 5X/week      PT Plan Current plan remains appropriate    Co-evaluation              AM-PAC PT "6 Clicks" Mobility   Outcome Measure  Help needed turning from your back to your side while in a flat bed without using bedrails?: A Little Help needed moving from lying on your back to sitting on the side of a flat bed without using bedrails?: A Little Help needed moving to and from a bed to  a chair (including a wheelchair)?: A Little Help needed standing up from a chair using your arms (e.g., wheelchair or bedside chair)?: A Little Help needed to walk in hospital room?: A Little Help needed climbing 3-5 steps with a railing? : A Lot 6 Click Score: 17    End of Session Equipment Utilized During Treatment: Gait belt;Back brace Activity Tolerance: Patient tolerated treatment well (even with onset of spsm) Patient left: in chair;with call bell/phone within reach;with family/visitor present Nurse Communication: Mobility status PT Visit Diagnosis: Difficulty in walking, not elsewhere classified (R26.2)     Time: 1000-1049 PT Time Calculation (min) (ACUTE ONLY): 49 min  Charges:  $Gait Training: 23-37 mins $Therapeutic Activity: 8-22 mins                     Roney Marion, PT  Acute Rehabilitation Services Pager [phone] Office [phone]    [name] 02/23/2021, 12:20 PM

## 2021-02-24 ENCOUNTER — Telehealth: Payer: Self-pay

## 2021-02-24 MED ORDER — METHOCARBAMOL 750 MG PO TABS: 750.0000 mg | ORAL_TABLET | Freq: Four times a day (QID) | ORAL | 0 refills | Status: DC | PRN

## 2021-02-24 MED ORDER — CYCLOBENZAPRINE HCL 5 MG PO TABS: 5.0000 mg | ORAL_TABLET | Freq: Three times a day (TID) | ORAL | 0 refills | Status: DC | PRN

## 2021-02-24 MED ORDER — OXYCODONE-ACETAMINOPHEN 5-325 MG PO TABS: 1.0000 | ORAL_TABLET | ORAL | 0 refills | Status: AC | PRN

## 2021-02-24 NOTE — Progress Notes (Signed)
Physical Therapy Treatment Patient Details Name: Julie Obrien MRN: [id number] DOB: [birth year] Today's Date: 02/24/2021    History of Present Illness 70 yo female s/p decompressive laminectomy interbody fusion L1-L2 on 02/22/21.  PMH significant for but not lmited to: OA, multiple lumbar surgeries,    PT Comments    Pt eager to d/c home, mobilizing at min guard to supervision level. PT reinforcing the importance of maintaining back precautions during mobility and upright posture. Pt to d/c today with support of her family, pt with no further questions.    Follow Up Recommendations  Home health PT;Supervision for mobility/OOB     Equipment Recommendations  None recommended by PT    Recommendations for Other Services       Precautions / Restrictions Precautions Precautions: Back;Fall Precaution Booklet Issued: Yes (comment) Precaution Comments: Patient able to recall 3/3 spinal precautions Required Braces or Orthoses: Spinal Brace Spinal Brace: Lumbar corset;Applied in sitting position Restrictions Weight Bearing Restrictions: No    Mobility  Bed Mobility Overal bed mobility: Needs Assistance Bed Mobility: Rolling;Sidelying to Sit Rolling: Supervision Sidelying to sit: Supervision       General bed mobility comments: up in chair upon PT arrival    Transfers Overall transfer level: Needs assistance Equipment used: Rolling walker (2 wheeled) Transfers: Sit to/from Stand Sit to Stand: Supervision         General transfer comment: for safety, good hand placement when rising/sitting.  Ambulation/Gait Ambulation/Gait assistance: Supervision Gait Distance (Feet): 120 Feet Assistive device: Rolling walker (2 wheeled) Gait Pattern/deviations: Decreased stride length;Step-through pattern;Trunk flexed Gait velocity: decr   General Gait Details: supervision for safety, verbal and tactile cuing for upright posture, avoiding twisting spine during directional  changes.   Stairs Stairs:  (practiced yesterday)           Wheelchair Mobility    Modified Rankin (Stroke Patients Only)       Balance Overall balance assessment: Needs assistance Sitting-balance support: Feet supported Sitting balance-Leahy Scale: Good     Standing balance support: Bilateral upper extremity supported;During functional activity Standing balance-Leahy Scale: Fair Standing balance comment: Reliant on BUE on RW with dynamic balance.                            Cognition Arousal/Alertness: Awake/alert Behavior During Therapy: WFL for tasks assessed/performed Overall Cognitive Status: Within Functional Limits for tasks assessed                                        Exercises      General Comments General comments (skin integrity, edema, etc.): son present      Pertinent Vitals/Pain Pain Assessment: 0-10 Pain Score: 5  Pain Location: Buttocks and RLE Pain Descriptors / Indicators: Sore;Grimacing;Spasm Pain Intervention(s): Limited activity within patient's tolerance;Monitored during session;Repositioned    Home Living                      Prior Function            PT Goals (current goals can now be found in the care plan section) Acute Rehab PT Goals Patient Stated Goal: To return home. PT Goal Formulation: With patient Time For Goal Achievement: 03/01/21 Potential to Achieve Goals: Good Progress towards PT goals: Progressing toward goals    Frequency    Min 5X/week  PT Plan Current plan remains appropriate    Co-evaluation              AM-PAC PT "6 Clicks" Mobility   Outcome Measure  Help needed turning from your back to your side while in a flat bed without using bedrails?: A Little Help needed moving from lying on your back to sitting on the side of a flat bed without using bedrails?: A Little Help needed moving to and from a bed to a chair (including a wheelchair)?: A  Little Help needed standing up from a chair using your arms (e.g., wheelchair or bedside chair)?: A Little Help needed to walk in hospital room?: A Little Help needed climbing 3-5 steps with a railing? : A Little 6 Click Score: 18    End of Session Equipment Utilized During Treatment: Back brace Activity Tolerance: Patient tolerated treatment well (even with onset of spsm) Patient left: in chair;with call bell/phone within reach;with family/visitor present Nurse Communication: Mobility status PT Visit Diagnosis: Difficulty in walking, not elsewhere classified (R26.2)     Time: 5035-4656 PT Time Calculation (min) (ACUTE ONLY): 12 min  Charges:  $Gait Training: 8-22 mins                     Stacie Glaze, PT DPT Acute Rehabilitation Services Pager [phone]  Office [phone]    Roxine Caddy E Ruffin Pyo 02/24/2021, 9:39 AM

## 2021-02-24 NOTE — Telephone Encounter (Signed)
Transition Care Management Follow-up Telephone Call Date of discharge and from where: 02/24/2021 [facility] ( Neurosurgery)  How have you been since you were released from the hospital? Pt says she feels a little better, just exhausted  Any questions or concerns? No  Items Reviewed: Did the pt receive and understand the discharge instructions provided? Yes  Medications obtained and verified? Yes  Other? No  Any new allergies since your discharge? No  Dietary orders reviewed? Yes Do you have support at home? Yes   Home Care and Equipment/Supplies: Were home health services ordered? not applicable If so, what is the name of the agency? N/a   Has the agency set up a time to come to the patient's home? not applicable Were any new equipment or medical supplies ordered?  No What is the name of the medical supply agency? N/a  Were you able to get the supplies/equipment? not applicable Do you have any questions related to the use of the equipment or supplies? No  Functional Questionnaire: (I = Independent and D = Dependent) ADLs: I  Bathing/Dressing- i  Meal Prep- I  Eating- I  Maintaining continence- I  Transferring/Ambulation- I  Managing Meds- I  Follow up appointments reviewed:  PCP Hospital f/u appt confirmed? No  Scheduled to see N/A  on N/A @ N/A. [facility] f/u appt confirmed? Yes  Scheduled to see Meyran NP Neurologist on 03/03/2021 @ [place]. Are transportation arrangements needed? No  If their condition worsens, is the pt aware to call PCP or go to the Emergency Dept.? Yes Was the patient provided with contact information for the PCP's office or ED? Yes Was to pt encouraged to call back with questions or concerns? Yes Pt stated she will call back to make f/u appointment after seeing her neurologist.

## 2021-02-24 NOTE — Discharge Instructions (Signed)

## 2021-02-24 NOTE — Discharge Summary (Signed)
Physician Discharge Summary  Patient ID: Julie Obrien MRN: [id number] DOB/AGE: 09/02/1950 70 y.o.  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 The patient tolerated the procedure well and was taken to the recovery room and then to the floor in stable condition. The hospital course was routine. There were no complications. The wound remained clean dry and intact. Pt had appropriate back soreness. No complaints of leg pain or new N/T/W. The patient remained afebrile with stable vital signs, and tolerated a regular diet. The patient continued to increase activities, and pain was well controlled with oral pain medications.   Consults: None  Significant Diagnostic Studies:  Results for orders placed or performed during the hospital encounter of 02/17/21  Surgical pcr screen   Specimen: Nasal Mucosa; Nasal Swab  Result Value Ref Range   MRSA, PCR NEGATIVE NEGATIVE   Staphylococcus aureus NEGATIVE NEGATIVE  SARS CORONAVIRUS 2 (TAT 6-24 HRS) Nasopharyngeal Nasopharyngeal Swab   Specimen: Nasopharyngeal Swab  Result Value Ref Range   SARS Coronavirus 2 NEGATIVE NEGATIVE  CBC per protocol  Result Value Ref Range   WBC 8.9 4.0 - 10.5 K/uL   RBC 5.11 3.87 - 5.11 MIL/uL   Hemoglobin 13.4 12.0 - 15.0 g/dL   HCT 43.0 36.0 - 46.0 %   MCV 84.1 80.0 - 100.0 fL   MCH 26.2 26.0 - 34.0 pg   MCHC 31.2 30.0 - 36.0 g/dL   RDW 15.2 11.5 - 15.5 %   Platelets 348 150 - 400 K/uL   nRBC 0.0 0.0 - 0.2 %  Type and screen [place]  Result Value Ref Range   ABO/RH(D) O POS    Antibody Screen NEG    Sample Expiration 03/03/2021,2359    Extend sample reason      NO TRANSFUSIONS  OR PREGNANCY IN THE PAST 3 MONTHS Performed at [facility], [address], [place], [place] [address]     DG Lumbar Spine 2-3 Views  Result Date: 02/21/2021 CLINICAL DATA:  Extension of lumbar fusion to L1-L2 EXAM: LUMBAR SPINE - 2-3 VIEW; DG C-ARM 1-60 MIN COMPARISON:  12/07/2020 FINDINGS: Again identified BILATERAL pedicle screws at L2 and L3 as well as disc prostheses at L2-L3 and L3-L4. New pedicle screws bilaterally at L1 with new disc prosthesis at L1-L2. Bones demineralized with scattered degenerative disc disease changes. No fracture or subluxation. IMPRESSION: Intraoperative images during L1-L2 fusion. Electronically Signed   By: Lavonia Dana M.D.   On: 02/21/2021 10:59   DG C-Arm 1-60 Min  Result Date: 02/21/2021 CLINICAL DATA:  Extension of lumbar fusion to L1-L2 EXAM: LUMBAR SPINE - 2-3 VIEW; DG C-ARM 1-60 MIN COMPARISON:  12/07/2020 FINDINGS: Again identified BILATERAL pedicle screws at L2 and L3 as well as disc prostheses at L2-L3 and L3-L4. New pedicle screws bilaterally at L1 with new disc prosthesis at L1-L2. Bones demineralized with scattered degenerative disc disease changes. No fracture or subluxation. IMPRESSION: Intraoperative images during L1-L2 fusion. Electronically Signed   By: Lavonia Dana M.D.   On: 02/21/2021 10:59    Antibiotics:  Anti-infectives (From admission, onward)    Start     Dose/Rate Route Frequency Ordered Stop   02/21/21 1530  ceFAZolin (ANCEF) IVPB 2g/100 mL  premix        2 g 200 mL/hr over 30 Minutes Intravenous Every 8 hours 02/21/21 1506 02/21/21 2315   02/21/21 0615  vancomycin (VANCOCIN) IVPB 1000 mg/200 mL premix        1,000 mg 200 mL/hr over 60 Minutes Intravenous On call to O.R. 02/21/21 2409 02/21/21 0828       Discharge Exam: Blood pressure (!) 106/56, pulse 62, temperature 97.9 F (36.6 C), temperature source Oral, resp. rate 18, height 5\' 3"  (1.6 m), weight 83 kg, SpO2 93 %. Neurologic: Grossly normal Ambulating and voiding  well, incision cdi   Discharge Medications:   Allergies as of 02/24/2021       Reactions   Nisoldipine Hives   Augmentin [amoxicillin-pot Clavulanate] Nausea And Vomiting   "Tore up my stomach" (verified this with her daughter, a physician)   Sulfa Antibiotics Hives        Medication List     STOP taking these medications    Dexilant 60 MG capsule Generic drug: dexlansoprazole       TAKE these medications    acetaminophen 650 MG CR tablet Commonly known as: TYLENOL Take 1,950 mg by mouth daily as needed for pain.   albuterol 108 (90 Base) MCG/ACT inhaler Commonly known as: Ventolin HFA Inhale 1-2 puffs into the lungs every 4 (four) hours as needed for wheezing or shortness of breath.   benzonatate 200 MG capsule Commonly known as: TESSALON Take 200 mg by mouth 3 (three) times daily as needed for cough.   cyclobenzaprine 5 MG tablet Commonly known as: FLEXERIL Take 1 tablet (5 mg total) by mouth 3 (three) times daily as needed for muscle spasms.   methocarbamol 750 MG tablet Commonly known as: ROBAXIN Take 1 tablet (750 mg total) by mouth every 6 (six) hours as needed for muscle spasms.   OMEGA 3 PO Take 1 capsule by mouth 2 (two) times daily after a meal. Omega XL   oxyCODONE-acetaminophen 5-325 MG tablet Commonly known as: Percocet Take 1 tablet by mouth every 4 (four) hours as needed for severe pain.   S.S.S. Tonic Liqd Take 1 Dose by mouth 3 (three) times a week.   traMADol 50 MG tablet Commonly known as: ULTRAM Take 50 mg by mouth every 6 (six) hours as needed for moderate pain or severe pain.   TYLENOL COLD/FLU SEVERE PO Take 2 capsules by mouth 3 (three) times daily as needed (cold symptoms).   Vitamin D (Ergocalciferol) 1.25 MG (50000 UNIT) Caps capsule Commonly known as: DRISDOL TAKE 1 CAPSULE (50,000 UNITS TOTAL) BY MOUTH 2 (TWO) TIMES A WEEK. TAKE ON TUES & FRI               Durable Medical Equipment  (From admission, onward)            Start     Ordered   02/24/21 0822  DME 3-in-1  Once        02/24/21 7353            Disposition: home   Final Dx: PLIF L1-2  Discharge Instructions      Remove dressing in 72 hours   Complete by: As directed    Call MD for:  difficulty breathing, headache or visual disturbances   Complete by: As directed    Call MD for:  hives   Complete by: As directed    Call MD for:  persistant nausea and vomiting   Complete by: As directed  Call MD for:  redness, tenderness, or signs of infection (pain, swelling, redness, odor or green/yellow discharge around incision site)   Complete by: As directed    Call MD for:  severe uncontrolled pain   Complete by: As directed    Call MD for:  temperature >100.4   Complete by: As directed    Diet - low sodium heart healthy   Complete by: As directed    Driving Restrictions   Complete by: As directed    No driving for 2 weeks, no riding in the car for 1 week   Increase activity slowly   Complete by: As directed    Lifting restrictions   Complete by: As directed    No lifting more than 8 lbs        Follow-up Information     [facility] Follow up.   Specialty: [place] Why: This agency, [facility] called [facility], will contact you to schedule your first home visit. Contact information: [place] [address] [address] [phone]                  Signed: Ocie [facility] 02/24/2021, 8:23 AM

## 2021-02-24 NOTE — Progress Notes (Signed)
Occupational Therapy Treatment Patient Details Name: Julie Obrien MRN: [id number] DOB: 06/26/1951 Today's Date: 02/24/2021    History of present illness 70 yo female s/p decompressive laminectomy interbody fusion L1-L2 on 02/22/21.  PMH significant for but not lmited to: OA, multiple lumbar surgeries,   OT comments  Patient met lying supine in bed. Son present at bedside. Patient reporting 4/10 pain in low back/buttocks at rest with slight increased during activity. Session with focus on self-care re-education, activity pacing, patient/family education and compensatory techniques to increase safety/independence and adherence to back precautions. Patient continues to require Min guard to Min A grossly for ADLs with use of RW. Patient to d/c this at with family. Continued recommendation for HHOT.    Follow Up Recommendations  Home health OT    Equipment Recommendations  3 in 1 bedside commode    Recommendations for Other Services      Precautions / Restrictions Precautions Precautions: Back;Fall Precaution Booklet Issued: Yes (comment) Precaution Comments: Patient able to recall 3/3 spinal precautions; demonstrates good carryover with functional tasks. Required Braces or Orthoses: Spinal Brace Spinal Brace: Lumbar corset;Applied in sitting position Restrictions Weight Bearing Restrictions: No       Mobility Bed Mobility Overal bed mobility: Needs Assistance Bed Mobility: Rolling;Sidelying to Sit Rolling: Supervision Sidelying to sit: Supervision       General bed mobility comments: Supervision A for all parts of bed mobility; requires increased time/effort; +rail; good recall of log rolling technique    Transfers Overall transfer level: Needs assistance Equipment used: Rolling walker (2 wheeled) Transfers: Sit to/from Stand Sit to Stand: Min guard;Min assist         General transfer comment: Min guard from elevated surfaces and Min A from low surfaces to boost;  cues for hand placement.    Balance Overall balance assessment: Needs assistance Sitting-balance support: Feet supported Sitting balance-Leahy Scale: Good     Standing balance support: Bilateral upper extremity supported;During functional activity Standing balance-Leahy Scale: Fair Standing balance comment: Reliant on BUE on RW with dynamic balance.                           ADL either performed or assessed with clinical judgement   ADL Overall ADL's : Needs assistance/impaired                 Upper Body Dressing : Set up;Sitting   Lower Body Dressing: Min guard;Sit to/from stand Lower Body Dressing Details (indicate cue type and reason): Min guard with use of reacher; requires increased time/effort Toilet Transfer: Min guard;RW Toilet Transfer Details (indicate cue type and reason): To standard height commode in bathroom. Required increased time to rise. Min A initially to boost. Toileting- Clothing Manipulation and Hygiene: Min guard;Sit to/from stand Toileting - Clothing Manipulation Details (indicate cue type and reason): Min guard for steadying and safety; cues for hand placement     Functional mobility during ADLs: Min guard;Rolling walker       Vision       Perception     Praxis      Cognition Arousal/Alertness: Awake/alert Behavior During Therapy: WFL for tasks assessed/performed Overall Cognitive Status: Within Functional Limits for tasks assessed                                          Exercises       Shoulder Instructions       General Comments Son present at bedside.    Pertinent Vitals/ Pain       Pain Assessment: 0-10 Pain Score: 4  Pain Location: Buttocks and RLE Pain Descriptors / Indicators: Sore;Aching;Grimacing;Guarding Pain Intervention(s): Limited activity within patient's tolerance;Monitored during session;Premedicated before session;Repositioned  Home Living                                           Prior Functioning/Environment              Frequency  Min 2X/week        Progress Toward Goals  OT Goals(current goals can now be found in the care plan section)  Progress towards OT goals: Progressing toward goals  Acute Rehab OT Goals Patient Stated Goal: To return home. OT Goal Formulation: With patient Time For Goal Achievement: 03/09/21 Potential to Achieve Goals: Good ADL Goals Pt Will Perform Grooming: with modified independence;standing Pt Will Perform Upper Body Dressing: with modified independence Pt Will Perform Lower Body Dressing: with modified independence;with adaptive equipment;sit to/from stand Pt Will Transfer to Toilet: with modified independence;ambulating Pt Will Perform Toileting - Clothing Manipulation and hygiene: with modified independence;sit to/from stand;sitting/lateral leans Additional ADL Goal #1: Patient will recall 3/3 spinal precautions and demonstrate good carryover during ADLs/IADLs in prep for safe return home.  Plan Discharge plan remains appropriate;Frequency remains appropriate    Co-evaluation                 AM-PAC OT "6 Clicks" Daily Activity     Outcome Measure   Help from another person eating meals?: None Help from another person taking care of personal grooming?: A Little Help from another person toileting, which includes using toliet, bedpan, or urinal?: A Little Help from another person bathing (including washing, rinsing, drying)?: A Little Help from another person to put on and taking off regular upper body clothing?: A Little Help from another person to put on and taking off regular lower body clothing?: A Little 6 Click Score: 19    End of Session Equipment Utilized During Treatment: Gait belt;Rolling walker;Back brace  OT Visit Diagnosis: Unsteadiness on feet (R26.81);Muscle weakness (generalized) (M62.81);Pain Pain - Right/Left: Right Pain - part of body:  (buttocks and low back)    Activity Tolerance Patient tolerated treatment well   Patient Left in chair;with call bell/phone within reach   Nurse Communication Mobility status;Other (comment) (Response to treatment)        Time: 4010-2725 OT Time Calculation (min): 26 min  Charges: OT General Charges $OT Visit: 1 Visit OT Treatments $Self Care/Home Management : 8-22 mins  Falecia Vannatter H. OTR/L Supplemental OT, Department of rehab services [phone]   Karsen Nakanishi R H. 02/24/2021, 8:33 AM

## 2021-02-24 NOTE — Consult Note (Signed)
   [facility] CM Inpatient Consult   02/24/2021  Julie Obrien 1950/12/28 [id number]  Follow up:  Embedded Team RNCM, Glenard Haring out reached this Probation officer and the patient was not active in a chronic care management program, as she had declined the services.  Patient's transition of care will be followed by the practice for TOC follow up.  No new care management needs noted.  For questions, please contact:  Natividad Brood, RN BSN [facility] Liaison  [phone] business mobile phone Toll free office [phone]  Fax number: [phone] [name]

## 2021-02-28 ENCOUNTER — Telehealth: Payer: Self-pay

## 2021-02-28 DIAGNOSIS — Z4789 Encounter for other orthopedic aftercare: Secondary | ICD-10-CM | POA: Diagnosis not present

## 2021-02-28 DIAGNOSIS — Z981 Arthrodesis status: Secondary | ICD-10-CM | POA: Diagnosis not present

## 2021-02-28 DIAGNOSIS — Z967 Presence of other bone and tendon implants: Secondary | ICD-10-CM | POA: Diagnosis not present

## 2021-02-28 DIAGNOSIS — Z4782 Encounter for orthopedic aftercare following scoliosis surgery: Secondary | ICD-10-CM | POA: Diagnosis not present

## 2021-02-28 DIAGNOSIS — R262 Difficulty in walking, not elsewhere classified: Secondary | ICD-10-CM | POA: Diagnosis not present

## 2021-02-28 DIAGNOSIS — I10 Essential (primary) hypertension: Secondary | ICD-10-CM | POA: Diagnosis not present

## 2021-03-01 NOTE — Telephone Encounter (Signed)
error 

## 2021-03-02 DIAGNOSIS — R262 Difficulty in walking, not elsewhere classified: Secondary | ICD-10-CM | POA: Diagnosis not present

## 2021-03-02 DIAGNOSIS — I10 Essential (primary) hypertension: Secondary | ICD-10-CM | POA: Diagnosis not present

## 2021-03-02 DIAGNOSIS — Z4782 Encounter for orthopedic aftercare following scoliosis surgery: Secondary | ICD-10-CM | POA: Diagnosis not present

## 2021-03-02 DIAGNOSIS — Z4789 Encounter for other orthopedic aftercare: Secondary | ICD-10-CM | POA: Diagnosis not present

## 2021-03-02 DIAGNOSIS — Z967 Presence of other bone and tendon implants: Secondary | ICD-10-CM | POA: Diagnosis not present

## 2021-03-02 DIAGNOSIS — Z981 Arthrodesis status: Secondary | ICD-10-CM | POA: Diagnosis not present

## 2021-03-03 ENCOUNTER — Other Ambulatory Visit (HOSPITAL_COMMUNITY): Payer: Self-pay | Admitting: Neurosurgery

## 2021-03-03 ENCOUNTER — Other Ambulatory Visit: Payer: Self-pay | Admitting: Neurosurgery

## 2021-03-03 ENCOUNTER — Other Ambulatory Visit: Payer: Self-pay

## 2021-03-03 ENCOUNTER — Ambulatory Visit (HOSPITAL_BASED_OUTPATIENT_CLINIC_OR_DEPARTMENT_OTHER)
Admission: RE | Admit: 2021-03-03 | Discharge: 2021-03-03 | Disposition: A | Discharge status: Home / Self Care | Payer: Medicare Other | Source: Ambulatory Visit | Attending: Neurosurgery | Admitting: Neurosurgery

## 2021-03-03 ENCOUNTER — Encounter (HOSPITAL_COMMUNITY): Payer: Self-pay | Admitting: Neurosurgery

## 2021-03-03 DIAGNOSIS — M545 Low back pain, unspecified: Secondary | ICD-10-CM | POA: Diagnosis not present

## 2021-03-03 DIAGNOSIS — M5440 Lumbago with sciatica, unspecified side: Secondary | ICD-10-CM

## 2021-03-03 DIAGNOSIS — M47816 Spondylosis without myelopathy or radiculopathy, lumbar region: Secondary | ICD-10-CM | POA: Diagnosis not present

## 2021-03-03 NOTE — Progress Notes (Signed)
PCP - Dr. Baird Cancer Cardiologist - denies EKG - 06/10/20 Chest x-ray -  ECHO -  Cardiac Cath -  CPAP -   ERAS Protcol - n/a  COVID TEST- DOS  Anesthesia review: n/a  -------------  SDW INSTRUCTIONS:  Your procedure is scheduled on Friday 7/22. Please report to [facility] Main Entrance "A" at 0900 A.M., and check in at the Admitting office. Call this number if you have problems the morning of surgery: [phone]   Remember: Do not eat after midnight the night before your surgery  You may drink clear liquids until 0900 AM the morning of your surgery.   Clear liquids allowed are: Water, Non-Citrus Juices (without pulp), Carbonated Beverages, Clear Tea, Black Coffee Only, and Gatorade   Medications to take morning of surgery with a sip of water include: cyclobenzaprine (FLEXERIL) if needed methocarbamol (ROBAXIN) if needed oxyCODONE-acetaminophen (PERCOCET) if needed   As of today, STOP taking any Aspirin (unless otherwise instructed by your surgeon), Aleve, Naproxen, Ibuprofen, Motrin, Advil, Goody's, BC's, all herbal medications, fish oil, and all vitamins.    The Morning of Surgery Do not wear jewelry, make-up or nail polish. Do not wear lotions, powders, or perfumes, or deodorant Do not shave 48 hours prior to surgery.   Men may shave face and neck. Do not bring valuables to the hospital. [facility] is not responsible for any belongings or valuables.  If you are a smoker, DO NOT Smoke 24 hours prior to surgery If you wear a CPAP at night please bring your mask the morning of surgery  Remember that you must have someone to transport you home after your surgery, and remain with you for 24 hours if you are discharged the same day.  Please bring cases for contacts, glasses, hearing aids, dentures or bridgework because it cannot be worn into surgery.   Patients discharged the day of surgery will not be allowed to drive home.   Please shower the NIGHT BEFORE/MORNING OF  SURGERY (use antibacterial soap like DIAL soap if possible). Wear comfortable clothes the morning of surgery. Oral Hygiene is also important to reduce your risk of infection.  Remember - BRUSH YOUR TEETH THE MORNING OF SURGERY WITH YOUR REGULAR TOOTHPASTE  Patient denies shortness of breath, fever, cough and chest pain.

## 2021-03-04 ENCOUNTER — Inpatient Hospital Stay (HOSPITAL_COMMUNITY)
Admission: RE | Admit: 2021-03-04 | Discharge: 2021-03-14 | DRG: 455 | Disposition: A | Discharge status: Home Health | Payer: Medicare Other | Attending: Neurosurgery | Admitting: Neurosurgery

## 2021-03-04 ENCOUNTER — Other Ambulatory Visit: Payer: Self-pay

## 2021-03-04 ENCOUNTER — Inpatient Hospital Stay (HOSPITAL_COMMUNITY): Payer: Medicare Other

## 2021-03-04 ENCOUNTER — Inpatient Hospital Stay (HOSPITAL_COMMUNITY): Payer: Medicare Other | Admitting: Anesthesiology

## 2021-03-04 ENCOUNTER — Encounter (HOSPITAL_COMMUNITY): Payer: Self-pay | Admitting: Neurosurgery

## 2021-03-04 ENCOUNTER — Encounter (HOSPITAL_COMMUNITY)
Admission: RE | Disposition: A | Discharge status: Home / Self Care | Payer: Self-pay | Source: Home / Self Care | Attending: Neurosurgery

## 2021-03-04 DIAGNOSIS — Z881 Allergy status to other antibiotic agents status: Secondary | ICD-10-CM

## 2021-03-04 DIAGNOSIS — Z820 Family history of epilepsy and other diseases of the nervous system: Secondary | ICD-10-CM

## 2021-03-04 DIAGNOSIS — F17210 Nicotine dependence, cigarettes, uncomplicated: Secondary | ICD-10-CM | POA: Diagnosis present

## 2021-03-04 DIAGNOSIS — M6281 Muscle weakness (generalized): Secondary | ICD-10-CM | POA: Diagnosis not present

## 2021-03-04 DIAGNOSIS — T8484XD Pain due to internal orthopedic prosthetic devices, implants and grafts, subsequent encounter: Secondary | ICD-10-CM | POA: Diagnosis not present

## 2021-03-04 DIAGNOSIS — M960 Pseudarthrosis after fusion or arthrodesis: Secondary | ICD-10-CM | POA: Diagnosis not present

## 2021-03-04 DIAGNOSIS — M549 Dorsalgia, unspecified: Secondary | ICD-10-CM

## 2021-03-04 DIAGNOSIS — Z4789 Encounter for other orthopedic aftercare: Secondary | ICD-10-CM | POA: Diagnosis not present

## 2021-03-04 DIAGNOSIS — Z882 Allergy status to sulfonamides status: Secondary | ICD-10-CM

## 2021-03-04 DIAGNOSIS — S32019A Unspecified fracture of first lumbar vertebra, initial encounter for closed fracture: Secondary | ICD-10-CM | POA: Diagnosis not present

## 2021-03-04 DIAGNOSIS — M545 Low back pain, unspecified: Secondary | ICD-10-CM | POA: Diagnosis not present

## 2021-03-04 DIAGNOSIS — S22089A Unspecified fracture of T11-T12 vertebra, initial encounter for closed fracture: Secondary | ICD-10-CM | POA: Diagnosis not present

## 2021-03-04 DIAGNOSIS — Z888 Allergy status to other drugs, medicaments and biological substances status: Secondary | ICD-10-CM | POA: Diagnosis not present

## 2021-03-04 DIAGNOSIS — Z419 Encounter for procedure for purposes other than remedying health state, unspecified: Secondary | ICD-10-CM

## 2021-03-04 DIAGNOSIS — Y838 Other surgical procedures as the cause of abnormal reaction of the patient, or of later complication, without mention of misadventure at the time of the procedure: Secondary | ICD-10-CM | POA: Diagnosis present

## 2021-03-04 DIAGNOSIS — M5416 Radiculopathy, lumbar region: Secondary | ICD-10-CM | POA: Diagnosis not present

## 2021-03-04 DIAGNOSIS — T84226A Displacement of internal fixation device of vertebrae, initial encounter: Secondary | ICD-10-CM | POA: Diagnosis present

## 2021-03-04 DIAGNOSIS — M4325 Fusion of spine, thoracolumbar region: Secondary | ICD-10-CM | POA: Diagnosis not present

## 2021-03-04 DIAGNOSIS — M532X6 Spinal instabilities, lumbar region: Secondary | ICD-10-CM | POA: Diagnosis not present

## 2021-03-04 DIAGNOSIS — M48061 Spinal stenosis, lumbar region without neurogenic claudication: Secondary | ICD-10-CM | POA: Diagnosis not present

## 2021-03-04 DIAGNOSIS — M79605 Pain in left leg: Secondary | ICD-10-CM | POA: Diagnosis not present

## 2021-03-04 DIAGNOSIS — R109 Unspecified abdominal pain: Secondary | ICD-10-CM | POA: Diagnosis not present

## 2021-03-04 DIAGNOSIS — Z809 Family history of malignant neoplasm, unspecified: Secondary | ICD-10-CM

## 2021-03-04 DIAGNOSIS — Z981 Arthrodesis status: Secondary | ICD-10-CM | POA: Diagnosis not present

## 2021-03-04 DIAGNOSIS — M479 Spondylosis, unspecified: Secondary | ICD-10-CM | POA: Diagnosis not present

## 2021-03-04 DIAGNOSIS — Z20822 Contact with and (suspected) exposure to covid-19: Secondary | ICD-10-CM | POA: Diagnosis present

## 2021-03-04 DIAGNOSIS — G8918 Other acute postprocedural pain: Secondary | ICD-10-CM | POA: Diagnosis not present

## 2021-03-04 DIAGNOSIS — M4846XA Fatigue fracture of vertebra, lumbar region, initial encounter for fracture: Secondary | ICD-10-CM | POA: Diagnosis present

## 2021-03-04 DIAGNOSIS — M4712 Other spondylosis with myelopathy, cervical region: Secondary | ICD-10-CM | POA: Diagnosis not present

## 2021-03-04 DIAGNOSIS — R0902 Hypoxemia: Secondary | ICD-10-CM | POA: Diagnosis not present

## 2021-03-04 DIAGNOSIS — Z7401 Bed confinement status: Secondary | ICD-10-CM | POA: Diagnosis not present

## 2021-03-04 DIAGNOSIS — M4846XD Fatigue fracture of vertebra, lumbar region, subsequent encounter for fracture with routine healing: Secondary | ICD-10-CM | POA: Diagnosis not present

## 2021-03-04 HISTORY — PX: LAMINECTOMY WITH POSTERIOR LATERAL ARTHRODESIS LEVEL 2: SHX6336

## 2021-03-04 LAB — TYPE AND SCREEN
ABO/RH(D): O POS
Antibody Screen: NEGATIVE

## 2021-03-04 LAB — SARS CORONAVIRUS 2 BY RT PCR (HOSPITAL ORDER, PERFORMED IN ~~LOC~~ HOSPITAL LAB): SARS Coronavirus 2: NEGATIVE

## 2021-03-04 SURGERY — LAMINECTOMY WITH POSTERIOR LATERAL ARTHRODESIS LEVEL 2
Anesthesia: General | Site: Spine Lumbar

## 2021-03-04 MED ORDER — ORAL CARE MOUTH RINSE: 15.0000 mL | Freq: Once | OROMUCOSAL | Status: AC

## 2021-03-04 MED ORDER — ROCURONIUM BROMIDE 10 MG/ML (PF) SYRINGE
PREFILLED_SYRINGE | INTRAVENOUS | Status: DC | PRN
  Administered 2021-03-04: 10 mg via INTRAVENOUS
  Administered 2021-03-04: 60 mg via INTRAVENOUS
  Administered 2021-03-04 (×2): 10 mg via INTRAVENOUS

## 2021-03-04 MED ORDER — OXYCODONE HCL 5 MG/5ML PO SOLN: 5.0000 mg | Freq: Once | ORAL | Status: AC | PRN

## 2021-03-04 MED ORDER — HYDROMORPHONE HCL 1 MG/ML IJ SOLN
INTRAMUSCULAR | Status: AC
  Filled 2021-03-04: qty 1

## 2021-03-04 MED ORDER — ACETAMINOPHEN 10 MG/ML IV SOLN
1000.0000 mg | Freq: Four times a day (QID) | INTRAVENOUS | Status: DC
  Administered 2021-03-04: 1000 mg via INTRAVENOUS

## 2021-03-04 MED ORDER — THROMBIN 20000 UNITS EX SOLR
CUTANEOUS | Status: AC
  Filled 2021-03-04: qty 20000

## 2021-03-04 MED ORDER — SUFENTANIL CITRATE 50 MCG/ML IV SOLN
INTRAVENOUS | Status: AC
  Filled 2021-03-04: qty 1

## 2021-03-04 MED ORDER — FENTANYL CITRATE (PF) 100 MCG/2ML IJ SOLN
25.0000 ug | INTRAMUSCULAR | Status: DC | PRN
  Administered 2021-03-04 (×2): 50 ug via INTRAVENOUS

## 2021-03-04 MED ORDER — CEFAZOLIN SODIUM-DEXTROSE 2-3 GM-%(50ML) IV SOLR
INTRAVENOUS | Status: DC | PRN
  Administered 2021-03-04: 2 g via INTRAVENOUS

## 2021-03-04 MED ORDER — PHENOL 1.4 % MT LIQD: 1.0000 | OROMUCOSAL | Status: DC | PRN

## 2021-03-04 MED ORDER — ACETAMINOPHEN 10 MG/ML IV SOLN
INTRAVENOUS | Status: AC
  Filled 2021-03-04: qty 100

## 2021-03-04 MED ORDER — MENTHOL 3 MG MT LOZG: 1.0000 | LOZENGE | OROMUCOSAL | Status: DC | PRN

## 2021-03-04 MED ORDER — METHOCARBAMOL 500 MG PO TABS
ORAL_TABLET | ORAL | Status: AC
  Filled 2021-03-04: qty 2

## 2021-03-04 MED ORDER — FENTANYL CITRATE (PF) 100 MCG/2ML IJ SOLN
INTRAMUSCULAR | Status: AC
  Filled 2021-03-04: qty 2

## 2021-03-04 MED ORDER — ALBUTEROL SULFATE (2.5 MG/3ML) 0.083% IN NEBU: 2.5000 mg | INHALATION_SOLUTION | RESPIRATORY_TRACT | Status: DC | PRN

## 2021-03-04 MED ORDER — DEXAMETHASONE SODIUM PHOSPHATE 10 MG/ML IJ SOLN
INTRAMUSCULAR | Status: AC
  Filled 2021-03-04: qty 2

## 2021-03-04 MED ORDER — CYCLOBENZAPRINE HCL 10 MG PO TABS: 10.0000 mg | ORAL_TABLET | Freq: Three times a day (TID) | ORAL | Status: DC | PRN

## 2021-03-04 MED ORDER — CEFAZOLIN SODIUM-DEXTROSE 2-4 GM/100ML-% IV SOLN
INTRAVENOUS | Status: AC
  Filled 2021-03-04: qty 100

## 2021-03-04 MED ORDER — DEXAMETHASONE SODIUM PHOSPHATE 10 MG/ML IJ SOLN
INTRAMUSCULAR | Status: DC | PRN
  Administered 2021-03-04: 10 mg via INTRAVENOUS

## 2021-03-04 MED ORDER — CEFAZOLIN SODIUM-DEXTROSE 2-4 GM/100ML-% IV SOLN
2.0000 g | Freq: Three times a day (TID) | INTRAVENOUS | Status: AC
  Administered 2021-03-04 – 2021-03-06 (×6): 2 g via INTRAVENOUS
  Filled 2021-03-04 (×6): qty 100

## 2021-03-04 MED ORDER — FENTANYL CITRATE (PF) 100 MCG/2ML IJ SOLN
INTRAMUSCULAR | Status: AC
  Administered 2021-03-04: 100 ug via INTRAVENOUS
  Filled 2021-03-04: qty 2

## 2021-03-04 MED ORDER — PROPOFOL 10 MG/ML IV BOLUS
INTRAVENOUS | Status: AC
  Filled 2021-03-04: qty 20

## 2021-03-04 MED ORDER — PHENYLEPHRINE HCL-NACL 10-0.9 MG/250ML-% IV SOLN
INTRAVENOUS | Status: DC | PRN
  Administered 2021-03-04: 25 ug/min via INTRAVENOUS

## 2021-03-04 MED ORDER — OXYCODONE HCL 5 MG PO TABS
ORAL_TABLET | ORAL | Status: AC
  Filled 2021-03-04: qty 1

## 2021-03-04 MED ORDER — SUGAMMADEX SODIUM 200 MG/2ML IV SOLN
INTRAVENOUS | Status: DC | PRN
  Administered 2021-03-04: 200 mg via INTRAVENOUS

## 2021-03-04 MED ORDER — EPHEDRINE 5 MG/ML INJ
INTRAVENOUS | Status: AC
  Filled 2021-03-04: qty 5

## 2021-03-04 MED ORDER — FENTANYL CITRATE (PF) 100 MCG/2ML IJ SOLN: 100.0000 ug | Freq: Once | INTRAMUSCULAR | Status: AC

## 2021-03-04 MED ORDER — ONDANSETRON HCL 4 MG/2ML IJ SOLN: 4.0000 mg | Freq: Four times a day (QID) | INTRAMUSCULAR | Status: DC | PRN

## 2021-03-04 MED ORDER — BUPIVACAINE LIPOSOME 1.3 % IJ SUSP
INTRAMUSCULAR | Status: DC | PRN
Start: 2021-03-04 — End: 2021-03-04
  Administered 2021-03-04: 20 mL

## 2021-03-04 MED ORDER — SODIUM CHLORIDE 0.9% FLUSH: 3.0000 mL | INTRAVENOUS | Status: DC | PRN

## 2021-03-04 MED ORDER — HYDROMORPHONE HCL 1 MG/ML IJ SOLN
0.5000 mg | INTRAMUSCULAR | Status: DC | PRN
  Administered 2021-03-04 – 2021-03-06 (×3): 0.5 mg via INTRAVENOUS
  Filled 2021-03-04 (×3): qty 0.5

## 2021-03-04 MED ORDER — ROCURONIUM BROMIDE 10 MG/ML (PF) SYRINGE
PREFILLED_SYRINGE | INTRAVENOUS | Status: AC
  Filled 2021-03-04: qty 30

## 2021-03-04 MED ORDER — BUPIVACAINE LIPOSOME 1.3 % IJ SUSP
INTRAMUSCULAR | Status: AC
  Filled 2021-03-04: qty 20

## 2021-03-04 MED ORDER — HYDROMORPHONE HCL 1 MG/ML IJ SOLN
0.5000 mg | Freq: Once | INTRAMUSCULAR | Status: AC
  Administered 2021-03-04: 0.5 mg via INTRAVENOUS

## 2021-03-04 MED ORDER — ONDANSETRON HCL 4 MG/2ML IJ SOLN
INTRAMUSCULAR | Status: DC | PRN
  Administered 2021-03-04: 4 mg via INTRAVENOUS

## 2021-03-04 MED ORDER — ONDANSETRON HCL 4 MG/2ML IJ SOLN
INTRAMUSCULAR | Status: AC
  Filled 2021-03-04: qty 4

## 2021-03-04 MED ORDER — OXYCODONE HCL 5 MG PO TABS
5.0000 mg | ORAL_TABLET | Freq: Once | ORAL | Status: AC | PRN
Start: 2021-03-04 — End: 2021-03-04
  Administered 2021-03-04: 5 mg via ORAL

## 2021-03-04 MED ORDER — CHLORHEXIDINE GLUCONATE 0.12 % MT SOLN
OROMUCOSAL | Status: AC
  Administered 2021-03-04: 15 mL via OROMUCOSAL
  Filled 2021-03-04: qty 15

## 2021-03-04 MED ORDER — PHENYLEPHRINE 40 MCG/ML (10ML) SYRINGE FOR IV PUSH (FOR BLOOD PRESSURE SUPPORT)
PREFILLED_SYRINGE | INTRAVENOUS | Status: DC | PRN
  Administered 2021-03-04: 40 ug via INTRAVENOUS

## 2021-03-04 MED ORDER — LACTATED RINGERS IV SOLN: INTRAVENOUS | Status: DC

## 2021-03-04 MED ORDER — LIDOCAINE-EPINEPHRINE 1 %-1:100000 IJ SOLN
INTRAMUSCULAR | Status: DC | PRN
  Administered 2021-03-04: 10 mL

## 2021-03-04 MED ORDER — LIDOCAINE-EPINEPHRINE 1 %-1:100000 IJ SOLN
INTRAMUSCULAR | Status: AC
  Filled 2021-03-04: qty 1

## 2021-03-04 MED ORDER — 0.9 % SODIUM CHLORIDE (POUR BTL) OPTIME
TOPICAL | Status: DC | PRN
  Administered 2021-03-04 (×2): 1000 mL

## 2021-03-04 MED ORDER — PHENYLEPHRINE 40 MCG/ML (10ML) SYRINGE FOR IV PUSH (FOR BLOOD PRESSURE SUPPORT)
PREFILLED_SYRINGE | INTRAVENOUS | Status: AC
  Filled 2021-03-04: qty 10

## 2021-03-04 MED ORDER — ACETAMINOPHEN 650 MG RE SUPP: 650.0000 mg | RECTAL | Status: DC | PRN

## 2021-03-04 MED ORDER — KETAMINE HCL 10 MG/ML IJ SOLN
INTRAMUSCULAR | Status: DC | PRN
  Administered 2021-03-04: 5 mg via INTRAVENOUS
  Administered 2021-03-04: 20 mg via INTRAVENOUS

## 2021-03-04 MED ORDER — DOXYCYCLINE HYCLATE 100 MG PO TABS
100.0000 mg | ORAL_TABLET | Freq: Two times a day (BID) | ORAL | Status: DC
  Administered 2021-03-04 – 2021-03-14 (×20): 100 mg via ORAL
  Filled 2021-03-04 (×20): qty 1

## 2021-03-04 MED ORDER — PROPOFOL 10 MG/ML IV BOLUS
INTRAVENOUS | Status: DC | PRN
  Administered 2021-03-04: 120 mg via INTRAVENOUS

## 2021-03-04 MED ORDER — OXYCODONE-ACETAMINOPHEN 5-325 MG PO TABS: 1.0000 | ORAL_TABLET | ORAL | Status: DC | PRN

## 2021-03-04 MED ORDER — ACETAMINOPHEN 325 MG PO TABS
650.0000 mg | ORAL_TABLET | ORAL | Status: DC | PRN
  Administered 2021-03-05 – 2021-03-12 (×9): 650 mg via ORAL
  Filled 2021-03-04 (×9): qty 2

## 2021-03-04 MED ORDER — SODIUM CHLORIDE 0.9 % IV SOLN
250.0000 mL | INTRAVENOUS | Status: DC
  Administered 2021-03-04: 250 mL via INTRAVENOUS

## 2021-03-04 MED ORDER — KETAMINE HCL 50 MG/5ML IJ SOSY
PREFILLED_SYRINGE | INTRAMUSCULAR | Status: AC
  Filled 2021-03-04: qty 5

## 2021-03-04 MED ORDER — BUPIVACAINE HCL (PF) 0.25 % IJ SOLN
INTRAMUSCULAR | Status: AC
  Filled 2021-03-04: qty 30

## 2021-03-04 MED ORDER — SODIUM CHLORIDE 0.9% FLUSH
3.0000 mL | Freq: Two times a day (BID) | INTRAVENOUS | Status: DC
  Administered 2021-03-05 – 2021-03-14 (×12): 3 mL via INTRAVENOUS

## 2021-03-04 MED ORDER — ONDANSETRON HCL 4 MG PO TABS: 4.0000 mg | ORAL_TABLET | Freq: Four times a day (QID) | ORAL | Status: DC | PRN

## 2021-03-04 MED ORDER — OXYCODONE HCL 5 MG PO TABS
10.0000 mg | ORAL_TABLET | ORAL | Status: DC | PRN
  Administered 2021-03-05 – 2021-03-06 (×7): 10 mg via ORAL
  Filled 2021-03-04 (×8): qty 2

## 2021-03-04 MED ORDER — CYCLOBENZAPRINE HCL 5 MG PO TABS
5.0000 mg | ORAL_TABLET | Freq: Three times a day (TID) | ORAL | Status: DC | PRN
  Administered 2021-03-05 – 2021-03-08 (×4): 5 mg via ORAL
  Filled 2021-03-04 (×5): qty 1

## 2021-03-04 MED ORDER — SUFENTANIL CITRATE 50 MCG/ML IV SOLN
INTRAVENOUS | Status: DC | PRN
  Administered 2021-03-04: 10 ug via INTRAVENOUS
  Administered 2021-03-04 (×4): 5 ug via INTRAVENOUS

## 2021-03-04 MED ORDER — ONDANSETRON HCL 4 MG/2ML IJ SOLN
4.0000 mg | Freq: Four times a day (QID) | INTRAMUSCULAR | Status: DC | PRN
  Administered 2021-03-05: 4 mg via INTRAVENOUS
  Filled 2021-03-04: qty 2

## 2021-03-04 MED ORDER — CHLORHEXIDINE GLUCONATE 0.12 % MT SOLN: 15.0000 mL | Freq: Once | OROMUCOSAL | Status: AC

## 2021-03-04 MED ORDER — PANTOPRAZOLE SODIUM 40 MG IV SOLR
40.0000 mg | Freq: Every day | INTRAVENOUS | Status: DC
  Administered 2021-03-04: 40 mg via INTRAVENOUS
  Filled 2021-03-04: qty 40

## 2021-03-04 MED ORDER — OMEGA-3-ACID ETHYL ESTERS 1 G PO CAPS
1.0000 | ORAL_CAPSULE | Freq: Two times a day (BID) | ORAL | Status: DC
Start: 2021-03-05 — End: 2021-03-15
  Administered 2021-03-05 – 2021-03-14 (×20): 1 g via ORAL
  Filled 2021-03-04 (×22): qty 1

## 2021-03-04 MED ORDER — THROMBIN 20000 UNITS EX SOLR
CUTANEOUS | Status: DC | PRN
  Administered 2021-03-04: 20 mL via TOPICAL

## 2021-03-04 MED ORDER — LIDOCAINE 2% (20 MG/ML) 5 ML SYRINGE
INTRAMUSCULAR | Status: DC | PRN
  Administered 2021-03-04: 40 mg via INTRAVENOUS

## 2021-03-04 MED ORDER — VITAMIN D (ERGOCALCIFEROL) 1.25 MG (50000 UNIT) PO CAPS
50000.0000 [IU] | ORAL_CAPSULE | ORAL | Status: DC
  Administered 2021-03-07 – 2021-03-14 (×3): 50000 [IU] via ORAL
  Filled 2021-03-04 (×3): qty 1

## 2021-03-04 MED ORDER — METHOCARBAMOL 750 MG PO TABS
750.0000 mg | ORAL_TABLET | Freq: Four times a day (QID) | ORAL | Status: DC | PRN
  Administered 2021-03-04 – 2021-03-14 (×20): 750 mg via ORAL
  Filled 2021-03-04 (×19): qty 1

## 2021-03-04 MED ORDER — ALUM & MAG HYDROXIDE-SIMETH 200-200-20 MG/5ML PO SUSP: 30.0000 mL | Freq: Four times a day (QID) | ORAL | Status: DC | PRN

## 2021-03-04 MED ORDER — LIDOCAINE 2% (20 MG/ML) 5 ML SYRINGE
INTRAMUSCULAR | Status: AC
  Filled 2021-03-04: qty 10

## 2021-03-04 SURGICAL SUPPLY — 59 items
BAG COUNTER SPONGE SURGICOUNT (BAG) ×4 IMPLANT
BAND RUBBER #18 3X1/16 STRL (MISCELLANEOUS) IMPLANT
BENZOIN TINCTURE PRP APPL 2/3 (GAUZE/BANDAGES/DRESSINGS) ×2 IMPLANT
BLADE CLIPPER SURG (BLADE) IMPLANT
BLADE SURG 11 STRL SS (BLADE) ×2 IMPLANT
BONE VIVIGEN FORMABLE 10CC (Bone Implant) ×2 IMPLANT
BUR MATCHSTICK NEURO 3.0 LAGG (BURR) ×2 IMPLANT
BUR PRECISION FLUTE 6.0 (BURR) ×2 IMPLANT
CAGE SABLE 10X26 9-17 22D (Cage) ×2 IMPLANT
CANISTER SUCT 3000ML PPV (MISCELLANEOUS) ×2 IMPLANT
CAP LOCK DLX THRD (Cap) ×8 IMPLANT
CAP REVERE LOCKING (Cap) ×4 IMPLANT
CARTRIDGE OIL MAESTRO DRILL (MISCELLANEOUS) ×1 IMPLANT
DECANTER SPIKE VIAL GLASS SM (MISCELLANEOUS) ×2 IMPLANT
DERMABOND ADVANCED (GAUZE/BANDAGES/DRESSINGS) ×1
DERMABOND ADVANCED .7 DNX12 (GAUZE/BANDAGES/DRESSINGS) ×1 IMPLANT
DIFFUSER DRILL AIR PNEUMATIC (MISCELLANEOUS) ×2 IMPLANT
DRAPE C-ARM 35X43 STRL (DRAPES) ×4 IMPLANT
DRAPE C-ARMOR (DRAPES) ×2 IMPLANT
DRAPE HALF SHEET 40X57 (DRAPES) ×2 IMPLANT
DRAPE LAPAROTOMY 100X72X124 (DRAPES) ×2 IMPLANT
DRAPE MICROSCOPE LEICA (MISCELLANEOUS) IMPLANT
DRAPE SURG 17X23 STRL (DRAPES) ×2 IMPLANT
DRSG OPSITE 4X5.5 SM (GAUZE/BANDAGES/DRESSINGS) ×2 IMPLANT
DRSG OPSITE POSTOP 4X10 (GAUZE/BANDAGES/DRESSINGS) ×2 IMPLANT
ELECT REM PT RETURN 9FT ADLT (ELECTROSURGICAL) ×2
ELECTRODE REM PT RTRN 9FT ADLT (ELECTROSURGICAL) ×1 IMPLANT
GAUZE 4X4 16PLY ~~LOC~~+RFID DBL (SPONGE) ×2 IMPLANT
GAUZE SPONGE 4X4 12PLY STRL (GAUZE/BANDAGES/DRESSINGS) ×2 IMPLANT
GLOVE SURG ENC MOIS LTX SZ7 (GLOVE) ×4 IMPLANT
GLOVE SURG ENC MOIS LTX SZ8 (GLOVE) ×4 IMPLANT
GLOVE SURG LTX SZ7.5 (GLOVE) ×4 IMPLANT
GLOVE SURG UNDER LTX SZ8.5 (GLOVE) ×2 IMPLANT
GLOVE SURG UNDER POLY LF SZ7 (GLOVE) ×6 IMPLANT
GOWN STRL REUS W/ TWL LRG LVL3 (GOWN DISPOSABLE) ×3 IMPLANT
GOWN STRL REUS W/ TWL XL LVL3 (GOWN DISPOSABLE) ×2 IMPLANT
GOWN STRL REUS W/TWL 2XL LVL3 (GOWN DISPOSABLE) IMPLANT
GOWN STRL REUS W/TWL LRG LVL3 (GOWN DISPOSABLE) ×6
GOWN STRL REUS W/TWL XL LVL3 (GOWN DISPOSABLE) ×4
KIT BASIN OR (CUSTOM PROCEDURE TRAY) ×2 IMPLANT
KIT INFUSE MEDIUM (Orthopedic Implant) ×2 IMPLANT
KIT TURNOVER KIT B (KITS) ×2 IMPLANT
NEEDLE HYPO 22GX1.5 SAFETY (NEEDLE) ×2 IMPLANT
NEEDLE SPNL 22GX3.5 QUINCKE BK (NEEDLE) ×2 IMPLANT
NS IRRIG 1000ML POUR BTL (IV SOLUTION) ×4 IMPLANT
OIL CARTRIDGE MAESTRO DRILL (MISCELLANEOUS) ×2
PACK LAMINECTOMY NEURO (CUSTOM PROCEDURE TRAY) ×2 IMPLANT
ROD CURVED TI 6.35X125 (Rod) ×4 IMPLANT
SCREW CREO DLX POLY 6.5X40 (Screw) ×8 IMPLANT
SPONGE SURGIFOAM ABS GEL 100 (HEMOSTASIS) ×2 IMPLANT
SPONGE SURGIFOAM ABS GEL SZ50 (HEMOSTASIS) IMPLANT
STRIP CLOSURE SKIN 1/2X4 (GAUZE/BANDAGES/DRESSINGS) ×4 IMPLANT
SUT VIC AB 0 CT1 18XCR BRD8 (SUTURE) ×2 IMPLANT
SUT VIC AB 0 CT1 8-18 (SUTURE) ×4
SUT VIC AB 2-0 CT1 18 (SUTURE) ×4 IMPLANT
SUT VICRYL 4-0 PS2 18IN ABS (SUTURE) ×2 IMPLANT
TOWEL GREEN STERILE (TOWEL DISPOSABLE) ×2 IMPLANT
TOWEL GREEN STERILE FF (TOWEL DISPOSABLE) ×2 IMPLANT
WATER STERILE IRR 1000ML POUR (IV SOLUTION) ×2 IMPLANT

## 2021-03-04 NOTE — Op Note (Signed)
Preoperative diagnosis: L1-2 instability from failed fusion L1-L2 with displaced L1 screws possible pedicle fracture bilateral L1-L2 radiculopathies  Postoperative diagnosis: Same with confirmed pedicle fracture on the right at L1 and loose intervertebral cages  Procedure: #1 reexploration fusion move of hardware with disconnecting the knots in the rods and removal of loose L1 pedicle screws redo decompressive laminectomy L1-L2 with removal of the fractured pedicle fragment foraminotomies redo of L1 and removal of bilateral intervertebral cages  2.  Pedicle screw fixation T11-L3 with placement of new pedicle screws at T11 and T12 bilaterally tying into previously placed L2-L3 pedicle screws  3.  Posterior lumbar interbody fusion with placement of expandable 22 degree lordotic 9 mm to 17 mm expandable Sable globus cage packed with vivigen and BMP  4.  Posterior lateral arthrodesis T11 L2 utilizing BMP in the vivigen  Surgeon: Dominica Severin Pansy Ostrovsky  Assistant: Ashok Pall  Anesthesia: General  EBL: Minimal  HPI: 70-year-old female previously about a week and a half ago and undergone an L1 to extension of her fusion.  Patient presented to clinic this week with progressive worsening back and bilateral hip and leg pain rating down L1-L2 nerve root pattern plain films in the office showed what appeared to be pulling back of her L1 screws.  Cages appear to be in good position.  Emergent CT scan confirmed L1 pedicle screws had been displaced posteriorly however no overt fracture was appreciated cages appear to be in good position there did appear appear to be a migration of graft material dorsal lateral on the right.  So due to patient progression of clinical syndrome imaging findings I recommended reexploration of fusion and redo fusion over L1-L2.  I extensively went over the risks and benefits of the procedure with her as well as perioperative course expectations of outcome and alternatives of surgery and she  understood and agreed to proceed forward.  Operative procedure: Patient was brought into the OR was [place] general anesthesia positioned prone Wilson frame her back was prepped and draped in routine sterile fashion.  Her old incision was opened up and extended slightly cephalad subperiosteal dissection was carried out exposing the spinal laminar complex from T10 down into the old construct.  I did identify the old hardware disconnected the knots remove the rods in both L1 screws came out freely with the rod they were very loose.  I also identified a fracture on the right pedicle displacing into the L1 nerve root.  I performed a redo foraminotomies the L1 nerve root moving removing the medial portion of the fractured pedicle.  In addition on the left side I performed a redo foraminotomy L1 nerve root the pedicle did not appear to be fractured on the left but I confirmed adequate foraminotomies at L1.  I then identified the intervertebral cages they were loose and freely floating.  I then proceeded to remove both of those cages.  Then under fluoroscopy I placed pedicle screws at T11 and T12 bilaterally all screws had excellent purchase I then attached the rods anchored the L2 pedicle screw in place I attempted to distract to try to close off the hyperlordotic condition of the L1-L2 interspace got very minimal closure with distraction on the L2 and T12 pedicle.  However locked the rod down in place anchored all those knots in place then look at the interbody I packed additional bone graft anteriorly in the disc base as well as a small piece of BMP then I selected an expandable cage 22 degree lordosis  9 mm posterior height expanding up to 17 mm and inserted this across the midline I then backfilled it with imaging.  Fluoroscopy confirmed good position of all the interbody and the screws.  Then was copiously irrigated meticulous hemostasis was maintained I aggressively decorticated the lamina and facet complexes and TPs  from T11 down L2 packed the remainder of the BMP and FiberGen posterior laterally from T11-L2 laid Gelfoam over the dura injected Exparel in the fascia placed a medium Hemovac drain.  I then closed the wound in layers with opted Vicryl in a running 4 subcuticular.  Dermabond benzoin Steri-Strips and a sterile dressing was applied patient recovery in stable condition.  At the end the case all needle counts and sponge counts were correct.

## 2021-03-04 NOTE — H&P (Signed)
Julie Obrien is an 70 y.o. female.   Chief Complaint: Back and right greater than left hip groin and leg pain HPI: 70 year old female who a week and a half ago underwent decompressive laminectomy interbody fusion L1-L2 presented to the office yesterday with increased groin pain severe hip pain work-up revealed failure of her fusion with pulling back of her L1 screws.  So due to the patient's failure fusion progressive clinical syndrome imaging findings I recommended reexploration and redo fusion with extension up to T11 with bilateral pedicle screws placed at [id number] removal of certainly left L1 screw possible replacement with a larger screw possible removal of right L1 screw.  And tying in the right all this into her previous construct.  I extensively gone over the risks and benefits of this procedure with her as well as perioperative course expectations of outcome and alternatives of surgery and she understands and agrees to proceed forward.  Past Medical History:  Diagnosis Date   Arthritis    hnp, low back , knees - " all OA"   Complication of anesthesia    nausea one surgery more recent   History of kidney stones    Pneumonia    Feb.- Mar. 2017, not hospitalized, but pending surgery was cancelled ,since then, pt. seen by PCP, told that it was cleared     PONV (postoperative nausea and vomiting)     Past Surgical History:  Procedure Laterality Date   BACK SURGERY  08   CERVICAL DISCECTOMY  09   CESAREAN SECTION     x4   CYST EXCISION     vocal cords?   CYSTO  99   stone   HARDWARE REMOVAL N/A 06/21/2015   Procedure: Exploration of Fusion and removal of hardware right thoracic level one;  Surgeon: Kary Kos, MD;  Location: [place] [facility] OR[facility];  Service: Neurosurgery;  Laterality: N/A;   POSTERIOR CERVICAL FUSION/FORAMINOTOMY N/A 05/31/2015   Procedure: Decompression and Laminectomy Cervical three-four  Cervical six-seven  Cervical seven-thoracic-one Posterior cervical fusion with lateral  mass fixation;  Surgeon: Kary Kos, MD;  Location: [place] [facility];  Service: Neurosurgery;  Laterality: N/A;   TONSILLECTOMY     TUBAL LIGATION      Family History  Problem Relation Age of Onset   Dementia Mother    Cancer Mother    Cancer Sister    Cancer Maternal Aunt    Social History:  reports that she has been smoking cigarettes. She has a 8.00 pack-year smoking history. She has never used smokeless tobacco. She reports that she does not drink alcohol and does not use drugs.  Allergies:  Allergies  Allergen Reactions   Nisoldipine Hives   Augmentin [Amoxicillin-Pot Clavulanate] Nausea And Vomiting    "Tore up my stomach" (verified this with her daughter, a physician).     Sulfa Antibiotics Hives    Medications Prior to Admission  Medication Sig Dispense Refill   cyclobenzaprine (FLEXERIL) 5 MG tablet Take 1 tablet (5 mg total) by mouth 3 (three) times daily as needed for muscle spasms. 30 tablet 0   doxycycline (MONODOX) 100 MG capsule Take 100 mg by mouth 2 (two) times daily.     methocarbamol (ROBAXIN) 750 MG tablet Take 1 tablet (750 mg total) by mouth every 6 (six) hours as needed for muscle spasms. 40 tablet 0   Omega-3 Fatty Acids (OMEGA 3 PO) Take 1 capsule by mouth 2 (two) times daily after a meal. Omega XL     oxyCODONE-acetaminophen (  PERCOCET) 5-325 MG tablet Take 1 tablet by mouth every 4 (four) hours as needed for severe pain. 20 tablet 0   Vitamin D, Ergocalciferol, (DRISDOL) 1.25 MG (50000 UNIT) CAPS capsule TAKE 1 CAPSULE (50,000 UNITS TOTAL) BY MOUTH 2 (TWO) TIMES A WEEK. TAKE ON TUES & FRI 8 capsule 1   albuterol (VENTOLIN HFA) 108 (90 Base) MCG/ACT inhaler Inhale 1-2 puffs into the lungs every 4 (four) hours as needed for wheezing or shortness of breath. 18 g 11    Results for orders placed or performed during the hospital encounter of 03/04/21 (from the past 48 hour(s))  SARS Coronavirus 2 by RT PCR (hospital order, performed in [place] hospital lab)  Nasopharyngeal Nasopharyngeal Swab     Status: None   Collection Time: 03/04/21  9:32 AM   Specimen: Nasopharyngeal Swab  Result Value Ref Range   SARS Coronavirus 2 NEGATIVE NEGATIVE    Comment: (NOTE) SARS-CoV-2 target nucleic acids are NOT DETECTED.  The SARS-CoV-2 RNA is generally detectable in upper and lower respiratory specimens during the acute phase of infection. The lowest concentration of SARS-CoV-2 viral copies this assay can detect is 250 copies / mL. A negative result does not preclude SARS-CoV-2 infection and should not be used as the sole basis for treatment or other patient management decisions.  A negative result may occur with improper specimen collection / handling, submission of specimen other than nasopharyngeal swab, presence of viral mutation(s) within the areas targeted by this assay, and inadequate number of viral copies (<250 copies / mL). A negative result must be combined with clinical observations, patient history, and epidemiological information.  Fact Sheet for Patients:   StrictlyIdeas.no  Fact Sheet for Healthcare Providers: BankingDealers.co.za  This test is not yet approved or  cleared by the [place] FDA and has been authorized for detection and/or diagnosis of SARS-CoV-2 by FDA under an Emergency Use Authorization (EUA).  This EUA will remain in effect (meaning this test can be used) for the duration of the COVID-19 declaration under Section 564(b)(1) of the Act, 21 U.S.C. section 360bbb-3(b)(1), unless the authorization is terminated or revoked sooner.  Performed at [facility], [place] [address], [place], [place] [address]    CT LUMBAR SPINE WO CONTRAST  Result Date: 03/03/2021 CLINICAL DATA:  Lumbar surgery 02/21/2021.  Back pain. EXAM: CT LUMBAR SPINE WITHOUT CONTRAST TECHNIQUE: Multidetector CT imaging of the lumbar spine was performed without intravenous contrast administration.  Multiplanar CT image reconstructions were also generated. COMPARISON:  Operative images [id number]. Previous lumbar CT 06/11/2019. FINDINGS: Segmentation: 5 lumbar type vertebral bodies as numbered previously. Alignment: No malalignment. Vertebrae: Chronic fusion from L2 to the sacrum. Interval removal of pedicle screws at L4. Interval placement of an interbody spacer at L1-2 with pedicle screws at L1. Posterior rods now present from L1 through L3. Posterior fusion bone has been placed. No unexpected or complicating feature by CT at this time. Paraspinal and other soft tissues: Expected changes related to the recent surgery at L1-2. No unexpected soft tissue finding. Disc levels: Solid bridging osteophytes from T10 through T12. Prominent anterior osteophytes at T12-L1 but without definite solid bridging. No stenosis in the lower thoracic region or at T12-L1. As above, at L1-2 there has been interval discectomy and fusion. Two interbody spacers appear well position. Pedicle screws appear well position. Pedicle screws and posterior rods from L1-L3. No complicating feature at the L1-2 level is visible. L2 to sacrum: Previous posterior decompression, diskectomy and fusion procedures. Solid  union throughout that region without complicating feature. Recent removal of the pedicle screws at L4. IMPRESSION: Extension of the fusion to the L1-2 level. Discectomy and fusion procedure. Interbody spacers, pedicle screws and posterior rods appear well positioned. No unexpected postoperative finding. Expected soft tissue changes related to the surgery. Pedicle screws at L4 have been removed, without unexpected finding related to that. Electronically Signed   By: Nelson Chimes M.D.   On: 03/03/2021 17:38    Review of Systems  Musculoskeletal:  Positive for back pain.  Neurological:  Positive for numbness.   Blood pressure 125/60, pulse 94, temperature 98 F (36.7 C), resp. rate 18, height '5\' 3"'$  (1.6 m), weight 82.6 kg, SpO2 97  %. Physical Exam HENT:     Head: Normocephalic.     Right Ear: Tympanic membrane normal.     Nose: Nose normal.  Eyes:     Pupils: Pupils are equal, round, and reactive to light.  Cardiovascular:     Rate and Rhythm: Normal rate.     Pulses: Normal pulses.  Pulmonary:     Effort: Pulmonary effort is normal.  Abdominal:     General: Abdomen is flat.  Musculoskeletal:        General: Normal range of motion.  Skin:    General: Skin is warm.  Neurological:     Mental Status: She is alert.     Comments: Strength 5-5 iliopsoas, quads, hamstrings, gastroc, tibialis, and EHL.     Assessment/Plan 70 year old female presents for revision of her fusion with extension up to T11  Elaina Hoops, MD 03/04/2021, 12:05 PM

## 2021-03-04 NOTE — Anesthesia Preprocedure Evaluation (Signed)
Anesthesia Evaluation  Patient identified by MRN, date of birth, ID band Patient awake    Reviewed: Allergy & Precautions, H&P , NPO status , Patient's Chart, lab work & pertinent test results  History of Anesthesia Complications (+) PONV and history of anesthetic complications  Airway Mallampati: II   Neck ROM: full    Dental   Pulmonary asthma , COPD, Current Smoker,    breath sounds clear to auscultation       Cardiovascular negative cardio ROS   Rhythm:regular Rate:Normal     Neuro/Psych    GI/Hepatic   Endo/Other    Renal/GU stones     Musculoskeletal  (+) Arthritis ,   Abdominal   Peds  Hematology   Anesthesia Other Findings   Reproductive/Obstetrics                             Anesthesia Physical Anesthesia Plan  ASA: 3  Anesthesia Plan: General   Post-op Pain Management:    Induction: Intravenous  PONV Risk Score and Plan: 3 and Ondansetron, Dexamethasone and Treatment may vary due to age or medical condition  Airway Management Planned: Oral ETT  Additional Equipment:   Intra-op Plan:   Post-operative Plan: Extubation in OR  Informed Consent: I have reviewed the patients History and Physical, chart, labs and discussed the procedure including the risks, benefits and alternatives for the proposed anesthesia with the patient or authorized representative who has indicated his/her understanding and acceptance.     Dental advisory given  Plan Discussed with: CRNA, Anesthesiologist and Surgeon  Anesthesia Plan Comments:         Anesthesia Quick Evaluation

## 2021-03-04 NOTE — Transfer of Care (Signed)
Immediate Anesthesia Transfer of Care Note  Patient: Julie Obrien  Procedure(s) Performed: posterior lateral fusion - Thoracic eleven-Thoracic twelve - Thoracic twelve-Lumbar one, Redo Lumbar One-Two Posterior Lumbar Interbody Fusion (Spine Lumbar)  Patient Location: PACU  Anesthesia Type:General  Level of Consciousness: drowsy and patient cooperative  Airway & Oxygen Therapy: Patient Spontanous Breathing  Post-op Assessment: Report given to RN and Post -op Vital signs reviewed and stable  Post vital signs: Reviewed and stable  Last Vitals:  Vitals Value Taken Time  BP 104/63 03/04/21 1630  Temp 36.7 C 03/04/21 1630  Pulse 101 03/04/21 1635  Resp 16 03/04/21 1635  SpO2 94 % 03/04/21 1635  Vitals shown include unvalidated device data.  Last Pain:  Vitals:   03/04/21 1021  PainSc: 10-Worst pain ever      Patients Stated Pain Goal: 3 ([id number])  Complications: No notable events documented.

## 2021-03-04 NOTE — Anesthesia Procedure Notes (Signed)
Procedure Name: Intubation Date/Time: 03/04/2021 12:54 PM Performed by: Georgia Duff, CRNA Pre-anesthesia Checklist: Patient identified, Emergency Drugs available, Suction available and Patient being monitored Patient Re-evaluated:Patient Re-evaluated prior to induction Oxygen Delivery Method: Circle System Utilized Preoxygenation: Pre-oxygenation with 100% oxygen Induction Type: IV induction Ventilation: Mask ventilation without difficulty Laryngoscope Size: Miller and 2 Grade View: Grade I Tube type: Oral Tube size: 7.0 mm Number of attempts: 1 Airway Equipment and Method: Stylet and Oral airway Placement Confirmation: ETT inserted through vocal cords under direct vision, positive ETCO2 and breath sounds checked- equal and bilateral Secured at: 22 cm Tube secured with: Tape Dental Injury: Teeth and Oropharynx as per pre-operative assessment

## 2021-03-04 NOTE — Progress Notes (Signed)
Notified by RN of patient complaining of diffuse abdominal pain in the PACU. She reports maximal tenderness bilaterally in the inguinal area. KUB ordered. Image and interpretation by radiologist reviewed. No acute issue identified at this time. Will transfer patient to the floor.

## 2021-03-04 NOTE — Anesthesia Procedure Notes (Signed)
Central Venous Catheter Insertion Performed by: Albertha Ghee, MD, anesthesiologist Start/End7/22/2022 11:45 AM, 03/04/2021 11:57 AM Patient location: Pre-op. Preanesthetic checklist: patient identified, IV checked, site marked, risks and benefits discussed, surgical consent, monitors and equipment checked, pre-op evaluation, timeout performed and anesthesia consent Position: Trendelenburg Lidocaine 1% used for infiltration and patient sedated Hand hygiene performed , maximum sterile barriers used  and Seldinger technique used Catheter size: 7 Fr Central line was placed.Double lumen Procedure performed using ultrasound guided technique. Ultrasound Notes:anatomy identified, needle tip was noted to be adjacent to the nerve/plexus identified, no ultrasound evidence of intravascular and/or intraneural injection and image(s) printed for medical record Attempts: 1 Following insertion, line sutured, dressing applied and Biopatch. Post procedure assessment: blood return through all ports, free fluid flow and no air  Patient tolerated the procedure well with no immediate complications.

## 2021-03-05 MED ORDER — PANTOPRAZOLE SODIUM 40 MG PO TBEC
40.0000 mg | DELAYED_RELEASE_TABLET | Freq: Every day | ORAL | Status: DC
  Administered 2021-03-05 – 2021-03-14 (×10): 40 mg via ORAL
  Filled 2021-03-05 (×10): qty 1

## 2021-03-05 MED ORDER — KETOROLAC TROMETHAMINE 15 MG/ML IJ SOLN
15.0000 mg | Freq: Once | INTRAMUSCULAR | Status: AC
  Administered 2021-03-05: 15 mg via INTRAVENOUS
  Filled 2021-03-05: qty 1

## 2021-03-05 MED ORDER — CHLORHEXIDINE GLUCONATE CLOTH 2 % EX PADS
6.0000 | MEDICATED_PAD | Freq: Every day | CUTANEOUS | Status: DC
  Administered 2021-03-05 – 2021-03-13 (×8): 6 via TOPICAL

## 2021-03-05 NOTE — Progress Notes (Signed)
Notified Dr Christella Noa of pt's severe headache. Pt was given oxycodone '10mg'$  po at 1633 and given tylenol '650mg'$  po at 2003. Pt states the pain is a 10/10 and the headache is anterior and pain is where the sinus around the nose. RN will continue to monitor.

## 2021-03-05 NOTE — Evaluation (Signed)
Physical Therapy Evaluation Patient Details Name: Julie Obrien MRN: [id number] DOB: 1951-07-17 Today's Date: 03/05/2021   History of Present Illness  70 y.o. F admitted to [facility] on 03/05/21 for redo fusion of L1-L2 with extension up to T11 with bilateral pedicle screws placed at T11-T12 and replacing R L1 screw with a larger one. Prior medical history is significant for, but not limited to OA and multiple lumbar surgeries.  Clinical Impression  PTA, patient reports having difficulty managing at home due to increased pain and weakness. Patient presents with generalized weakness, impaired balance, decreased activity tolerance, and impaired functional mobility. Patient required +2 for safety due to reports of feeling weak and lightheaded. Patient able to complete functional transfers with min-modA+2 with use of RW. Educated patient on back precautions. Patient will benefit from skilled PT services during acute stay to address listed deficits. Patient states she would prefer to go to rehab prior to returning home which PT is in agreement with. Therefore, recommend SNF at discharge to maximize functional independence and safety.     Follow Up Recommendations SNF;Supervision/Assistance - 24 hour    Equipment Recommendations  None recommended by PT    Recommendations for Other Services       Precautions / Restrictions Precautions Precautions: Back;Fall Precaution Booklet Issued: Yes (comment) Precaution Comments: Patient able to recall 3/3 spinal precautions Restrictions Weight Bearing Restrictions: No      Mobility  Bed Mobility Overal bed mobility: Needs Assistance Bed Mobility: Rolling;Sit to Sidelying Rolling: Supervision       Sit to sidelying: Min guard General bed mobility comments: Able to follow log roll technique for all bed mobility    Transfers Overall transfer level: Needs assistance Equipment used: Rolling Le[place] Cuervo (2 wheeled) Transfers: Sit to/from [employer] Sit to Stand: Min assist;Mod assist;+2 safety/equipment Stand pivot transfers: Min assist;+2 safety/equipment       General transfer comment: From bed in lowest setting pt needed mod A +2 to stand, from [facility] pt was able to stand with Min A +2. Transfers are very taxing and painful for pt at this time, requiring increased effort and time. Patient reports feeling lightheaded with mobility  Ambulation/Gait             General Gait Details: deferred due to pain and feeling lightheaded  Stairs            Wheelchair Mobility    Modified Rankin (Stroke Patients Only)       Balance Overall balance assessment: Needs assistance Sitting-balance support: Feet supported Sitting balance-Leahy Scale: Fair     Standing balance support: Bilateral upper extremity supported;During functional activity Standing balance-Leahy Scale: Poor Standing balance comment: Reliant on BUE on RW                             Pertinent Vitals/Pain Pain Assessment: 0-10 Pain Score: 8  Faces Pain Scale: Hurts whole lot Pain Location: Back Pain Descriptors / Indicators: Aching;Discomfort;Grimacing;Guarding Pain Intervention(s): Limited activity within patient's tolerance;Monitored during session;Repositioned    Home Living Family/patient expects to be discharged to:: Private residence Living Arrangements: Spouse/significant other Available Help at Discharge: Family;Available 24 hours/day Type of Home: House Home Access: Stairs to enter Entrance Stairs-Rails: None Entrance Stairs-Number of Steps: 1 Home Layout: Two level;1/2 bath on main level;Bed/bath upstairs Home Equipment: Jimeka Balan - 2 wheels;Adaptive equipment      Prior Function Level of Independence: Needs assistance   Gait / Transfers Assistance Needed:  Limited mobility with RW due to severe hip and groin pain post discharge 2 weeks ago.  ADL's / Homemaking Assistance Needed: Assist with all LB ADL's since d/c 2  weeks ago.  Comments: patient reporting difficulty maintaining self at home due to increased pain and limited mobility, prefers to d/c to rehab prior to returning home     Hand Dominance   Dominant Hand: Right    Extremity/Trunk Assessment   Upper Extremity Assessment Upper Extremity Assessment: Defer to OT evaluation    Lower Extremity Assessment Lower Extremity Assessment: Generalized weakness    Cervical / Trunk Assessment Cervical / Trunk Assessment: Other exceptions Cervical / Trunk Exceptions: s/p lumbar surgery.  Communication   Communication: No difficulties  Cognition Arousal/Alertness: Awake/alert Behavior During Therapy: WFL for tasks assessed/performed Overall Cognitive Status: Within Functional Limits for tasks assessed                                        General Comments General comments (skin integrity, edema, etc.): Daughter present and supportive    Exercises     Assessment/Plan    PT Assessment Patient needs continued PT services  PT Problem List Decreased strength;Decreased activity tolerance;Decreased balance;Decreased mobility;Decreased knowledge of use of DME;Decreased knowledge of precautions;Pain       PT Treatment Interventions DME instruction;Gait training;Stair training;Therapeutic activities;Therapeutic exercise;Patient/family education    PT Goals (Current goals can be found in the Care Plan section)  Acute Rehab PT Goals Patient Stated Goal: To lessen pain and get stronger PT Goal Formulation: With patient Time For Goal Achievement: 03/19/21 Potential to Achieve Goals: Good    Frequency Min 5X/week   Barriers to discharge        Co-evaluation PT/OT/SLP Co-Evaluation/Treatment: Yes Reason for Co-Treatment: For patient/therapist safety;To address functional/ADL transfers PT goals addressed during session: Mobility/safety with mobility;Balance;Proper use of DME OT goals addressed during session: ADL's and  self-care;Proper use of Adaptive equipment and DME       AM-PAC PT "6 Clicks" Mobility  Outcome Measure Help needed turning from your back to your side while in a flat bed without using bedrails?: A Little Help needed moving from lying on your back to sitting on the side of a flat bed without using bedrails?: A Little Help needed moving to and from a bed to a chair (including a wheelchair)?: Total Help needed standing up from a chair using your arms (e.g., wheelchair or bedside chair)?: Total Help needed to walk in hospital room?: Total Help needed climbing 3-5 steps with a railing? : Total 6 Click Score: 10    End of Session Equipment Utilized During Treatment: Gait belt Activity Tolerance: Patient tolerated treatment well Patient left: in bed;with call bell/phone within reach;with bed alarm set Nurse Communication: Mobility status PT Visit Diagnosis: Unsteadiness on feet (R26.81);Muscle weakness (generalized) (M62.81);Difficulty in walking, not elsewhere classified (R26.2)    Time: [id number] PT Time Calculation (min) (ACUTE ONLY): 24 min   Charges:   PT Evaluation $PT Eval Moderate Complexity: 1 Mod          Nima Kemppainen A. Gilford Rile PT, DPT Acute Rehabilitation Services Pager [phone] Office [phone]   Linna Hoff 03/05/2021, 1:22 PM

## 2021-03-05 NOTE — Plan of Care (Signed)

## 2021-03-05 NOTE — Evaluation (Signed)
Occupational Therapy Evaluation Patient Details Name: Julie Obrien MRN: [id number] DOB: [birth year] Today's Date: 03/05/2021    History of Present Illness 70 y.o. F admitted to [facility] on 03/05/21 for redo fusion of L1-L2 with extension up to T11 with bilateral pedicle screws placed at T11-T12 and replacing R L1 screw with a larger one. Prior medical history is significant for, but not limited to OA and multiple lumbar surgeries.   Clinical Impression   Pt admitted for procedure listed above. PTA pt was having difficulty with all ADL's and mobility due to pain and weakness. Pt continued to present with increased pain this session, requiring min-mod A +2 for all functional mobility OOB, and min-max A for all ADL's. Pt will benefit from skilled intensive therapies at this time to maximize her potential to return to independence. OT will follow acutely.     Follow Up Recommendations  SNF;Supervision/Assistance - 24 hour    Equipment Recommendations  None recommended by OT    Recommendations for Other Services       Precautions / Restrictions Precautions Precautions: Back;Fall Precaution Booklet Issued: Yes (comment) Precaution Comments: Patient able to recall 3/3 spinal precautions Restrictions Weight Bearing Restrictions: No      Mobility Bed Mobility Overal bed mobility: Needs Assistance Bed Mobility: Rolling;Sit to Sidelying Rolling: Supervision       Sit to sidelying: Min guard General bed mobility comments: Able to follow log roll technique for all bed mobility    Transfers Overall transfer level: Needs assistance Equipment used: Rolling walker (2 wheeled) Transfers: Sit to/from [employer] Sit to Stand: Min assist;Mod assist;+2 safety/equipment Stand pivot transfers: Min assist;+2 safety/equipment       General transfer comment: From bed in lowest setting pt needed mod A +2 to stand, from [facility] pt was able to stand with Min A +2. Transfers are very  taxing and painful for pt at this time, requiring increased effort and time.    Balance Overall balance assessment: Needs assistance Sitting-balance support: Feet supported Sitting balance-Leahy Scale: Fair     Standing balance support: Bilateral upper extremity supported;During functional activity Standing balance-Leahy Scale: Poor Standing balance comment: Reliant on BUE on RW with dynamic balance.                           ADL either performed or assessed with clinical judgement   ADL Overall ADL's : Needs assistance/impaired Eating/Feeding: Independent;Sitting   Grooming: Set up;Sitting   Upper Body Bathing: Minimal assistance;Sitting   Lower Body Bathing: Moderate assistance;+2 for safety/equipment;Sitting/lateral leans;Sit to/from stand   Upper Body Dressing : Minimal assistance;Sitting   Lower Body Dressing: Maximal assistance;+2 for safety/equipment;Sitting/lateral leans;Sit to/from stand   Toilet Transfer: Minimal assistance;Moderate assistance;+2 for safety/equipment;Stand-pivot Toilet Transfer Details (indicate cue type and reason): Pt c/o dizziness and presenting with increased weakness, +2 for safety with min-mod A +2 to stand from bed and BSC Toileting- Clothing Manipulation and Hygiene: Maximal assistance;Sitting/lateral lean;Sit to/from stand   Tub/ Shower Transfer: Minimal assistance;Moderate assistance;+2 for safety/equipment;Stand-pivot   Functional mobility during ADLs: Minimal assistance;+2 for safety/equipment;Rolling walker General ADL Comments: Pt very weak with decreased activity tolerance, requiring increased level of support and assist.     Vision Baseline Vision/History: Wears glasses Wears Glasses: Reading only Patient Visual Report: No change from baseline Vision Assessment?: No apparent visual deficits     Perception Perception Perception Tested?: No   Praxis Praxis Praxis tested?: Not tested    Pertinent Vitals/Pain  Pain  Assessment: Faces Faces Pain Scale: Hurts whole lot Pain Location: Back Pain Descriptors / Indicators: Aching;Discomfort;Grimacing;Guarding Pain Intervention(s): Limited activity within patient's tolerance;Monitored during session;Repositioned     Hand Dominance Right   Extremity/Trunk Assessment Upper Extremity Assessment Upper Extremity Assessment: Generalized weakness   Lower Extremity Assessment Lower Extremity Assessment: Defer to PT evaluation   Cervical / Trunk Assessment Cervical / Trunk Assessment: Other exceptions Cervical / Trunk Exceptions: s/p lumbar surgery.   Communication Communication Communication: No difficulties   Cognition Arousal/Alertness: Awake/alert Behavior During Therapy: WFL for tasks assessed/performed Overall Cognitive Status: Within Functional Limits for tasks assessed                                     General Comments  Daughter present and supportive    Exercises     Shoulder Instructions      Home Living Family/patient expects to be discharged to:: Private residence Living Arrangements: Spouse/significant other Available Help at Discharge: Family;Available 24 hours/day Type of Home: House Home Access: Stairs to enter [employer] of Steps: 1 Entrance Stairs-Rails: None Home Layout: Two level;1/2 bath on main level;Bed/bath upstairs Alternate Level Stairs-Number of Steps: 13 Alternate Level Stairs-Rails: Right Bathroom Shower/Tub: Teacher, early years/pre: Standard Bathroom Accessibility: Yes How Accessible: Accessible via walker Home Equipment: [place] - 2 wheels;Adaptive equipment Adaptive Equipment: Reacher        Prior Functioning/Environment Level of Independence: Needs assistance  Gait / Transfers Assistance Needed: Limited mobility with RW due to severe hip and groin pain post discharge 2 weeks ago. ADL's / Homemaking Assistance Needed: Assist with all LB ADL's since d/c 2 weeks  ago.            OT Problem List: Decreased strength;Decreased activity tolerance;Impaired balance (sitting and/or standing);Pain      OT Treatment/Interventions: Self-care/ADL training;Therapeutic exercise;Energy conservation;DME and/or AE instruction;Therapeutic activities;Patient/family education;Balance training    OT Goals(Current goals can be found in the care plan section) Acute Rehab OT Goals Patient Stated Goal: To lessen pain and get stronger OT Goal Formulation: With patient Time For Goal Achievement: 03/19/21 Potential to Achieve Goals: Good ADL Goals Pt Will Perform Grooming: with supervision;standing Pt Will Perform Lower Body Bathing: with supervision;with adaptive equipment;sit to/from stand;sitting/lateral leans Pt Will Perform Lower Body Dressing: with supervision;with adaptive equipment;sitting/lateral leans;sit to/from stand Pt Will Transfer to Toilet: with supervision;ambulating Pt Will Perform Toileting - Clothing Manipulation and hygiene: with supervision;sitting/lateral leans;sit to/from stand;with adaptive equipment  OT Frequency: Min 2X/week   Barriers to D/C:            Co-evaluation PT/OT/SLP Co-Evaluation/Treatment: Yes Reason for Co-Treatment: For patient/therapist safety;To address functional/ADL transfers PT goals addressed during session: Mobility/safety with mobility;Balance;Proper use of DME OT goals addressed during session: ADL's and self-care;Proper use of Adaptive equipment and DME      AM-PAC OT "6 Clicks" Daily Activity     Outcome Measure Help from another person eating meals?: None Help from another person taking care of personal grooming?: A Little Help from another person toileting, which includes using toliet, bedpan, or urinal?: A Lot Help from another person bathing (including washing, rinsing, drying)?: A Lot Help from another person to put on and taking off regular upper body clothing?: A Little Help from another person to  put on and taking off regular lower body clothing?: A Lot 6 Click Score: 16   End of Session Equipment Utilized  During Treatment: Gait belt;Rolling walker Nurse Communication: Mobility status  Activity Tolerance: Patient limited by fatigue;Patient limited by pain Patient left: in bed;with call bell/phone within reach;with bed alarm set  OT Visit Diagnosis: Unsteadiness on feet (R26.81);Muscle weakness (generalized) (M62.81);Pain Pain - part of body:  (Back)                Time: [id number] OT Time Calculation (min): 18 min Charges:  OT General Charges $OT Visit: 1 Visit OT Evaluation $OT Eval Moderate Complexity: 1 Mod  Amelya Mabry H., OTR/L Acute Rehabilitation  Dayvian Blixt Elane Yolanda Bonine 03/05/2021, 12:09 PM

## 2021-03-05 NOTE — Plan of Care (Signed)
  Problem: Safety: Goal: Ability to remain free from injury will improve Outcome: Progressing   

## 2021-03-05 NOTE — Progress Notes (Signed)
Patient ID: Julie Obrien, female   DOB: Sep 14, 1950, 70 y.o.   MRN: [id number] BP (!) 110/50 (BP Location: Left Arm)   Pulse 80   Temp 98.2 F (36.8 C) (Oral)   Resp 15   Ht '5\' 3"'$  (1.6 m)   Wt 82.6 kg   SpO2 99%   BMI 32.24 kg/m  Alert and oriented x4 Speech is clear and fluent Moving lower extremities well Wound is clean and dry, drain in place and working, will leave in place Hip pain is much better.

## 2021-03-06 MED ORDER — DIAZEPAM 5 MG PO TABS
5.0000 mg | ORAL_TABLET | Freq: Four times a day (QID) | ORAL | Status: DC | PRN
  Administered 2021-03-06 – 2021-03-12 (×7): 5 mg via ORAL
  Filled 2021-03-06 (×7): qty 1

## 2021-03-06 MED ORDER — OXYCODONE HCL 5 MG PO TABS
5.0000 mg | ORAL_TABLET | ORAL | Status: DC | PRN
  Administered 2021-03-06 – 2021-03-14 (×22): 5 mg via ORAL
  Filled 2021-03-06 (×23): qty 1

## 2021-03-06 MED ORDER — DOCUSATE SODIUM 100 MG PO CAPS
100.0000 mg | ORAL_CAPSULE | Freq: Two times a day (BID) | ORAL | Status: DC
  Administered 2021-03-06 – 2021-03-14 (×17): 100 mg via ORAL
  Filled 2021-03-06 (×17): qty 1

## 2021-03-06 MED ORDER — OXYCODONE HCL ER 15 MG PO T12A
15.0000 mg | EXTENDED_RELEASE_TABLET | Freq: Two times a day (BID) | ORAL | Status: DC
Start: 2021-03-06 — End: 2021-03-15
  Administered 2021-03-06 – 2021-03-14 (×17): 15 mg via ORAL
  Filled 2021-03-06 (×17): qty 1

## 2021-03-06 NOTE — Plan of Care (Signed)

## 2021-03-06 NOTE — Plan of Care (Signed)
  Problem: Activity: Goal: Risk for activity intolerance will decrease Outcome: Progressing   Problem: Coping: Goal: Level of anxiety will decrease Outcome: Progressing   Problem: Pain Managment: Goal: General experience of comfort will improve Outcome: Progressing   Problem: Safety: Goal: Ability to remain free from injury will improve Outcome: Progressing   Problem: Skin Integrity: Goal: Risk for impaired skin integrity will decrease Outcome: Progressing   

## 2021-03-06 NOTE — Progress Notes (Signed)
Physical Therapy Treatment Patient Details Name: Julie Obrien MRN: [id number] DOB: March 19, 1951 Today's Date: 03/06/2021    History of Present Illness 70 y.o. F admitted to [facility] on 03/05/21 for redo fusion of L1-L2 with extension up to T11 with bilateral pedicle screws placed at T11-T12 and replacing R L1 screw with a larger one. Prior medical history is significant for, but not limited to OA and multiple lumbar surgeries.    PT Comments    Pt is progressing well with mobility and was able to walk a short distance in the room with RW, min assist and chair to follow (although, not ultimately needed).  Pt continues to have pain at her incision, but none down her right leg.  Per RN MD adjusted her pain meds to see if a new combination would work better.  PT will continue to follow acutely for safe mobility progression.  Follow Up Recommendations  SNF     Equipment Recommendations  None recommended by PT    Recommendations for Other Services       Precautions / Restrictions Precautions Precautions: Back;Fall Precaution Booklet Issued: No Precaution Comments: pt reported 3/3 precautions Required Braces or Orthoses:  (none this admission) Spinal Brace:  (none this admission)    Mobility  Bed Mobility Overal bed mobility: Needs Assistance Bed Mobility: Rolling;Sidelying to Sit Rolling: Min assist Sidelying to sit: Mod assist       General bed mobility comments: Min assist to roll to right side with railing, cues to flex bil knees to prepare to log roll.  Mod assist to support trunk to come up to sitting from sidelying.    Transfers Overall transfer level: Needs assistance Equipment used: Rolling walker (2 wheeled) Transfers: Sit to/from Stand Sit to Stand: Min assist;From elevated surface         General transfer comment: Min assist to come to standing from elevated bed and elevated BSC in bathroom.  Cues for safe hand placement during transitions, painful transition down  to low recliner chair.  Ambulation/Gait Ambulation/Gait assistance: Min assist;+2 safety/equipment Gait Distance (Feet): 25 Feet Assistive device: Rolling walker (2 wheeled) Gait Pattern/deviations: Step-through pattern;Shuffle;Trunk flexed     General Gait Details: Cues for upright posture, safe RW use, walked to the door in the room, to the bathroom and then to the recliner chair.   Stairs             Wheelchair Mobility    Modified Rankin (Stroke Patients Only)       Balance Overall balance assessment: Needs assistance Sitting-balance support: Feet supported;Bilateral upper extremity supported;No upper extremity supported Sitting balance-Leahy Scale: Fair     Standing balance support: Bilateral upper extremity supported;No upper extremity supported;Single extremity supported Standing balance-Leahy Scale: Poor Standing balance comment: able to stand at sink and wash both hands at once, leaning trunk against counter.                            Cognition Arousal/Alertness: Awake/alert Behavior During Therapy: WFL for tasks assessed/performed Overall Cognitive Status: Within Functional Limits for tasks assessed                                        Exercises      General Comments General comments (skin integrity, edema, etc.): Educated re: functional use of back precautions.      Pertinent  Vitals/Pain Pain Assessment: Faces Faces Pain Scale: Hurts whole lot Pain Location: Back Pain Descriptors / Indicators: Grimacing;Guarding Pain Intervention(s): Limited activity within patient's tolerance;Monitored during session;Premedicated before session;Repositioned    Home Living                      Prior Function            PT Goals (current goals can now be found in the care plan section) Progress towards PT goals: Progressing toward goals    Frequency    Min 5X/week      PT Plan Current plan remains appropriate     Co-evaluation              AM-PAC PT "6 Clicks" Mobility   Outcome Measure  Help needed turning from your back to your side while in a flat bed without using bedrails?: A Little Help needed moving from lying on your back to sitting on the side of a flat bed without using bedrails?: A Lot Help needed moving to and from a bed to a chair (including a wheelchair)?: A Little Help needed standing up from a chair using your arms (e.g., wheelchair or bedside chair)?: A Little Help needed to walk in hospital room?: A Little Help needed climbing 3-5 steps with a railing? : A Lot 6 Click Score: 16    End of Session Equipment Utilized During Treatment: Gait belt Activity Tolerance: Patient limited by pain Patient left: in chair;with call bell/phone within reach;with chair alarm set   PT Visit Diagnosis: Muscle weakness (generalized) (M62.81);Difficulty in walking, not elsewhere classified (R26.2);Other symptoms and signs involving the nervous system (R29.898);Pain Pain - Right/Left:  (incisional midline) Pain - part of body:  (incisional midline back)     Time: [id number] PT Time Calculation (min) (ACUTE ONLY): 31 min  Charges:  $Gait Training: 8-22 mins $Therapeutic Activity: 8-22 mins                    Verdene Lennert, PT, DPT  Acute Rehabilitation Ortho Tech Supervisor [phone] pager [phone]) [phone] office

## 2021-03-06 NOTE — Progress Notes (Signed)
Patient ID: Julie Obrien, female   DOB: 09-24-1950, 70 y.o.   MRN: [id number] BP (!) 99/44 (BP Location: Right Arm)   Pulse 85   Temp 99.5 F (37.5 C) (Oral)   Resp 20   Ht '5\' 3"'$  (1.6 m)   Wt 82.6 kg   SpO2 96%   BMI 32.24 kg/m  Alert and oriented x 4, in a great deal of pain Wound is clean, dry. Drain removed Will modify pain regimen Pt/ot recommending snf Moving lower extremities well

## 2021-03-07 NOTE — Anesthesia Postprocedure Evaluation (Signed)
Anesthesia Post Note  Patient: Julie Obrien  Procedure(s) Performed: posterior lateral fusion - Thoracic eleven-Thoracic twelve - Thoracic twelve-Lumbar one, Redo Lumbar One-Two Posterior Lumbar Interbody Fusion (Spine Lumbar)     Patient location during evaluation: PACU Anesthesia Type: General Level of consciousness: awake and alert Pain management: pain level controlled Vital Signs Assessment: post-procedure vital signs reviewed and stable Respiratory status: spontaneous breathing, nonlabored ventilation, respiratory function stable and patient connected to nasal cannula oxygen Cardiovascular status: blood pressure returned to baseline and stable Postop Assessment: no apparent nausea or vomiting Anesthetic complications: no   No notable events documented.  Last Vitals:  Vitals:   03/06/21 1935 03/07/21 0806  BP: (!) 98/51 (!) 104/51  Pulse: 88 91  Resp: 16 18  Temp: 36.8 C 36.9 C  SpO2: 100%     Last Pain:  Vitals:   03/07/21 1111  TempSrc:   PainSc: 8                  Joas Motton S

## 2021-03-07 NOTE — Plan of Care (Signed)

## 2021-03-07 NOTE — Progress Notes (Signed)
Subjective: Patient reports  doing better today no leg pain condition of back pain  Objective: Vital signs in last 24 hours: Temp:  [98.3 F (36.8 C)-98.5 F (36.9 C)] 98.5 F (36.9 C) (07/25 0806) Pulse Rate:  [88-91] 91 (07/25 0806) Resp:  [16-18] 18 (07/25 0806) BP: (98-104)/(51) 104/51 (07/25 0806) SpO2:  [100 %] 100 % (07/24 1935)  Intake/Output from previous day: No intake/output data recorded. Intake/Output this shift: No intake/output data recorded.  Strength out of 5 wound clean dry and intact  Lab Results: No results for input(s): WBC, HGB, HCT, PLT in the last 72 hours. BMET No results for input(s): NA, K, CL, CO2, GLUCOSE, BUN, CREATININE, CALCIUM in the last 72 hours.  Studies/Results: No results found.  Assessment/Plan: Postop day 3 reexploration lumbar fusion with revision doing well progress with physical therapy patient is stable for transfer to rehab unit when bed becomes available.  LOS: 3 days     Elaina Hoops 03/07/2021, 2:56 PM

## 2021-03-07 NOTE — Progress Notes (Signed)
Physical Therapy Treatment Patient Details Name: Julie Obrien MRN: [id number] DOB: 18-Nov-1950 Today's Date: 03/07/2021    History of Present Illness 70 y.o. F admitted to [facility] on 03/05/21 for redo fusion of L1-L2 with extension up to T11 with bilateral pedicle screws placed at T11-T12 and replacing R L1 screw with a larger one. Prior medical history is significant for, but not limited to OA and multiple lumbar surgeries.    PT Comments    Pt supine in bed on arrival, rolled into sidelying for neural glides in effort to alleviate LLE pain.  Performed knee flexion and Hip flexion in sidelying.  Pt able to progress gt training this session.  Pt very slow and guarded due to pain and continues to benefit from skilled rehab in a post acute setting.      Follow Up Recommendations  SNF     Equipment Recommendations  None recommended by PT    Recommendations for Other Services       Precautions / Restrictions Precautions Precautions: Back;Fall Precaution Booklet Issued: No Precaution Comments: pt reported 3/3 precautions Restrictions Weight Bearing Restrictions: No    Mobility  Bed Mobility Overal bed mobility: Needs Assistance Bed Mobility: Rolling;Sidelying to Sit Rolling: Min assist Sidelying to sit: Mod assist       General bed mobility comments: Min assistance to roll to R side.  Pt required assistance to advance LEs to edge of bed and elevate trunk into a seated position.    Transfers Overall transfer level: Needs assistance Equipment used: Rolling walker (2 wheeled) Transfers: Sit to/from Stand Sit to Stand: Min assist;From elevated surface         General transfer comment: Cues for hand placement to and from seated surface, performed from edge of bed and commode.  Pt very slow and guarded due to pain especially when returning to seated surface.  Ambulation/Gait Ambulation/Gait assistance: Min assist Gait Distance (Feet): 60 Feet (+56f.) Assistive device:  Rolling walker (2 wheeled) Gait Pattern/deviations: Step-through pattern;Shuffle;Trunk flexed;Narrow base of support     General Gait Details: Continued cues for upright posture and forward gaze.  Pt with increased pain in L hip.  Cues for turning and backing to maintain spinal precautions   Stairs             Wheelchair Mobility    Modified Rankin (Stroke Patients Only)       Balance Overall balance assessment: Needs assistance Sitting-balance support: Feet supported;Bilateral upper extremity supported;No upper extremity supported Sitting balance-Leahy Scale: Fair       Standing balance-Leahy Scale: Poor Standing balance comment: able to stand at sink and wash both hands at once, leaning trunk against counter.                            Cognition Arousal/Alertness: Awake/alert Behavior During Therapy: WFL for tasks assessed/performed Overall Cognitive Status: Within Functional Limits for tasks assessed                                        Exercises Total Joint Exercises Knee Flexion: AAROM;10 reps;Sidelying;Left Other Exercises Other Exercises: L hip flexion in sidelying.  x 10 reps.    General Comments        Pertinent Vitals/Pain Pain Assessment: Faces Faces Pain Scale: Hurts whole lot Pain Location: Back Pain Descriptors / Indicators: Grimacing;Guarding Pain Intervention(s): Monitored during  session;Repositioned    Home Living                      Prior Function            PT Goals (current goals can now be found in the care plan section) Acute Rehab PT Goals Patient Stated Goal: To lessen pain and get stronger Potential to Achieve Goals: Good Progress towards PT goals: Progressing toward goals    Frequency    Min 5X/week      PT Plan Current plan remains appropriate    Co-evaluation              AM-PAC PT "6 Clicks" Mobility   Outcome Measure  Help needed turning from your back to your  side while in a flat bed without using bedrails?: A Little Help needed moving from lying on your back to sitting on the side of a flat bed without using bedrails?: A Little Help needed moving to and from a bed to a chair (including a wheelchair)?: A Little Help needed standing up from a chair using your arms (e.g., wheelchair or bedside chair)?: A Little Help needed to walk in hospital room?: A Little Help needed climbing 3-5 steps with a railing? : A Little 6 Click Score: 18    End of Session Equipment Utilized During Treatment: Gait belt Activity Tolerance: Patient limited by pain Patient left: in chair;with call bell/phone within reach;with chair alarm set Nurse Communication: Mobility status PT Visit Diagnosis: Muscle weakness (generalized) (M62.81);Difficulty in walking, not elsewhere classified (R26.2);Other symptoms and signs involving the nervous system (R29.898);Pain Pain - Right/Left:  (incisional midline.)     Time: [id number] PT Time Calculation (min) (ACUTE ONLY): 25 min  Charges:  $Gait Training: 8-22 mins $Therapeutic Activity: 8-22 mins                     [name] , PTA Acute Rehabilitation Services Pager [phone] Office [phone]    Julie Obrien 03/07/2021, 12:57 PM

## 2021-03-07 NOTE — NC FL2 (Signed)
[place] LEVEL OF CARE SCREENING TOOL     IDENTIFICATION  Patient Name: Julie Obrien Birthdate: [birth year] Sex: female Admission Date (Current Location): 03/04/2021  [facility] and [place] Number:  Herbalist and Address:  The . [facility] [place] [address], [place], [place] [address]      Provider Number: [id number]  Attending Physician Name and Address:  Kary Kos, MD  Relative Name and Phone Number:       Current Level of Care: Hospital Recommended Level of Care: [place] Prior Approval Number:    Date Approved/Denied:   PASRR Number: [id number]  Discharge Plan: SNF    Current Diagnoses: Patient Active Problem List   Diagnosis Date Noted   Fatigue fracture of vertebra, lumbar region, initial encounter for fracture 03/04/2021   Adult BMI 32.0-32.9 kg/sq m 10/13/2018   Hypoxia 09/15/2018   Tobacco abuse 09/15/2018   CAP (community acquired pneumonia) 09/15/2018   Influenza A 09/12/2018   COPD exacerbation ([place]) 02/11/2018   Asthma with acute exacerbation 02/10/2018   Spinal stenosis of lumbar region 01/05/2016   Thoracic spine fracture, closed, initial encounter 06/21/2015   Myelopathy, spondylogenic, cervical 05/31/2015    Orientation RESPIRATION BLADDER Height & Weight     Self, Time, Situation, Place  Normal Continent Weight: 82.6 kg Height:  '5\' 3"'$  (160 cm)  BEHAVIORAL SYMPTOMS/MOOD NEUROLOGICAL BOWEL NUTRITION STATUS      Continent Diet (refer to d/c summary)  AMBULATORY STATUS COMMUNICATION OF NEEDS Skin   Extensive Assist Verbally Surgical wounds (s/p redo lumbar interbody fusion, 7/22)                       Personal Care Assistance Level of Assistance  Bathing, Dressing, Feeding Bathing Assistance: Maximum assistance Feeding assistance: Independent Dressing Assistance: Maximum assistance     Functional Limitations Info  Sight, Hearing, Speech Sight Info: Adequate Hearing Info:  Adequate Speech Info: Adequate    SPECIAL CARE FACTORS FREQUENCY  PT (By licensed PT), OT (By licensed OT)     PT Frequency: 5x/week, evaluate and treat OT Frequency: 5x/week, evaluate and treatfull            Contractures Contractures Info: Not present    Additional Factors Info  Code Status Code Status Info: full code             Current Medications (03/07/2021):  This is the current hospital active medication list Current Facility-Administered Medications  Medication Dose Route Frequency Provider Last Rate Last Admin   0.9 %  sodium chloride infusion  250 mL Intravenous Continuous Kary Kos, MD 1 mL/hr at 03/04/21 2224 250 mL at 03/04/21 2224   acetaminophen (TYLENOL) tablet 650 mg  650 mg Oral Q4H PRN Kary Kos, MD   650 mg at 03/06/21 1643   Or   acetaminophen (TYLENOL) suppository 650 mg  650 mg Rectal Q4H PRN Kary Kos, MD       albuterol (PROVENTIL) (2.5 MG/3ML) 0.083% nebulizer solution 2.5 mg  2.5 mg Inhalation Q4H PRN Kary Kos, MD       alum & mag hydroxide-simeth (MAALOX/MYLANTA) 200-200-20 MG/5ML suspension 30 mL  30 mL Oral Q6H PRN Kary Kos, MD       Chlorhexidine Gluconate Cloth 2 % PADS 6 each  6 each Topical Daily Kary Kos, MD   6 each at 03/07/21 0921   cyclobenzaprine (FLEXERIL) tablet 5 mg  5 mg Oral TID PRN Kary Kos, MD  5 mg at 03/06/21 1020   diazepam (VALIUM) tablet 5 mg  5 mg Oral Q6H PRN Ashok Pall, MD   5 mg at 03/06/21 1643   docusate sodium (COLACE) capsule 100 mg  100 mg Oral BID Ashok Pall, MD   100 mg at 03/07/21 0901   doxycycline (VIBRA-TABS) tablet 100 mg  100 mg Oral BID Kary Kos, MD   100 mg at 03/07/21 0900   menthol-cetylpyridinium (CEPACOL) lozenge 3 mg  1 lozenge Oral PRN Kary Kos, MD       Or   phenol (CHLORASEPTIC) mouth spray 1 spray  1 spray Mouth/Throat PRN Kary Kos, MD       methocarbamol (ROBAXIN) tablet 750 mg  750 mg Oral Q6H PRN Kary Kos, MD   750 mg at 03/07/21 0432   omega-3 acid ethyl esters  (LOVAZA) capsule 1 g  1 capsule Oral BID PC Kary Kos, MD   1 g at 03/07/21 0922   ondansetron (ZOFRAN) tablet 4 mg  4 mg Oral Q6H PRN Kary Kos, MD       Or   ondansetron [facility]) injection 4 mg  4 mg Intravenous Q6H PRN Kary Kos, MD   4 mg at 03/05/21 0140   oxyCODONE (Oxy IR/ROXICODONE) immediate release tablet 5 mg  5 mg Oral Q4H PRN Ashok Pall, MD   5 mg at 03/07/21 1110   oxyCODONE (OXYCONTIN) 12 hr tablet 15 mg  15 mg Oral Q12H Ashok Pall, MD   15 mg at 03/07/21 0900   pantoprazole (PROTONIX) EC tablet 40 mg  40 mg Oral Daily Darlina Sicilian, RPH   40 mg at 03/07/21 0900   sodium chloride flush (NS) 0.9 % injection 3 mL  3 mL Intravenous Q12H Kary Kos, MD   3 mL at 03/07/21 [id number]   sodium chloride flush (NS) 0.9 % injection 3 mL  3 mL Intravenous PRN Kary Kos, MD       Vitamin D (Ergocalciferol) (DRISDOL) capsule 50,000 Units  50,000 Units Oral Once per day on Mon Thu Cram, Gary, MD   50,000 Units at 03/07/21 0901     Discharge Medications: Please see discharge summary for a list of discharge medications.  Relevant Imaging Results:  Relevant Lab Results:   Additional Information SS# [id number]  Sharin Mons, RN

## 2021-03-07 NOTE — TOC Initial Note (Signed)
Transition of Care [facility]) - Initial/Assessment Note    Patient Details  Name: Julie Obrien MRN: [id number] Date of Birth: 10/30/1950  Transition of Care [facility]) CM/SW Contact:    Sharin Mons, RN Phone Number: 03/07/2021, 11:25 AM  Clinical Narrative:                 Readmitted on 7/22 with  persistent c/o back/ bilateral hip and leg pain. Underwent recent decompressive laminectomy interbody fusion L1-L2 on 7/11. From home with husband ,supportive daughter [name]. With previous admit pt was setup with [facility]  for home health PT/OT.      -s/p redo  lumbar interbody fusion 7/22  RNCM received consult for possible SNF placement at time of discharge. RNCM spoke with patient regarding PT recommendation of SNF placement at time of discharge. Patient reported that patient's spouse works and  is currently unable to care for patient at their home given patient's current physical needs and fall risk. Patient expressed understanding of PT recommendation and is agreeable to SNF placement at time of discharge. Patient reports preference for  [facility] . RNCM discussed insurance authorization process and provided Medicare SNF ratings list. Patient expressed being hopeful for rehab and to feel better soon. No further questions reported at this time. RNCM to continue to follow and assist with discharge planning needs.   Pt fully COVID vaccinated , no booster.  Expected Discharge Plan: Skilled Nursing Facility Barriers to Discharge: Continued Medical Work up   Patient Goals and CMS Choice   CMS Medicare.gov Compare Post Acute Care list provided to:: Patient    Expected Discharge Plan and Services Expected Discharge Plan: [place]   Discharge Planning Services: CM Consult   Living arrangements for the past 2 months: Single Family Home                                      Prior Living Arrangements/Services Living arrangements for the past 2 months: Single  Family Home Lives with:: Spouse Patient language and need for interpreter reviewed:: Yes Do you feel safe going back to the place where you live?: Yes      Need for Family Participation in Patient Care: Yes (Comment) Care giver support system in place?: Yes (comment)   Criminal Activity/Legal Involvement Pertinent to Current Situation/Hospitalization: No - Comment as needed  Activities of Daily Living Home Assistive Devices/Equipment: Eyeglasses, Environmental consultant (specify type), Cane (specify quad or straight), Dentures (specify type) ADL Screening (condition at time of admission) Patient's cognitive ability adequate to safely complete daily activities?: Yes Is the patient deaf or have difficulty hearing?: No Does the patient have difficulty seeing, even when wearing glasses/contacts?: No Does the patient have difficulty concentrating, remembering, or making decisions?: No Patient able to express need for assistance with ADLs?: Yes Does the patient have difficulty dressing or bathing?: No Independently performs ADLs?: Yes (appropriate for developmental age) Does the patient have difficulty walking or climbing stairs?: Yes Weakness of Legs: Right Weakness of Arms/Hands: None  Permission Sought/Granted Permission sought to share information with : Family Supports Permission granted to share information with : Yes, Verbal Permission Granted  Share Information with NAME: Fanta Dr. Luther Parody (Daughter) [phone],  Domingo Cocking Sr. (Spouse) [phone]           Emotional Assessment Appearance:: Appears stated age Attitude/Demeanor/Rapport: Engaged Affect (typically observed): Accepting Orientation: : Oriented to Self, Oriented to Place,  Oriented to  Time, Oriented to Situation Alcohol / Substance Use: Not Applicable Psych Involvement: No (comment)  Admission diagnosis:  Fatigue fracture of vertebra, lumbar region, initial encounter for fracture [id number] Patient Active Problem List    Diagnosis Date Noted   Fatigue fracture of vertebra, lumbar region, initial encounter for fracture 03/04/2021   Adult BMI 32.0-32.9 kg/sq m 10/13/2018   Hypoxia 09/15/2018   Tobacco abuse 09/15/2018   CAP (community acquired pneumonia) 09/15/2018   Influenza A 09/12/2018   COPD exacerbation ([place]) 02/11/2018   Asthma with acute exacerbation 02/10/2018   Spinal stenosis of lumbar region 01/05/2016   Thoracic spine fracture, closed, initial encounter 06/21/2015   Myelopathy, spondylogenic, cervical 05/31/2015   PCP:  Glendale Chard, MD Pharmacy:   [id number] [id number]: [phone]: [phone]    Social Determinants of Health (SDOH) Interventions    Readmission Risk Interventions No flowsheet data found.

## 2021-03-07 NOTE — Care Management Important Message (Signed)
Important Message  Patient Details  Name: Julie Obrien MRN: [id number] Date of Birth: 20-Feb-1951   Medicare Important Message Given:  Yes     Orbie Pyo 03/07/2021, 4:39 PM

## 2021-03-08 ENCOUNTER — Encounter (HOSPITAL_COMMUNITY): Payer: Self-pay | Admitting: Neurosurgery

## 2021-03-08 ENCOUNTER — Inpatient Hospital Stay (HOSPITAL_COMMUNITY): Payer: Medicare Other

## 2021-03-08 MED ORDER — DEXAMETHASONE SODIUM PHOSPHATE 10 MG/ML IJ SOLN
8.0000 mg | Freq: Four times a day (QID) | INTRAMUSCULAR | Status: DC
  Administered 2021-03-08: 8 mg via INTRAVENOUS
  Filled 2021-03-08: qty 1

## 2021-03-08 MED ORDER — DEXAMETHASONE SODIUM PHOSPHATE 10 MG/ML IJ SOLN
8.0000 mg | Freq: Four times a day (QID) | INTRAMUSCULAR | Status: AC
  Administered 2021-03-08 – 2021-03-09 (×3): 8 mg via INTRAVENOUS
  Filled 2021-03-08 (×3): qty 1

## 2021-03-08 MED ORDER — HYDROMORPHONE HCL 1 MG/ML IJ SOLN
0.5000 mg | INTRAMUSCULAR | Status: DC | PRN
  Administered 2021-03-08 – 2021-03-10 (×5): 0.5 mg via INTRAVENOUS
  Filled 2021-03-08 (×5): qty 0.5

## 2021-03-08 NOTE — TOC Progression Note (Addendum)
Transition of Care [facility]) - Progression Note    Patient Details  Name: Julie Obrien MRN: [id number] Date of Birth: 04-May-1951  Transition of Care [facility]) CM/SW Contact  Milinda Antis, [place] Phone Number: 03/08/2021, 8:32 AM  Clinical Narrative:    08:29- CSW contacted admissions at [facility] (SNF) to inquire about bed availability.  There was no answer.  CSW left a VM requesting a returned call.  09:14-  CSW received a message from [facility] that they can extend a bed offer to the patient.   Expected Discharge Plan: [place] Barriers to Discharge: Continued Medical Work up  Expected Discharge Plan and Services Expected Discharge Plan: [place]   Discharge Planning Services: CM Consult   Living arrangements for the past 2 months: Single Family Home                                       Social Determinants of Health (SDOH) Interventions    Readmission Risk Interventions No flowsheet data found.

## 2021-03-08 NOTE — Plan of Care (Signed)
  Problem: Education: Goal: Knowledge of General Education information will improve Description: Including pain rating scale, medication(s)/side effects and non-pharmacologic comfort measures 03/08/2021 2046 by Tobe Sos, RN Outcome: Progressing 03/08/2021 2045 by Tobe Sos, RN Outcome: Progressing   Problem: Health Behavior/Discharge Planning: Goal: Ability to manage health-related needs will improve 03/08/2021 2046 by Tobe Sos, RN Outcome: Progressing 03/08/2021 2045 by Tobe Sos, RN Outcome: Progressing   Problem: Clinical Measurements: Goal: Ability to maintain clinical measurements within normal limits will improve 03/08/2021 2046 by Tobe Sos, RN Outcome: Progressing 03/08/2021 2045 by Tobe Sos, RN Outcome: Progressing Goal: Will remain free from infection 03/08/2021 2046 by Tobe Sos, RN Outcome: Progressing 03/08/2021 2045 by Tobe Sos, RN Outcome: Progressing Goal: Diagnostic test results will improve 03/08/2021 2046 by Tobe Sos, RN Outcome: Progressing 03/08/2021 2045 by Tobe Sos, RN Outcome: Progressing Goal: Respiratory complications will improve 03/08/2021 2046 by Tobe Sos, RN Outcome: Progressing 03/08/2021 2045 by Tobe Sos, RN Outcome: Progressing Goal: Cardiovascular complication will be avoided 03/08/2021 2046 by Tobe Sos, RN Outcome: Progressing 03/08/2021 2045 by Tobe Sos, RN Outcome: Progressing   Problem: Activity: Goal: Risk for activity intolerance will decrease 03/08/2021 2046 by Tobe Sos, RN Outcome: Progressing 03/08/2021 2045 by Tobe Sos, RN Outcome: Progressing   Problem: Nutrition: Goal: Adequate nutrition will be maintained 03/08/2021 2046 by Tobe Sos, RN Outcome: Progressing 03/08/2021 2045 by Tobe Sos, RN Outcome: Progressing   Problem: Coping: Goal: Level of anxiety will decrease 03/08/2021 2046 by Tobe Sos, RN Outcome: Progressing 03/08/2021 2045 by Tobe Sos, RN Outcome: Progressing   Problem: Elimination: Goal: Will not experience complications related to bowel motility 03/08/2021 2046 by Tobe Sos, RN Outcome: Progressing 03/08/2021 2045 by Tobe Sos, RN Outcome: Progressing Goal: Will not experience complications related to urinary retention 03/08/2021 2046 by Tobe Sos, RN Outcome: Progressing 03/08/2021 2045 by Tobe Sos, RN Outcome: Progressing   Problem: Pain Managment: Goal: General experience of comfort will improve 03/08/2021 2046 by Tobe Sos, RN Outcome: Progressing 03/08/2021 2045 by Tobe Sos, RN Outcome: Progressing   Problem: Safety: Goal: Ability to remain free from injury will improve 03/08/2021 2046 by Tobe Sos, RN Outcome: Progressing 03/08/2021 2045 by Tobe Sos, RN Outcome: Progressing   Problem: Skin Integrity: Goal: Risk for impaired skin integrity will decrease 03/08/2021 2046 by Tobe Sos, RN Outcome: Progressing 03/08/2021 2045 by Tobe Sos, RN Outcome: Progressing

## 2021-03-08 NOTE — Progress Notes (Signed)
Subjective: Patient reports  Patient complaining of a lot more left hip anterior quad pain this morning  Objective: Vital signs in last 24 hours: Temp:  [98.4 F (36.9 C)-98.9 F (37.2 C)] 98.4 F (36.9 C) (07/25 1957) Pulse Rate:  [89-91] 89 (07/25 1957) Resp:  [15-18] 15 (07/25 1957) BP: (102-110)/(51-54) 110/54 (07/25 1957) SpO2:  [96 %] 96 % (07/25 1700)  Intake/Output from previous day: 07/25 0701 - 07/26 0700 In: 360 [P.O.:360] Out: -  Intake/Output this shift: No intake/output data recorded.  Strength is 5-5 incision clean dry and intact  Lab Results: No results for input(s): WBC, HGB, HCT, PLT in the last 72 hours. BMET No results for input(s): NA, K, CL, CO2, GLUCOSE, BUN, CREATININE, CALCIUM in the last 72 hours.  Studies/Results: No results found.  Assessment/Plan: Postop day 4 revision of posterior spinal fusion with significant increase in left hip and leg pain this AM.  No particular inciting event last night patient did well yesterday ambulating in the halls will give her some Decadron change some of her pain medication around she was on 2 muscle relaxers so I discontinued the cyclobenzaprine.  We will check some x-rays of her back and continue to work with physical therapy today  LOS: 4 days     Elaina Hoops 03/08/2021, 7:41 AM

## 2021-03-08 NOTE — Plan of Care (Addendum)
Pt. Is becoming more tolerant to activity with pain and is progressing with activity.    Problem: Education: Goal: Knowledge of General Education information will improve Description: Including pain rating scale, medication(s)/side effects and non-pharmacologic comfort measures Outcome: Progressing   Problem: Activity: Goal: Risk for activity intolerance will decrease Outcome: Progressing   Problem: Pain Managment: Goal: General experience of comfort will improve Outcome: Progressing   Problem: Safety: Goal: Ability to remain free from injury will improve Outcome: Progressing   Problem: Skin Integrity: Goal: Risk for impaired skin integrity will decrease Outcome: Progressing

## 2021-03-08 NOTE — Plan of Care (Addendum)
Patient pain is still elevated even with PRNs. Will continue to monitor patient.    Problem: Education: Goal: Knowledge of General Education information will improve Description: Including pain rating scale, medication(s)/side effects and non-pharmacologic comfort measures Outcome: Progressing   Problem: Activity: Goal: Risk for activity intolerance will decrease Outcome: Progressing   Problem: Pain Managment: Goal: General experience of comfort will improve Outcome: Progressing   Problem: Safety: Goal: Ability to remain free from injury will improve Outcome: Progressing   Problem: Skin Integrity: Goal: Risk for impaired skin integrity will decrease Outcome: Progressing

## 2021-03-08 NOTE — Progress Notes (Signed)
Physical Therapy Treatment Patient Details Name: Julie Obrien MRN: [id number] DOB: [birth year] Today's Date: 03/08/2021    History of Present Illness 70 y.o. F admitted to [facility] on 03/05/21 for redo fusion of L1-L2 with extension up to T11 with bilateral pedicle screws placed at T11-T12 and replacing R L1 screw with a larger one. Prior medical history is significant for, but not limited to OA and multiple lumbar surgeries.    PT Comments    Pt seated in recliner.  Pt presents with increased pain and med given during session.  She has had a very difficult time getting pain under control today.  Attempted neural glides in bed but this did not help her pain.  Pt with palpable spasms during session.   Positioned in chair position with pressure of folded pad under L hip to reduce spasm so she can eat her lunch.     Follow Up Recommendations  SNF     Equipment Recommendations  None recommended by PT    Recommendations for Other Services       Precautions / Restrictions Precautions Precautions: Back;Fall Precaution Booklet Issued: No Precaution Comments: pt reported 3/3 precautions Restrictions Weight Bearing Restrictions: No    Mobility  Bed Mobility Overal bed mobility: Needs Assistance Bed Mobility: Rolling;Sit to Sidelying Rolling: Mod assist       Sit to sidelying: Max assist General bed mobility comments: Pt required increased assistance this session.  Pt with increased spasms this session and very reliant on physical assistance from therapist.    Transfers Overall transfer level: Needs assistance Equipment used: Rolling walker (2 wheeled) Transfers: Sit to/from Stand Sit to Stand: Mod assist         General transfer comment: Heavy mod assistance to rise into standing.  Pt required increased time due to severe pain in L hip.  Ambulation/Gait Ambulation/Gait assistance: Min assist Gait Distance (Feet): 8 Feet (from chair to bed across room.  Pt too painful to  increase gt distance.) Assistive device: Rolling walker (2 wheeled) Gait Pattern/deviations: Step-through pattern;Shuffle;Trunk flexed;Narrow base of support Gait velocity: decr   General Gait Details: Cues for sequencing and backing.  Pt very painful this session.   Stairs             Wheelchair Mobility    Modified Rankin (Stroke Patients Only)       Balance Overall balance assessment: Needs assistance Sitting-balance support: Feet supported;Bilateral upper extremity supported;No upper extremity supported Sitting balance-Leahy Scale: Fair       Standing balance-Leahy Scale: Poor Standing balance comment: B UE use.                            Cognition Arousal/Alertness: Awake/alert Behavior During Therapy: WFL for tasks assessed/performed Overall Cognitive Status: Within Functional Limits for tasks assessed                                        Exercises Other Exercises Other Exercises: B SLR x10 pumping ankles 5x at each end range to perform neural glides. Other Exercises: abdominal sets x 10 reps in supine.    General Comments        Pertinent Vitals/Pain Pain Assessment: 0-10 Pain Score: 10-Worst pain ever Pain Location: L hip with palpable spasms Pain Descriptors / Indicators: Grimacing;Guarding Pain Intervention(s): Monitored during session;Repositioned    Home Living  Prior Function            PT Goals (current goals can now be found in the care plan section) Acute Rehab PT Goals Patient Stated Goal: To lessen pain and get stronger Potential to Achieve Goals: Good Progress towards PT goals: Progressing toward goals    Frequency    Min 5X/week      PT Plan Current plan remains appropriate    Co-evaluation              AM-PAC PT "6 Clicks" Mobility   Outcome Measure  Help needed turning from your back to your side while in a flat bed without using bedrails?: A  Little Help needed moving from lying on your back to sitting on the side of a flat bed without using bedrails?: A Little Help needed moving to and from a bed to a chair (including a wheelchair)?: A Little Help needed standing up from a chair using your arms (e.g., wheelchair or bedside chair)?: A Little Help needed to walk in hospital room?: A Little Help needed climbing 3-5 steps with a railing? : A Little 6 Click Score: 18    End of Session Equipment Utilized During Treatment: Gait belt Activity Tolerance: Patient limited by pain Patient left: with call bell/phone within reach;in bed;with bed alarm set Nurse Communication: Mobility status PT Visit Diagnosis: Muscle weakness (generalized) (M62.81);Difficulty in walking, not elsewhere classified (R26.2);Other symptoms and signs involving the nervous system (R29.898);Pain Pain - Right/Left:  (incisional midline) Pain - part of body:  (incisional mideline)     Time: [id number] PT Time Calculation (min) (ACUTE ONLY): 42 min  Charges:  $Gait Training: 8-22 mins $Therapeutic Exercise: 8-22 mins $Therapeutic Activity: 8-22 mins                     Julie Obrien , PTA Acute Rehabilitation Services Pager [phone] Office [phone]    Julie Obrien Eli Hose 03/08/2021, 3:04 PM

## 2021-03-08 NOTE — Progress Notes (Signed)
PT Cancellation Note  Patient Details Name: Julie Obrien MRN: [id number] DOB: 06-14-1951   Cancelled Treatment:    Reason Eval/Treat Not Completed: (P) Pain limiting ability to participate (Pt in intolerable pain in L hip and not able to participate at this time 2 to pain will f/u as pain is better controlled.)   Cristela Blue 03/08/2021, 11:54 AM  [name] , PTA Acute Rehabilitation Services Pager [phone] Office [phone]

## 2021-03-09 MED ORDER — POLYETHYLENE GLYCOL 3350 17 G PO PACK
17.0000 g | PACK | Freq: Every day | ORAL | Status: DC
  Administered 2021-03-09 – 2021-03-14 (×6): 17 g via ORAL
  Filled 2021-03-09 (×6): qty 1

## 2021-03-09 MED ORDER — GABAPENTIN 300 MG PO CAPS
300.0000 mg | ORAL_CAPSULE | Freq: Every day | ORAL | Status: DC
  Administered 2021-03-09 – 2021-03-13 (×5): 300 mg via ORAL
  Filled 2021-03-09 (×5): qty 1

## 2021-03-09 NOTE — Plan of Care (Signed)

## 2021-03-09 NOTE — Consult Note (Signed)
   [facility] Inpatient Consult   03/09/2021  MEHAN STICKA Aug 13, 1951 [id number]  [facility] [ACO] Patient:  Medicare CMS DCE   Patient screened for less than 30 days readmission hospitalization and assess for potential [place] Management service needs for post hospital transition.  Review of patient's medical record reveals patient is currently being recommended for a skilled nursing facility level of care for post hospital transition of care.  Plan:  Continue to follow progress and disposition to assess for post hospital care management needs.  If patient transitions to a [facility] affiliated facility can refer to [facility] for assessment for post facility needs and transition.  For questions contact:   Natividad Brood, RN BSN [facility] Liaison  [phone] business mobile phone Toll free office [phone]  Fax number: [phone] [email or web address]

## 2021-03-09 NOTE — Plan of Care (Signed)
Pain is under control. Patient states she was able to sleep through the night and get a good amount of sleep.   Problem: Education: Goal: Knowledge of General Education information will improve Description: Including pain rating scale, medication(s)/side effects and non-pharmacologic comfort measures Outcome: Progressing   Problem: Activity: Goal: Risk for activity intolerance will decrease Outcome: Progressing   Problem: Pain Managment: Goal: General experience of comfort will improve Outcome: Progressing   Problem: Safety: Goal: Ability to remain free from injury will improve Outcome: Progressing

## 2021-03-09 NOTE — Progress Notes (Signed)
Subjective: Patient reports  feeling better this morning no hip pain just back pain  Objective: Vital signs in last 24 hours: Temp:  [97.4 F (36.3 C)-98.5 F (36.9 C)] 97.4 F (36.3 C) (07/27 0816) Pulse Rate:  [73-83] 73 (07/27 0816) Resp:  [15-17] 17 (07/27 0816) BP: (109-121)/(54-62) 109/54 (07/27 0816) SpO2:  [91 %-100 %] 100 % (07/27 0816)  Intake/Output from previous day: 07/26 0701 - 07/27 0700 In: 480 [P.O.:480] Out: -  Intake/Output this shift: No intake/output data recorded.  Awake alert strength 5 out of 5  Lab Results: No results for input(s): WBC, HGB, HCT, PLT in the last 72 hours. BMET No results for input(s): NA, K, CL, CO2, GLUCOSE, BUN, CREATININE, CALCIUM in the last 72 hours.  Studies/Results: DG Lumbar Spine 2-3 Views  Result Date: 03/08/2021 CLINICAL DATA:  Severe low back pain. Two recent lumbar spine surgeries. EXAM: LUMBAR SPINE - 2-3 VIEW COMPARISON:  Intraoperative lumbar spine radiographs-03/04/2021; lumbar spine CT-03/03/2021; 06/11/2011 FINDINGS: There are 5 non rib-bearing lumbar type vertebral bodies. Post T11-L3 paraspinal fusion with bilateral pedicular screws seen at all levels except L1. Sequela of previous intervertebral disc space replacement of L1-L2, L2-L3, L3-L4, L4-L5 and L5-S1. No definite evidence of hardware failure or loosening. Lumbar vertebral body heights appear preserved. Stigmata of dish within the lower thoracic spine. Bone grafting material is seen bilaterally at the level of the lower lumbar spine. Limited visualization the bilateral SI joints is normal. Regional soft tissues appear normal. IMPRESSION: 1. No acute findings. 2. Extensive postoperative change of the lumbar spine as detailed above without evidence of hardware failure or loosening. Electronically Signed   By: Sandi Mariscal M.D.   On: 03/08/2021 11:38    Assessment/Plan: Postop day 5 revision lumbar fusion doing better continue to mobilize with physical Occupational  Therapy stable for transfer to SNF and rehab when bed becomes available  LOS: 5 days     Elaina Hoops 03/09/2021, 9:32 AM

## 2021-03-09 NOTE — Progress Notes (Signed)
Physical Therapy Treatment Patient Details Name: Julie Obrien MRN: [id number] DOB: March 06, 1951 Today's Date: 03/09/2021    History of Present Illness 70 y.o. F admitted to [facility] on 03/05/21 for redo fusion of L1-L2 with extension up to T11 with bilateral pedicle screws placed at T11-T12 and replacing R L1 screw with a larger one. Prior medical history is significant for, but not limited to OA and multiple lumbar surgeries.    PT Comments    Pt very limited this session as spasm present this pm.  She did have a good session with OT this am but kicked into spasm this pm.  Opted to focus on LE strengthening but unable to tolerate more than a few exercises.  Spent better part of the session positioning patient in supine to alleviate pain in L buttock/hip.  Placed folded pad under patient buttock with hot pack.  Will continue to recommend snf at this time until pain and spasm is better controlled.      Follow Up Recommendations  SNF     Equipment Recommendations  None recommended by PT    Recommendations for Other Services       Precautions / Restrictions Precautions Precautions: Back;Fall Precaution Booklet Issued: Yes (comment) Precaution Comments: pt well versed in back precautions from previous surgeries Restrictions Weight Bearing Restrictions: No    Mobility  Bed Mobility Overal bed mobility: Modified Independent Bed Mobility: Rolling           General bed mobility comments: Performed rolling to R side and back but limited due to spasm in L hip/buttock. Perfomed boost in supine and positioned to alleviate pain in L hip.      Transfers Overall transfer level: Needs assistance               General transfer comment: unable to progress to edge of bed or OOB this session.  Ambulation/Gait                 Stairs             Wheelchair Mobility    Modified Rankin (Stroke Patients Only)       Balance                                             Cognition Arousal/Alertness: Awake/alert Behavior During Therapy: WFL for tasks assessed/performed Overall Cognitive Status: Within Functional Limits for tasks assessed                                        Exercises General Exercises - Lower Extremity Ankle Circles/Pumps: AROM;Both;20 reps;Supine Quad Sets: AROM;Both;10 reps;Supine (could not perform bilaterally due to pain but could perform unilateral.)    General Comments        Pertinent Vitals/Pain Pain Assessment: 0-10 Pain Score: 10-Worst pain ever Pain Location: L hip. Pain Descriptors / Indicators: Grimacing;Guarding;Spasm Pain Intervention(s): Monitored during session;Repositioned    Home Living                      Prior Function            PT Goals (current goals can now be found in the care plan section) Acute Rehab PT Goals Patient Stated Goal: To lessen pain and get stronger Potential to  Achieve Goals: Good Progress towards PT goals: Progressing toward goals    Frequency    Min 5X/week      PT Plan Current plan remains appropriate    Co-evaluation              AM-PAC PT "6 Clicks" Mobility   Outcome Measure  Help needed turning from your back to your side while in a flat bed without using bedrails?: A Little Help needed moving from lying on your back to sitting on the side of a flat bed without using bedrails?: A Little Help needed moving to and from a bed to a chair (including a wheelchair)?: A Little Help needed standing up from a chair using your arms (e.g., wheelchair or bedside chair)?: A Little Help needed to walk in hospital room?: A Little Help needed climbing 3-5 steps with a railing? : A Little 6 Click Score: 18    End of Session   Activity Tolerance: Patient limited by pain Patient left: with call bell/phone within reach;in bed;with bed alarm set Nurse Communication: Mobility status PT Visit Diagnosis: Muscle weakness  (generalized) (M62.81);Difficulty in walking, not elsewhere classified (R26.2);Other symptoms and signs involving the nervous system (R29.898);Pain     Time: [id number] PT Time Calculation (min) (ACUTE ONLY): 20 min  Charges:  $Therapeutic Activity: 8-22 mins                     [name] , PTA Acute Rehabilitation Services Pager [phone] Office [phone]    Stacy Deshler Eli Hose 03/09/2021, 4:07 PM

## 2021-03-09 NOTE — Progress Notes (Signed)
Occupational Therapy Treatment Patient Details Name: ASHTYNN SCHNELL MRN: [id number] DOB: 09/03/1950 Today's Date: 03/09/2021    History of present illness 70 y.o. F admitted to [facility] on 03/05/21 for redo fusion of L1-L2 with extension up to T11 with bilateral pedicle screws placed at T11-T12 and replacing R L1 screw with a larger one. Prior medical history is significant for, but not limited to OA and multiple lumbar surgeries.   OT comments  Pt requiring min guard assist and RW for OOB. Stood at sink for 10 minutes while completing grooming activities. Used BSC over toilet, assist needed for posterior pericare. Pt reporting 5/10 pain in back.   Follow Up Recommendations  SNF;Supervision/Assistance - 24 hour    Equipment Recommendations  None recommended by OT    Recommendations for Other Services      Precautions / Restrictions Precautions Precautions: Back;Fall Precaution Booklet Issued: Yes (comment) Precaution Comments: pt well versed in back precautions from previous surgeries       Mobility Bed Mobility Overal bed mobility: Modified Independent             General bed mobility comments: use of rail, increased time    Transfers Overall transfer level: Needs assistance Equipment used: Rolling walker (2 wheeled) Transfers: Sit to/from Stand Sit to Stand: Min guard         General transfer comment: cue for hand placement, slow to rise and descend due to back pain    Balance Overall balance assessment: Needs assistance   Sitting balance-Leahy Scale: Fair       Standing balance-Leahy Scale: Poor Standing balance comment: can release walker in static standing,dependent for ambulation                           ADL either performed or assessed with clinical judgement   ADL Overall ADL's : Needs assistance/impaired     Grooming: Wash/dry hands;Wash/dry face;Oral care;Min guard;Standing           Upper Body Dressing : Set up;Sitting        Toilet Transfer: Min guard;Ambulation;RW Toilet Transfer Details (indicate cue type and reason): BSC over toilet Toileting- Clothing Manipulation and Hygiene: Moderate assistance Toileting - Clothing Manipulation Details (indicate cue type and reason): assist for posterior pericare     Functional mobility during ADLs: Min guard;Rolling walker       Vision       Perception     Praxis      Cognition Arousal/Alertness: Awake/alert Behavior During Therapy: WFL for tasks assessed/performed Overall Cognitive Status: Within Functional Limits for tasks assessed                                          Exercises     Shoulder Instructions       General Comments      Pertinent Vitals/ Pain       Pain Assessment: 0-10 Pain Score: 5  Pain Location: back Pain Descriptors / Indicators: Grimacing;Guarding Pain Intervention(s): Monitored during session;Premedicated before session;Repositioned  Home Living                                          Prior Functioning/Environment  Frequency           Progress Toward Goals  OT Goals(current goals can now be found in the care plan section)  Progress towards OT goals: Progressing toward goals  Acute Rehab OT Goals Patient Stated Goal: To lessen pain and get stronger OT Goal Formulation: With patient Time For Goal Achievement: 03/19/21 Potential to Achieve Goals: Good  Plan Discharge plan remains appropriate    Co-evaluation                 AM-PAC OT "6 Clicks" Daily Activity     Outcome Measure   Help from another person eating meals?: None Help from another person taking care of personal grooming?: A Little Help from another person toileting, which includes using toliet, bedpan, or urinal?: A Lot Help from another person bathing (including washing, rinsing, drying)?: A Little Help from another person to put on and taking off regular upper body clothing?: A  Little Help from another person to put on and taking off regular lower body clothing?: A Lot 6 Click Score: 17    End of Session Equipment Utilized During Treatment: Gait belt;Rolling walker  OT Visit Diagnosis: Unsteadiness on feet (R26.81);Muscle weakness (generalized) (M62.81);Pain   Activity Tolerance Patient tolerated treatment well   Patient Left in chair;with call bell/phone within reach;with chair alarm set   Nurse Communication          Time: [phone] OT Time Calculation (min): 33 min  Charges: OT General Charges $OT Visit: 1 Visit OT Treatments $Self Care/Home Management : 23-37 mins  Nestor Lewandowsky, OTR/L Acute Rehabilitation Services Pager: [phone] Office: [phone]    Malka So 03/09/2021, 10:34 AM

## 2021-03-10 LAB — CBC WITH DIFFERENTIAL/PLATELET
Abs Immature Granulocytes: 0.07 10*3/uL (ref 0.00–0.07)
Basophils Absolute: 0 10*3/uL (ref 0.0–0.1)
Basophils Relative: 0 %
Eosinophils Absolute: 0 10*3/uL (ref 0.0–0.5)
Eosinophils Relative: 0 %
HCT: 28.2 % — ABNORMAL LOW (ref 36.0–46.0)
Hemoglobin: 9.1 g/dL — ABNORMAL LOW (ref 12.0–15.0)
Immature Granulocytes: 1 %
Lymphocytes Relative: 18 %
Lymphs Abs: 2.8 10*3/uL (ref 0.7–4.0)
MCH: 26.8 pg (ref 26.0–34.0)
MCHC: 32.3 g/dL (ref 30.0–36.0)
MCV: 83.2 fL (ref 80.0–100.0)
Monocytes Absolute: 0.7 10*3/uL (ref 0.1–1.0)
Monocytes Relative: 4 %
Neutro Abs: 12 10*3/uL — ABNORMAL HIGH (ref 1.7–7.7)
Neutrophils Relative %: 77 %
Platelets: 402 10*3/uL — ABNORMAL HIGH (ref 150–400)
RBC: 3.39 MIL/uL — ABNORMAL LOW (ref 3.87–5.11)
RDW: 15.1 % (ref 11.5–15.5)
WBC: 15.6 10*3/uL — ABNORMAL HIGH (ref 4.0–10.5)
nRBC: 0 % (ref 0.0–0.2)

## 2021-03-10 LAB — BASIC METABOLIC PANEL
Anion gap: 8 (ref 5–15)
BUN: 15 mg/dL (ref 8–23)
CO2: 28 mmol/L (ref 22–32)
Calcium: 9.2 mg/dL (ref 8.9–10.3)
Chloride: 98 mmol/L (ref 98–111)
Creatinine, Ser: 0.67 mg/dL (ref 0.44–1.00)
GFR, Estimated: >60 mL/min (ref >60)
Glucose, Bld: 99 mg/dL (ref 70–99)
Potassium: 4.3 mmol/L (ref 3.5–5.1)
Sodium: 134 mmol/L — ABNORMAL LOW (ref 135–145)

## 2021-03-10 NOTE — Progress Notes (Signed)
Rehab Admissions Coordinator Note:  Patient was screened by Cleatrice Burke for appropriateness for an Inpatient Acute Rehab Consult per change in therapy recommendations. I discussed case with Dr Naaman Plummer of CIR and there is no medical neccesity for intensive rehab in acute hospital rehab. She is not a CIR candidate. Other rehab venue options need to be pursued.  Cleatrice Burke RN MSN 03/10/2021, 12:04 PM  I can be reached at [phone].

## 2021-03-10 NOTE — Plan of Care (Signed)

## 2021-03-10 NOTE — Plan of Care (Signed)

## 2021-03-10 NOTE — Progress Notes (Addendum)
Physical Therapy Treatment Patient Details Name: Julie Obrien MRN: [id number] DOB: 1951-01-15 Today's Date: 03/10/2021    History of Present Illness 70 y.o. F admitted to [facility] on 03/05/21 for redo fusion of L1-L2 with extension up to T11 with bilateral pedicle screws placed at T11-T12 and replacing R L1 screw with a larger one. Prior medical history is significant for, but not limited to OA and multiple lumbar surgeries.    PT Comments    Pt supine in bed.  Husband at bed side.  Had lengthy discussion about post acute rehab.  Pt now refusing SNF placement.  Updated recommendations to CIR if she qualifies.  If she does not qualify will require HHPT, hospital bed and ambulance transport home.    Will inform supervising PT of need for change in recommendations at this time.    Follow Up Recommendations  CIR (Pt adamantly refusing SNF placement, if denied CIR placement will require HHPT.)     Equipment Recommendations  Hospital bed (ambulance transport home.)    Recommendations for Other Services       Precautions / Restrictions Precautions Precautions: Back;Fall Precaution Booklet Issued: Yes (comment) Precaution Comments: pt well versed in back precautions from previous surgeries Restrictions Weight Bearing Restrictions: No    Mobility  Bed Mobility Overal bed mobility: Modified Independent Bed Mobility: Rolling;Sidelying to Sit Rolling: Min guard Sidelying to sit: Min assist     Sit to sidelying: Mod assist General bed mobility comments: Pt required decreased assistance to move into sitting at edge of bed.  Pt continues to require increased assistance to return back to bed.    Transfers Overall transfer level: Needs assistance Equipment used: Rolling walker (2 wheeled) Transfers: Sit to/from Stand Sit to Stand: Min guard         General transfer comment: Cues for hand placement to and from seated surface this session.  Ambulation/Gait Ambulation/Gait  assistance: Min guard Gait Distance (Feet): 100 Feet Assistive device: Rolling walker (2 wheeled) Gait Pattern/deviations: Step-through pattern;Shuffle;Trunk flexed;Narrow base of support Gait velocity: decr   General Gait Details: Cues for pacing and trunk control.  Gt speed very slow which increases risks for falls, however no LOB and heavy reliance on RW.   Stairs             Wheelchair Mobility    Modified Rankin (Stroke Patients Only)       Balance Overall balance assessment: Needs assistance Sitting-balance support: Feet supported;Bilateral upper extremity supported;No upper extremity supported Sitting balance-Leahy Scale: Fair       Standing balance-Leahy Scale: Poor Standing balance comment: can release walker in static standing,dependent for ambulation                            Cognition Arousal/Alertness: Awake/alert Behavior During Therapy: WFL for tasks assessed/performed Overall Cognitive Status: Within Functional Limits for tasks assessed                                        Exercises      General Comments        Pertinent Vitals/Pain Pain Assessment: 0-10 Pain Score: 6  Pain Location: L hip. Pain Descriptors / Indicators: Grimacing;Guarding;Spasm Pain Intervention(s): Monitored during session;Repositioned    Home Living  Prior Function            PT Goals (current goals can now be found in the care plan section) Acute Rehab PT Goals Patient Stated Goal: To lessen pain and get stronger Potential to Achieve Goals: Good Progress towards PT goals: Progressing toward goals    Frequency    Min 5X/week      PT Plan Current plan remains appropriate    Co-evaluation              AM-PAC PT "6 Clicks" Mobility   Outcome Measure  Help needed turning from your back to your side while in a flat bed without using bedrails?: A Little Help needed moving from lying on your  back to sitting on the side of a flat bed without using bedrails?: A Little Help needed moving to and from a bed to a chair (including a wheelchair)?: A Little Help needed standing up from a chair using your arms (e.g., wheelchair or bedside chair)?: A Little Help needed to walk in hospital room?: A Little Help needed climbing 3-5 steps with a railing? : A Little 6 Click Score: 18    End of Session Equipment Utilized During Treatment: Gait belt Activity Tolerance: Patient limited by pain Patient left: with call bell/phone within reach;in bed;with bed alarm set (in chair position.) Nurse Communication: Mobility status PT Visit Diagnosis: Muscle weakness (generalized) (M62.81);Difficulty in walking, not elsewhere classified (R26.2);Other symptoms and signs involving the nervous system (R29.898);Pain Pain - Right/Left:  (incisional midline)     Time: [id number] PT Time Calculation (min) (ACUTE ONLY): 33 min  Charges:  $Gait Training: 8-22 mins $Therapeutic Activity: 8-22 mins                     [name] , PTA Acute Rehabilitation Services Pager [phone] Office [phone]    Julie Obrien Eli Hose 03/10/2021, 11:34 AM

## 2021-03-10 NOTE — Progress Notes (Signed)
Subjective: Patient reports  little more back pain but global behind in her medication yesterday sitting in the chair for extracted prolonged period of time denies any leg pain  Objective: Vital signs in last 24 hours: Temp:  [97.4 F (36.3 C)-98 F (36.7 C)] 97.6 F (36.4 C) (07/28 0723) Pulse Rate:  [70-75] 70 (07/28 0723) Resp:  [17-18] 18 (07/27 1715) BP: (101-112)/(54-63) 101/59 (07/28 0723) SpO2:  [97 %-100 %] 97 % (07/28 0723)  Intake/Output from previous day: 07/27 0701 - 07/28 0700 In: 240 [P.O.:240] Out: -  Intake/Output this shift: No intake/output data recorded.  Patient is awake alert strength is 5 of 5 incision is clean dry and intact  Lab Results: No results for input(s): WBC, HGB, HCT, PLT in the last 72 hours. BMET No results for input(s): NA, K, CL, CO2, GLUCOSE, BUN, CREATININE, CALCIUM in the last 72 hours.  Studies/Results: DG Lumbar Spine 2-3 Views  Result Date: 03/08/2021 CLINICAL DATA:  Severe low back pain. Two recent lumbar spine surgeries. EXAM: LUMBAR SPINE - 2-3 VIEW COMPARISON:  Intraoperative lumbar spine radiographs-03/04/2021; lumbar spine CT-03/03/2021; 06/11/2011 FINDINGS: There are 5 non rib-bearing lumbar type vertebral bodies. Post T11-L3 paraspinal fusion with bilateral pedicular screws seen at all levels except L1. Sequela of previous intervertebral disc space replacement of L1-L2, L2-L3, L3-L4, L4-L5 and L5-S1. No definite evidence of hardware failure or loosening. Lumbar vertebral body heights appear preserved. Stigmata of dish within the lower thoracic spine. Bone grafting material is seen bilaterally at the level of the lower lumbar spine. Limited visualization the bilateral SI joints is normal. Regional soft tissues appear normal. IMPRESSION: 1. No acute findings. 2. Extensive postoperative change of the lumbar spine as detailed above without evidence of hardware failure or loosening. Electronically Signed   By: Sandi Mariscal M.D.   On:  03/08/2021 11:38    Assessment/Plan: Postop day 6 revision of spinal fusion little more pain this morning but neurologically stable we will continue to mobilize with physical therapy told her to try to keep up with her pain medication and muscle relaxers check a BMP and CBC.  LOS: 6 days     Julie Obrien 03/10/2021, 7:54 AM

## 2021-03-11 NOTE — Progress Notes (Signed)
Subjective: Patient reports  feeling better this morning condition of back pain but legs are doing okay  Objective: Vital signs in last 24 hours: Temp:  [97.7 F (36.5 C)-98.4 F (36.9 C)] 98 F (36.7 C) (07/29 1221) Pulse Rate:  [70-80] 79 (07/29 1221) Resp:  [15-20] 20 (07/29 1221) BP: (104-122)/(50-58) 122/53 (07/29 1221) SpO2:  [92 %-98 %] 92 % (07/29 1221)  Intake/Output from previous day: 07/28 0701 - 07/29 0700 In: 243 [P.O.:240; I.V.:3] Out: -  Intake/Output this shift: No intake/output data recorded.  Strength 5/5 wound clean dry and intact  Lab Results: Recent Labs    03/10/21 0810  WBC 15.6*  HGB 9.1*  HCT 28.2*  PLT 402*   BMET Recent Labs    03/10/21 0810  NA 134*  K 4.3  CL 98  CO2 28  GLUCOSE 99  BUN 15  CREATININE 0.67  CALCIUM 9.2    Studies/Results: No results found.  Assessment/Plan: Postop day 7 revision spinal fusion patient is stable to mobilize there is some confusion as to whether the patient refused skilled nursing facility or whether she is eligible for home health I asked the nurse to get that social worker and case manager to talk to her again and find out what is most appropriate for her I am fine with either 1.  We will discontinue her central line  LOS: 7 days     Elaina Hoops 03/11/2021, 1:38 PM

## 2021-03-11 NOTE — TOC Progression Note (Signed)
Transition of Care [facility]) - Progression Note    Patient Details  Name: Julie Obrien MRN: [id number] Date of Birth: 1951/02/10  Transition of Care [facility]) CM/SW [place], [place] Phone Number: 03/11/2021, 3:22 PM  Clinical Narrative:    15:15-  CSW spoke with [facility] and was informed that the patient is now wanting to go to SNF.  CSW called [facility] to inquire about whether the bed was still available.  There was no answer.  CSW left a VM requesting a returned call.     Expected Discharge Plan: [place] (declined SNF placement) Barriers to Discharge: Continued Medical Work up  Expected Discharge Plan and Services Expected Discharge Plan: [place] (declined SNF placement)   Discharge Planning Services: CM Consult Post Acute Care Choice: [place] arrangements for the past 2 months: Single Family Home                 DME Arranged: Hospital bed DME Agency: [employer] Date DME Agency Contacted: 03/11/21 Time DME Agency Contacted: 1410 Representative spoke with at DME Agency: Freda Munro( voice message left) HH Arranged: PT, OT   Date HH Agency Contacted: 03/11/21 Time [place]: [phone] Representative spoke with at [place]: Amy   Social Determinants of Health ([place]) Interventions    Readmission Risk Interventions No flowsheet data found.

## 2021-03-11 NOTE — Progress Notes (Signed)
Occupational Therapy Treatment Patient Details Name: DELAILA GIARRAPUTO MRN: [id number] DOB: 1951-04-13 Today's Date: 03/11/2021    History of present illness 70 y.o. F admitted to [facility] on 03/05/21 for redo fusion of L1-L2 with extension up to T11 with bilateral pedicle screws placed at T11-T12 and replacing R L1 screw with a larger one. Prior medical history is significant for, but not limited to OA and multiple lumbar surgeries.   OT comments  Pt limited this session due to intense pain and back spasms. She was only able to complete bed level grooming and minimal rolling/repositioning in bed to attempt to relieve spasms. RN notified and provided pain meds during session. OT will continue to follow up acutely.    Follow Up Recommendations  SNF;Supervision/Assistance - 24 hour    Equipment Recommendations  None recommended by OT    Recommendations for Other Services      Precautions / Restrictions Precautions Precautions: Back;Fall Precaution Booklet Issued: Yes (comment) Precaution Comments: pt well versed in back precautions from previous surgeries Required Braces or Orthoses: Spinal Brace Spinal Brace: Lumbar corset Restrictions Weight Bearing Restrictions: No       Mobility Bed Mobility Overal bed mobility: Needs Assistance Bed Mobility: Rolling Rolling: Mod assist         General bed mobility comments: Pt limited due to intense pain    Transfers                 General transfer comment: Unable to transfer due to pain this session    Balance                                           ADL either performed or assessed with clinical judgement   ADL Overall ADL's : Needs assistance/impaired     Grooming: Wash/dry hands;Wash/dry face;Oral care;Set up;Bed level                                 General ADL Comments: Limited this session to bed mobility, repositioning, and grooming in bed due to intense back spasms      Vision   Vision Assessment?: No apparent visual deficits   Perception     Praxis      Cognition Arousal/Alertness: Awake/alert Behavior During Therapy: WFL for tasks assessed/performed Overall Cognitive Status: Within Functional Limits for tasks assessed                                          Exercises     Shoulder Instructions       General Comments Pt in intense pain, crying, RN provided meds mid session.    Pertinent Vitals/ Pain       Pain Assessment: Faces Faces Pain Scale: Hurts worst Pain Location: Low back Pain Descriptors / Indicators: Crying;Grimacing;Guarding;Aching;Shooting;Spasm Pain Intervention(s): Limited activity within patient's tolerance;RN gave pain meds during session;Repositioned  Home Living                                          Prior Functioning/Environment              Frequency  Min  2X/week        Progress Toward Goals  OT Goals(current goals can now be found in the care plan section)  Progress towards OT goals: Not progressing toward goals - comment  Acute Rehab OT Goals Patient Stated Goal: To lessen pain and get stronger OT Goal Formulation: With patient Time For Goal Achievement: 03/19/21 Potential to Achieve Goals: Good ADL Goals Pt Will Perform Grooming: with supervision;standing Pt Will Perform Lower Body Bathing: with supervision;with adaptive equipment;sit to/from stand;sitting/lateral leans Pt Will Perform Upper Body Dressing: with modified independence Pt Will Perform Lower Body Dressing: with supervision;with adaptive equipment;sitting/lateral leans;sit to/from stand Pt Will Transfer to Toilet: with supervision;ambulating Pt Will Perform Toileting - Clothing Manipulation and hygiene: with supervision;sitting/lateral leans;sit to/from stand;with adaptive equipment  Plan Discharge plan remains appropriate    Co-evaluation                 AM-PAC OT "6 Clicks"  Daily Activity     Outcome Measure   Help from another person eating meals?: None Help from another person taking care of personal grooming?: A Little Help from another person toileting, which includes using toliet, bedpan, or urinal?: A Lot Help from another person bathing (including washing, rinsing, drying)?: A Little Help from another person to put on and taking off regular upper body clothing?: A Little Help from another person to put on and taking off regular lower body clothing?: A Lot 6 Click Score: 17    End of Session    OT Visit Diagnosis: Unsteadiness on feet (R26.81);Muscle weakness (generalized) (M62.81);Pain Pain - part of body:  (back)   Activity Tolerance Patient tolerated treatment well   Patient Left in bed;with call bell/phone within reach   Nurse Communication Patient requests pain meds        Time: [id number] OT Time Calculation (min): 9 min  Charges: OT General Charges $OT Visit: 1 Visit OT Treatments $Self Care/Home Management : 8-22 mins  Teague Goynes H., OTR/L Acute Rehabilitation  Lanika Colgate Elane Yolanda Bonine 03/11/2021, 4:59 PM

## 2021-03-11 NOTE — Progress Notes (Addendum)
Physical Therapy Treatment Patient Details Name: Julie Obrien MRN: [id number] DOB: 1951/03/02 Today's Date: 03/11/2021    History of Present Illness 70 y.o. F admitted to [facility] on 03/05/21 for redo fusion of L1-L2 with extension up to T11 with bilateral pedicle screws placed at T11-T12 and replacing R L1 screw with a larger one. Prior medical history is significant for, but not limited to OA and multiple lumbar surgeries.    PT Comments    Pt continues to improve, mild spasm noted in R hip this session but did not debilitate her mobility.  Pt seated edge of bed eating breakfast post session.  Plan for HHPT ( due to CIR denial and refusal of SNF placement ), ambulance transport, and hospital bed for home.      Follow Up Recommendations  Home health PT     Equipment Recommendations  Hospital bed (ambulance transport home)    Recommendations for Other Services       Precautions / Restrictions Precautions Precautions: Back;Fall Precaution Booklet Issued: Yes (comment) Precaution Comments: pt well versed in back precautions from previous surgeries Spinal Brace: Lumbar corset (applied in sitting for comfort no orders to wear it - from a previous surgery) Restrictions Weight Bearing Restrictions: No    Mobility  Bed Mobility Overal bed mobility: Needs Assistance Bed Mobility: Rolling;Sidelying to Sit Rolling: Min guard Sidelying to sit: Min guard       General bed mobility comments: Pt able to move to edge of bed this session with decreased assistance.  She remains slow and guarded due to pain.    Transfers Overall transfer level: Needs assistance Equipment used: Rolling walker (2 wheeled) Transfers: Sit to/from Stand Sit to Stand: Min guard;Min assist         General transfer comment: Slow to rise due to pain but able to performed from elevated bed,  required min assistance from lower seated surface.  Ambulation/Gait Ambulation/Gait assistance: Min guard Gait  Distance (Feet): 220 Feet Assistive device: Rolling walker (2 wheeled) Gait Pattern/deviations: Step-through pattern;Shuffle;Trunk flexed;Narrow base of support Gait velocity: decr   General Gait Details: Cues for pacing and trunk control.  Gt speed continues to be very slow which increases risks for falls, however no LOB and heavy reliance on RW.   Stairs             Wheelchair Mobility    Modified Rankin (Stroke Patients Only)       Balance Overall balance assessment: Needs assistance   Sitting balance-Leahy Scale: Fair       Standing balance-Leahy Scale: Poor Standing balance comment: can release walker in static standing,dependent for ambulation                            Cognition Arousal/Alertness: Awake/alert Behavior During Therapy: WFL for tasks assessed/performed Overall Cognitive Status: Within Functional Limits for tasks assessed                                        Exercises      General Comments        Pertinent Vitals/Pain Pain Assessment: 0-10 Pain Score: 6  Pain Location: R hip this session Pain Descriptors / Indicators: Grimacing;Guarding;Spasm (reports spasm but mild compared to previous encounters.)    Home Living  Prior Function            PT Goals (current goals can now be found in the care plan section) Acute Rehab PT Goals Patient Stated Goal: To lessen pain and get stronger Potential to Achieve Goals: Good Progress towards PT goals: Progressing toward goals    Frequency    Min 5X/week      PT Plan Discharge plan needs to be updated    Co-evaluation              AM-PAC PT "6 Clicks" Mobility   Outcome Measure  Help needed turning from your back to your side while in a flat bed without using bedrails?: A Little Help needed moving from lying on your back to sitting on the side of a flat bed without using bedrails?: A Little Help needed moving to and  from a bed to a chair (including a wheelchair)?: A Little Help needed standing up from a chair using your arms (e.g., wheelchair or bedside chair)?: A Little Help needed to walk in hospital room?: A Little Help needed climbing 3-5 steps with a railing? : A Little 6 Click Score: 18    End of Session Equipment Utilized During Treatment: Gait belt Activity Tolerance: Patient limited by pain Patient left: with call bell/phone within reach;in bed;with bed alarm set Nurse Communication: Mobility status PT Visit Diagnosis: Muscle weakness (generalized) (M62.81);Difficulty in walking, not elsewhere classified (R26.2);Other symptoms and signs involving the nervous system (R29.898);Pain Pain - Right/Left:  (incisional midline)     Time: [id number] PT Time Calculation (min) (ACUTE ONLY): 30 min  Charges:  $Gait Training: 8-22 mins $Therapeutic Activity: 8-22 mins                     [name] , PTA Acute Rehabilitation Services Pager [phone] Office [phone]    Julie Obrien 03/11/2021, 9:28 AM

## 2021-03-11 NOTE — Progress Notes (Signed)
CVC removed per order. No complications, patient instructed to remain flat until 1600, and leave dressing in place for minimum of 24 hours, verbalizes understanding.

## 2021-03-11 NOTE — TOC Progression Note (Addendum)
Transition of Care [facility]) - Progression Note    Patient Details  Name: Julie Obrien MRN: [id number] Date of Birth: 21-Jul-1951  [facility] CM/SW Contact  Sharin Mons, RN Phone Number: 03/11/2021, 2:15 PM  Clinical Narrative:    Pt without medical necessity for CIR and is aware.. Pt and  daughter  declined SNF placement. Pt would like to d/c to home and is agreeable with the resumption of home health services provided by [facility].  Per daughter she will arrange help for pt while husband is @ work.  Pt states bedroom upstairs and is requesting a hospital bed for d/c. States with first surgery it was a lot going up the stairs and feels that played a part in returning to hospital, and redo surgery. Order received for DME : hospital bed...referral made with [employer].  TOC team monitoring and will assist with needs....  Expected Discharge Plan: [place] (declined SNF placement) Barriers to Discharge: Continued Medical Work up  Expected Discharge Plan and Services Expected Discharge Plan: [place] (declined SNF placement)   Discharge Planning Services: CM Consult Post Acute Care Choice: [place] arrangements for the past 2 months: Single Family Home                 DME Arranged: Hospital bed DME Agency: [employer] Date DME Agency Contacted: 03/11/21 Time DME Agency Contacted: 1410 Representative spoke with at DME Agency: Freda Munro( voice message left) HH Arranged: PT, OT   Date HH Agency Contacted: 03/11/21 Time [place]: [id number] Representative spoke with at [place]: Amy   Social Determinants of Health ([place]) Interventions    Readmission Risk Interventions No flowsheet data found.

## 2021-03-12 NOTE — Progress Notes (Signed)
Physical Therapy Treatment Patient Details Name: Julie Obrien MRN: [id number] DOB: March 16, 1951 Today's Date: 03/12/2021    History of Present Illness 70 y.o. F admitted to [facility] on 03/05/21 for redo fusion of L1-L2 with extension up to T11 with bilateral pedicle screws placed at T11-T12 and replacing R L1 screw with a larger one. Prior medical history is significant for, but not limited to OA and multiple lumbar surgeries.    PT Comments    Pt supine in bed on arrival this session.  Pt with limited activity this session and requiring increased assistance.  Pt now reports she is receptive to SNF placement as she feels she lacks support at home for her recovery.  Will update recommendations to reflect this.  If she does not qualify for snf placement she will require HHPT and DME listed below.     Follow Up Recommendations  SNF     Equipment Recommendations  Hospital bed (ambulance transport home.)    Recommendations for Other Services       Precautions / Restrictions Precautions Precautions: Back;Fall Precaution Booklet Issued: Yes (comment) Precaution Comments: pt well versed in back precautions from previous surgeries Required Braces or Orthoses: Spinal Brace Spinal Brace: Lumbar corset;Applied in sitting position Restrictions Weight Bearing Restrictions: No    Mobility  Bed Mobility Overal bed mobility: Needs Assistance Bed Mobility: Rolling Rolling: Min guard Sidelying to sit: Min guard       General bed mobility comments: Cues for sequencing and safety    Transfers Overall transfer level: Needs assistance Equipment used: Rolling walker (2 wheeled) Transfers: Sit to/from Stand Sit to Stand: Min assist         General transfer comment: Increased time to rise into standing with increased assistance.  Ambulation/Gait Ambulation/Gait assistance: Min assist Gait Distance (Feet): 80 Feet (decreased distance due to pain.) Assistive device: Rolling walker (2  wheeled) Gait Pattern/deviations: Shuffle;Trunk flexed;Narrow base of support;Step-to pattern;Decreased dorsiflexion - right;Decreased dorsiflexion - left Gait velocity: decr   General Gait Details: Cues for sequencing this session. Pt required cues to increase stride and to perform B heel strike.  Gt speed slower today due to increase radiating pain in Left hip down thigh.   Stairs             Wheelchair Mobility    Modified Rankin (Stroke Patients Only)       Balance Overall balance assessment: Needs assistance Sitting-balance support: Feet supported;Bilateral upper extremity supported;No upper extremity supported Sitting balance-Leahy Scale: Fair       Standing balance-Leahy Scale: Poor                              Cognition Arousal/Alertness: Awake/alert Behavior During Therapy: WFL for tasks assessed/performed Overall Cognitive Status: Within Functional Limits for tasks assessed                                        Exercises      General Comments        Pertinent Vitals/Pain Pain Assessment: 0-10 Pain Score: 5  Pain Location: L hip radiating down. Pain Descriptors / Indicators: Discomfort;Grimacing;Guarding Pain Intervention(s): Monitored during session;Repositioned    Home Living                      Prior Function  PT Goals (current goals can now be found in the care plan section) Acute Rehab PT Goals Patient Stated Goal: To lessen pain and get stronger Potential to Achieve Goals: Good Progress towards PT goals: Progressing toward goals    Frequency    Min 5X/week      PT Plan Discharge plan needs to be updated    Co-evaluation              AM-PAC PT "6 Clicks" Mobility   Outcome Measure  Help needed turning from your back to your side while in a flat bed without using bedrails?: A Little Help needed moving from lying on your back to sitting on the side of a flat bed without  using bedrails?: A Little Help needed moving to and from a bed to a chair (including a wheelchair)?: A Little Help needed standing up from a chair using your arms (e.g., wheelchair or bedside chair)?: A Little Help needed to walk in hospital room?: A Little Help needed climbing 3-5 steps with a railing? : A Little 6 Click Score: 18    End of Session Equipment Utilized During Treatment: Gait belt Activity Tolerance: Patient limited by pain Patient left: with call bell/phone within reach;in bed;with bed alarm set Nurse Communication: Mobility status PT Visit Diagnosis: Muscle weakness (generalized) (M62.81);Difficulty in walking, not elsewhere classified (R26.2);Other symptoms and signs involving the nervous system (R29.898);Pain Pain - Right/Left:  (incisional midline.)     Time: 1030-1049 PT Time Calculation (min) (ACUTE ONLY): 19 min  Charges:  $Gait Training: 8-22 mins                     Julie Obrien , PTA Acute Rehabilitation Services Pager [phone] Office [phone]    Julie Obrien 03/12/2021, 11:03 AM

## 2021-03-12 NOTE — Plan of Care (Signed)

## 2021-03-12 NOTE — Progress Notes (Signed)
   Providing Compassionate, Quality Care - Together  NEUROSURGERY PROGRESS NOTE   S: No issues overnight.  Feels slightly depressed this morning.  O: EXAM:  BP (!) 103/59 (BP Location: Left Arm)   Pulse 78   Temp 97.9 F (36.6 C) (Oral)   Resp 16   Ht '5\' 3"'$  (1.6 m)   Wt 82.6 kg   SpO2 95%   BMI 32.24 kg/m   Awake, alert, oriented  Speech fluent, appropriate  CNs grossly intact  5/5 BUE/BLE   ASSESSMENT:  70 y.o. female with   Status post T11-L3 instrumentation and fusion  PLAN: -SNF placement pending, patient would like to go to rehab facility -Continue pain control -Continue PT/OT    Thank you for allowing me to participate in this patient's care.  Please do not hesitate to call with questions or concerns.   Elwin Sleight, [facility] Cell: [phone]

## 2021-03-12 NOTE — Plan of Care (Signed)
Patient doing well with pain control and mobility walking back and forth to bathroom with one assist using walker. Problem: Education: Goal: Knowledge of General Education information will improve Description: Including pain rating scale, medication(s)/side effects and non-pharmacologic comfort measures Outcome: Adequate for Discharge   Problem: Health Behavior/Discharge Planning: Goal: Ability to manage health-related needs will improve Outcome: Adequate for Discharge   Problem: Clinical Measurements: Goal: Ability to maintain clinical measurements within normal limits will improve Outcome: Adequate for Discharge

## 2021-03-13 NOTE — TOC Progression Note (Signed)
Transition of Care [facility]) - Progression Note    Patient Details  Name: Julie Obrien MRN: [id number] Date of Birth: 05-Nov-1950  Transition of Care [facility]) CM/SW [place], [place] Phone Number: 03/13/2021, 9:30 AM  Clinical Narrative:     CSW spoke with front desk at [facility] who confirmed referral coordinator would be back first thing in the morning. CSW will follow up with Dorian Pod with [facility] in the morning to confirm SNF bed for patient. CSW will continue to follow and assist with dc planning needs.  Expected Discharge Plan: [place] (declined SNF placement) Barriers to Discharge: Continued Medical Work up  Expected Discharge Plan and Services Expected Discharge Plan: [place] (declined SNF placement)   Discharge Planning Services: CM Consult Post Acute Care Choice: [place] arrangements for the past 2 months: Single Family Home                 DME Arranged: Hospital bed DME Agency: [employer] Date DME Agency Contacted: 03/11/21 Time DME Agency Contacted: 1410 Representative spoke with at DME Agency: Freda Munro( voice message left) HH Arranged: PT, OT   Date HH Agency Contacted: 03/11/21 Time [place]: [id number] Representative spoke with at [place]: Amy   Social Determinants of Health ([place]) Interventions    Readmission Risk Interventions No flowsheet data found.

## 2021-03-13 NOTE — Progress Notes (Signed)
Subjective: NAEs o/n  Objective: Vital signs in last 24 hours: Temp:  [97.7 F (36.5 C)-98.5 F (36.9 C)] 97.7 F (36.5 C) (07/31 0728) Pulse Rate:  [87-89] 89 (07/31 0728) Resp:  [17-20] 20 (07/31 0728) BP: (94-105)/(43-80) 99/80 (07/31 0728) SpO2:  [91 %-95 %] 95 % (07/31 0728)  Intake/Output from previous day: 07/30 0701 - 07/31 0700 In: 240 [P.O.:240] Out: -  Intake/Output this shift: No intake/output data recorded.  NAD Alert, Ox3 Full strength in Les C/d/i  Lab Results: No results for input(s): WBC, HGB, HCT, PLT in the last 72 hours. BMET No results for input(s): NA, K, CL, CO2, GLUCOSE, BUN, CREATININE, CALCIUM in the last 72 hours.  Studies/Results: No results found.  Assessment/Plan: S/p T11-L3 instrumentation and fusion - awaiting SNF - cont PT/OT   Julie Obrien 03/13/2021, 12:06 PM

## 2021-03-14 DIAGNOSIS — M4846XD Fatigue fracture of vertebra, lumbar region, subsequent encounter for fracture with routine healing: Secondary | ICD-10-CM | POA: Diagnosis not present

## 2021-03-14 DIAGNOSIS — R0902 Hypoxemia: Secondary | ICD-10-CM | POA: Diagnosis not present

## 2021-03-14 DIAGNOSIS — M62830 Muscle spasm of back: Secondary | ICD-10-CM | POA: Diagnosis not present

## 2021-03-14 DIAGNOSIS — M4712 Other spondylosis with myelopathy, cervical region: Secondary | ICD-10-CM | POA: Diagnosis not present

## 2021-03-14 DIAGNOSIS — G8918 Other acute postprocedural pain: Secondary | ICD-10-CM | POA: Diagnosis not present

## 2021-03-14 DIAGNOSIS — M6281 Muscle weakness (generalized): Secondary | ICD-10-CM | POA: Diagnosis not present

## 2021-03-14 DIAGNOSIS — T8484XD Pain due to internal orthopedic prosthetic devices, implants and grafts, subsequent encounter: Secondary | ICD-10-CM | POA: Diagnosis not present

## 2021-03-14 DIAGNOSIS — M549 Dorsalgia, unspecified: Secondary | ICD-10-CM | POA: Diagnosis not present

## 2021-03-14 DIAGNOSIS — Z4789 Encounter for other orthopedic aftercare: Secondary | ICD-10-CM | POA: Diagnosis not present

## 2021-03-14 DIAGNOSIS — M48061 Spinal stenosis, lumbar region without neurogenic claudication: Secondary | ICD-10-CM | POA: Diagnosis not present

## 2021-03-14 DIAGNOSIS — Z7401 Bed confinement status: Secondary | ICD-10-CM | POA: Diagnosis not present

## 2021-03-14 DIAGNOSIS — Z792 Long term (current) use of antibiotics: Secondary | ICD-10-CM | POA: Diagnosis not present

## 2021-03-14 LAB — RESP PANEL BY RT-PCR (FLU A&B, COVID) ARPGX2
Influenza A by PCR: NEGATIVE
Influenza B by PCR: NEGATIVE
SARS Coronavirus 2 by RT PCR: NEGATIVE

## 2021-03-14 MED ORDER — OXYCODONE HCL 5 MG PO TABS: 5.0000 mg | ORAL_TABLET | ORAL | 0 refills | Status: DC | PRN

## 2021-03-14 NOTE — Plan of Care (Signed)

## 2021-03-14 NOTE — Discharge Summary (Signed)
Physician Discharge Summary  Patient ID: Julie Obrien MRN: [id number] DOB/AGE: 04-06-1951 70 y.o.  Admit date: 03/04/2021 Discharge date: 03/14/2021  Admission Diagnoses:  Discharge Diagnoses:  Active Problems:   Fatigue fracture of vertebra, lumbar region, initial encounter for fracture   Discharged Condition: good  Hospital Course: Patient admitted to the hospital for treatment of a failed lumbar fusion with revision thoracic lumbar fusion.  Postop the patient is progressing reasonably well.  Her pain is much improved.  She is having no new neurologic symptoms.  Her mobility is marginal for discharge home and plan is for discharge to skilled nursing facility for further convalescence.  Consults:   Significant Diagnostic Studies:   Treatments:   Discharge Exam: Blood pressure 111/61, pulse 83, temperature 98 F (36.7 C), temperature source Oral, resp. rate 17, height '5\' 3"'$  (1.6 m), weight 82.6 kg, SpO2 99 %.   Awake and alert.  Oriented and appropriate.  Motor and sensory function intact.  Wound clean and dry.  Chest and abdomen b  Disposition: Discharge disposition: 03-Skilled Nursing Facility        Allergies as of 03/14/2021       Reactions   Nisoldipine Hives   Augmentin [amoxicillin-pot Clavulanate] Nausea And Vomiting   "Tore up my stomach" (verified this with her daughter, a physician).     Sulfa Antibiotics Hives        Medication List     TAKE these medications    albuterol 108 (90 Base) MCG/ACT inhaler Commonly known as: Ventolin HFA Inhale 1-2 puffs into the lungs every 4 (four) hours as needed for wheezing or shortness of breath.   cyclobenzaprine 5 MG tablet Commonly known as: FLEXERIL Take 1 tablet (5 mg total) by mouth 3 (three) times daily as needed for muscle spasms.   doxycycline 100 MG capsule Commonly known as: MONODOX Take 100 mg by mouth 2 (two) times daily.   methocarbamol 750 MG tablet Commonly known as: ROBAXIN Take 1 tablet  (750 mg total) by mouth every 6 (six) hours as needed for muscle spasms.   OMEGA 3 PO Take 1 capsule by mouth 2 (two) times daily after a meal. Omega XL   oxyCODONE 5 MG immediate release tablet Commonly known as: Oxy IR/ROXICODONE Take 1 tablet (5 mg total) by mouth every 4 (four) hours as needed for severe pain or moderate pain.   oxyCODONE-acetaminophen 5-325 MG tablet Commonly known as: Percocet Take 1 tablet by mouth every 4 (four) hours as needed for severe pain.   Vitamin D (Ergocalciferol) 1.25 MG (50000 UNIT) Caps capsule Commonly known as: DRISDOL TAKE 1 CAPSULE (50,000 UNITS TOTAL) BY MOUTH 2 (TWO) TIMES A WEEK. TAKE ON TUES & FRI               Durable Medical Equipment  (From admission, onward)           Start     Ordered   03/11/21 1407  For home use only DME Hospital bed  Once       Question Answer Comment  Length of Need 6 Months   Patient has (list medical condition): Revision Lumbar fusion   The above medical condition requires: Patient requires the ability to reposition frequently   Head must be elevated greater than: 30 degrees   Bed type Semi-electric   Support Surface: Gel Overlay      03/11/21 1408            Contact information for after-discharge care  Destination     [facility] Preferred SNF .   Service: Skilled Nursing Contact information: [address] [place] [phone]                     Signed: Charlie Pitter 03/14/2021, 4:07 PM

## 2021-03-14 NOTE — TOC Progression Note (Signed)
Transition of Care [facility] - Progression Note    Patient Details  Name: Julie Obrien MRN: [id number] Date of Birth: October 04, 1950  Transition of Care [facility]) CM/SW Contact  Milinda Antis, [place] Phone Number: 03/14/2021, 10:08 AM  Clinical Narrative:    9:30-  CSW [facility] and spoke with Dorian Pod in admissions.  The facility will be able to accept the patient today.    CSW called the attending's office and was directed to the PA.  CSW left a VM informing PA that the patient can d/c to a SNF today and requested that the discharge summary and any hard scripts for narcos be sent.    Pending: d/c summary   Expected Discharge Plan: [employer] (declined SNF placement) Barriers to Discharge: Continued Medical Work up  Expected Discharge Plan and Services Expected Discharge Plan: [place] (declined SNF placement)   Discharge Planning Services: CM Consult Post Acute Care Choice: [place] arrangements for the past 2 months: Single Family Home                 DME Arranged: Hospital bed DME Agency: [employer] Date DME Agency Contacted: 03/11/21 Time DME Agency Contacted: 1410 Representative spoke with at DME Agency: Freda Munro( voice message left) HH Arranged: PT, OT   Date HH Agency Contacted: 03/11/21 Time [place]: [id number] Representative spoke with at [place]: Amy   Social Determinants of Health ([place]) Interventions    Readmission Risk Interventions No flowsheet data found.

## 2021-03-14 NOTE — TOC Transition Note (Signed)
Transition of Care [facility]) - CM/SW Discharge Note   Patient Details  Name: Julie Obrien MRN: [id number] Date of Birth: 1950/09/06  [facility] of [facility]) CM/SW Contact:  Milinda Antis, [place] Phone Number: 03/14/2021, 12:05 PM   Clinical Narrative:    Patient will DC to: [facility] Anticipated DC date: 03/14/2021 Family notified:Yes Transport by: Corey Harold   Per MD patient ready for DC to SNF. RN to call report prior to discharge [phone]. RN, patient, patient's family, and facility notified of DC. Discharge Summary and FL2 sent to facility. DC packet on chart. Ambulance transport will be requested for patient when RN reports that patient is ready.   CSW will sign off for now as social work intervention is no longer needed. Please consult [place] again if new needs arise.     Final next level of care: Skilled Nursing Facility Barriers to Discharge: Barriers Resolved   Patient Goals and CMS Choice   CMS Medicare.gov Compare Post Acute Care list provided to:: Patient Choice offered to / list presented to : Patient  Discharge Placement              Patient chooses bed at:  [facility]) Patient to be transferred to facility by: [address] Name of family member notified: Dia,Fanta Dr. (Daughter)   [phone] Patient and family notified of of transfer: 03/14/21  Discharge Plan and Services   Discharge Planning Services: CM Consult Post Acute Care Choice: Home Health          DME Arranged: Hospital bed DME Agency: [employer] Date DME Agency Contacted: 03/11/21 Time DME Agency Contacted: 59 Representative spoke with at DME Agency: Freda Munro( voice message left) HH Arranged: PT, OT   Date HH Agency Contacted: 03/11/21 Time [place]: 1413 Representative spoke with at [place]: [place] Interventions     Readmission Risk Interventions No flowsheet data found.

## 2021-03-14 NOTE — Discharge Summary (Signed)
Physician Discharge Summary  Patient ID: Julie Obrien MRN: [id number] DOB/AGE: 1950-10-18 70 y.o. Estimated body mass index is 32.24 kg/m as calculated from the following:   Height as of this encounter: '5\' 3"'$  (1.6 m).   Weight as of this encounter: 82.6 kg.   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  Postoperative patient did fairly well was slow to mobilize have difficult pain management with for several days however responded to physical therapy pain medicine IV steroids.  Patient was progressively mobilized and became independent enough to progress onto skilled nursing with therapy.  Patient is stable for discharge schedule follow-up in approximate 1 to 2 weeks.  Patient to be discharged with oxycodone for pain.  Consults: Significant Diagnostic Studies: Treatments: Revision of spinal fusion T11-L3 Discharge Exam: Blood pressure (!) 97/49, pulse 83, temperature 99.8 F (37.7 C), temperature source Oral, resp. rate 17, height '5\' 3"'$  (1.6 m), weight 82.6 kg, SpO2 94 %. Strength out of 5 wound clean dry and intact  Disposition: Skilled nursing facility   Allergies as of 03/14/2021       Reactions   Nisoldipine Hives   Augmentin [amoxicillin-pot Clavulanate] Nausea And Vomiting   "Tore up my stomach" (verified this with her daughter, a physician).     Sulfa Antibiotics Hives        Medication List     TAKE these medications    albuterol 108 (90 Base) MCG/ACT inhaler Commonly known as: Ventolin HFA Inhale 1-2 puffs into the lungs every 4 (four) hours as needed for wheezing or shortness of breath.   cyclobenzaprine 5 MG  tablet Commonly known as: FLEXERIL Take 1 tablet (5 mg total) by mouth 3 (three) times daily as needed for muscle spasms.   doxycycline 100 MG capsule Commonly known as: MONODOX Take 100 mg by mouth 2 (two) times daily.   methocarbamol 750 MG tablet Commonly known as: ROBAXIN Take 1 tablet (750 mg total) by mouth every 6 (six) hours as needed for muscle spasms.   OMEGA 3 PO Take 1 capsule by mouth 2 (two) times daily after a meal. Omega XL   oxyCODONE 5 MG immediate release tablet Commonly known as: Oxy IR/ROXICODONE Take 1 tablet (5 mg total) by mouth every 4 (four) hours as needed for severe pain or moderate pain.   oxyCODONE-acetaminophen 5-325 MG tablet Commonly known as: Percocet Take 1 tablet by mouth every 4 (four) hours as needed for severe pain.   Vitamin D (Ergocalciferol) 1.25 MG (50000 UNIT) Caps capsule Commonly known as: DRISDOL TAKE 1 CAPSULE (50,000 UNITS TOTAL) BY MOUTH 2 (TWO) TIMES A WEEK. TAKE ON TUES & FRI               Durable Medical Equipment  (From admission, onward)           Start     Ordered   03/11/21 1407  For home use only DME Hospital bed  Once       Question Answer Comment  Length of Need 6 Months   Patient has (list medical condition): Revision Lumbar fusion   The above medical condition requires: Patient requires the ability to reposition frequently   Head must be elevated greater than:  30 degrees   Bed type Semi-electric   Support Surface: Gel Overlay      03/11/21 1408             Signed: Elaina Hoops 03/14/2021, 11:11 AM

## 2021-03-14 NOTE — Progress Notes (Signed)
Occupational Therapy Treatment Patient Details Name: Julie Obrien MRN: [id number] DOB: 01-23-1951 Today's Date: 03/14/2021    History of present illness 70 y.o. F admitted to [facility] on 03/05/21 for redo fusion of L1-L2 with extension up to T11 with bilateral pedicle screws placed at T11-T12 and replacing R L1 screw with a larger one. Prior medical history is significant for, but not limited to OA and multiple lumbar surgeries.   OT comments  Julie Obrien is progressing well. Pt was received in the bathroom after completing toileting tasks. Pt completed grooming at the sink with supervision A and vc for compensatory strategies to maintain back precautions with oral hygiene. Pt demonstrated great ability to use AE for lower body dressing given vc throughout for sequencing. Pt continues to benefit from OT acutely. D/c plan remains appropriate.    Follow Up Recommendations  SNF;Supervision/Assistance - 24 hour    Equipment Recommendations  None recommended by OT       Precautions / Restrictions Precautions Precautions: Back;Fall Precaution Booklet Issued: No Precaution Comments: pt well versed in back precautions from previous surgeries Required Braces or Orthoses: Spinal Brace Spinal Brace: Lumbar corset;Applied in sitting position Restrictions Weight Bearing Restrictions: No       Mobility Bed Mobility Overal bed mobility: Needs Assistance             General bed mobility comments: pt recieved in bathroom and left sitting in chair    Transfers Overall transfer level: Needs assistance Equipment used: Rolling walker (2 wheeled) Transfers: Sit to/from Stand Sit to Stand: Min guard         General transfer comment: pt required incrased time for sit<>Stand and vc to control sit with bilat UE    Balance Overall balance assessment: Needs assistance Sitting-balance support: Feet supported;Bilateral upper extremity supported;No upper extremity supported Sitting balance-Leahy  Scale: Good     Standing balance support: Single extremity supported;During functional activity Standing balance-Leahy Scale: Fair                             ADL either performed or assessed with clinical judgement   ADL       Grooming: Supervision/safety;Standing;Cueing for safety Grooming Details (indicate cue type and reason): hand washing at the sink, supervision for safety. compensatory techniques for oral hygiene reviewed             Lower Body Dressing: Minimal assistance;Cueing for compensatory techniques;Sit to/from stand Lower Body Dressing Details (indicate cue type and reason): compensatory tehcniques for lower bdoy dressing reviewed, pt demonstrated great ability to use reacher and sock aide             Functional mobility during ADLs: Supervision/safety;Rolling walker;Cueing for safety General ADL Comments: practiced with compensatory lower body techniques this session; pt has expereience with reacher PTA     Vision   Vision Assessment?: No apparent visual deficits          Cognition Arousal/Alertness: Awake/alert Behavior During Therapy: WFL for tasks assessed/performed Overall Cognitive Status: Within Functional Limits for tasks assessed                                  General Comments VSS on RA, no new concerns    Pertinent Vitals/ Pain       Pain Assessment: Faces Faces Pain Scale: Hurts a little bit Pain Location: generalized, lower back with transitional movements  Pain Descriptors / Indicators: Discomfort;Grimacing;Guarding Pain Intervention(s): Monitored during session   Frequency  Min 2X/week        Progress Toward Goals  OT Goals(current goals can now be found in the care plan section)  Progress towards OT goals: Progressing toward goals  Acute Rehab OT Goals Patient Stated Goal: To lessen pain and get stronger OT Goal Formulation: With patient Time For Goal Achievement: 03/19/21 Potential to  Achieve Goals: Good ADL Goals Pt Will Perform Grooming: with supervision;standing Pt Will Perform Lower Body Bathing: with supervision;with adaptive equipment;sit to/from stand;sitting/lateral leans Pt Will Perform Upper Body Dressing: with modified independence Pt Will Perform Lower Body Dressing: with supervision;with adaptive equipment;sitting/lateral leans;sit to/from stand Pt Will Transfer to Toilet: with supervision;ambulating Pt Will Perform Toileting - Clothing Manipulation and hygiene: with supervision;sitting/lateral leans;sit to/from stand;with adaptive equipment Additional ADL Goal #1: Patient will recall 3/3 spinal precautions and demonstrate good carryover during ADLs/IADLs in prep for safe return home.  Plan Discharge plan remains appropriate       AM-PAC OT "6 Clicks" Daily Activity     Outcome Measure   Help from another person eating meals?: None Help from another person taking care of personal grooming?: A Little Help from another person toileting, which includes using toliet, bedpan, or urinal?: A Little Help from another person bathing (including washing, rinsing, drying)?: A Little Help from another person to put on and taking off regular upper body clothing?: A Little Help from another person to put on and taking off regular lower body clothing?: A Little 6 Click Score: 19    End of Session Equipment Utilized During Treatment: Gait belt;Rolling walker;Back brace  OT Visit Diagnosis: Unsteadiness on feet (R26.81);Muscle weakness (generalized) (M62.81);Pain Pain - Right/Left: Right   Activity Tolerance Patient tolerated treatment well   Patient Left in chair;with call bell/phone within reach   Nurse Communication Mobility status;Precautions;Weight bearing status        Time: [id number] OT Time Calculation (min): 20 min  Charges: OT General Charges $OT Visit: 1 Visit OT Treatments $Self Care/Home Management : 8-22 mins     Amy Belloso A  Rishita Petron 03/14/2021, 9:57 AM

## 2021-03-15 DIAGNOSIS — G8918 Other acute postprocedural pain: Secondary | ICD-10-CM | POA: Diagnosis not present

## 2021-03-15 DIAGNOSIS — M4846XD Fatigue fracture of vertebra, lumbar region, subsequent encounter for fracture with routine healing: Secondary | ICD-10-CM | POA: Diagnosis not present

## 2021-03-15 DIAGNOSIS — Z4789 Encounter for other orthopedic aftercare: Secondary | ICD-10-CM | POA: Diagnosis not present

## 2021-03-15 DIAGNOSIS — M62830 Muscle spasm of back: Secondary | ICD-10-CM | POA: Diagnosis not present

## 2021-03-15 DIAGNOSIS — Z792 Long term (current) use of antibiotics: Secondary | ICD-10-CM | POA: Diagnosis not present

## 2021-03-22 DIAGNOSIS — M6281 Muscle weakness (generalized): Secondary | ICD-10-CM | POA: Diagnosis not present

## 2021-03-22 DIAGNOSIS — G8918 Other acute postprocedural pain: Secondary | ICD-10-CM | POA: Diagnosis not present

## 2021-03-22 DIAGNOSIS — Z4789 Encounter for other orthopedic aftercare: Secondary | ICD-10-CM | POA: Diagnosis not present

## 2021-03-22 DIAGNOSIS — M4846XD Fatigue fracture of vertebra, lumbar region, subsequent encounter for fracture with routine healing: Secondary | ICD-10-CM | POA: Diagnosis not present

## 2021-03-22 DIAGNOSIS — Z792 Long term (current) use of antibiotics: Secondary | ICD-10-CM | POA: Diagnosis not present

## 2021-03-22 DIAGNOSIS — M62830 Muscle spasm of back: Secondary | ICD-10-CM | POA: Diagnosis not present

## 2021-03-24 DIAGNOSIS — Z4789 Encounter for other orthopedic aftercare: Secondary | ICD-10-CM | POA: Diagnosis not present

## 2021-03-24 DIAGNOSIS — M4846XD Fatigue fracture of vertebra, lumbar region, subsequent encounter for fracture with routine healing: Secondary | ICD-10-CM | POA: Diagnosis not present

## 2021-03-24 DIAGNOSIS — G8918 Other acute postprocedural pain: Secondary | ICD-10-CM | POA: Diagnosis not present

## 2021-03-24 DIAGNOSIS — Z792 Long term (current) use of antibiotics: Secondary | ICD-10-CM | POA: Diagnosis not present

## 2021-03-24 DIAGNOSIS — M6281 Muscle weakness (generalized): Secondary | ICD-10-CM | POA: Diagnosis not present

## 2021-03-24 DIAGNOSIS — M62830 Muscle spasm of back: Secondary | ICD-10-CM | POA: Diagnosis not present

## 2021-03-25 DIAGNOSIS — I10 Essential (primary) hypertension: Secondary | ICD-10-CM | POA: Diagnosis not present

## 2021-03-25 DIAGNOSIS — Z981 Arthrodesis status: Secondary | ICD-10-CM | POA: Diagnosis not present

## 2021-03-25 DIAGNOSIS — Z4782 Encounter for orthopedic aftercare following scoliosis surgery: Secondary | ICD-10-CM | POA: Diagnosis not present

## 2021-03-25 DIAGNOSIS — Z4789 Encounter for other orthopedic aftercare: Secondary | ICD-10-CM | POA: Diagnosis not present

## 2021-03-25 DIAGNOSIS — R262 Difficulty in walking, not elsewhere classified: Secondary | ICD-10-CM | POA: Diagnosis not present

## 2021-03-25 DIAGNOSIS — Z967 Presence of other bone and tendon implants: Secondary | ICD-10-CM | POA: Diagnosis not present

## 2021-03-29 ENCOUNTER — Other Ambulatory Visit: Payer: Self-pay | Admitting: *Deleted

## 2021-03-29 DIAGNOSIS — Z4789 Encounter for other orthopedic aftercare: Secondary | ICD-10-CM | POA: Diagnosis not present

## 2021-03-29 DIAGNOSIS — R262 Difficulty in walking, not elsewhere classified: Secondary | ICD-10-CM | POA: Diagnosis not present

## 2021-03-29 DIAGNOSIS — Z981 Arthrodesis status: Secondary | ICD-10-CM | POA: Diagnosis not present

## 2021-03-29 DIAGNOSIS — Z967 Presence of other bone and tendon implants: Secondary | ICD-10-CM | POA: Diagnosis not present

## 2021-03-29 DIAGNOSIS — I10 Essential (primary) hypertension: Secondary | ICD-10-CM | POA: Diagnosis not present

## 2021-03-29 DIAGNOSIS — Z4782 Encounter for orthopedic aftercare following scoliosis surgery: Secondary | ICD-10-CM | POA: Diagnosis not present

## 2021-03-29 NOTE — Patient Outreach (Signed)
[facility] Post- Acute Care Coordinator. Member screened for potential care coordination needs.   Mrs. Colvert recently discharged from [facility]. Telephone call made to Mrs. Brutus [phone]. Patient identifiers confirmed.   Mrs. Croman states she is doing well. Reports [facility] home [facility] is coming out again today. States she is primarily following up with the surgeon.   Denies having any care coordination needs. Made Mrs. Harewood aware that PCP office has [facility] embedded care coordination team. Also encouraged Mrs. Wais to contact PCP office for a sooner appointment since her next appointment is scheduled for November.  Mrs. Glendening states her daughter is a physician. States her husband works during the day but she is managing well thus far.   No further needs assessed. Expressed appreciation of the call.    Marthenia Rolling, MSN, RN,BSN [facility] Acute Care Coordinator [phone] [facility]) [phone]  (Toll free office)

## 2021-03-30 DIAGNOSIS — Z981 Arthrodesis status: Secondary | ICD-10-CM | POA: Diagnosis not present

## 2021-03-30 DIAGNOSIS — R262 Difficulty in walking, not elsewhere classified: Secondary | ICD-10-CM | POA: Diagnosis not present

## 2021-03-30 DIAGNOSIS — I10 Essential (primary) hypertension: Secondary | ICD-10-CM | POA: Diagnosis not present

## 2021-03-30 DIAGNOSIS — Z967 Presence of other bone and tendon implants: Secondary | ICD-10-CM | POA: Diagnosis not present

## 2021-03-30 DIAGNOSIS — Z4789 Encounter for other orthopedic aftercare: Secondary | ICD-10-CM | POA: Diagnosis not present

## 2021-03-30 DIAGNOSIS — Z4782 Encounter for orthopedic aftercare following scoliosis surgery: Secondary | ICD-10-CM | POA: Diagnosis not present

## 2021-03-31 DIAGNOSIS — Z967 Presence of other bone and tendon implants: Secondary | ICD-10-CM | POA: Diagnosis not present

## 2021-03-31 DIAGNOSIS — Z4782 Encounter for orthopedic aftercare following scoliosis surgery: Secondary | ICD-10-CM | POA: Diagnosis not present

## 2021-03-31 DIAGNOSIS — Z981 Arthrodesis status: Secondary | ICD-10-CM | POA: Diagnosis not present

## 2021-03-31 DIAGNOSIS — Z4789 Encounter for other orthopedic aftercare: Secondary | ICD-10-CM | POA: Diagnosis not present

## 2021-03-31 DIAGNOSIS — R262 Difficulty in walking, not elsewhere classified: Secondary | ICD-10-CM | POA: Diagnosis not present

## 2021-03-31 DIAGNOSIS — I10 Essential (primary) hypertension: Secondary | ICD-10-CM | POA: Diagnosis not present

## 2021-04-04 DIAGNOSIS — I10 Essential (primary) hypertension: Secondary | ICD-10-CM | POA: Diagnosis not present

## 2021-04-04 DIAGNOSIS — Z4789 Encounter for other orthopedic aftercare: Secondary | ICD-10-CM | POA: Diagnosis not present

## 2021-04-04 DIAGNOSIS — Z4782 Encounter for orthopedic aftercare following scoliosis surgery: Secondary | ICD-10-CM | POA: Diagnosis not present

## 2021-04-04 DIAGNOSIS — Z967 Presence of other bone and tendon implants: Secondary | ICD-10-CM | POA: Diagnosis not present

## 2021-04-04 DIAGNOSIS — R262 Difficulty in walking, not elsewhere classified: Secondary | ICD-10-CM | POA: Diagnosis not present

## 2021-04-04 DIAGNOSIS — Z981 Arthrodesis status: Secondary | ICD-10-CM | POA: Diagnosis not present

## 2021-04-07 DIAGNOSIS — Z967 Presence of other bone and tendon implants: Secondary | ICD-10-CM | POA: Diagnosis not present

## 2021-04-07 DIAGNOSIS — I10 Essential (primary) hypertension: Secondary | ICD-10-CM | POA: Diagnosis not present

## 2021-04-07 DIAGNOSIS — Z4789 Encounter for other orthopedic aftercare: Secondary | ICD-10-CM | POA: Diagnosis not present

## 2021-04-07 DIAGNOSIS — R262 Difficulty in walking, not elsewhere classified: Secondary | ICD-10-CM | POA: Diagnosis not present

## 2021-04-07 DIAGNOSIS — Z981 Arthrodesis status: Secondary | ICD-10-CM | POA: Diagnosis not present

## 2021-04-07 DIAGNOSIS — Z4782 Encounter for orthopedic aftercare following scoliosis surgery: Secondary | ICD-10-CM | POA: Diagnosis not present

## 2021-04-11 DIAGNOSIS — Z4789 Encounter for other orthopedic aftercare: Secondary | ICD-10-CM | POA: Diagnosis not present

## 2021-04-11 DIAGNOSIS — I10 Essential (primary) hypertension: Secondary | ICD-10-CM | POA: Diagnosis not present

## 2021-04-11 DIAGNOSIS — R262 Difficulty in walking, not elsewhere classified: Secondary | ICD-10-CM | POA: Diagnosis not present

## 2021-04-11 DIAGNOSIS — Z981 Arthrodesis status: Secondary | ICD-10-CM | POA: Diagnosis not present

## 2021-04-11 DIAGNOSIS — Z4782 Encounter for orthopedic aftercare following scoliosis surgery: Secondary | ICD-10-CM | POA: Diagnosis not present

## 2021-04-11 DIAGNOSIS — Z967 Presence of other bone and tendon implants: Secondary | ICD-10-CM | POA: Diagnosis not present

## 2021-04-12 DIAGNOSIS — M5440 Lumbago with sciatica, unspecified side: Secondary | ICD-10-CM | POA: Diagnosis not present

## 2021-04-12 DIAGNOSIS — Z6831 Body mass index (BMI) 31.0-31.9, adult: Secondary | ICD-10-CM | POA: Diagnosis not present

## 2021-04-12 DIAGNOSIS — R030 Elevated blood-pressure reading, without diagnosis of hypertension: Secondary | ICD-10-CM | POA: Diagnosis not present

## 2021-04-13 DIAGNOSIS — Z4782 Encounter for orthopedic aftercare following scoliosis surgery: Secondary | ICD-10-CM | POA: Diagnosis not present

## 2021-04-13 DIAGNOSIS — Z4789 Encounter for other orthopedic aftercare: Secondary | ICD-10-CM | POA: Diagnosis not present

## 2021-04-13 DIAGNOSIS — Z967 Presence of other bone and tendon implants: Secondary | ICD-10-CM | POA: Diagnosis not present

## 2021-04-13 DIAGNOSIS — Z981 Arthrodesis status: Secondary | ICD-10-CM | POA: Diagnosis not present

## 2021-04-13 DIAGNOSIS — I10 Essential (primary) hypertension: Secondary | ICD-10-CM | POA: Diagnosis not present

## 2021-04-13 DIAGNOSIS — R262 Difficulty in walking, not elsewhere classified: Secondary | ICD-10-CM | POA: Diagnosis not present

## 2021-04-16 ENCOUNTER — Other Ambulatory Visit: Payer: Self-pay | Admitting: Internal Medicine

## 2021-04-20 DIAGNOSIS — Z967 Presence of other bone and tendon implants: Secondary | ICD-10-CM | POA: Diagnosis not present

## 2021-04-20 DIAGNOSIS — Z4789 Encounter for other orthopedic aftercare: Secondary | ICD-10-CM | POA: Diagnosis not present

## 2021-04-20 DIAGNOSIS — Z981 Arthrodesis status: Secondary | ICD-10-CM | POA: Diagnosis not present

## 2021-04-20 DIAGNOSIS — Z4782 Encounter for orthopedic aftercare following scoliosis surgery: Secondary | ICD-10-CM | POA: Diagnosis not present

## 2021-04-20 DIAGNOSIS — R262 Difficulty in walking, not elsewhere classified: Secondary | ICD-10-CM | POA: Diagnosis not present

## 2021-04-20 DIAGNOSIS — I10 Essential (primary) hypertension: Secondary | ICD-10-CM | POA: Diagnosis not present

## 2021-04-27 DIAGNOSIS — I10 Essential (primary) hypertension: Secondary | ICD-10-CM | POA: Diagnosis not present

## 2021-04-27 DIAGNOSIS — R262 Difficulty in walking, not elsewhere classified: Secondary | ICD-10-CM | POA: Diagnosis not present

## 2021-04-27 DIAGNOSIS — Z967 Presence of other bone and tendon implants: Secondary | ICD-10-CM | POA: Diagnosis not present

## 2021-04-27 DIAGNOSIS — Z981 Arthrodesis status: Secondary | ICD-10-CM | POA: Diagnosis not present

## 2021-04-27 DIAGNOSIS — Z4782 Encounter for orthopedic aftercare following scoliosis surgery: Secondary | ICD-10-CM | POA: Diagnosis not present

## 2021-04-27 DIAGNOSIS — Z4789 Encounter for other orthopedic aftercare: Secondary | ICD-10-CM | POA: Diagnosis not present

## 2021-05-24 DIAGNOSIS — M5440 Lumbago with sciatica, unspecified side: Secondary | ICD-10-CM | POA: Diagnosis not present

## 2021-05-24 DIAGNOSIS — Z6830 Body mass index (BMI) 30.0-30.9, adult: Secondary | ICD-10-CM | POA: Diagnosis not present

## 2021-05-24 DIAGNOSIS — R030 Elevated blood-pressure reading, without diagnosis of hypertension: Secondary | ICD-10-CM | POA: Diagnosis not present

## 2021-06-09 DIAGNOSIS — I6502 Occlusion and stenosis of left vertebral artery: Secondary | ICD-10-CM | POA: Diagnosis not present

## 2021-06-09 DIAGNOSIS — R519 Headache, unspecified: Secondary | ICD-10-CM | POA: Diagnosis not present

## 2021-06-09 DIAGNOSIS — I6523 Occlusion and stenosis of bilateral carotid arteries: Secondary | ICD-10-CM | POA: Diagnosis not present

## 2021-06-09 DIAGNOSIS — Z79899 Other long term (current) drug therapy: Secondary | ICD-10-CM | POA: Diagnosis not present

## 2021-06-09 DIAGNOSIS — R531 Weakness: Secondary | ICD-10-CM | POA: Diagnosis not present

## 2021-06-09 DIAGNOSIS — F17200 Nicotine dependence, unspecified, uncomplicated: Secondary | ICD-10-CM | POA: Diagnosis not present

## 2021-06-09 DIAGNOSIS — R42 Dizziness and giddiness: Secondary | ICD-10-CM | POA: Diagnosis not present

## 2021-06-22 ENCOUNTER — Ambulatory Visit (INDEPENDENT_AMBULATORY_CARE_PROVIDER_SITE_OTHER): Payer: Medicare Other

## 2021-06-22 VITALS — Ht 63.0 in | Wt 170.0 lb

## 2021-06-22 DIAGNOSIS — Z0000 Encounter for general adult medical examination without abnormal findings: Secondary | ICD-10-CM | POA: Diagnosis not present

## 2021-06-22 NOTE — Patient Instructions (Signed)
Julie Obrien , Thank you for taking time to come for your Medicare Wellness Visit. I appreciate your ongoing commitment to your health goals. Please review the following plan we discussed and let me know if I can assist you in the future.   Screening recommendations/referrals: Colonoscopy: completed 03/05/2017, due 03/05/2024 Mammogram: scheduled for 07/12/2021 Bone Density: completed 06/26/2019 Recommended yearly ophthalmology/optometry visit for glaucoma screening and checkup Recommended yearly dental visit for hygiene and checkup  Vaccinations: Influenza vaccine: decline Pneumococcal vaccine: decline Tdap vaccine: due Shingles vaccine: discussed   Covid-19: 10/11/2019, 09/20/2019  Advanced directives: Advance directive discussed with you today.   Conditions/risks identified: smoking  Next appointment: Follow up in one year for your annual wellness visit    Preventive Care 32 Years and Older, Female Preventive care refers to lifestyle choices and visits with your health care provider that can promote health and wellness. What does preventive care include? A yearly physical exam. This is also called an annual well check. Dental exams once or twice a year. Routine eye exams. Ask your health care provider how often you should have your eyes checked. Personal lifestyle choices, including: Daily care of your teeth and gums. Regular physical activity. Eating a healthy diet. Avoiding tobacco and drug use. Limiting alcohol use. Practicing safe sex. Taking low-dose aspirin every day. Taking vitamin and mineral supplements as recommended by your health care provider. What happens during an annual well check? The services and screenings done by your health care provider during your annual well check will depend on your age, overall health, lifestyle risk factors, and family history of disease. Counseling  Your health care provider may ask you questions about your: Alcohol use. Tobacco  use. Drug use. Emotional well-being. Home and relationship well-being. Sexual activity. Eating habits. History of falls. Memory and ability to understand (cognition). Work and work Statistician. Reproductive health. Screening  You may have the following tests or measurements: Height, weight, and BMI. Blood pressure. Lipid and cholesterol levels. These may be checked every 5 years, or more frequently if you are over 71 years old. Skin check. Lung cancer screening. You may have this screening every year starting at age 11 if you have a 30-pack-year history of smoking and currently smoke or have quit within the past 15 years. Fecal occult blood test (FOBT) of the stool. You may have this test every year starting at age 24. Flexible sigmoidoscopy or colonoscopy. You may have a sigmoidoscopy every 5 years or a colonoscopy every 10 years starting at age 53. Hepatitis C blood test. Hepatitis B blood test. Sexually transmitted disease (STD) testing. Diabetes screening. This is done by checking your blood sugar (glucose) after you have not eaten for a while (fasting). You may have this done every 1-3 years. Bone density scan. This is done to screen for osteoporosis. You may have this done starting at age 44. Mammogram. This may be done every 1-2 years. Talk to your health care provider about how often you should have regular mammograms. Talk with your health care provider about your test results, treatment options, and if necessary, the need for more tests. Vaccines  Your health care provider may recommend certain vaccines, such as: Influenza vaccine. This is recommended every year. Tetanus, diphtheria, and acellular pertussis (Tdap, Td) vaccine. You may need a Td booster every 10 years. Zoster vaccine. You may need this after age 50. Pneumococcal 13-valent conjugate (PCV13) vaccine. One dose is recommended after age 46. Pneumococcal polysaccharide (PPSV23) vaccine. One dose is recommended  after age 80. Talk to your health care provider about which screenings and vaccines you need and how often you need them. This information is not intended to replace advice given to you by your health care provider. Make sure you discuss any questions you have with your health care provider. Document Released: 08/27/2015 Document Revised: 04/19/2016 Document Reviewed: 06/01/2015 Elsevier Interactive Patient Education  2017 [employer] Prevention in the Home Falls can cause injuries. They can happen to people of all ages. There are many things you can do to make your home safe and to help prevent falls. What can I do on the outside of my home? Regularly fix the edges of walkways and driveways and fix any cracks. Remove anything that might make you trip as you walk through a door, such as a raised step or threshold. Trim any bushes or trees on the path to your home. Use bright outdoor lighting. Clear any walking paths of anything that might make someone trip, such as rocks or tools. Regularly check to see if handrails are loose or broken. Make sure that both sides of any steps have handrails. Any raised decks and porches should have guardrails on the edges. Have any leaves, snow, or ice cleared regularly. Use sand or salt on walking paths during winter. Clean up any spills in your garage right away. This includes oil or grease spills. What can I do in the bathroom? Use night lights. Install grab bars by the toilet and in the tub and shower. Do not use towel bars as grab bars. Use non-skid mats or decals in the tub or shower. If you need to sit down in the shower, use a plastic, non-slip stool. Keep the floor dry. Clean up any water that spills on the floor as soon as it happens. Remove soap buildup in the tub or shower regularly. Attach bath mats securely with double-sided non-slip rug tape. Do not have throw rugs and other things on the floor that can make you trip. What can I do  in the bedroom? Use night lights. Make sure that you have a light by your bed that is easy to reach. Do not use any sheets or blankets that are too big for your bed. They should not hang down onto the floor. Have a firm chair that has side arms. You can use this for support while you get dressed. Do not have throw rugs and other things on the floor that can make you trip. What can I do in the kitchen? Clean up any spills right away. Avoid walking on wet floors. Keep items that you use a lot in easy-to-reach places. If you need to reach something above you, use a strong step stool that has a grab bar. Keep electrical cords out of the way. Do not use floor polish or wax that makes floors slippery. If you must use wax, use non-skid floor wax. Do not have throw rugs and other things on the floor that can make you trip. What can I do with my stairs? Do not leave any items on the stairs. Make sure that there are handrails on both sides of the stairs and use them. Fix handrails that are broken or loose. Make sure that handrails are as long as the stairways. Check any carpeting to make sure that it is firmly attached to the stairs. Fix any carpet that is loose or worn. Avoid having throw rugs at the top or bottom of the stairs. If you do  have throw rugs, attach them to the floor with carpet tape. Make sure that you have a light switch at the top of the stairs and the bottom of the stairs. If you do not have them, ask someone to add them for you. What else can I do to help prevent falls? Wear shoes that: Do not have high heels. Have rubber bottoms. Are comfortable and fit you well. Are closed at the toe. Do not wear sandals. If you use a stepladder: Make sure that it is fully opened. Do not climb a closed stepladder. Make sure that both sides of the stepladder are locked into place. Ask someone to hold it for you, if possible. Clearly mark and make sure that you can see: Any grab bars or  handrails. First and last steps. Where the edge of each step is. Use tools that help you move around (mobility aids) if they are needed. These include: Canes. Walkers. Scooters. Crutches. Turn on the lights when you go into a dark area. Replace any light bulbs as soon as they burn out. Set up your furniture so you have a clear path. Avoid moving your furniture around. If any of your floors are uneven, fix them. If there are any pets around you, be aware of where they are. Review your medicines with your doctor. Some medicines can make you feel dizzy. This can increase your chance of falling. Ask your doctor what other things that you can do to help prevent falls. This information is not intended to replace advice given to you by your health care provider. Make sure you discuss any questions you have with your health care provider. Document Released: 05/27/2009 Document Revised: 01/06/2016 Document Reviewed: 09/04/2014 Elsevier Interactive Patient Education  2017 [employer].

## 2021-06-22 NOTE — Progress Notes (Signed)
I connected with Julie Obrien today by telephone and verified that I am speaking with the correct person using two identifiers. Location patient: home Location provider: work Persons participating in the virtual visit: Julie, Everly LPN.   I discussed the limitations, risks, security and privacy concerns of performing an evaluation and management service by telephone and the availability of in person appointments. I also discussed with the patient that there may be a patient responsible charge related to this service. The patient expressed understanding and verbally consented to this telephonic visit.    Interactive audio and video telecommunications were attempted between this provider and patient, however failed, due to patient having technical difficulties OR patient did not have access to video capability.  We continued and completed visit with audio only.     Vital signs may be patient reported or missing.  Subjective:   Julie Obrien is a 70 y.o. female who presents for Medicare Annual (Subsequent) preventive examination.  Review of Systems     Cardiac Risk Factors include: advanced age (>11men, >38 women);obesity (BMI >30kg/m2);sedentary lifestyle;smoking/ tobacco exposure     Objective:    Today's Vitals   06/22/21 0941  Weight: 170 lb (77.1 kg)  Height: 5\' 3"  (1.6 m)   Body mass index is 30.11 kg/m.  Advanced Directives 06/22/2021 03/04/2021 02/17/2021 06/16/2020 09/11/2019 04/23/2019 12/03/2018  Does Patient Have a Medical Advance Directive? No No No No No No No  Would patient like information on creating a medical advance directive? - No - Patient declined No - Patient declined - No - Patient declined - No - Patient declined    Current Medications (verified) Outpatient Encounter Medications as of 06/22/2021  Medication Sig   acetaminophen (TYLENOL) 650 MG CR tablet Take 650 mg by mouth daily. Takes three tablets a few times a week. Does not take it every  day.   albuterol (VENTOLIN HFA) 108 (90 Base) MCG/ACT inhaler Inhale 1-2 puffs into the lungs every 4 (four) hours as needed for wheezing or shortness of breath.   methocarbamol (ROBAXIN) 750 MG tablet Take 1 tablet (750 mg total) by mouth every 6 (six) hours as needed for muscle spasms.   oxyCODONE (OXY IR/ROXICODONE) 5 MG immediate release tablet Take 1 tablet (5 mg total) by mouth every 4 (four) hours as needed for severe pain or moderate pain.   oxyCODONE-acetaminophen (PERCOCET) 5-325 MG tablet Take 1 tablet by mouth every 4 (four) hours as needed for severe pain.   Vitamin D, Ergocalciferol, (DRISDOL) 1.25 MG (50000 UNIT) CAPS capsule TAKE 1 CAPSULE (50,000 UNITS TOTAL) BY MOUTH 2 (TWO) TIMES A WEEK. TAKE ON TUES & FRI   cyclobenzaprine (FLEXERIL) 5 MG tablet Take 1 tablet (5 mg total) by mouth 3 (three) times daily as needed for muscle spasms. (Patient not taking: Reported on 06/22/2021)   doxycycline (MONODOX) 100 MG capsule Take 100 mg by mouth 2 (two) times daily. (Patient not taking: Reported on 06/22/2021)   Omega-3 Fatty Acids (OMEGA 3 PO) Take 1 capsule by mouth 2 (two) times daily after a meal. Omega XL (Patient not taking: Reported on 06/22/2021)   No facility-administered encounter medications on file as of 06/22/2021.    Allergies (verified) Nisoldipine, Augmentin [amoxicillin-pot clavulanate], and Sulfa antibiotics   History: Past Medical History:  Diagnosis Date   Arthritis    hnp, low back , knees - " all OA"   Complication of anesthesia    nausea one surgery more recent   History of kidney  stones    Pneumonia    Feb.- Mar. 2017, not hospitalized, but pending surgery was cancelled ,since then, pt. seen by PCP, told that it was cleared     PONV (postoperative nausea and vomiting)    Past Surgical History:  Procedure Laterality Date   BACK SURGERY  08   CERVICAL DISCECTOMY  09   CESAREAN SECTION     x4   CYST EXCISION     vocal cords?   CYSTO  99   stone    HARDWARE REMOVAL N/A 06/21/2015   Procedure: Exploration of Fusion and removal of hardware right thoracic level one;  Surgeon: Kary Kos, MD;  Location: [facility];  Service: Neurosurgery;  Laterality: N/A;   LAMINECTOMY WITH POSTERIOR LATERAL ARTHRODESIS LEVEL 2 N/A 03/04/2021   Procedure: posterior lateral fusion - Thoracic eleven-Thoracic twelve - Thoracic twelve-Lumbar one, Redo Lumbar One-Two Posterior Lumbar Interbody Fusion;  Surgeon: Kary Kos, MD;  Location: [facility];  Service: Neurosurgery;  Laterality: N/A;   POSTERIOR CERVICAL FUSION/FORAMINOTOMY N/A 05/31/2015   Procedure: Decompression and Laminectomy Cervical three-four  Cervical six-seven  Cervical seven-thoracic-one Posterior cervical fusion with lateral mass fixation;  Surgeon: Kary Kos, MD;  Location: [facility];  Service: Neurosurgery;  Laterality: N/A;   TONSILLECTOMY     TUBAL LIGATION     Family History  Problem Relation Age of Onset   Dementia Mother    Cancer Mother    Cancer Sister    Cancer Maternal Aunt    Social History   Socioeconomic History   Marital status: Married    Spouse name: Nehal Witting Sr.   Number of children: 4   Years of education: Not on file   Highest education level: Not on file  Occupational History   Occupation: retired  Tobacco Use   Smoking status: Every Day    Packs/day: 0.25    Years: 32.00    Pack years: 8.00    Types: Cigarettes   Smokeless tobacco: Never   Tobacco comments:    "I know I have to tell myself this is what I want to do but I am not ready to do that yet"  Vaping Use   Vaping Use: Never used  Substance and Sexual Activity   Alcohol use: No   Drug use: No   Sexual activity: Not Currently  Other Topics Concern   Not on file  Social History Narrative   Not on file   Social Determinants of Health   Financial Resource Strain: Low Risk    Difficulty of Paying Living Expenses: Not hard at all  Food Insecurity: No Food Insecurity   Worried About Paediatric nurse in the Last Year: Never true   [place] in the Last Year: Never true  Transportation Needs: No Transportation Needs   Lack of Transportation (Medical): No   Lack of Transportation (Non-Medical): No  Physical Activity: Inactive   Days of Exercise per Week: 0 days   Minutes of Exercise per Session: 0 min  Stress: No Stress Concern Present   Feeling of Stress : Not at all  Social Connections: Not on file    Tobacco Counseling Ready to quit: No Counseling given: Not Answered Tobacco comments: "I know I have to tell myself this is what I want to do but I am not ready to do that yet"   Clinical Intake:  Pre-visit preparation completed: Yes  Pain : No/denies pain     Nutritional Status: BMI >  30  Obese Nutritional Risks: None Diabetes: No  How often do you need to have someone help you when you read instructions, pamphlets, or other written materials from your doctor or pharmacy?: 1 - Never What is the last grade level you completed in school?: 14 yrs  Diabetic? no  Interpreter Needed?: No  Information entered by :: NAllen LPN   Activities of Daily Living In your present state of health, do you have any difficulty performing the following activities: 06/22/2021 03/05/2021  Hearing? N N  Vision? N N  Difficulty concentrating or making decisions? N N  Walking or climbing stairs? N Y  Dressing or bathing? N N  Doing errands, shopping? N N  Preparing Food and eating ? N -  Using the Toilet? N -  In the past six months, have you accidently leaked urine? N -  Do you have problems with loss of bowel control? N -  Managing your Medications? N -  Managing your Finances? N -  Housekeeping or managing your Housekeeping? N -  Some recent data might be hidden    Patient Care Team: Glendale Chard, MD as PCP - General  Indicate any recent Medical Services you may have received from other than [facility] providers in the past year (date may be approximate).      Assessment:   This is a routine wellness examination for Julie Obrien.  Hearing/Vision screen Vision Screening - Comments:: Regular eye exams, [facility]  Dietary issues and exercise activities discussed: Current Exercise Habits: The patient does not participate in regular exercise at present   Goals Addressed             This Visit's Progress    Patient Stated       06/22/2021, walk a little bit straighter       Depression Screen PHQ 2/9 Scores 06/22/2021 06/16/2020 06/26/2019 04/23/2019 12/03/2018 09/11/2018 08/20/2018  PHQ - 2 Score 0 0 0 0 0 0 0    Fall Risk Fall Risk  06/22/2021 06/16/2020 06/26/2019 04/23/2019 09/11/2018  Falls in the past year? 0 0 0 0 0  Comment - - - - -  Risk for fall due to : Medication side effect No Fall Risks - - -  Follow up Falls evaluation completed;Education provided;Falls prevention discussed Falls evaluation completed;Education provided;Falls prevention discussed - Falls evaluation completed;Education provided;Falls prevention discussed -    FALL RISK PREVENTION PERTAINING TO THE HOME:  Any stairs in or around the home? Yes  If so, are there any without handrails? No  Home free of loose throw rugs in walkways, pet beds, electrical cords, etc? Yes  Adequate lighting in your home to reduce risk of falls? Yes   ASSISTIVE DEVICES UTILIZED TO PREVENT FALLS:  Life alert? No  Use of a cane, walker or w/c? No  Grab bars in the bathroom? No  Shower chair or bench in shower? No  Elevated toilet seat or a handicapped toilet? No   TIMED UP AND GO:  Was the test performed? No .      Cognitive Function:     6CIT Screen 06/22/2021 06/16/2020 04/23/2019  What Year? 0 points 0 points 0 points  What month? 0 points 0 points 0 points  What time? 0 points 0 points 0 points  Count back from 20 0 points 0 points 0 points  Months in reverse 0 points 0 points 0 points  Repeat phrase 0 points 0 points 0 points  Total Score 0 0 0  Immunizations Immunization History  Administered Date(s) Administered   PFIZER(Purple Top)SARS-COV-2 Vaccination 09/20/2019, 10/11/2019   Pneumococcal Polysaccharide-23 05/09/2010   Tdap 09/18/2010    TDAP status: Due, Education has been provided regarding the importance of this vaccine. Advised may receive this vaccine at local pharmacy or Health Dept. Aware to provide a copy of the vaccination record if obtained from local pharmacy or Health Dept. Verbalized acceptance and understanding.  Flu Vaccine status: Declined, Education has been provided regarding the importance of this vaccine but patient still declined. Advised may receive this vaccine at local pharmacy or Health Dept. Aware to provide a copy of the vaccination record if obtained from local pharmacy or Health Dept. Verbalized acceptance and understanding.  Pneumococcal vaccine status: Declined,  Education has been provided regarding the importance of this vaccine but patient still declined. Advised may receive this vaccine at local pharmacy or Health Dept. Aware to provide a copy of the vaccination record if obtained from local pharmacy or Health Dept. Verbalized acceptance and understanding.   Covid-19 vaccine status: Completed vaccines  Qualifies for Shingles Vaccine? Yes   Zostavax completed No   Shingrix Completed?: No.    Education has been provided regarding the importance of this vaccine. Patient has been advised to call insurance company to determine out of pocket expense if they have not yet received this vaccine. Advised may also receive vaccine at local pharmacy or Health Dept. Verbalized acceptance and understanding.  Screening Tests Health Maintenance  Topic Date Due   Zoster Vaccines- Shingrix (1 of 2) Never done   TETANUS/TDAP  09/18/2020   COVID-19 Vaccine (3 - Pfizer risk series) 07/08/2021 (Originally 11/08/2019)   INFLUENZA VACCINE  11/11/2021 (Originally 03/14/2021)   Pneumonia Vaccine 50+ Years old (2 -  PCV) 06/22/2022 (Originally 05/10/2011)   MAMMOGRAM  07/05/2022   COLONOSCOPY (Pts 45-29yrs Insurance coverage will need to be confirmed)  03/06/2027   DEXA SCAN  Completed   Hepatitis C Screening  Completed   HPV VACCINES  Aged Out    Health Maintenance  Health Maintenance Due  Topic Date Due   Zoster Vaccines- Shingrix (1 of 2) Never done   TETANUS/TDAP  09/18/2020    Colorectal cancer screening: Type of screening: Colonoscopy. Completed 03/05/2017. Repeat every 7 years  Mammogram status: Completed 07/05/2020. Repeat every year  Bone Density status: Completed 06/26/2019.  Lung Cancer Screening: (Low Dose CT Chest recommended if Age 35-80 years, 30 pack-year currently smoking OR have quit w/in 15years.) does not qualify.   Lung Cancer Screening Referral: no  Additional Screening:  Hepatitis C Screening: does qualify; Completed 01/09/2013  Vision Screening: Recommended annual ophthalmology exams for early detection of glaucoma and other disorders of the eye. Is the patient up to date with their annual eye exam?  Yes  Who is the provider or what is the name of the office in which the patient attends annual eye exams? [facility] If pt is not established with a provider, would they like to be referred to a provider to establish care? No .   Dental Screening: Recommended annual dental exams for proper oral hygiene  Community Resource Referral / Chronic Care Management: CRR required this visit?  No   CCM required this visit?  No      Plan:     I have personally reviewed and noted the following in the patient's chart:   Medical and social history Use of alcohol, tobacco or illicit drugs  Current medications and supplements including opioid prescriptions.  Functional ability and status Nutritional status Physical activity Advanced directives List of other physicians Hospitalizations, surgeries, and ER visits in previous 12 months Vitals Screenings to include  cognitive, depression, and falls Referrals and appointments  In addition, I have reviewed and discussed with patient certain preventive protocols, quality metrics, and best practice recommendations. A written personalized care plan for preventive services as well as general preventive health recommendations were provided to patient.     Kellie Simmering, LPN   [id number]   Nurse Notes: none

## 2021-06-29 ENCOUNTER — Encounter: Payer: Medicare Other | Admitting: Internal Medicine

## 2021-07-04 DIAGNOSIS — J019 Acute sinusitis, unspecified: Secondary | ICD-10-CM | POA: Diagnosis not present

## 2021-07-04 DIAGNOSIS — R051 Acute cough: Secondary | ICD-10-CM | POA: Diagnosis not present

## 2021-07-04 DIAGNOSIS — Z20822 Contact with and (suspected) exposure to covid-19: Secondary | ICD-10-CM | POA: Diagnosis not present

## 2021-07-13 DIAGNOSIS — M65331 Trigger finger, right middle finger: Secondary | ICD-10-CM | POA: Diagnosis not present

## 2021-07-19 DIAGNOSIS — M5440 Lumbago with sciatica, unspecified side: Secondary | ICD-10-CM | POA: Diagnosis not present

## 2021-07-30 DIAGNOSIS — Z1231 Encounter for screening mammogram for malignant neoplasm of breast: Secondary | ICD-10-CM | POA: Diagnosis not present

## 2021-07-30 LAB — HM MAMMOGRAPHY

## 2021-08-02 ENCOUNTER — Encounter: Payer: Self-pay | Admitting: Internal Medicine

## 2021-09-22 DIAGNOSIS — Z20822 Contact with and (suspected) exposure to covid-19: Secondary | ICD-10-CM | POA: Diagnosis not present

## 2021-10-26 ENCOUNTER — Encounter: Payer: Medicare Other | Admitting: Internal Medicine

## 2021-11-08 DIAGNOSIS — Z20822 Contact with and (suspected) exposure to covid-19: Secondary | ICD-10-CM | POA: Diagnosis not present

## 2021-12-19 DIAGNOSIS — Z20822 Contact with and (suspected) exposure to covid-19: Secondary | ICD-10-CM | POA: Diagnosis not present

## 2022-01-11 DIAGNOSIS — M65331 Trigger finger, right middle finger: Secondary | ICD-10-CM | POA: Diagnosis not present

## 2022-01-11 DIAGNOSIS — M65322 Trigger finger, left index finger: Secondary | ICD-10-CM | POA: Diagnosis not present

## 2022-01-11 DIAGNOSIS — M65321 Trigger finger, right index finger: Secondary | ICD-10-CM | POA: Diagnosis not present

## 2022-01-31 ENCOUNTER — Other Ambulatory Visit (HOSPITAL_COMMUNITY)
Admission: RE | Admit: 2022-01-31 | Discharge: 2022-01-31 | Disposition: A | Discharge status: Home / Self Care | Payer: Medicare Other | Source: Ambulatory Visit | Attending: Internal Medicine | Admitting: Internal Medicine

## 2022-01-31 ENCOUNTER — Ambulatory Visit (INDEPENDENT_AMBULATORY_CARE_PROVIDER_SITE_OTHER): Payer: Medicare Other | Admitting: Internal Medicine

## 2022-01-31 ENCOUNTER — Encounter: Payer: Self-pay | Admitting: Internal Medicine

## 2022-01-31 VITALS — BP 120/68 | HR 83 | Temp 99.2°F | Ht 60.0 in | Wt 180.6 lb

## 2022-01-31 DIAGNOSIS — Z1151 Encounter for screening for human papillomavirus (HPV): Secondary | ICD-10-CM | POA: Insufficient documentation

## 2022-01-31 DIAGNOSIS — K219 Gastro-esophageal reflux disease without esophagitis: Secondary | ICD-10-CM | POA: Diagnosis not present

## 2022-01-31 DIAGNOSIS — Z780 Asymptomatic menopausal state: Secondary | ICD-10-CM | POA: Insufficient documentation

## 2022-01-31 DIAGNOSIS — F17210 Nicotine dependence, cigarettes, uncomplicated: Secondary | ICD-10-CM

## 2022-01-31 DIAGNOSIS — Z23 Encounter for immunization: Secondary | ICD-10-CM

## 2022-01-31 DIAGNOSIS — N760 Acute vaginitis: Secondary | ICD-10-CM | POA: Insufficient documentation

## 2022-01-31 DIAGNOSIS — Z6835 Body mass index (BMI) 35.0-35.9, adult: Secondary | ICD-10-CM

## 2022-01-31 DIAGNOSIS — E2839 Other primary ovarian failure: Secondary | ICD-10-CM

## 2022-01-31 DIAGNOSIS — Z79899 Other long term (current) drug therapy: Secondary | ICD-10-CM | POA: Diagnosis not present

## 2022-01-31 DIAGNOSIS — Z0000 Encounter for general adult medical examination without abnormal findings: Secondary | ICD-10-CM | POA: Diagnosis not present

## 2022-01-31 DIAGNOSIS — Z01419 Encounter for gynecological examination (general) (routine) without abnormal findings: Secondary | ICD-10-CM

## 2022-01-31 DIAGNOSIS — F5101 Primary insomnia: Secondary | ICD-10-CM | POA: Diagnosis not present

## 2022-01-31 LAB — POC HEMOCCULT BLD/STL (OFFICE/1-CARD/DIAGNOSTIC): Fecal Occult Blood, POC: NEGATIVE

## 2022-01-31 NOTE — Patient Instructions (Signed)

## 2022-01-31 NOTE — Progress Notes (Signed)
I,[name],acting as a scribe for Maximino Greenland, MD.,have documented all relevant documentation on the behalf of Maximino Greenland, MD,as directed by  Maximino Greenland, MD while in the presence of Maximino Greenland, MD.  This visit occurred during the SARS-CoV-2 public health emergency.  Safety protocols were in place, including screening questions prior to the visit, additional usage of staff PPE, and extensive cleaning of exam room while observing appropriate contact time as indicated for disinfecting solutions.  Subjective:     Patient ID: Julie Obrien , female    DOB: 10-01-1950 , 71 y.o.   MRN: [id number]   Chief Complaint  Patient presents with   Annual Exam    HPI  Pt is here today for HM. She was made aware her insurance does not cover physical exams. She wants to have pap smear performed as well. She is aware of possible costs and wants to move forward despite above costs.   She states she was recently treated for acute vaginitis w/ topical metronidazole. She feels her sx have since resolved.      Past Medical History:  Diagnosis Date   Arthritis    hnp, low back , knees - " all OA"   Complication of anesthesia    nausea one surgery more recent   History of kidney stones    Pneumonia    Feb.- Mar. 2017, not hospitalized, but pending surgery was cancelled ,since then, pt. seen by PCP, told that it was cleared     PONV (postoperative nausea and vomiting)      Family History  Problem Relation Age of Onset   Dementia Mother    Cancer Mother    Cancer Sister    Cancer Maternal Aunt      Current Outpatient Medications:    acetaminophen (TYLENOL) 650 MG CR tablet, Take 650 mg by mouth daily. Takes three tablets a few times a week. Does not take it every day., Disp: , Rfl:    albuterol (VENTOLIN HFA) 108 (90 Base) MCG/ACT inhaler, Inhale 1-2 puffs into the lungs every 4 (four) hours as needed for wheezing or shortness of breath., Disp: 18 g, Rfl: 11    cyclobenzaprine (FLEXERIL) 10 MG tablet, Take 10 mg by mouth 3 (three) times daily as needed., Disp: , Rfl:    methocarbamol (ROBAXIN) 750 MG tablet, Take 1 tablet (750 mg total) by mouth every 6 (six) hours as needed for muscle spasms., Disp: 40 tablet, Rfl: 0   Vitamin D, Ergocalciferol, (DRISDOL) 1.25 MG (50000 UNIT) CAPS capsule, TAKE 1 CAPSULE (50,000 UNITS TOTAL) BY MOUTH 2 (TWO) TIMES A WEEK. TAKE ON TUES & FRI, Disp: 8 capsule, Rfl: 1   atorvastatin (LIPITOR) 20 MG tablet, Take 1 tablet (20 mg total) by mouth daily., Disp: 30 tablet, Rfl: 2   fluconazole (DIFLUCAN) 150 MG tablet, Take 1 tablet (150 mg total) by mouth daily., Disp: 1 tablet, Rfl: 0   metroNIDAZOLE (FLAGYL) 500 MG tablet, Take 500 mg by mouth 2 (two) times daily., Disp: , Rfl:    Allergies  Allergen Reactions   Nisoldipine Hives   Augmentin [Amoxicillin-Pot Clavulanate] Nausea And Vomiting    "Tore up my stomach" (verified this with her daughter, a physician).     Sulfa Antibiotics Hives      The patient states she uses post menopausal status for birth control. Last LMP was No LMP recorded. Patient is postmenopausal.. Negative for Dysmenorrhea. Negative for: breast discharge, breast lump(s), breast pain and  breast self exam. Associated symptoms include abnormal vaginal bleeding. Pertinent negatives include abnormal bleeding (hematology), anxiety, decreased libido, depression, difficulty falling sleep, dyspareunia, history of infertility, nocturia, sexual dysfunction, sleep disturbances, urinary incontinence, urinary urgency, vaginal discharge and vaginal itching. Diet regular.The patient states her exercise level is  intermittent.  . The patient's tobacco use is:  Social History   Tobacco Use  Smoking Status Every Day   Packs/day: 0.25   Years: 32.00   Total pack years: 8.00   Types: Cigarettes  Smokeless Tobacco Never  Tobacco Comments   "I know I have to tell myself this is what I want to do but I am not ready to  do that yet"  . She has been exposed to passive smoke. The patient's alcohol use is:  Social History   Substance and Sexual Activity  Alcohol Use No   Review of Systems  Constitutional: Negative.   HENT: Negative.    Eyes: Negative.   Respiratory: Negative.    Cardiovascular: Negative.   Gastrointestinal: Negative.   Endocrine: Negative.   Genitourinary: Negative.   Musculoskeletal: Negative.   Skin: Negative.   Allergic/Immunologic: Negative.   Neurological: Negative.   Hematological: Negative.   Psychiatric/Behavioral: Negative.       Today's Vitals   01/31/22 1501  BP: 120/68  Pulse: 83  Temp: 99.2 F (37.3 C)  Weight: 180 lb 9.6 oz (81.9 kg)  Height: 5' (1.524 m)   Body mass index is 35.27 kg/m.  Wt Readings from Last 3 Encounters:  01/31/22 180 lb 9.6 oz (81.9 kg)  06/22/21 170 lb (77.1 kg)  03/04/21 182 lb (82.6 kg)     Objective:  Physical Exam Vitals and nursing note reviewed. Exam conducted with a chaperone present.  Constitutional:      Appearance: Normal appearance.  HENT:     Head: Normocephalic and atraumatic.     Right Ear: Tympanic membrane, ear canal and external ear normal.     Left Ear: Tympanic membrane, ear canal and external ear normal.     Nose: Nose normal.     Mouth/Throat:     Mouth: Mucous membranes are moist.     Pharynx: Oropharynx is clear.  Eyes:     Extraocular Movements: Extraocular movements intact.     Conjunctiva/sclera: Conjunctivae normal.     Pupils: Pupils are equal, round, and reactive to light.  Cardiovascular:     Rate and Rhythm: Normal rate and regular rhythm.     Pulses: Normal pulses.     Heart sounds: Normal heart sounds.  Pulmonary:     Effort: Pulmonary effort is normal.     Breath sounds: Normal breath sounds.  Abdominal:     General: Bowel sounds are normal.     Palpations: Abdomen is soft.     Hernia: There is no hernia in the left inguinal area or right inguinal area.  Genitourinary:    General:  Normal vulva.     Exam position: Lithotomy position.     Tanner stage (genital): 5.     Labia:        Right: No rash or tenderness.        Left: No rash or tenderness.      Vagina: Normal. No vaginal discharge.     Cervix: Normal.     Uterus: Normal.      Adnexa:        Left: No mass or tenderness.       Rectum: Normal. Guaiac result negative.  Musculoskeletal:     Cervical back: Normal range of motion and neck supple.  Lymphadenopathy:     Lower Body: No right inguinal adenopathy. No left inguinal adenopathy.  Skin:    General: Skin is warm and dry.  Neurological:     General: No focal deficit present.     Mental Status: She is alert and oriented to person, place, and time.  Psychiatric:        Mood and Affect: Mood normal.        Behavior: Behavior normal.      Assessment And Plan:     1. Encounter for physical examination Comments: A full exam was performed. Importance of monthly self breast exams was dscussed with the patient. Pelvic exam also performed, stool heme negative. PATIENT IS ADVISED TO GET 30-45 MINUTES REGULAR EXERCISE NO LESS THAN FOUR TO FIVE DAYS PER WEEK - BOTH WEIGHTBEARING EXERCISES AND AEROBIC ARE RECOMMENDED.  PATIENT IS ADVISED TO FOLLOW A HEALTHY DIET WITH AT LEAST SIX FRUITS/VEGGIES PER DAY, DECREASE INTAKE OF RED MEAT, AND TO INCREASE FISH INTAKE TO TWO DAYS PER WEEK.  MEATS/FISH SHOULD NOT BE FRIED, BAKED OR BROILED IS PREFERABLE.  IT IS ALSO IMPORTANT TO CUT BACK ON YOUR SUGAR INTAKE. PLEASE AVOID ANYTHING WITH ADDED SUGAR, CORN SYRUP OR OTHER SWEETENERS. IF YOU MUST USE A SWEETENER, YOU CAN TRY STEVIA. IT IS ALSO IMPORTANT TO AVOID ARTIFICIALLY SWEETENERS AND DIET BEVERAGES. LASTLY, I SUGGEST WEARING SPF 50 SUNSCREEN ON EXPOSED PARTS AND ESPECIALLY WHEN IN THE DIRECT SUNLIGHT FOR AN EXTENDED PERIOD OF TIME.  PLEASE AVOID FAST FOOD RESTAURANTS AND INCREASE YOUR WATER INTAKE.   2. Acute vaginitis Comments: Resolving. Pap smear performed.  - Cytology -Pap  Smear  3. Gastroesophageal reflux disease without esophagitis Comments: She is reminded to stop eating 3 hrs prior to lying down. She was also given information on GERD and foods to avoid.   4. Primary insomnia Comments: Chronic, she is encouraged to adopt a bedtime regimen. Her sx are likely exacerbated due to concern re; her children.   5. Tobacco dependence due to cigarettes Comments: She is encouraged to decrease number of cigs smoked per day, she does not wish to quit at this time.  Smoking cessation instruction/counseling given:  counseled patient on the dangers of tobacco use, advised patient to stop smoking, and reviewed strategies to maximize success.   6. Class 2 severe obesity due to excess calories with serious comorbidity and body mass index (BMI) of 35.0 to 35.9 in adult [facility]) Comments: She has gained 10 lbs since Nov 2022. She is encouraged to aim for at least 150 minutes of exercise/week, while striving for BMI<30 to decrease cardiac risk.  7. Drug therapy - CMP14+EGFR - CBC - Lipid panel  8. Immunization due Comments: I will send rx Shingrix to her local pharmacy.   Patient was given opportunity to ask questions. Patient verbalized understanding of the plan and was able to repeat key elements of the plan. All questions were answered to their satisfaction.   I, Maximino Greenland, MD, have reviewed all documentation for this visit. The documentation on 02/05/22 for the exam, diagnosis, procedures, and orders are all accurate and complete.   THE PATIENT IS ENCOURAGED TO PRACTICE SOCIAL DISTANCING DUE TO THE COVID-19 PANDEMIC.

## 2022-02-01 LAB — CBC
Hematocrit: 42.4 % (ref 34.0–46.6)
Hemoglobin: 13.6 g/dL (ref 11.1–15.9)
MCH: 27 pg (ref 26.6–33.0)
MCHC: 32.1 g/dL (ref 31.5–35.7)
MCV: 84 fL (ref 79–97)
Platelets: 309 10*3/uL (ref 150–450)
RBC: 5.04 x10E6/uL (ref 3.77–5.28)
RDW: 13.6 % (ref 11.7–15.4)
WBC: 7.7 10*3/uL (ref 3.4–10.8)

## 2022-02-01 LAB — CMP14+EGFR
ALT: 14 IU/L (ref 0–32)
AST: 14 IU/L (ref 0–40)
Albumin/Globulin Ratio: 2.5 — ABNORMAL HIGH (ref 1.2–2.2)
Albumin: 4.9 g/dL — ABNORMAL HIGH (ref 3.7–4.7)
Alkaline Phosphatase: 112 IU/L (ref 44–121)
BUN/Creatinine Ratio: 10 — ABNORMAL LOW (ref 12–28)
BUN: 8 mg/dL (ref 8–27)
Bilirubin Total: 0.3 mg/dL (ref 0.0–1.2)
CO2: 22 mmol/L (ref 20–29)
Calcium: 10.4 mg/dL — ABNORMAL HIGH (ref 8.7–10.3)
Chloride: 103 mmol/L (ref 96–106)
Creatinine, Ser: 0.77 mg/dL (ref 0.57–1.00)
Globulin, Total: 2 g/dL (ref 1.5–4.5)
Glucose: 86 mg/dL (ref 70–99)
Potassium: 5.1 mmol/L (ref 3.5–5.2)
Sodium: 142 mmol/L (ref 134–144)
Total Protein: 6.9 g/dL (ref 6.0–8.5)
eGFR: 82 mL/min/{1.73_m2} (ref >59)

## 2022-02-01 LAB — LIPID PANEL
Chol/HDL Ratio: 3.1 ratio (ref 0.0–4.4)
Cholesterol, Total: 217 mg/dL — ABNORMAL HIGH (ref 100–199)
HDL: 71 mg/dL (ref >39)
LDL Chol Calc (NIH): 127 mg/dL — ABNORMAL HIGH (ref 0–99)
Triglycerides: 109 mg/dL (ref 0–149)
VLDL Cholesterol Cal: 19 mg/dL (ref 5–40)

## 2022-02-03 LAB — CYTOLOGY - PAP
Comment: NEGATIVE
Diagnosis: NEGATIVE
High risk HPV: NEGATIVE

## 2022-02-05 ENCOUNTER — Encounter: Payer: Self-pay | Admitting: Internal Medicine

## 2022-02-05 MED ORDER — SHINGRIX 50 MCG/0.5ML IM SUSR: 0.5000 mL | Freq: Once | INTRAMUSCULAR | 0 refills | Status: AC

## 2022-02-06 ENCOUNTER — Encounter: Payer: Self-pay | Admitting: Internal Medicine

## 2022-02-06 ENCOUNTER — Other Ambulatory Visit: Payer: Self-pay | Admitting: Internal Medicine

## 2022-02-06 MED ORDER — FLUCONAZOLE 150 MG PO TABS: 150.0000 mg | ORAL_TABLET | Freq: Every day | ORAL | 0 refills | Status: DC

## 2022-02-07 DIAGNOSIS — M5440 Lumbago with sciatica, unspecified side: Secondary | ICD-10-CM | POA: Diagnosis not present

## 2022-02-10 ENCOUNTER — Other Ambulatory Visit: Payer: Self-pay

## 2022-02-10 MED ORDER — ATORVASTATIN CALCIUM 20 MG PO TABS: 20.0000 mg | ORAL_TABLET | Freq: Every day | ORAL | 2 refills | Status: DC

## 2022-03-30 ENCOUNTER — Ambulatory Visit: Payer: Medicare Other | Admitting: Internal Medicine

## 2022-03-31 DIAGNOSIS — M19012 Primary osteoarthritis, left shoulder: Secondary | ICD-10-CM | POA: Diagnosis not present

## 2022-04-05 ENCOUNTER — Encounter: Payer: Self-pay | Admitting: Internal Medicine

## 2022-04-05 ENCOUNTER — Ambulatory Visit (INDEPENDENT_AMBULATORY_CARE_PROVIDER_SITE_OTHER): Payer: Medicare Other | Admitting: Internal Medicine

## 2022-04-05 VITALS — BP 128/76 | HR 79 | Temp 98.0°F | Ht 60.0 in | Wt 184.2 lb

## 2022-04-05 DIAGNOSIS — M25512 Pain in left shoulder: Secondary | ICD-10-CM

## 2022-04-05 DIAGNOSIS — G8929 Other chronic pain: Secondary | ICD-10-CM | POA: Diagnosis not present

## 2022-04-05 DIAGNOSIS — E2839 Other primary ovarian failure: Secondary | ICD-10-CM | POA: Diagnosis not present

## 2022-04-05 DIAGNOSIS — E7800 Pure hypercholesterolemia, unspecified: Secondary | ICD-10-CM | POA: Diagnosis not present

## 2022-04-05 DIAGNOSIS — Z6835 Body mass index (BMI) 35.0-35.9, adult: Secondary | ICD-10-CM | POA: Diagnosis not present

## 2022-04-05 DIAGNOSIS — Z23 Encounter for immunization: Secondary | ICD-10-CM

## 2022-04-05 MED ORDER — ATORVASTATIN CALCIUM 20 MG PO TABS: 20.0000 mg | ORAL_TABLET | Freq: Every day | ORAL | 1 refills | Status: DC

## 2022-04-05 MED ORDER — TETANUS-DIPHTH-ACELL PERTUSSIS 5-2.5-18.5 LF-MCG/0.5 IM SUSP: 0.5000 mL | Freq: Once | INTRAMUSCULAR | 0 refills | Status: AC

## 2022-04-05 NOTE — Patient Instructions (Signed)

## 2022-04-05 NOTE — Progress Notes (Signed)
I,[name],acting as a scribe for Maximino Greenland, MD.,have documented all relevant documentation on the behalf of Maximino Greenland, MD,as directed by  Maximino Greenland, MD while in the presence of Maximino Greenland, MD.    Subjective:     Patient ID: Julie Obrien , female    DOB: Jun 06, 1951 , 71 y.o.   MRN: [id number]   Chief Complaint  Patient presents with   Hyperlipidemia    HPI  Pt presents today for 6-8 week chol f/u. She currently takes atorvastatin '20MG'$  daily. She has no specific concerns or complaints at this time.   She adds, 30 day supply of medication given to her initially she has completed. She did not pick up the refill. She has no specific questions or concerns at this time.     Past Medical History:  Diagnosis Date   Arthritis    hnp, low back , knees - " all OA"   Complication of anesthesia    nausea one surgery more recent   History of kidney stones    Pneumonia    Feb.- Mar. 2017, not hospitalized, but pending surgery was cancelled ,since then, pt. seen by PCP, told that it was cleared     PONV (postoperative nausea and vomiting)      Family History  Problem Relation Age of Onset   Dementia Mother    Cancer Mother    Cancer Sister    Cancer Maternal Aunt      Current Outpatient Medications:    acetaminophen (TYLENOL) 650 MG CR tablet, Take 650 mg by mouth daily. Takes three tablets a few times a week. Does not take it every day., Disp: , Rfl:    albuterol (VENTOLIN HFA) 108 (90 Base) MCG/ACT inhaler, Inhale 1-2 puffs into the lungs every 4 (four) hours as needed for wheezing or shortness of breath., Disp: 18 g, Rfl: 11   cyclobenzaprine (FLEXERIL) 10 MG tablet, Take 10 mg by mouth 3 (three) times daily as needed., Disp: , Rfl:    methocarbamol (ROBAXIN) 750 MG tablet, Take 1 tablet (750 mg total) by mouth every 6 (six) hours as needed for muscle spasms., Disp: 40 tablet, Rfl: 0   metroNIDAZOLE (FLAGYL) 500 MG tablet, Take 500 mg by mouth  2 (two) times daily., Disp: , Rfl:    Tdap (BOOSTRIX) 5-2.5-18.5 LF-MCG/0.5 injection, Inject 0.5 mLs into the muscle once for 1 dose., Disp: 0.5 mL, Rfl: 0   Vitamin D, Ergocalciferol, (DRISDOL) 1.25 MG (50000 UNIT) CAPS capsule, TAKE 1 CAPSULE (50,000 UNITS TOTAL) BY MOUTH 2 (TWO) TIMES A WEEK. TAKE ON TUES & FRI, Disp: 8 capsule, Rfl: 1   atorvastatin (LIPITOR) 20 MG tablet, Take 1 tablet (20 mg total) by mouth daily., Disp: 90 tablet, Rfl: 1   fluconazole (DIFLUCAN) 150 MG tablet, Take 1 tablet (150 mg total) by mouth daily. (Patient not taking: Reported on 04/05/2022), Disp: 1 tablet, Rfl: 0   Allergies  Allergen Reactions   Nisoldipine Hives   Augmentin [Amoxicillin-Pot Clavulanate] Nausea And Vomiting    "Tore up my stomach" (verified this with her daughter, a physician).     Sulfa Antibiotics Hives     Review of Systems  Constitutional: Negative.   Respiratory: Negative.    Cardiovascular: Negative.   Neurological: Negative.   Psychiatric/Behavioral: Negative.       Today's Vitals   04/05/22 0841  BP: 128/76  Pulse: 79  Temp: 98 F (36.7 C)  SpO2: 96%  Weight: 184  lb 3.2 oz (83.6 kg)  Height: 5' (1.524 m)  PainSc: 0-No pain   Body mass index is 35.97 kg/m.  Wt Readings from Last 3 Encounters:  04/05/22 184 lb 3.2 oz (83.6 kg)  01/31/22 180 lb 9.6 oz (81.9 kg)  06/22/21 170 lb (77.1 kg)    Objective:  Physical Exam Vitals and nursing note reviewed.  Constitutional:      Appearance: Normal appearance. She is obese.  HENT:     Head: Normocephalic and atraumatic.  Eyes:     Extraocular Movements: Extraocular movements intact.  Cardiovascular:     Rate and Rhythm: Normal rate and regular rhythm.     Heart sounds: Normal heart sounds.  Pulmonary:     Effort: Pulmonary effort is normal.     Breath sounds: Normal breath sounds.  Musculoskeletal:     Cervical back: Normal range of motion.  Skin:    General: Skin is warm.  Neurological:     General: No focal  deficit present.     Mental Status: She is alert.  Psychiatric:        Mood and Affect: Mood normal.        Behavior: Behavior normal.       Assessment And Plan:     1. Pure hypercholesterolemia Comments: She has been taking atorvastatin '20mg'$  daily without any issues. I will check lipid/liver panel today.  - Lipid panel; Future - Liver Profile; Future  2. Estrogen deficiency Comments: Bone density ordered in July, she has yet to hear about appt. I will speak to referral coordinator about this today.   3. Chronic left shoulder pain Comments: She reports recent diagnosis of tendinitis by Urgent Care. I will request these records.   4. Class 2 severe obesity due to excess calories with serious comorbidity and body mass index (BMI) of 35.0 to 35.9 in adult [facility]) Comments: She is aware of 4lb weight gain. She is encouraged to aim for at least 150 minutes of exercise per week, while striving for BMI<30 to decrease cardiac risk.   5. Immunization due Comments: She is unable to get Tdap here, I will send rx to her local pharmacy.   Patient was given opportunity to ask questions. Patient verbalized understanding of the plan and was able to repeat key elements of the plan. All questions were answered to their satisfaction.   I, Maximino Greenland, MD, have reviewed all documentation for this visit. The documentation on 04/05/22 for the exam, diagnosis, procedures, and orders are all accurate and complete.   IF YOU HAVE BEEN REFERRED TO A SPECIALIST, IT MAY TAKE 1-2 WEEKS TO SCHEDULE/PROCESS THE REFERRAL. IF YOU HAVE NOT HEARD FROM US/SPECIALIST IN TWO WEEKS, PLEASE GIVE [place] A CALL AT [phone].   THE PATIENT IS ENCOURAGED TO PRACTICE SOCIAL DISTANCING DUE TO THE COVID-19 PANDEMIC.

## 2022-04-07 DIAGNOSIS — Z780 Asymptomatic menopausal state: Secondary | ICD-10-CM | POA: Diagnosis not present

## 2022-04-07 LAB — HM DEXA SCAN

## 2022-04-12 DIAGNOSIS — K635 Polyp of colon: Secondary | ICD-10-CM | POA: Diagnosis not present

## 2022-04-12 DIAGNOSIS — Z1211 Encounter for screening for malignant neoplasm of colon: Secondary | ICD-10-CM | POA: Diagnosis not present

## 2022-04-12 DIAGNOSIS — Z800 Family history of malignant neoplasm of digestive organs: Secondary | ICD-10-CM | POA: Diagnosis not present

## 2022-04-12 LAB — HM COLONOSCOPY

## 2022-04-14 DIAGNOSIS — K635 Polyp of colon: Secondary | ICD-10-CM | POA: Diagnosis not present

## 2022-04-18 ENCOUNTER — Encounter: Payer: Self-pay | Admitting: Internal Medicine

## 2022-05-03 ENCOUNTER — Other Ambulatory Visit: Payer: Medicare Other

## 2022-05-03 DIAGNOSIS — E7800 Pure hypercholesterolemia, unspecified: Secondary | ICD-10-CM | POA: Diagnosis not present

## 2022-05-04 LAB — HEPATIC FUNCTION PANEL
ALT: 11 IU/L (ref 0–32)
AST: 18 IU/L (ref 0–40)
Albumin: 4.6 g/dL (ref 3.8–4.8)
Alkaline Phosphatase: 113 IU/L (ref 44–121)
Bilirubin Total: 0.3 mg/dL (ref 0.0–1.2)
Bilirubin, Direct: <0.1 mg/dL (ref 0.00–0.40)
Total Protein: 6.8 g/dL (ref 6.0–8.5)

## 2022-05-04 LAB — LIPID PANEL
Chol/HDL Ratio: 2.3 ratio (ref 0.0–4.4)
Cholesterol, Total: 183 mg/dL (ref 100–199)
HDL: 80 mg/dL (ref >39)
LDL Chol Calc (NIH): 90 mg/dL (ref 0–99)
Triglycerides: 69 mg/dL (ref 0–149)
VLDL Cholesterol Cal: 13 mg/dL (ref 5–40)

## 2022-06-28 ENCOUNTER — Ambulatory Visit: Payer: Medicare Other

## 2022-06-28 ENCOUNTER — Ambulatory Visit (INDEPENDENT_AMBULATORY_CARE_PROVIDER_SITE_OTHER): Payer: Medicare Other

## 2022-06-28 VITALS — BP 110/60 | HR 83 | Temp 98.1°F | Ht 62.0 in | Wt 190.2 lb

## 2022-06-28 DIAGNOSIS — Z0000 Encounter for general adult medical examination without abnormal findings: Secondary | ICD-10-CM

## 2022-06-28 NOTE — Patient Instructions (Signed)
Ms. Julie Obrien , Thank you for taking time to come for your Medicare Wellness Visit. I appreciate your ongoing commitment to your health goals. Please review the following plan we discussed and let me know if I can assist you in the future.   Screening recommendations/referrals: Colonoscopy: completed 04/12/2022 Mammogram: completed 07/30/2021, due 07/31/2022 Bone Density: completed 04/07/2022 Recommended yearly ophthalmology/optometry visit for glaucoma screening and checkup Recommended yearly dental visit for hygiene and checkup  Vaccinations: Influenza vaccine: decline Pneumococcal vaccine: decline Tdap vaccine: decline Shingles vaccine: discussed   Covid-19: 10/11/2019, 09/20/2019  Advanced directives: Advance directive discussed with you today. Even though you declined this today please call our office should you change your mind and we can give you the proper paperwork for you to fill out.  Conditions/risks identified: smoking  Next appointment: Follow up in one year for your annual wellness visit    Preventive Care 43 Years and Older, Female Preventive care refers to lifestyle choices and visits with your health care provider that can promote health and wellness. What does preventive care include? A yearly physical exam. This is also called an annual well check. Dental exams once or twice a year. Routine eye exams. Ask your health care provider how often you should have your eyes checked. Personal lifestyle choices, including: Daily care of your teeth and gums. Regular physical activity. Eating a healthy diet. Avoiding tobacco and drug use. Limiting alcohol use. Practicing safe sex. Taking low-dose aspirin every day. Taking vitamin and mineral supplements as recommended by your health care provider. What happens during an annual well check? The services and screenings done by your health care provider during your annual well check will depend on your age, overall health,  lifestyle risk factors, and family history of disease. Counseling  Your health care provider may ask you questions about your: Alcohol use. Tobacco use. Drug use. Emotional well-being. Home and relationship well-being. Sexual activity. Eating habits. History of falls. Memory and ability to understand (cognition). Work and work Statistician. Reproductive health. Screening  You may have the following tests or measurements: Height, weight, and BMI. Blood pressure. Lipid and cholesterol levels. These may be checked every 5 years, or more frequently if you are over 64 years old. Skin check. Lung cancer screening. You may have this screening every year starting at age 44 if you have a 30-pack-year history of smoking and currently smoke or have quit within the past 15 years. Fecal occult blood test (FOBT) of the stool. You may have this test every year starting at age 50. Flexible sigmoidoscopy or colonoscopy. You may have a sigmoidoscopy every 5 years or a colonoscopy every 10 years starting at age 71. Hepatitis C blood test. Hepatitis B blood test. Sexually transmitted disease (STD) testing. Diabetes screening. This is done by checking your blood sugar (glucose) after you have not eaten for a while (fasting). You may have this done every 1-3 years. Bone density scan. This is done to screen for osteoporosis. You may have this done starting at age 11. Mammogram. This may be done every 1-2 years. Talk to your health care provider about how often you should have regular mammograms. Talk with your health care provider about your test results, treatment options, and if necessary, the need for more tests. Vaccines  Your health care provider may recommend certain vaccines, such as: Influenza vaccine. This is recommended every year. Tetanus, diphtheria, and acellular pertussis (Tdap, Td) vaccine. You may need a Td booster every 10 years. Zoster vaccine. You may  need this after age  41. Pneumococcal 13-valent conjugate (PCV13) vaccine. One dose is recommended after age 56. Pneumococcal polysaccharide (PPSV23) vaccine. One dose is recommended after age 6. Talk to your health care provider about which screenings and vaccines you need and how often you need them. This information is not intended to replace advice given to you by your health care provider. Make sure you discuss any questions you have with your health care provider. Document Released: 08/27/2015 Document Revised: 04/19/2016 Document Reviewed: 06/01/2015 Elsevier Interactive Patient Education  2017 [place] Prevention in the Home Falls can cause injuries. They can happen to people of all ages. There are many things you can do to make your home safe and to help prevent falls. What can I do on the outside of my home? Regularly fix the edges of walkways and driveways and fix any cracks. Remove anything that might make you trip as you walk through a door, such as a raised step or threshold. Trim any bushes or trees on the path to your home. Use bright outdoor lighting. Clear any walking paths of anything that might make someone trip, such as rocks or tools. Regularly check to see if handrails are loose or broken. Make sure that both sides of any steps have handrails. Any raised decks and porches should have guardrails on the edges. Have any leaves, snow, or ice cleared regularly. Use sand or salt on walking paths during winter. Clean up any spills in your garage right away. This includes oil or grease spills. What can I do in the bathroom? Use night lights. Install grab bars by the toilet and in the tub and shower. Do not use towel bars as grab bars. Use non-skid mats or decals in the tub or shower. If you need to sit down in the shower, use a plastic, non-slip stool. Keep the floor dry. Clean up any water that spills on the floor as soon as it happens. Remove soap buildup in the tub or shower  regularly. Attach bath mats securely with double-sided non-slip rug tape. Do not have throw rugs and other things on the floor that can make you trip. What can I do in the bedroom? Use night lights. Make sure that you have a light by your bed that is easy to reach. Do not use any sheets or blankets that are too big for your bed. They should not hang down onto the floor. Have a firm chair that has side arms. You can use this for support while you get dressed. Do not have throw rugs and other things on the floor that can make you trip. What can I do in the kitchen? Clean up any spills right away. Avoid walking on wet floors. Keep items that you use a lot in easy-to-reach places. If you need to reach something above you, use a strong step stool that has a grab bar. Keep electrical cords out of the way. Do not use floor polish or wax that makes floors slippery. If you must use wax, use non-skid floor wax. Do not have throw rugs and other things on the floor that can make you trip. What can I do with my stairs? Do not leave any items on the stairs. Make sure that there are handrails on both sides of the stairs and use them. Fix handrails that are broken or loose. Make sure that handrails are as long as the stairways. Check any carpeting to make sure that it is firmly attached  to the stairs. Fix any carpet that is loose or worn. Avoid having throw rugs at the top or bottom of the stairs. If you do have throw rugs, attach them to the floor with carpet tape. Make sure that you have a light switch at the top of the stairs and the bottom of the stairs. If you do not have them, ask someone to add them for you. What else can I do to help prevent falls? Wear shoes that: Do not have high heels. Have rubber bottoms. Are comfortable and fit you well. Are closed at the toe. Do not wear sandals. If you use a stepladder: Make sure that it is fully opened. Do not climb a closed stepladder. Make sure that  both sides of the stepladder are locked into place. Ask someone to hold it for you, if possible. Clearly mark and make sure that you can see: Any grab bars or handrails. First and last steps. Where the edge of each step is. Use tools that help you move around (mobility aids) if they are needed. These include: Canes. Walkers. Scooters. Crutches. Turn on the lights when you go into a dark area. Replace any light bulbs as soon as they burn out. Set up your furniture so you have a clear path. Avoid moving your furniture around. If any of your floors are uneven, fix them. If there are any pets around you, be aware of where they are. Review your medicines with your doctor. Some medicines can make you feel dizzy. This can increase your chance of falling. Ask your doctor what other things that you can do to help prevent falls. This information is not intended to replace advice given to you by your health care provider. Make sure you discuss any questions you have with your health care provider. Document Released: 05/27/2009 Document Revised: 01/06/2016 Document Reviewed: 09/04/2014 Elsevier Interactive Patient Education  2017 [employer].

## 2022-06-28 NOTE — Progress Notes (Signed)
Subjective:   Julie Obrien is a 71 y.o. female who presents for Medicare Annual (Subsequent) preventive examination.  Review of Systems     Cardiac Risk Factors include: advanced age (>81mn, >>42women);obesity (BMI >30kg/m2);smoking/ tobacco exposure     Objective:    Today's Vitals   06/28/22 1057  BP: 110/60  Pulse: 83  Temp: 98.1 F (36.7 C)  TempSrc: Oral  SpO2: 93%  Weight: 190 lb 3.2 oz (86.3 kg)  Height: '5\' 2"'$  (1.575 m)   Body mass index is 34.79 kg/m.     06/28/2022   11:08 AM 06/22/2021    9:51 AM 03/04/2021   10:16 AM 02/17/2021   10:14 AM 06/16/2020    4:12 PM 09/11/2019    1:16 PM 04/23/2019   10:21 AM  Advanced Directives  Does Patient Have a Medical Advance Directive? No No No No No No No  Would patient like information on creating a medical advance directive? No - Patient declined  No - Patient declined No - Patient declined  No - Patient declined     Current Medications (verified) Outpatient Encounter Medications as of 06/28/2022  Medication Sig   acetaminophen (TYLENOL) 650 MG CR tablet Take 650 mg by mouth daily. Takes three tablets a few times a week. Does not take it every day.   atorvastatin (LIPITOR) 20 MG tablet Take 1 tablet (20 mg total) by mouth daily.   Vitamin D, Ergocalciferol, (DRISDOL) 1.25 MG (50000 UNIT) CAPS capsule TAKE 1 CAPSULE (50,000 UNITS TOTAL) BY MOUTH 2 (TWO) TIMES A WEEK. TAKE ON TUES & FRI   albuterol (VENTOLIN HFA) 108 (90 Base) MCG/ACT inhaler Inhale 1-2 puffs into the lungs every 4 (four) hours as needed for wheezing or shortness of breath.   cyclobenzaprine (FLEXERIL) 10 MG tablet Take 10 mg by mouth 3 (three) times daily as needed.   fluconazole (DIFLUCAN) 150 MG tablet Take 1 tablet (150 mg total) by mouth daily. (Patient not taking: Reported on 04/05/2022)   methocarbamol (ROBAXIN) 750 MG tablet Take 1 tablet (750 mg total) by mouth every 6 (six) hours as needed for muscle spasms.   metroNIDAZOLE (FLAGYL) 500 MG  tablet Take 500 mg by mouth 2 (two) times daily.   No facility-administered encounter medications on file as of 06/28/2022.    Allergies (verified) Nisoldipine, Augmentin [amoxicillin-pot clavulanate], and Sulfa antibiotics   History: Past Medical History:  Diagnosis Date   Arthritis    hnp, low back , knees - " all OA"   Complication of anesthesia    nausea one surgery more recent   History of kidney stones    Pneumonia    Feb.- Mar. 2017, not hospitalized, but pending surgery was cancelled ,since then, pt. seen by PCP, told that it was cleared     PONV (postoperative nausea and vomiting)    Past Surgical History:  Procedure Laterality Date   BACK SURGERY  08   CERVICAL DISCECTOMY  09   CESAREAN SECTION     x4   CYST EXCISION     vocal cords?   CYSTO  99   stone   HARDWARE REMOVAL N/A 06/21/2015   Procedure: Exploration of Fusion and removal of hardware right thoracic level one;  Surgeon: GKary Kos MD;  [facility];  Service: Neurosurgery;  Laterality: N/A;   LAMINECTOMY WITH POSTERIOR LATERAL ARTHRODESIS LEVEL 2 N/A 03/04/2021   Procedure: posterior lateral fusion - Thoracic eleven-Thoracic twelve - Thoracic twelve-Lumbar one, Redo Lumbar One-Two Posterior  Lumbar Interbody Fusion;  Surgeon: Kary Kos, MD;  Location: [facility];  Service: Neurosurgery;  Laterality: N/A;   POSTERIOR CERVICAL FUSION/FORAMINOTOMY N/A 05/31/2015   Procedure: Decompression and Laminectomy Cervical three-four  Cervical six-seven  Cervical seven-thoracic-one Posterior cervical fusion with lateral mass fixation;  Surgeon: Kary Kos, MD;  Location: [facility];  Service: Neurosurgery;  Laterality: N/A;   TONSILLECTOMY     TUBAL LIGATION     Family History  Problem Relation Age of Onset   Dementia Mother    Cancer Mother    Cancer Sister    Cancer Maternal Aunt    Social History   Socioeconomic History   Marital status: Married    Spouse name: Anissia Wessells Sr.   Number of children:  4   Years of education: Not on file   Highest education level: Not on file  Occupational History   Occupation: retired  Tobacco Use   Smoking status: Every Day    Packs/day: 0.25    Years: 32.00    Total pack years: 8.00    Types: Cigarettes   Smokeless tobacco: Never   Tobacco comments:    "I know I have to tell myself this is what I want to do but I am not ready to do that yet"  Vaping Use   Vaping Use: Never used  Substance and Sexual Activity   Alcohol use: No   Drug use: No   Sexual activity: Not Currently  Other Topics Concern   Not on file  Social History Narrative   Not on file   Social Determinants of Health   Financial Resource Strain: Low Risk  (06/28/2022)   Overall Financial Resource Strain (CARDIA)    Difficulty of Paying Living Expenses: Not hard at all  Food Insecurity: No Food Insecurity (06/28/2022)   Hunger Vital Sign    Worried About Running Out of Food in the Last Year: Never true    Ran Out of Food in the Last Year: Never true  Transportation Needs: No Transportation Needs (06/28/2022)   PRAPARE - Hydrologist (Medical): No    Lack of Transportation (Non-Medical): No  Physical Activity: Inactive (06/28/2022)   Exercise Vital Sign    Days of Exercise per Week: 0 days    Minutes of Exercise per Session: 0 min  Stress: No Stress Concern Present (06/28/2022)   [place]    Feeling of Stress : Not at all  Social Connections: [place] (12/03/2018)   Social Connection and Isolation Panel [NHANES]    Frequency of Communication with Friends and Family: More than three times a week    Frequency of Social Gatherings with Friends and Family: More than three times a week    Attends Religious Services: More than 4 times per year    Active Member of [employer] or Organizations: Yes    Attends Music therapist: More than 4 times per year    Marital  Status: Married    Tobacco Counseling Ready to quit: No Counseling given: Not Answered Tobacco comments: "I know I have to tell myself this is what I want to do but I am not ready to do that yet"   Clinical Intake:  Pre-visit preparation completed: Yes  Pain : No/denies pain     Nutritional Status: BMI > 30  Obese Nutritional Risks: None Diabetes: No  How often do you need to have someone help you when you  read instructions, pamphlets, or other written materials from your doctor or pharmacy?: 1 - Never  Diabetic? no  Interpreter Needed?: No  Information entered by :: NAllen LPN   Activities of Daily Living    06/28/2022   11:11 AM  In your present state of health, do you have any difficulty performing the following activities:  Hearing? 0  Vision? 0  Difficulty concentrating or making decisions? 0  Walking or climbing stairs? 0  Dressing or bathing? 0  Doing errands, shopping? 0  Preparing Food and eating ? N  Using the Toilet? N  In the past six months, have you accidently leaked urine? N  Do you have problems with loss of bowel control? N  Managing your Medications? N  Managing your Finances? N  Housekeeping or managing your Housekeeping? N    Patient Care Team: Glendale Chard, MD as PCP - General  Indicate any recent Medical Services you may have received from other than [facility] providers in the past year (date may be approximate).     Assessment:   This is a routine wellness examination for [place].  Hearing/Vision screen Vision Screening - Comments:: Regular eye exams, [facility]  Dietary issues and exercise activities discussed: Current Exercise Habits: The patient does not participate in regular exercise at present   Goals Addressed             This Visit's Progress    Patient Stated       06/28/2022, wants to lose weight       Depression Screen    06/28/2022   11:11 AM 06/22/2021    9:52 AM 06/16/2020    4:13 PM 06/26/2019     2:20 PM 04/23/2019   10:22 AM 12/03/2018    1:15 PM 09/11/2018   10:24 AM  PHQ 2/9 Scores  PHQ - 2 Score 0 0 0 0 0 0 0    Fall Risk    06/28/2022   11:10 AM 06/22/2021    9:52 AM 06/16/2020    4:13 PM 06/26/2019    2:20 PM 04/23/2019   10:22 AM  Fall Risk   Falls in the past year? 1 0 0 0 0  Comment trying to stop a frog      Number falls in past yr: 0      Injury with Fall? 0      Risk for fall due to : Medication side effect Medication side effect No Fall Risks    Follow up Falls prevention discussed;Education provided;Falls evaluation completed Falls evaluation completed;Education provided;Falls prevention discussed Falls evaluation completed;Education provided;Falls prevention discussed  Falls evaluation completed;Education provided;Falls prevention discussed    FALL RISK PREVENTION PERTAINING TO THE HOME:  Any stairs in or around the home? Yes  If so, are there any without handrails? No  Home free of loose throw rugs in walkways, pet beds, electrical cords, etc? Yes  Adequate lighting in your home to reduce risk of falls? Yes   ASSISTIVE DEVICES UTILIZED TO PREVENT FALLS:  Life alert? No  Use of a cane, walker or w/c? No  Grab bars in the bathroom? No  Shower chair or bench in shower? No  Elevated toilet seat or a handicapped toilet? No   TIMED UP AND GO:  Was the test performed? Yes .  Length of time to ambulate 10 feet: 5 sec.   Gait steady and fast without use of assistive device  Cognitive Function:        06/28/2022  11:12 AM 06/22/2021    9:54 AM 06/16/2020    4:14 PM 04/23/2019   10:23 AM  6CIT Screen  What Year? 0 points 0 points 0 points 0 points  What month? 0 points 0 points 0 points 0 points  What time? 0 points 0 points 0 points 0 points  Count back from 20 0 points 0 points 0 points 0 points  Months in reverse 0 points 0 points 0 points 0 points  Repeat phrase 2 points 0 points 0 points 0 points  Total Score 2 points 0 points 0 points 0 points     Immunizations Immunization History  Administered Date(s) Administered   PFIZER(Purple Top)SARS-COV-2 Vaccination 09/20/2019, 10/11/2019   Pneumococcal Polysaccharide-23 05/09/2010   Tdap 09/18/2010    TDAP status: Due, Education has been provided regarding the importance of this vaccine. Advised may receive this vaccine at local pharmacy or Health Dept. Aware to provide a copy of the vaccination record if obtained from local pharmacy or Health Dept. Verbalized acceptance and understanding.  Flu Vaccine status: Declined, Education has been provided regarding the importance of this vaccine but patient still declined. Advised may receive this vaccine at local pharmacy or Health Dept. Aware to provide a copy of the vaccination record if obtained from local pharmacy or Health Dept. Verbalized acceptance and understanding.  Pneumococcal vaccine status: Declined,  Education has been provided regarding the importance of this vaccine but patient still declined. Advised may receive this vaccine at local pharmacy or Health Dept. Aware to provide a copy of the vaccination record if obtained from local pharmacy or Health Dept. Verbalized acceptance and understanding.   Covid-19 vaccine status: Completed vaccines  Qualifies for Shingles Vaccine? Yes   Zostavax completed No   Shingrix Completed?: No.    Education has been provided regarding the importance of this vaccine. Patient has been advised to call insurance company to determine out of pocket expense if they have not yet received this vaccine. Advised may also receive vaccine at local pharmacy or Health Dept. Verbalized acceptance and understanding.  Screening Tests Health Maintenance  Topic Date Due   Medicare Annual Wellness (AWV)  06/22/2022   Zoster Vaccines- Shingrix (1 of 2) 07/06/2022 (Originally 06/25/1970)   COVID-19 Vaccine (3 - Pfizer risk series) 07/14/2022 (Originally 11/08/2019)   INFLUENZA VACCINE  11/12/2022 (Originally  03/14/2022)   Pneumonia Vaccine 48+ Years old (2 - PCV) 06/29/2023 (Originally 05/10/2011)   TETANUS/TDAP  06/29/2023 (Originally 09/18/2020)   MAMMOGRAM  07/30/2022   COLONOSCOPY (Pts 45-70yr Insurance coverage will need to be confirmed)  04/12/2032   DEXA SCAN  Completed   Hepatitis C Screening  Completed   HPV VACCINES  Aged Out    Health Maintenance  Health Maintenance Due  Topic Date Due   Medicare Annual Wellness (AWV)  06/22/2022    Colorectal cancer screening: Type of screening: Colonoscopy. Completed 04/12/2022. Repeat every 7 years  Mammogram status: Completed 07/30/2021. Repeat every year  Bone Density status: Completed 04/07/2022.   Lung Cancer Screening: (Low Dose CT Chest recommended if Age 72-80years, 30 pack-year currently smoking OR have quit w/in 15years.) does not qualify.   Lung Cancer Screening Referral: no  Additional Screening:  Hepatitis C Screening: does qualify; Completed 01/09/2013  Vision Screening: Recommended annual ophthalmology exams for early detection of glaucoma and other disorders of the eye. Is the patient up to date with their annual eye exam?  Yes  Who is the provider or what is the name of the office  in which the patient attends annual eye exams? [facility] If pt is not established with a provider, would they like to be referred to a provider to establish care? No .   Dental Screening: Recommended annual dental exams for proper oral hygiene  Community Resource Referral / Chronic Care Management: CRR required this visit?  No   CCM required this visit?  No      Plan:     I have personally reviewed and noted the following in the patient's chart:   Medical and social history Use of alcohol, tobacco or illicit drugs  Current medications and supplements including opioid prescriptions. Patient is not currently taking opioid prescriptions. Functional ability and status Nutritional status Physical activity Advanced directives List  of other physicians Hospitalizations, surgeries, and ER visits in previous 12 months Vitals Screenings to include cognitive, depression, and falls Referrals and appointments  In addition, I have reviewed and discussed with patient certain preventive protocols, quality metrics, and best practice recommendations. A written personalized care plan for preventive services as well as general preventive health recommendations were provided to patient.     Kellie Simmering, LPN   [id number]/83/[id number]   Nurse Notes: none

## 2022-07-03 ENCOUNTER — Other Ambulatory Visit: Payer: Self-pay

## 2022-07-03 ENCOUNTER — Encounter: Payer: Self-pay | Admitting: Internal Medicine

## 2022-07-03 MED ORDER — VITAMIN D (ERGOCALCIFEROL) 1.25 MG (50000 UNIT) PO CAPS: 50000.0000 [IU] | ORAL_CAPSULE | ORAL | 1 refills | Status: AC

## 2022-07-03 MED ORDER — VITAMIN D (ERGOCALCIFEROL) 1.25 MG (50000 UNIT) PO CAPS: 50000.0000 [IU] | ORAL_CAPSULE | ORAL | 1 refills | Status: DC

## 2022-07-18 ENCOUNTER — Ambulatory Visit: Payer: Medicare Other | Admitting: Internal Medicine

## 2022-07-26 ENCOUNTER — Ambulatory Visit (INDEPENDENT_AMBULATORY_CARE_PROVIDER_SITE_OTHER): Payer: Medicare Other | Admitting: Internal Medicine

## 2022-07-26 ENCOUNTER — Encounter: Payer: Self-pay | Admitting: Internal Medicine

## 2022-07-26 ENCOUNTER — Other Ambulatory Visit: Payer: Self-pay

## 2022-07-26 VITALS — BP 122/78 | HR 80 | Ht 62.0 in | Wt 193.6 lb

## 2022-07-26 DIAGNOSIS — R635 Abnormal weight gain: Secondary | ICD-10-CM

## 2022-07-26 DIAGNOSIS — K219 Gastro-esophageal reflux disease without esophagitis: Secondary | ICD-10-CM | POA: Diagnosis not present

## 2022-07-26 DIAGNOSIS — Z23 Encounter for immunization: Secondary | ICD-10-CM

## 2022-07-26 DIAGNOSIS — F5101 Primary insomnia: Secondary | ICD-10-CM

## 2022-07-26 DIAGNOSIS — E7800 Pure hypercholesterolemia, unspecified: Secondary | ICD-10-CM | POA: Diagnosis not present

## 2022-07-26 DIAGNOSIS — E2839 Other primary ovarian failure: Secondary | ICD-10-CM | POA: Diagnosis not present

## 2022-07-26 DIAGNOSIS — R7309 Other abnormal glucose: Secondary | ICD-10-CM | POA: Diagnosis not present

## 2022-07-26 DIAGNOSIS — G8929 Other chronic pain: Secondary | ICD-10-CM

## 2022-07-26 DIAGNOSIS — Z6835 Body mass index (BMI) 35.0-35.9, adult: Secondary | ICD-10-CM

## 2022-07-26 MED ORDER — TETANUS-DIPHTH-ACELL PERTUSSIS 5-2.5-18.5 LF-MCG/0.5 IM SUSP: 0.5000 mL | Freq: Once | INTRAMUSCULAR | 0 refills | Status: AC

## 2022-07-26 MED ORDER — TETANUS-DIPHTH-ACELL PERTUSSIS 5-2.5-18.5 LF-MCG/0.5 IM SUSP: 0.5000 mL | Freq: Once | INTRAMUSCULAR | 0 refills | Status: DC

## 2022-07-26 NOTE — Patient Instructions (Signed)

## 2022-07-26 NOTE — Progress Notes (Signed)
I,Julie Obrien,acting as a scribe for Julie Greenland, MD.,have documented all relevant documentation on the behalf of Julie Greenland, MD,as directed by  Julie Greenland, MD while in the presence of Julie Greenland, MD.    Subjective:     Patient ID: Julie Obrien , female    DOB: [birth year] , 71 y.o.   MRN: [id number]   Chief Complaint  Patient presents with   Hyperlipidemia    HPI  Pt presents today for 6-8 week chol f/u & vit D recheck. She currently takes atorvastatin 2-20MG daily. She has no specific concerns or complaints at this time.     Hyperlipidemia This is a chronic problem. The problem is uncontrolled. Exacerbating diseases include obesity. Pertinent negatives include no myalgias or shortness of breath. Current antihyperlipidemic treatment includes statins. Compliance problems include adherence to exercise.  Risk factors for coronary artery disease include a sedentary lifestyle and post-menopausal.                                                                                                          Past Medical History:  Diagnosis Date   Arthritis    hnp, low back , knees - " all OA"   Complication of anesthesia    nausea one surgery more recent   History of kidney stones    Pneumonia    Feb.- Mar. 2017, not hospitalized, but pending surgery was cancelled ,since then, pt. seen by PCP, told that it was cleared     PONV (postoperative nausea and vomiting)      Family History  Problem Relation Age of Onset   Dementia Mother    Cancer Mother    Cancer Sister    Cancer Maternal Aunt      Current Outpatient Medications:    acetaminophen (TYLENOL) 650 MG CR tablet, Take 650 mg by mouth daily. Takes three tablets a few times a week. Does not take it every day., Disp: , Rfl:    atorvastatin (LIPITOR) 20 MG tablet, Take 1 tablet (20 mg total) by mouth  daily., Disp: 90 tablet, Rfl: 1   Vitamin D, Ergocalciferol, (DRISDOL) 1.25 MG (50000 UNIT) CAPS capsule, Take 1 capsule (50,000 Units total) by mouth 2 (two) times a week. Take on Tues & Fri, Disp: 8 capsule, Rfl: 1   Allergies  Allergen Reactions   Nisoldipine Hives   Augmentin [Amoxicillin-Pot Clavulanate] Nausea And Vomiting    "Tore up my stomach" (verified this with her daughter, a physician).     Sulfa Antibiotics Hives     Review of Systems  Constitutional: Negative.   Respiratory: Negative.  Negative for shortness of breath.   Cardiovascular: Negative.   Musculoskeletal:  Negative for myalgias.  Neurological: Negative.   Psychiatric/Behavioral:  Positive for sleep disturbance.      Today's Vitals   07/26/22 1119  BP: 122/78  Pulse: 80  SpO2: 96%  Weight: 193 lb 9.6 oz (87.8 kg)  Height: _0  (1.575 m)   Body mass index is 35.41 kg/m.  Wt Readings  from Last 3 Encounters:  07/26/22 193 lb 9.6 oz (87.8 kg)  06/28/22 190 lb 3.2 oz (86.3 kg)  04/05/22 184 lb 3.2 oz (83.6 kg)    Objective:  Physical Exam Vitals and nursing note reviewed.  Constitutional:      Appearance: Normal appearance. She is obese.  HENT:     Head: Normocephalic and atraumatic.     Nose:     Comments: Masked     Mouth/Throat:     Comments: Masked  Eyes:     Extraocular Movements: Extraocular movements intact.  Cardiovascular:     Rate and Rhythm: Normal rate and regular rhythm.     Heart sounds: Normal heart sounds.  Pulmonary:     Effort: Pulmonary effort is normal.     Breath sounds: Normal breath sounds.  Musculoskeletal:     Cervical back: Normal range of motion.  Skin:    General: Skin is warm.  Neurological:     General: No focal deficit present.     Mental Status: She is alert.  Psychiatric:        Mood and Affect: Mood normal.        Behavior: Behavior normal.         Assessment And Plan:     1. Pure hypercholesterolemia Comments: Chronic, she is now taking  atorvastatin 13m daily. I will check lipid panel and liver function today.  I will adjust meds accordingly. - Lipid panel - CMP14+EGFR - Amb Referral To Provider Referral Exercise Program (P.R.E.P)  2. Primary insomnia Comments: Chronic, reminded to develop a bedtime routine.  She was given samples of Belsomra to take nightly prn. She agrees to let me know how this works for her.  3. Estrogen deficiency Comments: She agrees to bone density. Originally [place] in July 2023, [place] [place] [facility]  4. Other abnormal glucose Comments: Her a1c has been elevated in the past. I will recheck this today. She is encouraged to limit her intake of sugary beverages and foods, including diet drinks. - Hemoglobin A1c  5. Weight gain Comments: She has gained 9lbs since August 2023. She is encouraged to incorporate more exercise into her daily routine. Agrees to PREP referral. - Amb Referral To Provider Referral Exercise Program (P.R.E.P) - Insulin, random([phone])  6. Gastroesophageal reflux disease without esophagitis Comments: Chronic, reminded to stop eating 3 hours prior to lying down.  7. Class 2 severe obesity due to excess calories with serious comorbidity and body mass index (BMI) of 35.0 to 35.9 in adult ([facility] Comments: She is encouraged to aim for at least 150 minutes of exercise/week, while initially striving for BMI<30 to decrease cardiac risk. - Amb Referral To Provider Referral Exercise Program (P.R.E.P)  8. Immunization due Comments: I will send Tdap to her local pharmacy. This is unable to be billed via [employer].  Patient was given opportunity to ask questions. Patient verbalized understanding of the plan and was able to repeat key elements of the plan. All questions were answered to their satisfaction.   I, RMaximino Greenland MD, have reviewed all documentation for this visit. The documentation on 07/26/22 for the exam, diagnosis, procedures, and orders are all accurate and  complete.   IF YOU HAVE BEEN REFERRED TO A SPECIALIST, IT MAY TAKE 1-2 WEEKS TO SCHEDULE/PROCESS THE REFERRAL. IF YOU HAVE NOT HEARD FROM US/SPECIALIST IN TWO WEEKS, PLEASE GIVE [place] CALL AT [phone] X [phone].   THE PATIENT IS ENCOURAGED TO PRACTICE SOCIAL DISTANCING DUE TO  THE COVID-19 PANDEMIC.

## 2022-07-27 LAB — LIPID PANEL
Chol/HDL Ratio: 2.4 ratio (ref 0.0–4.4)
Cholesterol, Total: 168 mg/dL (ref 100–199)
HDL: 70 mg/dL (ref >39)
LDL Chol Calc (NIH): 79 mg/dL (ref 0–99)
Triglycerides: 107 mg/dL (ref 0–149)
VLDL Cholesterol Cal: 19 mg/dL (ref 5–40)

## 2022-07-27 LAB — CMP14+EGFR
ALT: 59 IU/L — ABNORMAL HIGH (ref 0–32)
AST: 72 IU/L — ABNORMAL HIGH (ref 0–40)
Albumin/Globulin Ratio: 2.2 (ref 1.2–2.2)
Albumin: 4.8 g/dL (ref 3.8–4.8)
Alkaline Phosphatase: 261 IU/L — ABNORMAL HIGH (ref 44–121)
BUN/Creatinine Ratio: 14 (ref 12–28)
BUN: 11 mg/dL (ref 8–27)
Bilirubin Total: 0.3 mg/dL (ref 0.0–1.2)
CO2: 22 mmol/L (ref 20–29)
Calcium: 10.1 mg/dL (ref 8.7–10.3)
Chloride: 107 mmol/L — ABNORMAL HIGH (ref 96–106)
Creatinine, Ser: 0.8 mg/dL (ref 0.57–1.00)
Globulin, Total: 2.2 g/dL (ref 1.5–4.5)
Glucose: 85 mg/dL (ref 70–99)
Potassium: 4.7 mmol/L (ref 3.5–5.2)
Sodium: 146 mmol/L — ABNORMAL HIGH (ref 134–144)
Total Protein: 7 g/dL (ref 6.0–8.5)
eGFR: 78 mL/min/{1.73_m2} (ref >59)

## 2022-07-27 LAB — HEMOGLOBIN A1C
Est. average glucose Bld gHb Est-mCnc: 123 mg/dL
Hgb A1c MFr Bld: 5.9 % — ABNORMAL HIGH (ref 4.8–5.6)

## 2022-07-27 LAB — INSULIN, RANDOM: INSULIN: 9.9 u[IU]/mL (ref 2.6–24.9)

## 2022-07-28 ENCOUNTER — Telehealth: Payer: Self-pay

## 2022-07-28 NOTE — Telephone Encounter (Signed)
She returned my call, states she is out of town until end of Feb, will contact me once she is back in town to discuss 2024 schedule.

## 2022-07-28 NOTE — Telephone Encounter (Signed)
Called to discuss PREP program referral; left voicemail. 

## 2022-08-02 ENCOUNTER — Ambulatory Visit: Payer: Medicare Other | Admitting: Internal Medicine

## 2022-08-09 DIAGNOSIS — K219 Gastro-esophageal reflux disease without esophagitis: Secondary | ICD-10-CM | POA: Insufficient documentation

## 2022-08-09 DIAGNOSIS — R7309 Other abnormal glucose: Secondary | ICD-10-CM | POA: Insufficient documentation

## 2022-08-09 DIAGNOSIS — F5101 Primary insomnia: Secondary | ICD-10-CM | POA: Insufficient documentation

## 2022-08-09 DIAGNOSIS — R635 Abnormal weight gain: Secondary | ICD-10-CM | POA: Insufficient documentation

## 2022-11-10 ENCOUNTER — Telehealth: Payer: Self-pay

## 2022-11-10 NOTE — Telephone Encounter (Signed)
Called to discuss PREP program, she states she is still in [facility], will call me when she returns to [place] to enroll in class

## 2023-01-12 DIAGNOSIS — M25561 Pain in right knee: Secondary | ICD-10-CM | POA: Diagnosis not present

## 2023-01-12 DIAGNOSIS — M1990 Unspecified osteoarthritis, unspecified site: Secondary | ICD-10-CM | POA: Diagnosis not present

## 2023-01-12 DIAGNOSIS — M810 Age-related osteoporosis without current pathological fracture: Secondary | ICD-10-CM | POA: Diagnosis not present

## 2023-01-25 ENCOUNTER — Ambulatory Visit: Payer: Medicare Other | Admitting: Internal Medicine

## 2023-02-12 ENCOUNTER — Other Ambulatory Visit: Payer: Self-pay | Admitting: Pharmacist

## 2023-02-12 NOTE — Progress Notes (Unsigned)
Pharmacy Quality Measure Review  This patient is appearing on a report for being at risk of failing the adherence measure for cholesterol (statin) medications this calendar year.   Medication: atorvastatin 40 mg daily  Last fill date: 11/04/25 for 30 day supply at [facility] in [place], [place]  Contacted patient. Left voicemail for her to return my call at her convenience.   Catie Eppie Gibson, PharmD, BCACP, CPP Clinical Pharmacist [facility] [phone]

## 2023-03-15 ENCOUNTER — Encounter: Payer: Self-pay | Admitting: Internal Medicine

## 2023-03-15 ENCOUNTER — Ambulatory Visit (INDEPENDENT_AMBULATORY_CARE_PROVIDER_SITE_OTHER): Payer: Medicare HMO | Admitting: Internal Medicine

## 2023-03-15 VITALS — BP 108/74 | HR 73 | Temp 97.7°F | Ht 62.0 in | Wt 193.6 lb

## 2023-03-15 DIAGNOSIS — Z23 Encounter for immunization: Secondary | ICD-10-CM

## 2023-03-15 DIAGNOSIS — F5101 Primary insomnia: Secondary | ICD-10-CM

## 2023-03-15 DIAGNOSIS — R7309 Other abnormal glucose: Secondary | ICD-10-CM

## 2023-03-15 DIAGNOSIS — I2583 Coronary atherosclerosis due to lipid rich plaque: Secondary | ICD-10-CM | POA: Diagnosis not present

## 2023-03-15 DIAGNOSIS — Z1231 Encounter for screening mammogram for malignant neoplasm of breast: Secondary | ICD-10-CM | POA: Diagnosis not present

## 2023-03-15 DIAGNOSIS — Z6835 Body mass index (BMI) 35.0-35.9, adult: Secondary | ICD-10-CM

## 2023-03-15 DIAGNOSIS — E2839 Other primary ovarian failure: Secondary | ICD-10-CM | POA: Diagnosis not present

## 2023-03-15 DIAGNOSIS — E7800 Pure hypercholesterolemia, unspecified: Secondary | ICD-10-CM

## 2023-03-15 DIAGNOSIS — I2510 Atherosclerotic heart disease of native coronary artery without angina pectoris: Secondary | ICD-10-CM

## 2023-03-15 LAB — HM MAMMOGRAPHY: HM Mammogram: NORMAL (ref 0–4)

## 2023-03-15 MED ORDER — ATORVASTATIN CALCIUM 20 MG PO TABS: 20.0000 mg | ORAL_TABLET | Freq: Every day | ORAL | 1 refills | Status: DC

## 2023-03-15 NOTE — Patient Instructions (Signed)
Cholesterol Content in Foods Cholesterol is a waxy, fat-like substance that helps to carry fat in the blood. The body needs cholesterol in small amounts, but too much cholesterol can cause damage to the arteries and heart. What foods have cholesterol?  Cholesterol is found in animal-based foods, such as meat, seafood, and dairy. Generally, low-fat dairy and lean meats have less cholesterol than full-fat dairy and fatty meats. The milligrams of cholesterol per serving (mg per serving) of common cholesterol-containing foods are listed below. Meats and other proteins Egg -- one large whole egg has 186 mg. Veal shank -- 4 oz (113 g) has 141 mg. Lean ground turkey (93% lean) -- 4 oz (113 g) has 118 mg. Fat-trimmed lamb loin -- 4 oz (113 g) has 106 mg. Lean ground beef (90% lean) -- 4 oz (113 g) has 100 mg. Lobster -- 3.5 oz (99 g) has 90 mg. Pork loin chops -- 4 oz (113 g) has 86 mg. Canned salmon -- 3.5 oz (99 g) has 83 mg. Fat-trimmed beef top loin -- 4 oz (113 g) has 78 mg. Frankfurter -- 1 frank (3.5 oz or 99 g) has 77 mg. Crab -- 3.5 oz (99 g) has 71 mg. Roasted chicken without skin, white meat -- 4 oz (113 g) has 66 mg. Light bologna -- 2 oz (57 g) has 45 mg. Deli-cut turkey -- 2 oz (57 g) has 31 mg. Canned tuna -- 3.5 oz (99 g) has 31 mg. Bacon -- 1 oz (28 g) has 29 mg. Oysters and mussels (raw) -- 3.5 oz (99 g) has 25 mg. Mackerel -- 1 oz (28 g) has 22 mg. Trout -- 1 oz (28 g) has 20 mg. Pork sausage -- 1 link (1 oz or 28 g) has 17 mg. Salmon -- 1 oz (28 g) has 16 mg. Tilapia -- 1 oz (28 g) has 14 mg. Dairy Soft-serve ice cream --  cup (4 oz or 86 g) has 103 mg. Whole-milk yogurt -- 1 cup (8 oz or 245 g) has 29 mg. Cheddar cheese -- 1 oz (28 g) has 28 mg. American cheese -- 1 oz (28 g) has 28 mg. Whole milk -- 1 cup (8 oz or 250 mL) has 23 mg. 2% milk -- 1 cup (8 oz or 250 mL) has 18 mg. Cream cheese -- 1 tablespoon (Tbsp) (14.5 g) has 15 mg. Cottage cheese --  cup (4 oz or  113 g) has 14 mg. Low-fat (1%) milk -- 1 cup (8 oz or 250 mL) has 10 mg. Sour cream -- 1 Tbsp (12 g) has 8.5 mg. Low-fat yogurt -- 1 cup (8 oz or 245 g) has 8 mg. Nonfat Greek yogurt -- 1 cup (8 oz or 228 g) has 7 mg. Half-and-half cream -- 1 Tbsp (15 mL) has 5 mg. Fats and oils Cod liver oil -- 1 tablespoon (Tbsp) (13.6 g) has 82 mg. Butter -- 1 Tbsp (14 g) has 15 mg. Lard -- 1 Tbsp (12.8 g) has 14 mg. Bacon grease -- 1 Tbsp (12.9 g) has 14 mg. Mayonnaise -- 1 Tbsp (13.8 g) has 5-10 mg. Margarine -- 1 Tbsp (14 g) has 3-10 mg. The items listed above may not be a complete list of foods with cholesterol. Exact amounts of cholesterol in these foods may vary depending on specific ingredients and brands. Contact a dietitian for more information. What foods do not have cholesterol? Most plant-based foods do not have cholesterol unless you combine them with a food that has   cholesterol. Foods without cholesterol include: Grains and cereals. Vegetables. Fruits. Vegetable oils, such as olive, canola, and sunflower oil. Legumes, such as peas, beans, and lentils. Nuts and seeds. Egg whites. The items listed above may not be a complete list of foods that do not have cholesterol. Contact a dietitian for more information. Summary The body needs cholesterol in small amounts, but too much cholesterol can cause damage to the arteries and heart. Cholesterol is found in animal-based foods, such as meat, seafood, and dairy. Generally, low-fat dairy and lean meats have less cholesterol than full-fat dairy and fatty meats. This information is not intended to replace advice given to you by your health care provider. Make sure you discuss any questions you have with your health care provider. Document Revised: 12/10/2020 Document Reviewed: 12/10/2020 Elsevier Patient Education  2024 Elsevier Inc.  

## 2023-03-15 NOTE — Progress Notes (Signed)
I,Victoria T Deloria Lair, CMA,acting as a Neurosurgeon for Gwynneth Aliment, MD.,have documented all relevant documentation on the behalf of Gwynneth Aliment, MD,as directed by  Gwynneth Aliment, MD while in the presence of Gwynneth Aliment, MD.  Subjective:  Patient ID: Julie Obrien , female    DOB: 08-Dec-1950 , 72 y.o.   MRN: [id number]  Chief Complaint  Patient presents with   Hyperlipidemia    HPI  Patient presents today for cholesterol follow up. She reports compliance with medications. Denies headache, chest pain, and SOB.   She is scheduled to complete mammogram today at 2pm.  She has no specific concerns or complaints at this time.     Hyperlipidemia This is a chronic problem. The problem is uncontrolled. Exacerbating diseases include obesity. Pertinent negatives include no myalgias or shortness of breath. Current antihyperlipidemic treatment includes statins. Compliance problems include adherence to exercise.  Risk factors for coronary artery disease include a sedentary lifestyle and post-menopausal.     Past Medical History:  Diagnosis Date   Arthritis    hnp, low back , knees - " all OA"   Complication of anesthesia    nausea one surgery more recent   History of kidney stones    Pneumonia    Feb.- Mar. 2017, not hospitalized, but pending surgery was cancelled ,since then, pt. seen by PCP, told that it was cleared     PONV (postoperative nausea and vomiting)      Family History  Problem Relation Age of Onset   Dementia Mother    Cancer Mother    Cancer Sister    Cancer Maternal Aunt      Current Outpatient Medications:    acetaminophen (TYLENOL) 650 MG CR tablet, Take 650 mg by mouth daily. Takes three tablets a few times a week. Does not take it every day., Disp: , Rfl:    atorvastatin (LIPITOR) 20 MG tablet, Take 1 tablet (20 mg total) by mouth daily., Disp: 90 tablet, Rfl: 1   Vitamin D, Ergocalciferol, (DRISDOL) 1.25 MG (50000 UNIT) CAPS capsule, Take 1 capsule  (50,000 Units total) by mouth 2 (two) times a week. Take on Tues & Fri (Patient not taking: Reported on 03/15/2023), Disp: 8 capsule, Rfl: 1   Allergies  Allergen Reactions   Nisoldipine Hives   Augmentin [Amoxicillin-Pot Clavulanate] Nausea And Vomiting    "Tore up my stomach" (verified this with her daughter, a physician).     Sulfa Antibiotics Hives     Review of Systems  Constitutional: Negative.   Respiratory: Negative.  Negative for shortness of breath.   Cardiovascular: Negative.   Gastrointestinal: Negative.   Musculoskeletal:  Negative for myalgias.  Neurological: Negative.   Psychiatric/Behavioral: Negative.       Today's Vitals   03/15/23 0845  BP: 108/74  Pulse: 73  Temp: 97.7 F (36.5 C)  SpO2: 98%  Weight: 193 lb 9.6 oz (87.8 kg)  Height: 5\' 2"  (1.575 m)   Body mass index is 35.41 kg/m.  Wt Readings from Last 3 Encounters:  03/15/23 193 lb 9.6 oz (87.8 kg)  07/26/22 193 lb 9.6 oz (87.8 kg)  06/28/22 190 lb 3.2 oz (86.3 kg)     Objective:  Physical Exam Vitals and nursing note reviewed.  Constitutional:      Appearance: Normal appearance.  HENT:     Head: Normocephalic and atraumatic.  Eyes:     Extraocular Movements: Extraocular movements intact.  Cardiovascular:     Rate and Rhythm:  Normal rate and regular rhythm.     Heart sounds: Normal heart sounds.  Pulmonary:     Effort: Pulmonary effort is normal.     Breath sounds: Normal breath sounds.  Musculoskeletal:     Cervical back: Normal range of motion.  Skin:    General: Skin is warm.  Neurological:     General: No focal deficit present.     Mental Status: She is alert.  Psychiatric:        Mood and Affect: Mood normal.        Behavior: Behavior normal.         Assessment And Plan:  Pure hypercholesterolemia Assessment & Plan: She has been taking atorvastatin 20mg  daily without any issues. I will check lipid/liver panel today.  She agrees to f/u in 4-6 months for re-evaluation.    Orders: -     CBC -     CMP14+EGFR -     Lipid panel -     TSH  Coronary artery disease due to lipid rich plaque Assessment & Plan: Chronic, now on ASA and statin therapy. LDL goal is less than 70.  Importance of medication/dietary compliance was discussed with the patient in detail.   Other abnormal glucose Assessment & Plan: Her a1c has been elevated in the past. I will recheck this today. She is encouraged to limit her intake of sugary beverages and foods, including diet drinks.    Orders: -     Hemoglobin A1c  Estrogen deficiency Assessment & Plan: Her last bone density was in June 2023, normal. She is encouraged to continue to engage in weight-bearing activities at least twice weekly, and to continue with calcium/vitamin D supplementation.   Orders: -     VITAMIN D 25 Hydroxy (Vit-D Deficiency, Fractures)  Class 2 severe obesity due to excess calories with serious comorbidity and body mass index (BMI) of 35.0 to 35.9 in adult [facility]) Assessment & Plan: She is encouraged to strive for BMI less than 30 to decrease cardiac risk. Advised to aim for at least 150 minutes of exercise per week.    Immunization due -     Tdap vaccine greater than or equal to 7yo IM  Other orders -     Atorvastatin Calcium; Take 1 tablet (20 mg total) by mouth daily.  Dispense: 90 tablet; Refill: 1     Return in about 6 months (around 09/15/2023), or physical exam, cancel RS Dec visit.  Patient was given opportunity to ask questions. Patient verbalized understanding of the plan and was able to repeat key elements of the plan. All questions were answered to their satisfaction.    I, Gwynneth Aliment, MD, have reviewed all documentation for this visit. The documentation on 03/15/23 for the exam, diagnosis, procedures, and orders are all accurate and complete.   IF YOU HAVE BEEN REFERRED TO A SPECIALIST, IT MAY TAKE 1-2 WEEKS TO SCHEDULE/PROCESS THE REFERRAL. IF YOU HAVE NOT HEARD FROM  US/SPECIALIST IN TWO WEEKS, PLEASE GIVE [place] A CALL AT [phone].   THE PATIENT IS ENCOURAGED TO PRACTICE SOCIAL DISTANCING DUE TO THE COVID-19 PANDEMIC.

## 2023-03-26 DIAGNOSIS — I2510 Atherosclerotic heart disease of native coronary artery without angina pectoris: Secondary | ICD-10-CM | POA: Insufficient documentation

## 2023-03-26 NOTE — Assessment & Plan Note (Signed)
Chronic, now on ASA and statin therapy. LDL goal is less than 70.  Importance of medication/dietary compliance was discussed with the patient in detail.

## 2023-03-26 NOTE — Assessment & Plan Note (Signed)
Her last bone density was in June 2023, normal. She is encouraged to continue to engage in weight-bearing activities at least twice weekly, and to continue with calcium/vitamin D supplementation.

## 2023-03-26 NOTE — Assessment & Plan Note (Signed)
She is encouraged to strive for BMI less than 30 to decrease cardiac risk. Advised to aim for at least 150 minutes of exercise per week.  

## 2023-03-26 NOTE — Assessment & Plan Note (Signed)
Her a1c has been elevated in the past. I will recheck this today. She is encouraged to limit her intake of sugary beverages and foods, including diet drinks.

## 2023-03-26 NOTE — Assessment & Plan Note (Addendum)
She has been taking atorvastatin 20mg  daily without any issues. I will check lipid/liver panel today.  She agrees to f/u in 4-6 months for re-evaluation.

## 2023-03-29 ENCOUNTER — Ambulatory Visit: Payer: Medicare HMO | Admitting: Internal Medicine

## 2023-04-06 ENCOUNTER — Other Ambulatory Visit: Payer: Self-pay

## 2023-04-06 DIAGNOSIS — E7800 Pure hypercholesterolemia, unspecified: Secondary | ICD-10-CM

## 2023-05-15 ENCOUNTER — Other Ambulatory Visit: Payer: Medicare HMO

## 2023-05-15 DIAGNOSIS — E7800 Pure hypercholesterolemia, unspecified: Secondary | ICD-10-CM

## 2023-05-15 DIAGNOSIS — Z79899 Other long term (current) drug therapy: Secondary | ICD-10-CM | POA: Diagnosis not present

## 2023-05-16 LAB — LIPID PANEL
Chol/HDL Ratio: 2.8 {ratio} (ref 0.0–4.4)
Cholesterol, Total: 185 mg/dL (ref 100–199)
HDL: 65 mg/dL (ref >39)
LDL Chol Calc (NIH): 107 mg/dL — ABNORMAL HIGH (ref 0–99)
Triglycerides: 68 mg/dL (ref 0–149)
VLDL Cholesterol Cal: 13 mg/dL (ref 5–40)

## 2023-05-19 LAB — SPECIMEN STATUS REPORT

## 2023-05-19 LAB — ALT: ALT: 15 [IU]/L (ref 0–32)

## 2023-05-30 ENCOUNTER — Other Ambulatory Visit: Payer: Self-pay

## 2023-05-30 MED ORDER — ATORVASTATIN CALCIUM 40 MG PO TABS: ORAL_TABLET | ORAL | 1 refills | Status: DC

## 2023-06-07 ENCOUNTER — Encounter: Payer: Self-pay | Admitting: Internal Medicine

## 2023-06-07 ENCOUNTER — Ambulatory Visit (INDEPENDENT_AMBULATORY_CARE_PROVIDER_SITE_OTHER): Payer: Medicare HMO | Admitting: Internal Medicine

## 2023-06-07 VITALS — BP 122/80 | HR 80 | Temp 98.8°F | Ht 62.0 in | Wt 194.2 lb

## 2023-06-07 DIAGNOSIS — K219 Gastro-esophageal reflux disease without esophagitis: Secondary | ICD-10-CM | POA: Diagnosis not present

## 2023-06-07 DIAGNOSIS — Z6835 Body mass index (BMI) 35.0-35.9, adult: Secondary | ICD-10-CM | POA: Diagnosis not present

## 2023-06-07 DIAGNOSIS — R1033 Periumbilical pain: Secondary | ICD-10-CM | POA: Diagnosis not present

## 2023-06-07 DIAGNOSIS — E66812 Obesity, class 2: Secondary | ICD-10-CM | POA: Diagnosis not present

## 2023-06-07 MED ORDER — PANTOPRAZOLE SODIUM 40 MG PO TBEC: 40.0000 mg | DELAYED_RELEASE_TABLET | Freq: Every day | ORAL | 1 refills | Status: DC

## 2023-06-07 NOTE — Assessment & Plan Note (Signed)
She is encouraged to strive for BMI less than 30 to decrease cardiac risk. Advised to aim for at least 150 minutes of exercise per week.

## 2023-06-07 NOTE — Patient Instructions (Signed)
Abdominal Pain, Adult  Pain in the abdomen (abdominal pain) can be caused by many things. In most cases, it gets better with no treatment or by being treated at home. But in some cases, it can be serious. Your health care provider will ask questions about your medical history and do a physical exam to try to figure out what is causing your pain. Follow these instructions at home: Medicines Take over-the-counter and prescription medicines only as told by your provider. Do not take medicines that help you poop (laxatives) unless told by your provider. General instructions Watch your condition for any changes. Drink enough fluid to keep your pee (urine) pale yellow. Contact a health care provider if: Your pain changes, gets worse, or lasts longer than expected. You have severe cramping or bloating in your abdomen, or you vomit. Your pain gets worse with meals, after eating, or with certain foods. You are constipated or have diarrhea for more than 2-3 days. You are not hungry, or you lose weight without trying. You have signs of dehydration. These may include: Dark pee, very little pee, or no pee. Cracked lips or dry mouth. Sleepiness or weakness. You have pain when you pee (urinate) or poop. Your abdominal pain wakes you up at night. You have blood in your pee. You have a fever. Get help right away if: You cannot stop vomiting. Your pain is only in one part of the abdomen. Pain on the right side could be caused by appendicitis. You have bloody or black poop (stool), or poop that looks like tar. You have trouble breathing. You have chest pain. These symptoms may be an emergency. Get help right away. Call 911. Do not wait to see if the symptoms will go away. Do not drive yourself to the hospital. This information is not intended to replace advice given to you by your health care provider. Make sure you discuss any questions you have with your health care provider. Document Revised:  05/17/2022 Document Reviewed: 05/17/2022 Elsevier Patient Education  2024 Elsevier Inc.  

## 2023-06-07 NOTE — Progress Notes (Signed)
I,Victoria T Deloria Lair, CMA,acting as a Neurosurgeon for Gwynneth Aliment, MD.,have documented all relevant documentation on the behalf of Gwynneth Aliment, MD,as directed by  Gwynneth Aliment, MD while in the presence of Gwynneth Aliment, MD.  Subjective:  Patient ID: Julie Obrien , female    DOB: Jan 30, 1951 , 72 y.o.   MRN: [id number]  Chief Complaint  Patient presents with   Abdominal Pain    HPI  Patient presents today for further evaluation stomach issues. She states she has been experiencing tightness & pulling across her stomach.  It sometimes occur after eating. Sx initially started last week.  Initially, she felt she was constipated. At home she used Dulcolax & stool softeners. She reports this did help relieve her a little bit. There is also some relief with Gas-X. No recent change in appetite or eating habits; however, she does report eating much less than she has in the past.      Past Medical History:  Diagnosis Date   Arthritis    hnp, low back , knees - " all OA"   Complication of anesthesia    nausea one surgery more recent   History of kidney stones    Pneumonia    Feb.- Mar. 2017, not hospitalized, but pending surgery was cancelled ,since then, pt. seen by PCP, told that it was cleared     PONV (postoperative nausea and vomiting)      Family History  Problem Relation Age of Onset   Dementia Mother    Cancer Mother    Cancer Sister    Cancer Maternal Aunt      Current Outpatient Medications:    acetaminophen (TYLENOL) 650 MG CR tablet, Take 650 mg by mouth daily. Takes three tablets a few times a week. Does not take it every day., Disp: , Rfl:    pantoprazole (PROTONIX) 40 MG tablet, Take 1 tablet (40 mg total) by mouth daily., Disp: 30 tablet, Rfl: 1   Vitamin D, Ergocalciferol, (DRISDOL) 1.25 MG (50000 UNIT) CAPS capsule, Take 1 capsule (50,000 Units total) by mouth 2 (two) times a week. Take on Tues & Fri (Patient not taking: Reported on 03/15/2023), Disp: 8  capsule, Rfl: 1   Allergies  Allergen Reactions   Nisoldipine Hives   Augmentin [Amoxicillin-Pot Clavulanate] Nausea And Vomiting    "Tore up my stomach" (verified this with her daughter, a physician).     Sulfa Antibiotics Hives     Review of Systems  Constitutional: Negative.   Respiratory: Negative.    Cardiovascular: Negative.   Gastrointestinal:  Positive for abdominal pain and constipation. Negative for abdominal distention and blood in stool.  Neurological: Negative.   Psychiatric/Behavioral: Negative.       Today's Vitals   06/07/23 0849  BP: 122/80  Pulse: 80  Temp: 98.8 F (37.1 C)  SpO2: 98%  Weight: 194 lb 3.2 oz (88.1 kg)  Height: 5\' 2"  (1.575 m)   Body mass index is 35.52 kg/m.  Wt Readings from Last 3 Encounters:  06/07/23 194 lb 3.2 oz (88.1 kg)  03/15/23 193 lb 9.6 oz (87.8 kg)  07/26/22 193 lb 9.6 oz (87.8 kg)     Objective:  Physical Exam Vitals and nursing note reviewed.  Constitutional:      Appearance: Normal appearance.  HENT:     Head: Normocephalic and atraumatic.  Cardiovascular:     Rate and Rhythm: Normal rate and regular rhythm.     Heart sounds: Normal heart sounds.  Pulmonary:     Effort: Pulmonary effort is normal.     Breath sounds: Normal breath sounds.  Abdominal:     General: Bowel sounds are normal. There is no distension.     Palpations: Abdomen is soft.     Tenderness: There is abdominal tenderness in the periumbilical area.     Comments: Slight periumbilical tenderness, no guarding. Abdomen is soft  Skin:    General: Skin is warm.  Neurological:     General: No focal deficit present.     Mental Status: She is alert.  Psychiatric:        Mood and Affect: Mood normal.        Behavior: Behavior normal.         Assessment And Plan:  Periumbilical pain Assessment & Plan: I think her sx could be due to gas. I will send rx pantoprazole 40mg  daily. She will f/u in about six to eight weeks. If persistent or worsens  during this time period, she agrees to move forward with imaging.    Gastroesophageal reflux disease without esophagitis Assessment & Plan: I think she has developed GERD, she admits to increased belching.  She should stop eating 3 hours prior to lying down and avoid known triggers of her sx. If her sx do not improve, may need GI eval for EGD.   Class 2 severe obesity due to excess calories with serious comorbidity and body mass index (BMI) of 35.0 to 35.9 in adult [facility]) Assessment & Plan: She is encouraged to strive for BMI less than 30 to decrease cardiac risk. Advised to aim for at least 150 minutes of exercise per week.    Other orders -     Pantoprazole Sodium; Take 1 tablet (40 mg total) by mouth daily.  Dispense: 30 tablet; Refill: 1  She is encouraged to strive for BMI less than 30 to decrease cardiac risk. Advised to aim for at least 150 minutes of exercise per week.    Return if symptoms worsen or fail to improve, for 12/5 at 220pm.  Patient was given opportunity to ask questions. Patient verbalized understanding of the plan and was able to repeat key elements of the plan. All questions were answered to their satisfaction.    I, Gwynneth Aliment, MD, have reviewed all documentation for this visit. The documentation on 06/07/23 for the exam, diagnosis, procedures, and orders are all accurate and complete.  IF YOU HAVE BEEN REFERRED TO A SPECIALIST, IT MAY TAKE 1-2 WEEKS TO SCHEDULE/PROCESS THE REFERRAL. IF YOU HAVE NOT HEARD FROM US/SPECIALIST IN TWO WEEKS, PLEASE GIVE [place] A CALL AT [phone].   THE PATIENT IS ENCOURAGED TO PRACTICE SOCIAL DISTANCING DUE TO THE COVID-19 PANDEMIC.

## 2023-06-07 NOTE — Assessment & Plan Note (Addendum)
I think her sx could be due to gas. I will send rx pantoprazole 40mg  daily. She will f/u in about six to eight weeks. If persistent or worsens during this time period, she agrees to move forward with imaging.

## 2023-06-07 NOTE — Assessment & Plan Note (Signed)
I think she has developed GERD, she admits to increased belching.  She should stop eating 3 hours prior to lying down and avoid known triggers of her sx. If her sx do not improve, may need GI eval for EGD.

## 2023-06-15 DIAGNOSIS — H2513 Age-related nuclear cataract, bilateral: Secondary | ICD-10-CM | POA: Diagnosis not present

## 2023-07-19 ENCOUNTER — Ambulatory Visit: Payer: Self-pay | Admitting: Internal Medicine

## 2023-07-19 ENCOUNTER — Other Ambulatory Visit: Payer: Self-pay

## 2023-07-19 ENCOUNTER — Ambulatory Visit: Payer: Medicare HMO | Admitting: Internal Medicine

## 2023-07-23 ENCOUNTER — Ambulatory Visit: Payer: Medicare HMO | Admitting: Internal Medicine

## 2023-07-27 ENCOUNTER — Ambulatory Visit: Payer: Medicare HMO

## 2023-07-31 ENCOUNTER — Ambulatory Visit (INDEPENDENT_AMBULATORY_CARE_PROVIDER_SITE_OTHER): Payer: Medicare HMO

## 2023-07-31 DIAGNOSIS — Z0000 Encounter for general adult medical examination without abnormal findings: Secondary | ICD-10-CM | POA: Diagnosis not present

## 2023-07-31 NOTE — Progress Notes (Signed)
Subjective:   Julie Obrien is a 72 y.o. female who presents for Medicare Annual (Subsequent) preventive examination.  Visit Complete: Virtual I connected with  Julie Obrien on 07/31/23 by a audio enabled telemedicine application and verified that I am speaking with the correct person using two identifiers.  Patient Location: Home  Provider Location: Office/Clinic  I discussed the limitations of evaluation and management by telemedicine. The patient expressed understanding and agreed to proceed.  Vital Signs: Because this visit was a virtual/telehealth visit, some criteria may be missing or patient reported. Any vitals not documented were not able to be obtained and vitals that have been documented are patient reported.  Patient Medicare AWV questionnaire was completed by the patient on 07/31/2023; I have confirmed that all information answered by patient is correct and no changes since this date.  Cardiac Risk Factors include: advanced age (>65men, >87 women)     Objective:    Today's Vitals   There is no height or weight on file to calculate BMI.     06/28/2022   11:08 AM 06/22/2021    9:51 AM 03/04/2021   10:16 AM 02/17/2021   10:14 AM 06/16/2020    4:12 PM 09/11/2019    1:16 PM 04/23/2019   10:21 AM  Advanced Directives  Does Patient Have a Medical Advance Directive? No No No No No No No  Would patient like information on creating a medical advance directive? No - Patient declined  No - Patient declined No - Patient declined  No - Patient declined     Current Medications (verified) Outpatient Encounter Medications as of 07/31/2023  Medication Sig   acetaminophen (TYLENOL) 650 MG CR tablet Take 650 mg by mouth daily. Takes three tablets a few times a week. Does not take it every day.   pantoprazole (PROTONIX) 40 MG tablet Take 1 tablet (40 mg total) by mouth daily.   Vitamin D, Ergocalciferol, (DRISDOL) 1.25 MG (50000 UNIT) CAPS capsule Take 1 capsule (50,000 Units  total) by mouth 2 (two) times a week. Take on Tues & Fri (Patient not taking: Reported on 07/31/2023)   No facility-administered encounter medications on file as of 07/31/2023.    Allergies (verified) Nisoldipine, Augmentin [amoxicillin-pot clavulanate], and Sulfa antibiotics   History: Past Medical History:  Diagnosis Date   Allergy    Arthritis    hnp, low back , knees - " all OA"   Complication of anesthesia    nausea one surgery more recent   History of kidney stones    Pneumonia    Feb.- Mar. 2017, not hospitalized, but pending surgery was cancelled ,since then, pt. seen by PCP, told that it was cleared     PONV (postoperative nausea and vomiting)    Past Surgical History:  Procedure Laterality Date   BACK SURGERY  08/14/2006   CERVICAL DISCECTOMY  08/15/2007   CESAREAN SECTION     x4   CYST EXCISION     vocal cords?   CYSTO  08/14/1997   stone   HARDWARE REMOVAL N/A 06/21/2015   Procedure: Exploration of Fusion and removal of hardware right thoracic level one;  Surgeon: Donalee Citrin, MD;  Location: [facility];  Service: Neurosurgery;  Laterality: N/A;   LAMINECTOMY WITH POSTERIOR LATERAL ARTHRODESIS LEVEL 2 N/A 03/04/2021   Procedure: posterior lateral fusion - Thoracic eleven-Thoracic twelve - Thoracic twelve-Lumbar one, Redo Lumbar One-Two Posterior Lumbar Interbody Fusion;  Surgeon: Donalee Citrin, MD;  Location: [facility] OR;  Service: Neurosurgery;  Laterality: N/A;   POSTERIOR CERVICAL FUSION/FORAMINOTOMY N/A 05/31/2015   Procedure: Decompression and Laminectomy Cervical three-four  Cervical six-seven  Cervical seven-thoracic-one Posterior cervical fusion with lateral mass fixation;  Surgeon: Donalee Citrin, MD;  Location: [facility];  Service: Neurosurgery;  Laterality: N/A;   SPINE SURGERY  2016   TONSILLECTOMY     TUBAL LIGATION     Family History  Problem Relation Age of Onset   Dementia Mother    Cancer Mother    Arthritis Father    Cancer Sister    Cancer Maternal  Aunt    Social History   Socioeconomic History   Marital status: Married    Spouse name: Milliana Marinas Sr.   Number of children: 4   Years of education: Not on file   Highest education level: Associate degree: academic program  Occupational History   Occupation: retired  Tobacco Use   Smoking status: Every Day    Current packs/day: 0.25    Average packs/day: 0.3 packs/day for 32.0 years (8.0 ttl pk-yrs)    Types: Cigarettes   Smokeless tobacco: Current   Tobacco comments:    "I know I have to tell myself this is what I want to do but I am not ready to do that yet"  Vaping Use   Vaping status: Never Used  Substance and Sexual Activity   Alcohol use: No   Drug use: No   Sexual activity: Not Currently  Other Topics Concern   Not on file  Social History Narrative   Not on file   Social Drivers of Health   Financial Resource Strain: Low Risk  (07/30/2023)   Overall Financial Resource Strain (CARDIA)    Difficulty of Paying Living Expenses: Not hard at all  Food Insecurity: No Food Insecurity (07/30/2023)   Hunger Vital Sign    Worried About Running Out of Food in the Last Year: Never true    Ran Out of Food in the Last Year: Never true  Transportation Needs: No Transportation Needs (07/30/2023)   PRAPARE - Administrator, Civil Service (Medical): No    Lack of Transportation (Non-Medical): No  Physical Activity: Unknown (07/30/2023)   Exercise Vital Sign    Days of Exercise per Week: 0 days    Minutes of Exercise per Session: Not on file  Recent Concern: Physical Activity - Inactive (06/06/2023)   Exercise Vital Sign    Days of Exercise per Week: 0 days    Minutes of Exercise per Session: 0 min  Stress: No Stress Concern Present (07/30/2023)   [employer] of Occupational Health - Occupational Stress Questionnaire    Feeling of Stress : Not at all  Social Connections: Unknown (07/30/2023)   Social Connection and Isolation Panel [NHANES]     Frequency of Communication with Friends and Family: More than three times a week    Frequency of Social Gatherings with Friends and Family: Patient declined    Attends Religious Services: Patient declined    Database administrator or Organizations: No    Attends Engineer, structural: Not on file    Marital Status: Married    Tobacco Counseling Ready to quit: Yes Counseling given: Not Answered Tobacco comments: "I know I have to tell myself this is what I want to do but I am not ready to do that yet"   Clinical Intake:  Pre-visit preparation completed: Yes  Pain : No/denies pain     Nutritional Risks: None Diabetes: No  How often do you need to have someone help you when you read instructions, pamphlets, or other written materials from your doctor or pharmacy?: 1 - Never What is the last grade level you completed in school?: [employer]  Interpreter Needed?: No      Activities of Daily Living    07/31/2023    3:27 PM 07/18/2023    2:40 PM  In your present state of health, do you have any difficulty performing the following activities:  Hearing?  0   Vision? 0 0   Difficulty concentrating or making decisions? 0 0   Walking or climbing stairs? 0 0   Dressing or bathing? 0 0   Doing errands, shopping? 0 0   Preparing Food and eating ? N N   Using the Toilet? N N   In the past six months, have you accidently leaked urine? N N   Do you have problems with loss of bowel control? N N   Managing your Medications? N N   Managing your Finances? N N   Housekeeping or managing your Housekeeping? N N      Patient-reported    Patient Care Team: Dorothyann Peng, MD as PCP - General  Indicate any recent Medical Services you may have received from other than [facility] providers in the past year (date may be approximate).     Assessment:   This is a routine wellness examination for Icie.  Hearing/Vision screen No results found.   Goals Addressed               This  Visit's Progress     Weight Loss (pt-stated)        She would like to lose a few pounds. She wants to try to stay away from sweets, drink more water & be more active.       Depression Screen    07/31/2023    3:30 PM 07/26/2022   11:23 AM 07/26/2022   11:20 AM 06/28/2022   11:11 AM 06/22/2021    9:52 AM 06/16/2020    4:13 PM 06/26/2019    2:20 PM  PHQ 2/9 Scores  PHQ - 2 Score 0 0 0 0 0 0 0    Fall Risk    07/31/2023    3:30 PM 07/18/2023    2:40 PM 07/26/2022   11:20 AM 06/28/2022   11:10 AM 06/22/2021    9:52 AM  Fall Risk   Falls in the past year? 0 0  1 1 0  Comment    trying to stop a frog   Number falls in past yr: 0  0 0   Injury with Fall? 0  0 0   Risk for fall due to : No Fall Risks  History of fall(s) Medication side effect Medication side effect  Follow up Falls evaluation completed  Falls evaluation completed Falls prevention discussed;Education provided;Falls evaluation completed Falls evaluation completed;Education provided;Falls prevention discussed     Patient-reported    MEDICARE RISK AT HOME:      Cognitive Function:        07/31/2023    3:30 PM 06/28/2022   11:12 AM 06/22/2021    9:54 AM 06/16/2020    4:14 PM 04/23/2019   10:23 AM  6CIT Screen  What Year? 0 points 0 points 0 points 0 points 0 points  What month? 0 points 0 points 0 points 0 points 0 points  What time? 0 points 0 points 0 points 0 points 0 points  Count  back from 20 0 points 0 points 0 points 0 points 0 points  Months in reverse 0 points 0 points 0 points 0 points 0 points  Repeat phrase 2 points 2 points 0 points 0 points 0 points  Total Score 2 points 2 points 0 points 0 points 0 points    Immunizations Immunization History  Administered Date(s) Administered   PFIZER(Purple Top)SARS-COV-2 Vaccination 09/20/2019, 10/11/2019   Pneumococcal Polysaccharide-23 05/09/2010   Tdap 05/18/2011, 03/15/2023    TDAP status: Up to date  Flu Vaccine status: Up to  date  Pneumococcal vaccine status: Up to date  Covid-19 vaccine status: Completed vaccines  Qualifies for Shingles Vaccine? Yes   Zostavax completed No   Shingrix Completed?: No.    Education has been provided regarding the importance of this vaccine. Patient has been advised to call insurance company to determine out of pocket expense if they have not yet received this vaccine. Advised may also receive vaccine at local pharmacy or Health Dept. Verbalized acceptance and understanding.  Screening Tests Health Maintenance  Topic Date Due   Zoster Vaccines- Shingrix (1 of 2) Never done   Pneumonia Vaccine 32+ Years old (2 of 2 - PCV) 05/10/2011   COVID-19 Vaccine (3 - Pfizer risk series) 11/08/2019   INFLUENZA VACCINE  11/12/2023 (Originally 03/15/2023)   MAMMOGRAM  03/14/2024   Medicare Annual Wellness (AWV)  07/30/2024   Colonoscopy  04/12/2032   DTaP/Tdap/Td (3 - Td or Tdap) 03/14/2033   DEXA SCAN  Completed   Hepatitis C Screening  Completed   HPV VACCINES  Aged Out    Health Maintenance  Health Maintenance Due  Topic Date Due   Zoster Vaccines- Shingrix (1 of 2) Never done   Pneumonia Vaccine 67+ Years old (2 of 2 - PCV) 05/10/2011   COVID-19 Vaccine (3 - Pfizer risk series) 11/08/2019    Colorectal cancer screening: Type of screening: Colonoscopy. Completed 04/12/2022. Repeat every 10 years  Mammogram status: Completed 03/15/2023. Repeat every year  Bone Density status: Completed 04/07/2022. Results reflect: Bone density results: NORMAL. Repeat every 3 years.  Lung Cancer Screening: (Low Dose CT Chest recommended if Age 50-80 years, 20 pack-year currently smoking OR have quit w/in 15years.) does qualify.    Additional Screening:  Hepatitis C Screening: does qualify; Completed 01/09/2013  Dental Screening: Recommended annual dental exams for proper oral hygiene  Community Resource Referral / Chronic Care Management: CRR required this visit?  No   CCM required this  visit?  No     Plan:     I have personally reviewed and noted the following in the patient's chart:   Medical and social history Use of alcohol, tobacco or illicit drugs  Current medications and supplements including opioid prescriptions. Patient is not currently taking opioid prescriptions. Functional ability and status Nutritional status Physical activity Advanced directives List of other physicians Hospitalizations, surgeries, and ER visits in previous 12 months Vitals Screenings to include cognitive, depression, and falls Referrals and appointments  In addition, I have reviewed and discussed with patient certain preventive protocols, quality metrics, and best practice recommendations. A written personalized care plan for preventive services as well as general preventive health recommendations were provided to patient.     Coolidge Breeze, CMA   07/31/2023   After Visit Summary: (MyChart) Due to this being a telephonic visit, the after visit summary with patients personalized plan was offered to patient via MyChart

## 2023-07-31 NOTE — Patient Instructions (Signed)

## 2023-09-19 ENCOUNTER — Ambulatory Visit: Payer: Medicare HMO | Admitting: Internal Medicine

## 2023-10-29 ENCOUNTER — Ambulatory Visit (INDEPENDENT_AMBULATORY_CARE_PROVIDER_SITE_OTHER): Payer: Self-pay | Admitting: Internal Medicine

## 2023-10-29 ENCOUNTER — Encounter: Payer: Self-pay | Admitting: Internal Medicine

## 2023-10-29 VITALS — BP 130/70 | HR 76 | Temp 98.2°F | Ht 62.0 in | Wt 193.2 lb

## 2023-10-29 DIAGNOSIS — R7309 Other abnormal glucose: Secondary | ICD-10-CM | POA: Diagnosis not present

## 2023-10-29 DIAGNOSIS — E7800 Pure hypercholesterolemia, unspecified: Secondary | ICD-10-CM

## 2023-10-29 DIAGNOSIS — Z6835 Body mass index (BMI) 35.0-35.9, adult: Secondary | ICD-10-CM

## 2023-10-29 DIAGNOSIS — K219 Gastro-esophageal reflux disease without esophagitis: Secondary | ICD-10-CM

## 2023-10-29 DIAGNOSIS — E66812 Obesity, class 2: Secondary | ICD-10-CM | POA: Diagnosis not present

## 2023-10-29 MED ORDER — PANTOPRAZOLE SODIUM 40 MG PO TBEC: 40.0000 mg | DELAYED_RELEASE_TABLET | Freq: Every day | ORAL | 1 refills | Status: DC | PRN

## 2023-10-29 MED ORDER — ATORVASTATIN CALCIUM 20 MG PO TABS: 20.0000 mg | ORAL_TABLET | Freq: Every day | ORAL | 2 refills | Status: AC

## 2023-10-29 NOTE — Assessment & Plan Note (Signed)
 She is encouraged to strive for BMI less than 30 to decrease cardiac risk. Advised to aim for at least 150 minutes of exercise per week.

## 2023-10-29 NOTE — Assessment & Plan Note (Signed)
Her a1c has been elevated in the past. I will recheck this today. She is encouraged to limit her intake of sugary beverages and foods, including diet drinks.

## 2023-10-29 NOTE — Assessment & Plan Note (Signed)
 Chronic, her sx seem to have improved. She would like to take prn now and requests a refill.  She should stop eating 3 hours prior to lying down and avoid known triggers of her sx.

## 2023-10-29 NOTE — Progress Notes (Signed)
 I,Victoria T Deloria Lair, CMA,acting as a Neurosurgeon for Gwynneth Aliment, MD.,have documented all relevant documentation on the behalf of Gwynneth Aliment, MD,as directed by  Gwynneth Aliment, MD while in the presence of Gwynneth Aliment, MD.  Subjective:  Patient ID: Julie Obrien , female    DOB: 31-Oct-1950 , 73 y.o.   MRN: [id number]  Chief Complaint  Patient presents with   Hyperlipidemia    HPI  Patient presents today for cholesterol & abnormal glucose follow up. She admits being non compliant. She has not been taking Atorvastatin or Pantoprazole. She forgets to take medicine. She has been busy with the care of her husband who is new to dialysis.  She has no specific questions or concerns.      Past Medical History:  Diagnosis Date   Allergy    Arthritis    hnp, low back , knees - " all OA"   Complication of anesthesia    nausea one surgery more recent   History of kidney stones    Pneumonia    Feb.- Mar. 2017, not hospitalized, but pending surgery was cancelled ,since then, pt. seen by PCP, told that it was cleared     PONV (postoperative nausea and vomiting)      Family History  Problem Relation Age of Onset   Dementia Mother    Cancer Mother    Arthritis Father    Cancer Sister    Cancer Maternal Aunt      Current Outpatient Medications:    acetaminophen (TYLENOL) 650 MG CR tablet, Take 650 mg by mouth daily. Takes three tablets a few times a week. Does not take it every day., Disp: , Rfl:    atorvastatin (LIPITOR) 20 MG tablet, Take 1 tablet (20 mg total) by mouth daily., Disp: 90 tablet, Rfl: 2   pantoprazole (PROTONIX) 40 MG tablet, Take 1 tablet (40 mg total) by mouth daily as needed., Disp: 30 tablet, Rfl: 1   Vitamin D, Ergocalciferol, (DRISDOL) 1.25 MG (50000 UNIT) CAPS capsule, Take 1 capsule (50,000 Units total) by mouth 2 (two) times a week. Take on Tues & Fri (Patient not taking: Reported on 03/15/2023), Disp: 8 capsule, Rfl: 1   Allergies  Allergen Reactions    Nisoldipine Hives   Augmentin [Amoxicillin-Pot Clavulanate] Nausea And Vomiting    "Tore up my stomach" (verified this with her daughter, a physician).     Sulfa Antibiotics Hives     Review of Systems  Constitutional: Negative.   Respiratory: Negative.    Cardiovascular: Negative.   Gastrointestinal: Negative.   Neurological: Negative.   Psychiatric/Behavioral: Negative.       Today's Vitals   10/29/23 1451  BP: 130/70  Pulse: 76  Temp: 98.2 F (36.8 C)  SpO2: 98%  Weight: 193 lb 3.2 oz (87.6 kg)  Height: 5\' 2"  (1.575 m)   Body mass index is 35.34 kg/m.  Wt Readings from Last 3 Encounters:  10/29/23 193 lb 3.2 oz (87.6 kg)  06/07/23 194 lb 3.2 oz (88.1 kg)  03/15/23 193 lb 9.6 oz (87.8 kg)     Objective:  Physical Exam Vitals and nursing note reviewed.  Constitutional:      Appearance: Normal appearance. She is obese.  HENT:     Head: Normocephalic and atraumatic.  Eyes:     Extraocular Movements: Extraocular movements intact.  Cardiovascular:     Rate and Rhythm: Normal rate and regular rhythm.     Heart sounds: Normal heart sounds.  Pulmonary:     Effort: Pulmonary effort is normal.     Breath sounds: Normal breath sounds.  Musculoskeletal:     Cervical back: Normal range of motion.  Skin:    General: Skin is warm.  Neurological:     General: No focal deficit present.     Mental Status: She is alert.  Psychiatric:        Mood and Affect: Mood normal.        Behavior: Behavior normal.         Assessment And Plan:  Pure hypercholesterolemia Assessment & Plan: Chronic, she has been prescribed atorvastatin 20mg  daily; however, admits non-compliance. Encouraged to take daily as prescribed.  Importance of medication/dietary compliance was discussed with the patient.   Orders: -     CMP14+EGFR  Gastroesophageal reflux disease without esophagitis Assessment & Plan: Chronic, her sx seem to have improved. She would like to take prn now and requests  a refill.  She should stop eating 3 hours prior to lying down and avoid known triggers of her sx.    Other abnormal glucose Assessment & Plan: Her a1c has been elevated in the past. I will recheck this today. She is encouraged to limit her intake of sugary beverages and foods, including diet drinks.    Orders: -     Hemoglobin A1c  Class 2 severe obesity due to excess calories with serious comorbidity and body mass index (BMI) of 35.0 to 35.9 in adult [facility]) Assessment & Plan: She is encouraged to strive for BMI less than 30 to decrease cardiac risk. Advised to aim for at least 150 minutes of exercise per week.    Other orders -     Atorvastatin Calcium; Take 1 tablet (20 mg total) by mouth daily.  Dispense: 90 tablet; Refill: 2 -     Pantoprazole Sodium; Take 1 tablet (40 mg total) by mouth daily as needed.  Dispense: 30 tablet; Refill: 1     Return for 3 month - prediabetes check.  Patient was given opportunity to ask questions. Patient verbalized understanding of the plan and was able to repeat key elements of the plan. All questions were answered to their satisfaction.    I, Gwynneth Aliment, MD, have reviewed all documentation for this visit. The documentation on 10/29/23 for the exam, diagnosis, procedures, and orders are all accurate and complete.   IF YOU HAVE BEEN REFERRED TO A SPECIALIST, IT MAY TAKE 1-2 WEEKS TO SCHEDULE/PROCESS THE REFERRAL. IF YOU HAVE NOT HEARD FROM US/SPECIALIST IN TWO WEEKS, PLEASE GIVE [place] A CALL AT [phone] X [phone].   THE PATIENT IS ENCOURAGED TO PRACTICE SOCIAL DISTANCING DUE TO THE COVID-19 PANDEMIC.

## 2023-10-29 NOTE — Assessment & Plan Note (Signed)
 Chronic, she has been prescribed atorvastatin 20mg  daily; however, admits non-compliance. Encouraged to take daily as prescribed.  Importance of medication/dietary compliance was discussed with the patient.

## 2023-10-29 NOTE — Patient Instructions (Signed)
 Mediterranean Diet A Mediterranean diet is based on the traditions of countries on the [employer]. It focuses on eating more: Fruits and vegetables. Whole grains, beans, nuts, and seeds. Heart-healthy fats. These are fats that are good for your heart. It involves eating less: Dairy. Meat and eggs. Processed foods with added sugar, salt, and fat. This type of diet can help prevent certain conditions. It can also improve outcomes if you have a long-term (chronic) disease, such as kidney or heart disease. What are tips for following this plan? Reading food labels Check packaged foods for: The serving size. For foods such as rice and pasta, the serving size is the amount of cooked product, not dry. The total fat. Avoid foods with saturated fat or trans fat. Added sugars, such as corn syrup. Shopping  Try to have a balanced diet. Buy a variety of foods, such as: Fresh fruits and vegetables. You may be able to get these from local farmers markets. You can also buy them frozen. Grains, beans, nuts, and seeds. Some of these can be bought in bulk. Fresh seafood. Poultry and eggs. Low-fat dairy products. Buy whole ingredients instead of foods that have already been packaged. If you can't get fresh seafood, buy precooked frozen shrimp or canned fish, such as tuna, salmon, or sardines. Stock your pantry so you always have certain foods on hand, such as olive oil, canned tuna, canned tomatoes, rice, pasta, and beans. Cooking Cook foods with extra-virgin olive oil instead of using butter or other vegetable oils. Have meat as a side dish. Have vegetables or grains as your main dish. This means having meat in small portions or adding small amounts of meat to foods like pasta or stew. Use beans or vegetables instead of meat in common dishes like chili or lasagna. Try out different cooking methods. Try roasting, broiling, steaming, and sauting vegetables. Add frozen vegetables to soups, stews,  pasta, or rice. Add nuts or seeds for added healthy fats and plant protein at each meal. You can add these to yogurt, salads, or vegetable dishes. Marinate fish or vegetables using olive oil, lemon juice, garlic, and fresh herbs. Meal planning Plan to eat a vegetarian meal one day each week. Try to work up to two vegetarian meals, if possible. Eat seafood two or more times a week. Have healthy snacks on hand. These may include: Vegetable sticks with hummus. Greek yogurt. Fruit and nut trail mix. Eat balanced meals. These should include: Fruit: 2-3 servings a day. Vegetables: 4-5 servings a day. Low-fat dairy: 2 servings a day. Fish, poultry, or lean meat: 1 serving a day. Beans and legumes: 2 or more servings a week. Nuts and seeds: 1-2 servings a day. Whole grains: 6-8 servings a day. Extra-virgin olive oil: 3-4 servings a day. Limit red meat and sweets to just a few servings a month. Lifestyle  Try to cook and eat meals with your family. Drink enough fluid to keep your pee (urine) pale yellow. Be active every day. This includes: Aerobic exercise, which is exercise that causes your heart to beat faster. Examples include running and swimming. Leisure activities like gardening, walking, or housework. Get 7-8 hours of sleep each night. Drink red wine if your provider says you can. A glass of wine is 5 oz (150 mL). You may be allowed to have: Up to 1 glass a day if you're female and not pregnant. Up to 2 glasses a day if you're female. What foods should I eat? Fruits Apples. Apricots. Avocado.  Berries. Bananas. Cherries. Dates. Figs. Grapes. Lemons. Melon. Oranges. Peaches. Plums. Pomegranate. Vegetables Artichokes. Beets. Broccoli. Cabbage. Carrots. Eggplant. Green beans. Chard. Kale. Spinach. Onions. Leeks. Peas. Squash. Tomatoes. Peppers. Radishes. Grains Whole-grain pasta. Brown rice. Bulgur wheat. Polenta. Couscous. Whole-wheat bread. Orpah Cobb. Meats and other  proteins Beans. Almonds. Sunflower seeds. Pine nuts. Peanuts. Cod. Salmon. Scallops. Shrimp. Tuna. Tilapia. Clams. Oysters. Eggs. Chicken or [place] without skin. Dairy Low-fat milk. Cheese. Greek yogurt. Fats and oils Extra-virgin olive oil. Avocado oil. Grapeseed oil. Beverages Water. Red wine. Herbal tea. Sweets and desserts Greek yogurt with honey. Baked apples. Poached pears. Trail mix. Seasonings and condiments Basil. Cilantro. Coriander. Cumin. Mint. Parsley. Sage. Rosemary. Tarragon. Garlic. Oregano. Thyme. Pepper. Balsamic vinegar. Tahini. Hummus. Tomato sauce. Olives. Mushrooms. The items listed above may not be all the foods and drinks you can have. Talk to a dietitian to learn more. What foods should I limit? This is a list of foods that should be eaten rarely. Fruits Fruit canned in syrup. Vegetables Deep-fried potatoes, like [place] fries. Grains Packaged pasta or rice dishes. Cereal with added sugar. Snacks with added sugar. Meats and other proteins Beef. Pork. Lamb. Chicken or [place] with skin. Hot dogs. Tomasa Blase. Dairy Ice cream. Sour cream. Whole milk. Fats and oils Butter. Canola oil. Vegetable oil. Beef fat (tallow). Lard. Beverages Juice. Sugar-sweetened soft drinks. Beer. Liquor and spirits. Sweets and desserts Cookies. Cakes. Pies. Candy. Seasonings and condiments Mayonnaise. Pre-made sauces and marinades. The items listed above may not be all the foods and drinks you should limit. Talk to a dietitian to learn more. Where to find more information American Heart Association (AHA): heart.org This information is not intended to replace advice given to you by your health care provider. Make sure you discuss any questions you have with your health care provider. Document Revised: 11/12/2022 Document Reviewed: 11/12/2022 Elsevier Patient Education  2024 [employer].

## 2023-10-30 LAB — CMP14+EGFR
ALT: 6 IU/L (ref 0–32)
AST: 14 IU/L (ref 0–40)
Albumin: 4.3 g/dL (ref 3.8–4.8)
Alkaline Phosphatase: 102 IU/L (ref 44–121)
BUN/Creatinine Ratio: 15 (ref 12–28)
BUN: 10 mg/dL (ref 8–27)
Bilirubin Total: 0.3 mg/dL (ref 0.0–1.2)
CO2: 23 mmol/L (ref 20–29)
Calcium: 9.8 mg/dL (ref 8.7–10.3)
Chloride: 107 mmol/L — ABNORMAL HIGH (ref 96–106)
Creatinine, Ser: 0.66 mg/dL (ref 0.57–1.00)
Globulin, Total: 2.2 g/dL (ref 1.5–4.5)
Glucose: 89 mg/dL (ref 70–99)
Potassium: 5 mmol/L (ref 3.5–5.2)
Sodium: 144 mmol/L (ref 134–144)
Total Protein: 6.5 g/dL (ref 6.0–8.5)
eGFR: 93 mL/min/{1.73_m2} (ref >59)

## 2023-10-30 LAB — HEMOGLOBIN A1C
Est. average glucose Bld gHb Est-mCnc: 126 mg/dL
Hgb A1c MFr Bld: 6 % — ABNORMAL HIGH (ref 4.8–5.6)

## 2023-12-27 ENCOUNTER — Encounter (HOSPITAL_COMMUNITY): Payer: Self-pay | Admitting: *Deleted

## 2023-12-27 ENCOUNTER — Ambulatory Visit (INDEPENDENT_AMBULATORY_CARE_PROVIDER_SITE_OTHER)

## 2023-12-27 ENCOUNTER — Other Ambulatory Visit: Payer: Self-pay

## 2023-12-27 ENCOUNTER — Ambulatory Visit (HOSPITAL_COMMUNITY): Admission: EM | Admit: 2023-12-27 | Discharge: 2023-12-27 | Disposition: A | Discharge status: Home / Self Care

## 2023-12-27 DIAGNOSIS — R062 Wheezing: Secondary | ICD-10-CM

## 2023-12-27 DIAGNOSIS — J069 Acute upper respiratory infection, unspecified: Secondary | ICD-10-CM

## 2023-12-27 DIAGNOSIS — R0602 Shortness of breath: Secondary | ICD-10-CM

## 2023-12-27 LAB — POC COVID19/FLU A&B COMBO
Covid Antigen, POC: NEGATIVE
Influenza A Antigen, POC: NEGATIVE
Influenza B Antigen, POC: NEGATIVE

## 2023-12-27 MED ORDER — ALBUTEROL SULFATE HFA 108 (90 BASE) MCG/ACT IN AERS: 1.0000 | INHALATION_SPRAY | Freq: Four times a day (QID) | RESPIRATORY_TRACT | 0 refills | Status: AC | PRN

## 2023-12-27 MED ORDER — IPRATROPIUM-ALBUTEROL 0.5-2.5 (3) MG/3ML IN SOLN
RESPIRATORY_TRACT | Status: AC
Start: 2023-12-27 — End: ?
  Filled 2023-12-27: qty 3

## 2023-12-27 MED ORDER — IPRATROPIUM-ALBUTEROL 0.5-2.5 (3) MG/3ML IN SOLN
3.0000 mL | Freq: Once | RESPIRATORY_TRACT | Status: AC
Start: 2023-12-27 — End: 2023-12-27
  Administered 2023-12-27: 3 mL via RESPIRATORY_TRACT

## 2023-12-27 NOTE — Discharge Instructions (Addendum)
 The radiologist did not see anything of concern on your x-ray.  Additionally, you tested negative for COVID and influenza today.  We have refilled your albuterol  inhaler and sent it to the pharmacy.  We recommend following up with your primary care provider in the next several days if symptoms are not improving.  Please go the emergency department if you develop any chest pain, shortness of breath, worsening of symptoms, or if you have any other concerns.

## 2023-12-27 NOTE — ED Triage Notes (Addendum)
 PT reports she became Short of breath this afternoon. Pt was started on Zithromax   and tessalon  this afternoon. Pt has also developed a fever. PT has a albuterol  inhaler but did not use it today.

## 2023-12-27 NOTE — ED Provider Notes (Signed)
 [facility]    CSN: [id number] Arrival date & time: 12/27/23  1920      History   Chief Complaint Chief Complaint  Patient presents with   Shortness of Breath   Fever    HPI Julie Obrien is a 73 y.o. female.   Patient is a 73 year old female who presents to the urgent care today with concerns of shortness of breath, cough, diarrhea, and fever.  She reports her symptoms began yesterday.  She called her child who is a an ED physician and they sent in Tessalon  Perles and azithromycin  for her.  She took the doses of that today.  She reports having a fever of 101 degrees around 6 PM today but did not take anything to bring it down at that time. She is an active smoker and has been told that she has COPD but is not on any medications for it.  She does have an albuterol  inhaler that she used yesterday and early this morning but has not used it during the rest of the day.  She denies any hemoptysis, chest pain, vomiting, rash, or other concerns at this time.    Past Medical History:  Diagnosis Date   Allergy    Arthritis    hnp, low back , knees - " all OA"   Complication of anesthesia    nausea one surgery more recent   History of kidney stones    Pneumonia    Feb.- Mar. 2017, not hospitalized, but pending surgery was cancelled ,since then, pt. seen by PCP, told that it was cleared     PONV (postoperative nausea and vomiting)     Patient Active Problem List   Diagnosis Date Noted   Periumbilical pain 06/07/2023   Coronary artery disease due to lipid rich plaque 03/26/2023   Primary insomnia 08/09/2022   Other abnormal glucose 08/09/2022   Weight gain 08/09/2022   Gastroesophageal reflux disease without esophagitis 08/09/2022   Pure hypercholesterolemia 04/05/2022   Estrogen deficiency 04/05/2022   Class 2 severe obesity due to excess calories with serious comorbidity and body mass index (BMI) of 35.0 to 35.9 in adult (HCC) 04/05/2022   Fatigue fracture of  vertebra, lumbar region, initial encounter for fracture 03/04/2021   Adult BMI 32.0-32.9 kg/sq m 10/13/2018   Hypoxia 09/15/2018   Tobacco abuse 09/15/2018   CAP (community acquired pneumonia) 09/15/2018   Influenza A 09/12/2018   Asthma with acute exacerbation 02/10/2018   Spinal stenosis of lumbar region 01/05/2016   Closed fracture of thoracic vertebra, initial encounter (HCC) 06/21/2015   Myelopathy, spondylogenic, cervical 05/31/2015    Past Surgical History:  Procedure Laterality Date   BACK SURGERY  08/14/2006   CERVICAL DISCECTOMY  08/15/2007   CESAREAN SECTION     x4   CYST EXCISION     vocal cords?   CYSTO  08/14/1997   stone   HARDWARE REMOVAL N/A 06/21/2015   Procedure: Exploration of Fusion and removal of hardware right thoracic level one;  Surgeon: Gearl Keens, MD;  Location: [facility];  Service: Neurosurgery;  Laterality: N/A;   LAMINECTOMY WITH POSTERIOR LATERAL ARTHRODESIS LEVEL 2 N/A 03/04/2021   Procedure: posterior lateral fusion - Thoracic eleven-Thoracic twelve - Thoracic twelve-Lumbar one, Redo Lumbar One-Two Posterior Lumbar Interbody Fusion;  Surgeon: Gearl Keens, MD;  Location: [facility];  Service: Neurosurgery;  Laterality: N/A;   POSTERIOR CERVICAL FUSION/FORAMINOTOMY N/A 05/31/2015   Procedure: Decompression and Laminectomy Cervical three-four  Cervical six-seven  Cervical seven-thoracic-one Posterior  cervical fusion with lateral mass fixation;  Surgeon: Gearl Keens, MD;  Location: [facility];  Service: Neurosurgery;  Laterality: N/A;   SPINE SURGERY  2016   TONSILLECTOMY     TUBAL LIGATION      OB History   No obstetric history on file.      Home Medications    Prior to Admission medications   Medication Sig Start Date End Date Taking? Authorizing Provider  acetaminophen  (TYLENOL ) 650 MG CR tablet Take 650 mg by mouth daily. Takes three tablets a few times a week. Does not take it every day.   Yes [provider]  atorvastatin  (LIPITOR)  20 MG tablet Take 1 tablet (20 mg total) by mouth daily. 10/29/23  Yes Cleave Curling, MD  pantoprazole  (PROTONIX ) 40 MG tablet Take 1 tablet (40 mg total) by mouth daily as needed. 10/29/23 10/28/24 Yes Cleave Curling, MD  Vitamin D , Ergocalciferol , (DRISDOL ) 1.25 MG (50000 UNIT) CAPS capsule Take 1 capsule (50,000 Units total) by mouth 2 (two) times a week. Take on Tues & Fri 07/03/22  Yes Cleave Curling, MD    Family History Family History  Problem Relation Age of Onset   Dementia Mother    Cancer Mother    Arthritis Father    Cancer Sister    Cancer Maternal Aunt     Social History Social History   Tobacco Use   Smoking status: Every Day    Current packs/day: 0.25    Average packs/day: 0.3 packs/day for 32.0 years (8.0 ttl pk-yrs)    Types: Cigarettes   Smokeless tobacco: Current   Tobacco comments:    "I know I have to tell myself this is what I want to do but I am not ready to do that yet"  Vaping Use   Vaping status: Never Used  Substance Use Topics   Alcohol use: No   Drug use: No     Allergies   Nisoldipine, Augmentin [amoxicillin-pot clavulanate], and Sulfa antibiotics   Review of Systems Review of Systems See HPI for all and ROS.  Physical Exam Triage Vital Signs ED Triage Vitals  Encounter Vitals Group     BP 12/27/23 1927 (!) 148/57     Systolic BP Percentile --      Diastolic BP Percentile --      Pulse Rate 12/27/23 1927 100     Resp 12/27/23 1927 (!) 22     Temp 12/27/23 1927 99.6 F (37.6 C)     Temp src --      SpO2 12/27/23 1927 96 %     Weight --      Height --      Head Circumference --      Peak Flow --      Pain Score 12/27/23 1928 0     Pain Loc --      Pain Education --      Exclude from Growth Chart --    No data found.  Updated Vital Signs BP (!) 148/57   Pulse 100   Temp 99.6 F (37.6 C)   Resp (!) 22   SpO2 96%   Visual Acuity Right Eye Distance:   Left Eye Distance:   Bilateral Distance:    Right Eye Near:    Left Eye Near:    Bilateral Near:     Physical Exam General: Alert and oriented, well-developed/well-nourished, calm, cooperative, no acute distress HEENT: Normocephalic atraumatic, moist mucous membranes, no scleral icterus, trachea midline Lungs: Speaking  full sentences, non-labored respirations, no distress, slight wheezing present diffusely Heart: Regular rate and rhythm Abdomen:  Soft, nondistended Musculoskeletal: Moves all extremities well Neurologic: Awake, A&O x4, gait normal Integumentary: Warm, dry, normal for ethnicity, intact, no rash Psychiatric: Appropriate mood & affect  UC Treatments / Results  Labs (all labs ordered are listed, but only abnormal results are displayed) Labs Reviewed - No data to display  EKG   Radiology No results found.  Procedures Procedures (including critical care time)  Medications Ordered in UC Medications  ipratropium-albuterol  (DUONEB) 0.5-2.5 (3) MG/3ML nebulizer solution 3 mL (3 mLs Nebulization Given 12/27/23 1942)    Initial Impression / Assessment and Plan / UC Course  I have reviewed the triage vital signs and the nursing notes.  Pertinent labs & imaging results that were available during my care of the patient were reviewed by me and considered in my medical decision making (see chart for details).    Presents with shortness of breath, cough, fever, congestion, and diarrhea.  Differential diagnosis includes: URI, COVID, influenza, sinusitis, pneumonia, CHF or COPD exacerbation, PE, allergies, including other diagnoses.  History obtained from: Patient.  Plan at this time/rationale: Chest x-ray, respiratory panel.  All ordered tests including imaging and labs were independently reviewed and interpreted by myself. Notable findings: No pneumothorax.  Negative for COVID and influenza.  Plan: This patient presents with symptoms suspicious for likely viral upper respiratory infection.  X-ray was obtained and radiologist  reports no acute pulmonary abnormality.  Based on history and physical doubt bacterial sinusitis. Do not suspect underlying cardiopulmonary process. I considered, but think unlikely, dangerous causes of this patient's symptoms to include ACS, CHF or COPD exacerbations, pneumonia, or pneumothorax.  Patient was given a dose of albuterol /ipratropium for her breathing.  She reports that her breathing improved significantly afterwards.  Patient has an albuterol  inhaler at home that we discussed that she should use over the next day to help with her symptoms.  Patient does think her inhaler is expired so we refilled it for her.  Discussed smoking sensation. Patient is nontoxic appearing, stable, and in no acute distress, therefore, patient likely stable for safe discharge home. Gave patient recommendations for over-the-counter medicines and remedies for symptomatic relief. Patient should follow-up with their primary care provider in the next several days.  Strict return precautions to the urgent care or emergency department were discussed including if symptoms worsen, if they become short of breath, or if they have any other concerns.  Disposition: Stable to discharge home.  All questions answered to the best of this examiner's ability. Advised to f/u with PCP for further eval and/or reassessment. Patient agrees to plan.  An appropriate evaluation has been performed, and in my medical judgment there is currently no evidence of an immediate life-threatening or surgical condition. Discharge is therefore indicated at this time.  This document was created using the aid of voice recognition Scientist, clinical (histocompatibility and immunogenetics).   Final Clinical Impressions(s) / UC Diagnoses   Final diagnoses:  Shortness of breath   Discharge Instructions   None    ED Prescriptions   None    PDMP not reviewed this encounter.   Edna Gouty, PA-C 12/27/23 2103

## 2024-01-01 ENCOUNTER — Encounter: Payer: Self-pay | Admitting: Internal Medicine

## 2024-01-01 ENCOUNTER — Ambulatory Visit (INDEPENDENT_AMBULATORY_CARE_PROVIDER_SITE_OTHER): Admitting: Internal Medicine

## 2024-01-01 VITALS — BP 122/74 | HR 85 | Temp 98.4°F | Ht 62.0 in | Wt 185.6 lb

## 2024-01-01 DIAGNOSIS — R197 Diarrhea, unspecified: Secondary | ICD-10-CM | POA: Diagnosis not present

## 2024-01-01 DIAGNOSIS — J209 Acute bronchitis, unspecified: Secondary | ICD-10-CM | POA: Diagnosis not present

## 2024-01-01 MED ORDER — ALBUTEROL SULFATE (2.5 MG/3ML) 0.083% IN NEBU: 2.5000 mg | INHALATION_SOLUTION | Freq: Once | RESPIRATORY_TRACT | Status: AC

## 2024-01-01 MED ORDER — TRIAMCINOLONE ACETONIDE 40 MG/ML IJ SUSP
40.0000 mg | Freq: Once | INTRAMUSCULAR | Status: AC
  Administered 2024-01-01: 40 mg via INTRAMUSCULAR

## 2024-01-01 MED ORDER — CEFTRIAXONE SODIUM 1 G IJ SOLR
1.0000 g | Freq: Once | INTRAMUSCULAR | Status: AC
  Administered 2024-01-01: 1 g via INTRAMUSCULAR

## 2024-01-01 NOTE — Progress Notes (Unsigned)
 I,Julie Obrien, CMA,acting as a Neurosurgeon for Smiley Dung, MD.,have documented all relevant documentation on the behalf of Smiley Dung, MD,as directed by  Smiley Dung, MD while in the presence of Smiley Dung, MD.  Subjective:  Patient ID: Julie Obrien , female    DOB: February 13, 1951 , 73 y.o.   MRN: [id number]  Chief Complaint  Patient presents with   Cough    Patient presents today for cough & congestion. Initially started on 5/15. She went to urgent care on 5/15. She was tested for flu & covid, pneumonia & bronchitis. Everything came back negative. While at the urgent care she was given a breathing treatment & Z pack. She completed Z pack yesterday. She admits also throwing up phlegm & headache along with chills. Yesterday she ran a low grade fever. She admits SOB & running a low grade fever since 5/15.       HPI Discussed the use of AI scribe software for clinical note transcription with the patient, who gave verbal consent to proceed.  History of Present Illness Julie Obrien is a 73 year old female with chronic bronchitis who presents with persistent symptoms of an upper respiratory infection.  She has persistent symptoms of an upper respiratory infection despite completing a course of azithromycin  from May 15 to Dec 31, 2023, with no improvement.  Her symptoms include coughing, chills, diarrhea, headaches, and a low-grade fever of around 29F. She experienced shortness of breath, which led her to seek urgent care, where a breathing treatment provided significant relief.  She has a history of chronic bronchitis and has not consulted a pulmonologist since 2021, missing a follow-up for further testing. She uses an inhaler but does not have a nebulizer at home.  She cannot tolerate Augmentin due to gastrointestinal side effects but can receive Rocephin  injections. Her oxygenation was 91% earlier today, according to her self-monitoring.  Her current medications include  Tessalon  Perles, which she started alongside the Z-Pak. She has not had a steroid shot in a long time.   Past Medical History:  Diagnosis Date   Allergy    Arthritis    hnp, low back , knees - " all OA"   Complication of anesthesia    nausea one surgery more recent   History of kidney stones    Pneumonia    Feb.- Mar. 2017, not hospitalized, but pending surgery was cancelled ,since then, pt. seen by PCP, told that it was cleared     PONV (postoperative nausea and vomiting)      Family History  Problem Relation Age of Onset   Dementia Mother    Cancer Mother    Arthritis Father    Cancer Sister    Cancer Maternal Aunt      Current Outpatient Medications:    acetaminophen  (TYLENOL ) 650 MG CR tablet, Take 650 mg by mouth daily. Takes three tablets a few times a week. Does not take it every day., Disp: , Rfl:    albuterol  (VENTOLIN  HFA) 108 (90 Base) MCG/ACT inhaler, Inhale 1-2 puffs into the lungs every 6 (six) hours as needed for wheezing or shortness of breath., Disp: 18 g, Rfl: 0   atorvastatin  (LIPITOR) 20 MG tablet, Take 1 tablet (20 mg total) by mouth daily., Disp: 90 tablet, Rfl: 2   pantoprazole  (PROTONIX ) 40 MG tablet, Take 1 tablet (40 mg total) by mouth daily as needed., Disp: 30 tablet, Rfl: 1   Vitamin D , Ergocalciferol , (DRISDOL ) 1.25 MG (  50000 UNIT) CAPS capsule, Take 1 capsule (50,000 Units total) by mouth 2 (two) times a week. Take on Tues & Fri, Disp: 8 capsule, Rfl: 1  Current Facility-Administered Medications:    albuterol  (PROVENTIL ) (2.5 MG/3ML) 0.083% nebulizer solution 2.5 mg, 2.5 mg, Nebulization, Once,    Allergies  Allergen Reactions   Nisoldipine Hives   Augmentin [Amoxicillin-Pot Clavulanate] Nausea And Vomiting    "Tore up my stomach" (verified this with her daughter, a physician).     Sulfa Antibiotics Hives     Review of Systems  Constitutional:  Positive for fatigue.  Respiratory:  Positive for cough.   Cardiovascular: Negative.    Gastrointestinal: Negative.   Neurological: Negative.   Psychiatric/Behavioral: Negative.       Today's Vitals   01/01/24 1604  BP: 122/74  Pulse: 85  Temp: 98.4 F (36.9 C)  SpO2: 98%  Weight: 185 lb 9.6 oz (84.2 kg)  Height: 5\' 2"  (1.575 m)   Body mass index is 33.95 kg/m.  Wt Readings from Last 3 Encounters:  01/01/24 185 lb 9.6 oz (84.2 kg)  10/29/23 193 lb 3.2 oz (87.6 kg)  06/07/23 194 lb 3.2 oz (88.1 kg)     Objective:  Physical Exam Vitals and nursing note reviewed.  Constitutional:      Appearance: Normal appearance. She is obese.  HENT:     Head: Normocephalic and atraumatic.  Eyes:     Extraocular Movements: Extraocular movements intact.  Cardiovascular:     Rate and Rhythm: Normal rate and regular rhythm.     Heart sounds: Normal heart sounds.  Pulmonary:     Effort: Pulmonary effort is normal.     Breath sounds: No wheezing.     Comments: Decreased breath sounds b/l Abdominal:     General: Bowel sounds are normal.     Palpations: Abdomen is soft.  Musculoskeletal:     Cervical back: Normal range of motion.  Skin:    General: Skin is warm.  Neurological:     General: No focal deficit present.     Mental Status: She is alert.  Psychiatric:        Mood and Affect: Mood normal.        Behavior: Behavior normal.      Assessment And Plan:  Acute bronchitis, unspecified organism Assessment & Plan: Persistent symptoms post-Z-Pak. Normal oxygenation, limited air movement. Rocephin  chosen due to Augmentin intolerance. - Administer Rocephin  injection, note potential burning sensation. - Administer steroid injection for airway opening. - Provide nebulizer for home use, instructed on how to use nebulizer - Supply medication for weekend coverage. - Request follow-up message next Tuesday.  Orders: -     cefTRIAXone  Sodium -     Triamcinolone  Acetonide -     Albuterol  Sulfate  Diarrhea, unspecified type Assessment & Plan: Diarrhea likely from  Z-Pak, symptoms may persist for five days post-completion. - Monitor symptoms for five days post-Z-Pak completion.     Return if symptoms worsen or fail to improve.  Patient was given opportunity to ask questions. Patient verbalized understanding of the plan and was able to repeat key elements of the plan. All questions were answered to their satisfaction.   I, Smiley Dung, MD, have reviewed all documentation for this visit. The documentation on 01/01/24 for the exam, diagnosis, procedures, and orders are all accurate and complete.   IF YOU HAVE BEEN REFERRED TO A SPECIALIST, IT MAY TAKE 1-2 WEEKS TO SCHEDULE/PROCESS THE REFERRAL. IF YOU HAVE NOT HEARD  FROM US /SPECIALIST IN TWO WEEKS, PLEASE GIVE US  A CALL AT [phone].   THE PATIENT IS ENCOURAGED TO PRACTICE SOCIAL DISTANCING DUE TO THE COVID-19 PANDEMIC.

## 2024-01-01 NOTE — Patient Instructions (Signed)

## 2024-01-04 ENCOUNTER — Encounter: Payer: Self-pay | Admitting: Internal Medicine

## 2024-01-06 DIAGNOSIS — J209 Acute bronchitis, unspecified: Secondary | ICD-10-CM | POA: Insufficient documentation

## 2024-01-06 DIAGNOSIS — R197 Diarrhea, unspecified: Secondary | ICD-10-CM | POA: Insufficient documentation

## 2024-01-06 NOTE — Assessment & Plan Note (Signed)
 Persistent symptoms post-Z-Pak. Normal oxygenation, limited air movement. Rocephin  chosen due to Augmentin intolerance. - Administer Rocephin  injection, note potential burning sensation. - Administer steroid injection for airway opening. - Provide nebulizer for home use, instructed on how to use nebulizer - Supply medication for weekend coverage. - Request follow-up message next Tuesday.

## 2024-01-06 NOTE — Assessment & Plan Note (Signed)
 Diarrhea likely from Z-Pak, symptoms may persist for five days post-completion. - Monitor symptoms for five days post-Z-Pak completion.

## 2024-01-09 ENCOUNTER — Other Ambulatory Visit: Payer: Self-pay | Admitting: Internal Medicine

## 2024-01-09 DIAGNOSIS — I2510 Atherosclerotic heart disease of native coronary artery without angina pectoris: Secondary | ICD-10-CM

## 2024-01-09 DIAGNOSIS — R0602 Shortness of breath: Secondary | ICD-10-CM

## 2024-01-24 ENCOUNTER — Other Ambulatory Visit: Payer: Self-pay | Admitting: Internal Medicine

## 2024-01-29 ENCOUNTER — Ambulatory Visit: Admitting: Internal Medicine

## 2024-01-29 DIAGNOSIS — S8392XA Sprain of unspecified site of left knee, initial encounter: Secondary | ICD-10-CM | POA: Diagnosis not present

## 2024-02-13 ENCOUNTER — Ambulatory Visit: Admitting: Internal Medicine

## 2024-02-13 ENCOUNTER — Encounter: Payer: Self-pay | Admitting: Internal Medicine

## 2024-02-13 VITALS — BP 110/80 | HR 77 | Temp 98.8°F | Ht 62.0 in | Wt 186.0 lb

## 2024-02-13 DIAGNOSIS — E66811 Obesity, class 1: Secondary | ICD-10-CM

## 2024-02-13 DIAGNOSIS — E7800 Pure hypercholesterolemia, unspecified: Secondary | ICD-10-CM

## 2024-02-13 DIAGNOSIS — E2839 Other primary ovarian failure: Secondary | ICD-10-CM

## 2024-02-13 DIAGNOSIS — R7303 Prediabetes: Secondary | ICD-10-CM | POA: Diagnosis not present

## 2024-02-13 DIAGNOSIS — F17200 Nicotine dependence, unspecified, uncomplicated: Secondary | ICD-10-CM

## 2024-02-13 DIAGNOSIS — Z6834 Body mass index (BMI) 34.0-34.9, adult: Secondary | ICD-10-CM

## 2024-02-13 DIAGNOSIS — E6609 Other obesity due to excess calories: Secondary | ICD-10-CM

## 2024-02-13 MED ORDER — PANTOPRAZOLE SODIUM 40 MG PO TBEC: 40.0000 mg | DELAYED_RELEASE_TABLET | Freq: Every day | ORAL | 1 refills | Status: DC | PRN

## 2024-02-13 NOTE — Progress Notes (Signed)
 I,Victoria T Emmitt, CMA,acting as a Neurosurgeon for Catheryn LOISE Slocumb, MD.,have documented all relevant documentation on the behalf of Catheryn LOISE Slocumb, MD,as directed by  Catheryn LOISE Slocumb, MD while in the presence of Catheryn LOISE Slocumb, MD.  Subjective:  Patient ID: Julie Obrien , female    DOB: 23-Mar-1951 , 73 y.o.   MRN: [id number]  Chief Complaint  Patient presents with   Prediabetes    Patient presents today for prediabetes follow up. She does not take any prescribed medications for this. She has no specific questions or concerns.     HPI Discussed the use of AI scribe software for clinical note transcription with the patient, who gave verbal consent to proceed.  History of Present Illness Julie Obrien is a 72 year old female who presents for a prediabetes test and routine follow-up.  She is currently undergoing testing for prediabetes and is closely monitoring her condition. She takes atorvastatin  40 mg for cholesterol management and pantoprazole  daily for gastroesophageal reflux disease (GERD). She is making efforts to improve her hydration by drinking [employer] water.  Her respiratory symptoms have resolved significantly, and she no longer experiences breathing difficulties. She previously used a nebulizer machine to aid her breathing, but no current respiratory issues are reported.  She is due for a bone density test in August and has not yet scheduled her mammogram. Her last bone density test was in late August 2023, and she plans to schedule the next one for September 2025. She typically goes to [facility] for her mammogram.  She wants peace and enjoys staying at home, having given away her suitcases and preferring not to travel anymore.  Past Medical History:  Diagnosis Date   Allergy    Arthritis    hnp, low back , knees -  all OA   Complication of anesthesia    nausea one surgery more recent   History of kidney stones    Pneumonia    Feb.- Mar. 2017, not hospitalized, but  pending surgery was cancelled ,since then, pt. seen by PCP, told that it was cleared     PONV (postoperative nausea and vomiting)      Family History  Problem Relation Age of Onset   Dementia Mother    Cancer Mother    Arthritis Father    Cancer Sister    Cancer Maternal Aunt      Current Outpatient Medications:    acetaminophen  (TYLENOL ) 650 MG CR tablet, Take 650 mg by mouth daily. Takes three tablets a few times a week. Does not take it every day., Disp: , Rfl:    albuterol  (VENTOLIN  HFA) 108 (90 Base) MCG/ACT inhaler, Inhale 1-2 puffs into the lungs every 6 (six) hours as needed for wheezing or shortness of breath., Disp: 18 g, Rfl: 0   atorvastatin  (LIPITOR) 20 MG tablet, Take 1 tablet (20 mg total) by mouth daily., Disp: 90 tablet, Rfl: 2   Vitamin D , Ergocalciferol , (DRISDOL ) 1.25 MG (50000 UNIT) CAPS capsule, Take 1 capsule (50,000 Units total) by mouth 2 (two) times a week. Take on Tues & Fri, Disp: 8 capsule, Rfl: 1   pantoprazole  (PROTONIX ) 40 MG tablet, Take 1 tablet (40 mg total) by mouth daily as needed., Disp: 90 tablet, Rfl: 1  Current Facility-Administered Medications:    albuterol  (PROVENTIL ) (2.5 MG/3ML) 0.083% nebulizer solution 2.5 mg, 2.5 mg, Nebulization, Once,    Allergies  Allergen Reactions   Nisoldipine Hives   Augmentin [Amoxicillin-Pot Clavulanate] Nausea And Vomiting  Tore up my stomach (verified this with her daughter, a physician).     Sulfa Antibiotics Hives     Review of Systems  Constitutional: Negative.   Respiratory: Negative.    Cardiovascular: Negative.   Gastrointestinal: Negative.   Neurological: Negative.   Psychiatric/Behavioral: Negative.       Today's Vitals   02/13/24 1140  BP: 110/80  Pulse: 77  Temp: 98.8 F (37.1 C)  SpO2: 98%  Weight: 186 lb (84.4 kg)  Height: 5' 2 (1.575 m)   Body mass index is 34.02 kg/m.  Wt Readings from Last 3 Encounters:  02/13/24 186 lb (84.4 kg)  01/01/24 185 lb 9.6 oz (84.2 kg)   10/29/23 193 lb 3.2 oz (87.6 kg)    The 10-year ASCVD risk score (Arnett DK, et al., 2019) is: 19.4%   Values used to calculate the score:     Age: 39 years     Clincally relevant sex: Female     Is Non-Hispanic African American: Yes     Diabetic: No     Tobacco smoker: Yes     Systolic Blood Pressure: 110 mmHg     Is BP treated: No     HDL Cholesterol: 71 mg/dL     Total Cholesterol: 164 mg/dL  Objective:  Physical Exam Vitals and nursing note reviewed.  Constitutional:      Appearance: Normal appearance.  HENT:     Head: Normocephalic and atraumatic.  Eyes:     Extraocular Movements: Extraocular movements intact.  Cardiovascular:     Rate and Rhythm: Normal rate and regular rhythm.     Heart sounds: Normal heart sounds.  Pulmonary:     Effort: Pulmonary effort is normal.     Breath sounds: Normal breath sounds.  Musculoskeletal:     Cervical back: Normal range of motion.  Skin:    General: Skin is warm.  Neurological:     General: No focal deficit present.     Mental Status: She is alert.  Psychiatric:        Mood and Affect: Mood normal.        Behavior: Behavior normal.         Assessment And Plan:  Prediabetes Assessment & Plan: Previous labs reviewed, her A1c has been elevated in the past. I will check an A1c today. Reminded to avoid refined sugars including sugary drinks/foods and processed meats including bacon, sausages and deli meats.    Orders: -     CBC -     Hemoglobin A1c -     BMP8+EGFR  Pure hypercholesterolemia Assessment & Plan: Chronic, she has been prescribed atorvastatin  20mg  daily. Encouraged to take daily as prescribed.  Importance of medication/dietary compliance was discussed with the patient.   Orders: -     Lipid panel  Tobacco use disorder Assessment & Plan: Smoking cessation instruction/counseling given:  counseled patient on the dangers of tobacco use, advised patient to stop smoking, and reviewed strategies to maximize  success   Orders: -     CT CHEST LUNG CANCER SCREENING LOW DOSE WO CONTRAST; Future  Estrogen deficiency -     DG Bone Density; Future  Class 1 obesity due to excess calories with serious comorbidity and body mass index (BMI) of 34.0 to 34.9 in adult Assessment & Plan: She is encouraged to strive for BMI less than 30 to decrease cardiac risk. Advised to aim for at least 150 minutes of exercise per week.    Other orders -  Pantoprazole  Sodium; Take 1 tablet (40 mg total) by mouth daily as needed.  Dispense: 90 tablet; Refill: 1  Return if symptoms worsen or fail to improve.  Patient was given opportunity to ask questions. Patient verbalized understanding of the plan and was able to repeat key elements of the plan. All questions were answered to their satisfaction.    I, Catheryn LOISE Slocumb, MD, have reviewed all documentation for this visit. The documentation on 02/13/24 for the exam, diagnosis, procedures, and orders are all accurate and complete.  IF YOU HAVE BEEN REFERRED TO A SPECIALIST, IT MAY TAKE 1-2 WEEKS TO SCHEDULE/PROCESS THE REFERRAL. IF YOU HAVE NOT HEARD FROM US /SPECIALIST IN TWO WEEKS, PLEASE GIVE US  A CALL AT [phone].   THE PATIENT IS ENCOURAGED TO PRACTICE SOCIAL DISTANCING DUE TO THE COVID-19 PANDEMIC.

## 2024-02-13 NOTE — Patient Instructions (Signed)
 Prediabetes: What to Know Prediabetes is when your blood sugar, also called glucose, is at a higher level than normal but not high enough for you to be diagnosed with type 2 diabetes (type 2 diabetes mellitus). Having prediabetes puts you at risk for getting type 2 diabetes. By making some healthy changes, you may be able to prevent or delay getting type 2 diabetes. This is important because type 2 diabetes can lead to serious problems. Some of these include: Heart disease. Stroke. Blindness. Kidney disease. Depression. Poor blood flow in the feet and legs. In very bad cases, this could lead to having a leg removed by surgery (amputation). What are the causes? The exact cause of prediabetes isn't known. It may result from insulin resistance. Insulin resistance happens when cells in the body don't respond properly to insulin that the body makes. This can cause too much sugar to build up in the blood. High blood sugar, also called hyperglycemia, can develop. What increases the risk? Having a family member with type 2 diabetes. Being older than 74 years of age. Having had a temporary form of diabetes during a pregnancy. This is called gestational diabetes. Having had polycystic ovary syndrome (PCOS). Being overweight or obese. Being inactive and not getting much exercise. Having a history of heart disease. This may include problems with cholesterol levels, high levels of blood fats, or high blood pressure. What are the signs or symptoms? You may have no symptoms. If you do have symptoms, they may include: Increased hunger. Increased thirst. Needing to pee more often. Changes in how you see, like blurry vision. Feeling tired. How is this diagnosed? Prediabetes can be diagnosed with blood tests that check your blood sugar. One or more of these tests may be done: A fasting blood glucose (FBG) test. You won't be allowed to eat (you will fast) for at least 8 hours before a blood sample is  taken. An A1C blood test, also called a hemoglobin A1C test. This test shows information about blood sugar levels over the past 2?3 months. An oral glucose tolerance test (OGTT). This test measures your blood sugar at two points in time: After you haven't eaten for a while. This is your baseline level. Two hours after you drink a beverage that has sugar in it. You may be diagnosed with prediabetes if: Your FBG is 100?125 mg/dL (1.6-1.0 mmol/L). Your A1C level is 5.7?6.4% (39-46 mmol/mol). Your OGTT result is 140?199 mg/dL (9.6-04 mmol/L). These blood tests may need to be done again to be sure of the diagnosis. How is this treated? Treatment may include making changes to your diet and lifestyle. These changes can help lower your blood sugar and keep you from getting type 2 diabetes. In some cases, medicine may be given to help lower your risk. Follow these instructions at home: Eating and drinking  Eat and drink as told. Follow a healthy meal plan. This includes eating lean proteins, whole grains, legumes, fresh fruits and vegetables, low-fat dairy products, and healthy fats. Meet with an expert in healthy eating called a dietitian. This person can help create a healthy eating plan that's right for you. Lifestyle Do moderate-intensity exercise. Do this for at least 30 minutes a day on 5 or more days each week, or as told by your health care provider. A mix of activities may be best. Good choices include brisk walking, swimming, biking, and weight lifting. Try to lose weight if your provider says it's OK. Losing 5-7% of your body weight can  help reverse insulin resistance. Do not drink alcohol if: Your provider tells you not to drink. You're pregnant, may be pregnant, or plan to become pregnant. If you drink alcohol: Limit how much you have to: 0-1 drink a day if you're female. 0-2 drinks a day if you're female. Know how much alcohol is in your drink. In the [place] one drink is one 12 oz  bottle of beer (355 mL), one 5 oz glass of wine (148 mL), or one 1 oz glass of hard liquor (44 mL). General instructions Take medicines only as told. You may be given medicines that help lower the risk of type 2 diabetes. Do not smoke, vape, or use nicotine or tobacco. Where to find more information American Diabetes Association: diabetes.org/about-diabetes/prediabetes Academy of Nutrition and Dietetics: eatright.org American Heart Association: Go to ThisJobs.cz. Click the search icon. Type "prediabetes" in the search box. Contact a health care provider if: You have any of these symptoms: Increased hunger. Peeing more often than usual. Increased thirst. Feeling tired. Changes in how you see, like blurry vision. Feeling like you may throw up. Throwing up. Get help right away if: You have shortness of breath. You feel confused. This information is not intended to replace advice given to you by your health care provider. Make sure you discuss any questions you have with your health care provider. Document Revised: 03/04/2023 Document Reviewed: 03/04/2023 Elsevier Patient Education  2024 [employer].

## 2024-02-14 LAB — LIPID PANEL
Chol/HDL Ratio: 2.3 ratio (ref 0.0–4.4)
Cholesterol, Total: 164 mg/dL (ref 100–199)
HDL: 71 mg/dL (ref >39)
LDL Chol Calc (NIH): 82 mg/dL (ref 0–99)
Triglycerides: 56 mg/dL (ref 0–149)
VLDL Cholesterol Cal: 11 mg/dL (ref 5–40)

## 2024-02-14 LAB — BMP8+EGFR
BUN/Creatinine Ratio: 11 — ABNORMAL LOW (ref 12–28)
BUN: 10 mg/dL (ref 8–27)
CO2: 23 mmol/L (ref 20–29)
Calcium: 9.4 mg/dL (ref 8.7–10.3)
Chloride: 102 mmol/L (ref 96–106)
Creatinine, Ser: 0.87 mg/dL (ref 0.57–1.00)
Glucose: 100 mg/dL — ABNORMAL HIGH (ref 70–99)
Potassium: 5.3 mmol/L — ABNORMAL HIGH (ref 3.5–5.2)
Sodium: 142 mmol/L (ref 134–144)
eGFR: 70 mL/min/{1.73_m2} (ref >59)

## 2024-02-14 LAB — CBC
Hematocrit: 41.5 % (ref 34.0–46.6)
Hemoglobin: 12.9 g/dL (ref 11.1–15.9)
MCH: 27.3 pg (ref 26.6–33.0)
MCHC: 31.1 g/dL — ABNORMAL LOW (ref 31.5–35.7)
MCV: 88 fL (ref 79–97)
Platelets: 327 10*3/uL (ref 150–450)
RBC: 4.72 x10E6/uL (ref 3.77–5.28)
RDW: 13.1 % (ref 11.7–15.4)
WBC: 8.3 10*3/uL (ref 3.4–10.8)

## 2024-02-14 LAB — HEMOGLOBIN A1C
Est. average glucose Bld gHb Est-mCnc: 123 mg/dL
Hgb A1c MFr Bld: 5.9 % — ABNORMAL HIGH (ref 4.8–5.6)

## 2024-02-16 ENCOUNTER — Ambulatory Visit: Payer: Self-pay | Admitting: Internal Medicine

## 2024-02-17 DIAGNOSIS — E66811 Obesity, class 1: Secondary | ICD-10-CM | POA: Insufficient documentation

## 2024-02-17 NOTE — Assessment & Plan Note (Signed)
 She is encouraged to strive for BMI less than 30 to decrease cardiac risk. Advised to aim for at least 150 minutes of exercise per week.

## 2024-02-17 NOTE — Assessment & Plan Note (Signed)
 Chronic, she has been prescribed atorvastatin  20mg  daily. Encouraged to take daily as prescribed.  Importance of medication/dietary compliance was discussed with the patient.

## 2024-02-17 NOTE — Assessment & Plan Note (Signed)
 Smoking cessation instruction/counseling given:  counseled patient on the dangers of tobacco use, advised patient to stop smoking, and reviewed strategies to maximize success

## 2024-02-17 NOTE — Assessment & Plan Note (Signed)
 Previous labs reviewed, her A1c has been elevated in the past. I will check an A1c today. Reminded to avoid refined sugars including sugary drinks/foods and processed meats including bacon, sausages and deli meats.

## 2024-04-02 DIAGNOSIS — J410 Simple chronic bronchitis: Secondary | ICD-10-CM | POA: Diagnosis not present

## 2024-04-17 ENCOUNTER — Inpatient Hospital Stay
Admission: RE | Admit: 2024-04-17 | Discharge: 2024-04-17 | Disposition: A | Discharge status: Home / Self Care | Source: Ambulatory Visit | Attending: Internal Medicine | Admitting: Internal Medicine

## 2024-04-17 DIAGNOSIS — F17210 Nicotine dependence, cigarettes, uncomplicated: Secondary | ICD-10-CM | POA: Diagnosis not present

## 2024-04-17 DIAGNOSIS — F17200 Nicotine dependence, unspecified, uncomplicated: Secondary | ICD-10-CM

## 2024-04-18 DIAGNOSIS — Z1231 Encounter for screening mammogram for malignant neoplasm of breast: Secondary | ICD-10-CM | POA: Diagnosis not present

## 2024-04-18 DIAGNOSIS — M85851 Other specified disorders of bone density and structure, right thigh: Secondary | ICD-10-CM | POA: Diagnosis not present

## 2024-04-18 LAB — HM MAMMOGRAPHY

## 2024-04-18 LAB — HM DEXA SCAN

## 2024-04-22 ENCOUNTER — Ambulatory Visit: Payer: Self-pay | Admitting: Internal Medicine

## 2024-05-04 DIAGNOSIS — I672 Cerebral atherosclerosis: Secondary | ICD-10-CM | POA: Diagnosis not present

## 2024-05-04 DIAGNOSIS — R918 Other nonspecific abnormal finding of lung field: Secondary | ICD-10-CM | POA: Diagnosis not present

## 2024-05-04 DIAGNOSIS — I6523 Occlusion and stenosis of bilateral carotid arteries: Secondary | ICD-10-CM | POA: Diagnosis not present

## 2024-05-04 DIAGNOSIS — R4182 Altered mental status, unspecified: Secondary | ICD-10-CM | POA: Diagnosis not present

## 2024-05-04 DIAGNOSIS — J984 Other disorders of lung: Secondary | ICD-10-CM | POA: Diagnosis not present

## 2024-05-04 DIAGNOSIS — J9811 Atelectasis: Secondary | ICD-10-CM | POA: Diagnosis not present

## 2024-05-04 DIAGNOSIS — I639 Cerebral infarction, unspecified: Secondary | ICD-10-CM | POA: Diagnosis not present

## 2024-05-05 DIAGNOSIS — R200 Anesthesia of skin: Secondary | ICD-10-CM | POA: Diagnosis not present

## 2024-05-05 DIAGNOSIS — G459 Transient cerebral ischemic attack, unspecified: Secondary | ICD-10-CM | POA: Diagnosis not present

## 2024-05-05 DIAGNOSIS — R253 Fasciculation: Secondary | ICD-10-CM | POA: Diagnosis not present

## 2024-05-05 DIAGNOSIS — G5761 Lesion of plantar nerve, right lower limb: Secondary | ICD-10-CM | POA: Diagnosis not present

## 2024-05-05 DIAGNOSIS — R519 Headache, unspecified: Secondary | ICD-10-CM | POA: Diagnosis not present

## 2024-05-06 ENCOUNTER — Telehealth: Payer: Self-pay

## 2024-05-06 NOTE — Transitions of Care (Post Inpatient/ED Visit) (Signed)
   05/06/2024  Name: Julie Obrien MRN: [id number] DOB: Apr 08, 1951  Today's TOC FU Call Status: Today's TOC FU Call Status:: Successful TOC FU Call Completed TOC FU Call Complete Date: 05/06/24 Patient's Name and Date of Birth confirmed.  Transition Care Management Follow-up Telephone Call Date of Discharge: 05/05/24 Discharge Facility: Other [facility] Type of Discharge: Inpatient Admission Primary Inpatient Discharge Diagnosis:: TIA How have you been since you were released from the hospital?: Better Any questions or concerns?: No  Items Reviewed: Did you receive and understand the discharge instructions provided?: Yes Medications obtained,verified, and reconciled?: Yes (Medications Reviewed) Any new allergies since your discharge?: No Dietary orders reviewed?: Yes Do you have support at home?: Yes People in Home [RPT]: child(ren), adult  Medications Reviewed Today: Medications Reviewed Today     Reviewed by Emmitt Pan, LPN (Licensed Practical Nurse) on 05/06/24 at 1555  Med List Status: <None>   Medication Order Taking? Sig Documenting Provider Last Dose Status Informant  acetaminophen  (TYLENOL ) 650 MG CR tablet [id number] Yes Take 650 mg by mouth daily. Takes three tablets a few times a week. Does not take it every day. [provider]  Active Self  albuterol  (PROVENTIL ) (2.5 MG/3ML) 0.083% nebulizer solution 2.5 mg [id number]   Jarold Medici, MD  Active   albuterol  (VENTOLIN  HFA) 108 [phone] Base) MCG/ACT inhaler [id number] Yes Inhale 1-2 puffs into the lungs every 6 (six) hours as needed for wheezing or shortness of breath. Melonie Locus, PA-C  Active   atorvastatin  (LIPITOR) 20 MG tablet [id number] Yes Take 1 tablet (20 mg total) by mouth daily. Jarold Medici, MD  Active   pantoprazole  (PROTONIX ) 40 MG tablet [id number] Yes Take 1 tablet (40 mg total) by mouth daily as needed. Jarold Medici, MD   Active   Vitamin D , Ergocalciferol , (DRISDOL ) 1.25 MG (50000 UNIT) CAPS capsule [id number] Yes Take 1 capsule (50,000 Units total) by mouth 2 (two) times a week. Take on Tues & [place] Jarold Medici, MD  Active             Home Care and Equipment/Supplies: Were Home Health Services Ordered?: NA Any new equipment or medical supplies ordered?: NA  Functional Questionnaire: Do you need assistance with bathing/showering or dressing?: No Do you need assistance with meal preparation?: No Do you need assistance with eating?: No Do you have difficulty maintaining continence: No Do you need assistance with getting out of bed/getting out of a chair/moving?: No Do you have difficulty managing or taking your medications?: No  Follow up appointments reviewed: PCP Follow-up appointment confirmed?: No (no avail appt, sent message to staff) Specialist Hospital Follow-up appointment confirmed?: NA Do you need transportation to your follow-up appointment?: No Do you understand care options if your condition(s) worsen?: Yes-patient verbalized understanding    SIGNATURE Pan Emmitt, LPN [facility] Nurse Health Advisor Direct Dial [phone]

## 2024-05-08 ENCOUNTER — Encounter: Payer: Self-pay | Admitting: Internal Medicine

## 2024-05-12 ENCOUNTER — Ambulatory Visit (INDEPENDENT_AMBULATORY_CARE_PROVIDER_SITE_OTHER): Admitting: Nurse Practitioner

## 2024-05-12 ENCOUNTER — Encounter: Payer: Self-pay | Admitting: Nurse Practitioner

## 2024-05-12 VITALS — BP 100/60 | HR 70 | Temp 98.6°F | Ht 62.0 in | Wt 186.0 lb

## 2024-05-12 DIAGNOSIS — R200 Anesthesia of skin: Secondary | ICD-10-CM | POA: Insufficient documentation

## 2024-05-12 DIAGNOSIS — Z6834 Body mass index (BMI) 34.0-34.9, adult: Secondary | ICD-10-CM | POA: Diagnosis not present

## 2024-05-12 DIAGNOSIS — E66811 Obesity, class 1: Secondary | ICD-10-CM | POA: Diagnosis not present

## 2024-05-12 DIAGNOSIS — M79604 Pain in right leg: Secondary | ICD-10-CM | POA: Insufficient documentation

## 2024-05-12 DIAGNOSIS — E6609 Other obesity due to excess calories: Secondary | ICD-10-CM

## 2024-05-12 DIAGNOSIS — G459 Transient cerebral ischemic attack, unspecified: Secondary | ICD-10-CM | POA: Diagnosis not present

## 2024-05-12 DIAGNOSIS — Z2821 Immunization not carried out because of patient refusal: Secondary | ICD-10-CM | POA: Diagnosis not present

## 2024-05-12 DIAGNOSIS — R202 Paresthesia of skin: Secondary | ICD-10-CM | POA: Diagnosis not present

## 2024-05-12 DIAGNOSIS — R930 Abnormal findings on diagnostic imaging of skull and head, not elsewhere classified: Secondary | ICD-10-CM | POA: Insufficient documentation

## 2024-05-12 NOTE — Assessment & Plan Note (Signed)
 Pain associated with varicose veins, no acute thrombosis or infection. Pain localized to varicose veins, possibly due to irritation. Negative for any swelling but does have a slightly raised area in circular area - Advise wearing compression socks during the day to alleviate symptoms.

## 2024-05-12 NOTE — Assessment & Plan Note (Addendum)
 Seen in [facility] ER for possible TIA with involuntary twitching in the right arm and headache. MRI suggests possible encephalopathy or punctate foci, but motion artifacts may have affected results. Differential includes TIA or stroke. No residual symptoms or irregular heart rhythm. Sinus bradycardia on EKG at the ER. - Consult with Dr. Jarold to review MRI results and determine next steps. - Consider repeat MRI if necessary, ensuring proper neck support during the procedure. - We will contact her by tomorrow afternoon with updates after consulting with Dr. Jarold. - Ensure communication regarding any changes in the plan or need for further testing.

## 2024-05-12 NOTE — Assessment & Plan Note (Signed)
 She is encouraged to strive for BMI less than 30 to decrease cardiac risk. Advised to aim for at least 150 minutes of exercise per week.

## 2024-05-12 NOTE — Progress Notes (Unsigned)
 I,Jameka J Llittleton, CMA,acting as a Neurosurgeon for [employer], FNP.,have documented all relevant documentation on the behalf of Gaines Ada, FNP,as directed by  Gaines Ada, FNP while in the presence of Gaines Ada, FNP.  Subjective:  Patient ID: Julie Obrien , female    DOB: 1951-01-19 , 73 y.o.   MRN: [id number]  Chief Complaint  Patient presents with   MRI results   Leg Pain    Patient complains of soreness on her right leg. Patient reports the pain has been there for the past week.    Discussed the use of AI scribe software for clinical note transcription with the patient, who gave verbal consent to proceed.  History of Present Illness Julie Obrien is a 73 year old female who presents for follow-up on an abnormal MRI.  She experienced involuntary twitching in her right arm, a frontal headache, and tingling in her left foot around the ankle, which prompted a visit to the ER in [place]. The tingling is intermittent and not new, but the headache was unusual for her. No nausea, vomiting, lightheadedness, dizziness, chest pain, shortness of breath, palpitations, blurred vision, or double vision. She felt weak and generally unwell during the episode.  She was admitted to the hospital overnight as the MRI could not be performed on a Sunday night. The MRI was completed the following day.  She reports pain in her leg, particularly around a varicose vein, which has been present for about a week. The pain is described as circular and tender. She does not wear compression socks.  She traveled to [place] by car recently. During the MRI, she had difficulty staying still due to the lack of a proper wedge for her neck, which was challenging because of previous neck surgery. She has hardware in her neck and back and confirmed with her neurosurgeon's office that it was safe to have an MRI.  She is currently on atorvastatin  for elevated cholesterol.  Dr. Arley Helling Neurosurgeon she has had an  MRI.      Past Medical History:  Diagnosis Date   Allergy    Arthritis    hnp, low back , knees -  all OA   Complication of anesthesia    nausea one surgery more recent   History of kidney stones    Pneumonia    Feb.- Mar. 2017, not hospitalized, but pending surgery was cancelled ,since then, pt. seen by PCP, told that it was cleared     PONV (postoperative nausea and vomiting)      Family History  Problem Relation Age of Onset   Dementia Mother    Cancer Mother    Arthritis Father    Cancer Sister    Cancer Maternal Aunt      Current Outpatient Medications:    acetaminophen  (TYLENOL ) 650 MG CR tablet, Take 650 mg by mouth daily. Takes three tablets a few times a week. Does not take it every day., Disp: , Rfl:    albuterol  (VENTOLIN  HFA) 108 (90 Base) MCG/ACT inhaler, Inhale 1-2 puffs into the lungs every 6 (six) hours as needed for wheezing or shortness of breath., Disp: 18 g, Rfl: 0   atorvastatin  (LIPITOR) 20 MG tablet, Take 1 tablet (20 mg total) by mouth daily., Disp: 90 tablet, Rfl: 2   clindamycin (CLEOCIN) 300 MG capsule, Take 300 mg by mouth every 6 (six) hours., Disp: , Rfl:    pantoprazole  (PROTONIX ) 40 MG tablet, Take 1 tablet (40 mg total) by mouth  daily as needed., Disp: 90 tablet, Rfl: 1   Vitamin D , Ergocalciferol , (DRISDOL ) 1.25 MG (50000 UNIT) CAPS capsule, Take 1 capsule (50,000 Units total) by mouth 2 (two) times a week. Take on Tues & Fri, Disp: 8 capsule, Rfl: 1  Current Facility-Administered Medications:    albuterol  (PROVENTIL ) (2.5 MG/3ML) 0.083% nebulizer solution 2.5 mg, 2.5 mg, Nebulization, Once,    Allergies  Allergen Reactions   Nisoldipine Hives   Augmentin [Amoxicillin-Pot Clavulanate] Nausea And Vomiting    Tore up my stomach (verified this with her daughter, a physician).     Sulfa Antibiotics Hives     Review of Systems  Constitutional: Negative.  Negative for activity change and fatigue.  Eyes:  Negative for visual disturbance.   Respiratory: Negative.  Negative for choking, shortness of breath and wheezing.   Cardiovascular: Negative.  Negative for chest pain, palpitations and leg swelling.  Gastrointestinal: Negative.   Endocrine: Negative.  Negative for polydipsia, polyphagia and polyuria.  Musculoskeletal: Negative.   Skin: Negative.   Neurological:  Negative for dizziness, weakness and headaches.  Psychiatric/Behavioral:  Negative for confusion. The patient is not nervous/anxious.      Today's Vitals   05/12/24 0822  BP: 100/60  Pulse: 70  Temp: 98.6 F (37 C)  TempSrc: Oral  Weight: 186 lb (84.4 kg)  Height: 5' 2 (1.575 m)  PainSc: 0-No pain   Body mass index is 34.02 kg/m.  Wt Readings from Last 3 Encounters:  05/12/24 186 lb (84.4 kg)  02/13/24 186 lb (84.4 kg)  01/01/24 185 lb 9.6 oz (84.2 kg)     Objective:  Physical Exam Vitals and nursing note reviewed.  Constitutional:      Appearance: She is well-developed.  HENT:     Head: Normocephalic and atraumatic.  Eyes:     Pupils: Pupils are equal, round, and reactive to light.  Cardiovascular:     Rate and Rhythm: Normal rate and regular rhythm.     Pulses: Normal pulses.     Heart sounds: Normal heart sounds. No murmur heard. Pulmonary:     Effort: Pulmonary effort is normal. No respiratory distress.     Breath sounds: Normal breath sounds. No wheezing.  Musculoskeletal:        General: Normal range of motion.     Right lower leg: Tenderness (right lateral calf around varicose veins) present.  Skin:    General: Skin is warm and dry.     Capillary Refill: Capillary refill takes less than 2 seconds.  Neurological:     General: No focal deficit present.     Mental Status: She is alert and oriented to person, place, and time.     GCS: GCS eye subscore is 4. GCS verbal subscore is 5. GCS motor subscore is 6.     Cranial Nerves: Cranial nerves 2-12 are intact. No cranial nerve deficit.     Sensory: Sensation is intact. No sensory  deficit.     Motor: Motor function is intact.     Coordination: Coordination is intact.     Gait: Gait is intact.  Psychiatric:        Mood and Affect: Mood normal.      Assessment And Plan:  Pain of right lower extremity  Influenza vaccination declined  Class 1 obesity due to excess calories with serious comorbidity and body mass index (BMI) of 34.0 to 34.9 in adult  Numbness and tingling sensation of skin   Assessment and Plan Assessment & Plan Transient  ischemic attack (TIA), resolved Resolved TIA with involuntary twitching in the right arm and headache. MRI suggests possible encephalopathy or punctate foci, but motion artifacts may have affected results. Differential includes TIA or stroke. No residual symptoms or irregular heart rhythm. Sinus bradycardia on EKG. - Consult with Dr. Jarold and a neurologist to review MRI results and determine next steps. - Consider repeat MRI if necessary, ensuring proper neck support during the procedure.  Right lower extremity pain with varicose veins Pain associated with varicose veins, no acute thrombosis or infection. Pain localized to varicose veins, possibly due to irritation. - Advise wearing compression socks during the day to alleviate symptoms.  Hyperlipidemia Slightly elevated cholesterol levels.  Follow-up Follow-up needed to discuss MRI results and potential further testing with Dr. Jarold. - Contact her by tomorrow afternoon with updates after consulting with Dr. Jarold. - Ensure communication regarding any changes in the plan or need for further testing.    Return if symptoms worsen or fail to improve.  Patient was given opportunity to ask questions. Patient verbalized understanding of the plan and was able to repeat key elements of the plan. All questions were answered to their satisfaction.    LILLETTE Gaines Ada, FNP, have reviewed all documentation for this visit. The documentation on 05/12/24 for the exam, diagnosis,  procedures, and orders are all accurate and complete.   IF YOU HAVE BEEN REFERRED TO A SPECIALIST, IT MAY TAKE 1-2 WEEKS TO SCHEDULE/PROCESS THE REFERRAL. IF YOU HAVE NOT HEARD FROM US /SPECIALIST IN TWO WEEKS, PLEASE GIVE US  A CALL AT [phone].

## 2024-05-13 ENCOUNTER — Ambulatory Visit: Attending: Nurse Practitioner

## 2024-05-13 DIAGNOSIS — Z008 Encounter for other general examination: Secondary | ICD-10-CM | POA: Diagnosis not present

## 2024-05-13 DIAGNOSIS — G459 Transient cerebral ischemic attack, unspecified: Secondary | ICD-10-CM

## 2024-05-13 NOTE — Progress Notes (Unsigned)
 EP to read.

## 2024-05-20 ENCOUNTER — Encounter: Payer: Self-pay | Admitting: Nurse Practitioner

## 2024-05-30 ENCOUNTER — Ambulatory Visit: Payer: Self-pay

## 2024-06-02 DIAGNOSIS — J410 Simple chronic bronchitis: Secondary | ICD-10-CM | POA: Diagnosis not present

## 2024-06-17 DIAGNOSIS — G459 Transient cerebral ischemic attack, unspecified: Secondary | ICD-10-CM | POA: Diagnosis not present

## 2024-06-19 DIAGNOSIS — G459 Transient cerebral ischemic attack, unspecified: Secondary | ICD-10-CM

## 2024-06-29 ENCOUNTER — Other Ambulatory Visit: Payer: Self-pay | Admitting: Internal Medicine

## 2024-07-03 DIAGNOSIS — J410 Simple chronic bronchitis: Secondary | ICD-10-CM | POA: Diagnosis not present

## 2024-08-02 DIAGNOSIS — J410 Simple chronic bronchitis: Secondary | ICD-10-CM | POA: Diagnosis not present

## 2024-08-05 ENCOUNTER — Ambulatory Visit: Payer: Self-pay

## 2024-08-05 DIAGNOSIS — Z0000 Encounter for general adult medical examination without abnormal findings: Secondary | ICD-10-CM

## 2024-08-05 NOTE — Progress Notes (Signed)
 "  Chief Complaint  Patient presents with   Medicare Wellness     Subjective:   Julie Obrien is a 73 y.o. female who presents for a Medicare Annual Wellness Visit.  Visit info / Clinical Intake: Medicare Wellness Visit Type:: Subsequent Annual Wellness Visit Persons participating in visit and providing information:: patient Medicare Wellness Visit Mode:: Telephone If telephone:: video declined Since this visit was completed virtually, some vitals may be partially provided or unavailable. Missing vitals are due to the limitations of the virtual format.: Unable to obtain vitals - no equipment If Telephone or Video please confirm:: I connected with patient using audio/video enable telemedicine. I verified patient identity with two identifiers, discussed telehealth limitations, and patient agreed to proceed. Patient Location:: home Provider Location:: office Interpreter Needed?: No Pre-visit prep was completed: yes AWV questionnaire completed by patient prior to visit?: no Living arrangements:: lives with spouse/significant other Patient's Overall Health Status Rating: good Typical amount of pain: none Does pain affect daily life?: no Are you currently prescribed opioids?: no  Dietary Habits and Nutritional Risks How many meals a day?: 2 Eats fruit and vegetables daily?: (!) no Most meals are obtained by: preparing own meals In the last 2 weeks, have you had any of the following?: none Diabetic:: no  Functional Status Activities of Daily Living (to include ambulation/medication): Independent Ambulation: Independent Medication Administration: Independent Home Management (perform basic housework or laundry): Independent Manage your own finances?: yes Primary transportation is: driving Concerns about vision?: no *vision screening is required for WTM* Concerns about hearing?: no  Fall Screening Falls in the past year?: 0 Number of falls in past year: 0 Was there an injury  with Fall?: 0 Fall Risk Category Calculator: 0 Patient Fall Risk Level: Low Fall Risk  Fall Risk Patient at Risk for Falls Due to: No Fall Risks Fall risk Follow up: Falls evaluation completed  Home and Transportation Safety: All rugs have non-skid backing?: yes All stairs or steps have railings?: yes Grab bars in the bathtub or shower?: (!) no Have non-skid surface in bathtub or shower?: yes Good home lighting?: yes Regular seat belt use?: yes Hospital stays in the last year:: no How many hospital stays:: (Patient-Rptd) 1  Cognitive Assessment Difficulty concentrating, remembering, or making decisions? : no Will 6CIT or Mini Cog be Completed: yes What year is it?: 0 points What month is it?: 0 points Give patient an address phrase to remember (5 components): [address] [place] what time is it?: 0 points Count backwards from 20 to 1: 0 points Say the months of the year in reverse: 0 points Repeat the address phrase from earlier: 0 points 6 CIT Score: 0 points  Advance Directives (For Healthcare) Does Patient Have a Medical Advance Directive?: No Would patient like information on creating a medical advance directive?: No - Patient declined    Allergies (verified) Nisoldipine, Augmentin [amoxicillin-pot clavulanate], and Sulfa antibiotics   Current Medications (verified) Outpatient Encounter Medications as of 08/05/2024  Medication Sig   acetaminophen  (TYLENOL ) 650 MG CR tablet Take 650 mg by mouth daily. Takes three tablets a few times a week. Does not take it every day.   albuterol  (VENTOLIN  HFA) 108 (90 Base) MCG/ACT inhaler Inhale 1-2 puffs into the lungs every 6 (six) hours as needed for wheezing or shortness of breath.   atorvastatin  (LIPITOR) 20 MG tablet Take 1 tablet (20 mg total) by mouth daily. (Patient taking differently: Take 40 mg by mouth daily.)  clindamycin (CLEOCIN) 300 MG capsule Take 300 mg by mouth every 6 (six) hours.   pantoprazole   (PROTONIX ) 40 MG tablet TAKE 1 TABLET BY MOUTH DAILY AS NEEDED   Vitamin D , Ergocalciferol , (DRISDOL ) 1.25 MG (50000 UNIT) CAPS capsule Take 1 capsule (50,000 Units total) by mouth 2 (two) times a week. Take on Tues & Fri   Facility-Administered Encounter Medications as of 08/05/2024  Medication   albuterol  (PROVENTIL ) (2.5 MG/3ML) 0.083% nebulizer solution 2.5 mg    History: Past Medical History:  Diagnosis Date   Allergy    Arthritis    hnp, low back , knees -  all OA   Complication of anesthesia    nausea one surgery more recent   History of kidney stones    Pneumonia    Feb.- Mar. 2017, not hospitalized, but pending surgery was cancelled ,since then, pt. seen by PCP, told that it was cleared     PONV (postoperative nausea and vomiting)    Past Surgical History:  Procedure Laterality Date   BACK SURGERY  08/14/2006   CERVICAL DISCECTOMY  08/15/2007   CESAREAN SECTION     x4   CYST EXCISION     vocal cords?   CYSTO  08/14/1997   stone   HARDWARE REMOVAL N/A 06/21/2015   Procedure: Exploration of Fusion and removal of hardware right thoracic level one;  Surgeon: Arley Helling, MD;  Location: [facility];  Service: Neurosurgery;  Laterality: N/A;   LAMINECTOMY WITH POSTERIOR LATERAL ARTHRODESIS LEVEL 2 N/A 03/04/2021   Procedure: posterior lateral fusion - Thoracic eleven-Thoracic twelve - Thoracic twelve-Lumbar one, Redo Lumbar One-Two Posterior Lumbar Interbody Fusion;  Surgeon: Helling Arley, MD;  Location[facility];  Service: Neurosurgery;  Laterality: N/A;   POSTERIOR CERVICAL FUSION/FORAMINOTOMY N/A 05/31/2015   Procedure: Decompression and Laminectomy Cervical three-four  Cervical six-seven  Cervical seven-thoracic-one Posterior cervical fusion with lateral mass fixation;  Surgeon: Arley Helling, MD;  Location: [facility];  Service: Neurosurgery;  Laterality: N/A;   SPINE SURGERY  2016   TONSILLECTOMY     TUBAL LIGATION     Family History  Problem Relation Age of Onset    Dementia Mother    Cancer Mother    Arthritis Father    Cancer Sister    Cancer Maternal Aunt    Social History   Occupational History   Occupation: retired  Tobacco Use   Smoking status: Every Day    Current packs/day: 0.25    Average packs/day: 0.3 packs/day for 32.0 years (8.0 ttl pk-yrs)    Types: Cigarettes   Smokeless tobacco: Current   Tobacco comments:    I know I have to tell myself this is what I want to do but I am not ready to do that yet  Vaping Use   Vaping status: Never Used  Substance and Sexual Activity   Alcohol use: No   Drug use: No   Sexual activity: Not Currently   Tobacco Counseling Ready to quit: Not Answered Counseling given: Not Answered Tobacco comments: I know I have to tell myself this is what I want to do but I am not ready to do that yet  SDOH Screenings   Food Insecurity: No Food Insecurity (08/05/2024)  Housing: Unknown (08/05/2024)  Transportation Needs: No Transportation Needs (08/05/2024)  Utilities: Not At Risk (08/05/2024)  Alcohol Screen: Low Risk (07/31/2023)  Depression (PHQ2-9): Low Risk (08/05/2024)  Financial Resource Strain: Low Risk (08/01/2024)  Physical Activity: Inactive (08/05/2024)  Social Connections: Moderately  Isolated (08/05/2024)  Stress: No Stress Concern Present (08/05/2024)  Tobacco Use: High Risk (05/12/2024)  Health Literacy: Adequate Health Literacy (08/05/2024)   See flowsheets for full screening details  Depression Screen PHQ 2 & 9 Depression Scale- Over the past 2 weeks, how often have you been bothered by any of the following problems? Little interest or pleasure in doing things: 0 Feeling down, depressed, or hopeless (PHQ Adolescent also includes...irritable): 0 PHQ-2 Total Score: 0 Trouble falling or staying asleep, or sleeping too much: 0 Feeling tired or having little energy: 0 Poor appetite or overeating (PHQ Adolescent also includes...weight loss): 0 Feeling bad about yourself - or that  you are a failure or have let yourself or your family down: 0 Trouble concentrating on things, such as reading the newspaper or watching television (PHQ Adolescent also includes...like school work): 0 Moving or speaking so slowly that other people could have noticed. Or the opposite - being so fidgety or restless that you have been moving around a lot more than usual: 0 Thoughts that you would be better off dead, or of hurting yourself in some way: 0 PHQ-9 Total Score: 0 If you checked off any problems, how difficult have these problems made it for you to do your work, take care of things at home, or get along with other people?: Not difficult at all     Goals Addressed             This Visit's Progress    Activity and Exercise Increased       Evidence-based guidance:  Review current exercise levels.  Assess patient perspective on exercise or activity level, barriers to increasing activity, motivation and readiness for change.  Recommend or set healthy exercise goal based on individual tolerance.  Encourage small steps toward making change in amount of exercise or activity.  Urge reduction of sedentary activities or screen time.  Promote group activities within the community or with family or support person.  Consider referral to rehabiliation therapist for assessment and exercise/activity plan.   Notes:              Objective:    Today's Vitals   There is no height or weight on file to calculate BMI.  Hearing/Vision screen No results found. Immunizations and Health Maintenance Health Maintenance  Topic Date Due   Zoster Vaccines- Shingrix  (1 of 2) Never done   COVID-19 Vaccine (3 - Pfizer risk series) 11/08/2019   Pneumococcal Vaccine: 50+ Years (2 of 2 - PCV) 10/28/2024 (Originally 05/10/2011)   Influenza Vaccine  11/11/2024 (Originally 03/14/2024)   Mammogram  04/18/2025   Medicare Annual Wellness (AWV)  08/05/2025   Colonoscopy  04/12/2032   DTaP/Tdap/Td (3 - Td or  Tdap) 03/14/2033   Bone Density Scan  Completed   Hepatitis C Screening  Completed   Meningococcal B Vaccine  Aged Out        Assessment/Plan:  This is a routine wellness examination for Julie Obrien.  Patient Care Team: Jarold Medici, MD as PCP - General  I have personally reviewed and noted the following in the patients chart:   Medical and social history Use of alcohol, tobacco or illicit drugs  Current medications and supplements including opioid prescriptions. Functional ability and status Nutritional status Physical activity Advanced directives List of other physicians Hospitalizations, surgeries, and ER visits in previous 12 months Vitals Screenings to include cognitive, depression, and falls Referrals and appointments  No orders of the defined types were placed in this encounter.  In addition, I have reviewed and discussed with patient certain preventive protocols, quality metrics, and best practice recommendations. A written personalized care plan for preventive services as well as general preventive health recommendations were provided to patient.   Shona List   08/05/2024   Return in 1 year (on 08/05/2025).  After Visit Summary: (Declined) Due to this being a telephonic visit, with patients personalized plan was offered to patient but patient Declined AVS at this time   Nurse Notes:  "

## 2024-08-05 NOTE — Patient Instructions (Signed)
 Julie Obrien,  Thank you for taking the time for your Medicare Wellness Visit. I appreciate your continued commitment to your health goals. Please review the care plan we discussed, and feel free to reach out if I can assist you further.  Please note that Annual Wellness Visits do not include a physical exam. Some assessments may be limited, especially if the visit was conducted virtually. If needed, we may recommend an in-person follow-up with your provider.  Ongoing Care Seeing your primary care provider every 3 to 6 months helps us  monitor your health and provide consistent, personalized care.   Referrals If a referral was made during today's visit and you haven't received any updates within two weeks, please contact the referred provider directly to check on the status.  Recommended Screenings:  Health Maintenance  Topic Date Due   Zoster (Shingles) Vaccine (1 of 2) Never done   COVID-19 Vaccine (3 - Pfizer risk series) 11/08/2019   Medicare Annual Wellness Visit  07/30/2024   Pneumococcal Vaccine for age over 57 (2 of 2 - PCV) 10/28/2024*   Flu Shot  11/11/2024*   Breast Cancer Screening  04/18/2025   Colon Cancer Screening  04/12/2032   DTaP/Tdap/Td vaccine (3 - Td or Tdap) 03/14/2033   Osteoporosis screening with Bone Density Scan  Completed   Hepatitis C Screening  Completed   Meningitis B Vaccine  Aged Out  *Topic was postponed. The date shown is not the original due date.       08/05/2024   11:44 AM  Advanced Directives  Does Patient Have a Medical Advance Directive? No  Would patient like information on creating a medical advance directive? No - Patient declined    Vision: Annual vision screenings are recommended for early detection of glaucoma, cataracts, and diabetic retinopathy. These exams can also reveal signs of chronic conditions such as diabetes and high blood pressure.  Dental: Annual dental screenings help detect early signs of oral cancer, gum disease, and  other conditions linked to overall health, including heart disease and diabetes.  Please see the attached documents for additional preventive care recommendations.

## 2024-08-13 ENCOUNTER — Encounter: Payer: Medicare HMO | Admitting: Family Medicine

## 2024-10-21 ENCOUNTER — Ambulatory Visit: Admitting: Diagnostic Neuroimaging
